# Patient Record
Sex: Male | Born: 1940
Health system: Southern US, Community
[De-identification: ages and names within clinical notes are randomized; demographics above are authoritative.]

## PROBLEM LIST (undated history)

## (undated) DIAGNOSIS — J189 Pneumonia, unspecified organism: Secondary | ICD-10-CM

## (undated) DIAGNOSIS — G4730 Sleep apnea, unspecified: Secondary | ICD-10-CM

## (undated) DIAGNOSIS — N19 Unspecified kidney failure: Secondary | ICD-10-CM

## (undated) DIAGNOSIS — Z5309 Procedure and treatment not carried out because of other contraindication: Secondary | ICD-10-CM

## (undated) DIAGNOSIS — E785 Hyperlipidemia, unspecified: Secondary | ICD-10-CM

## (undated) DIAGNOSIS — E7800 Pure hypercholesterolemia, unspecified: Secondary | ICD-10-CM

## (undated) DIAGNOSIS — N186 End stage renal disease: Secondary | ICD-10-CM

## (undated) DIAGNOSIS — Z862 Personal history of diseases of the blood and blood-forming organs and certain disorders involving the immune mechanism: Secondary | ICD-10-CM

## (undated) DIAGNOSIS — I10 Essential (primary) hypertension: Secondary | ICD-10-CM

## (undated) DIAGNOSIS — R0600 Dyspnea, unspecified: Secondary | ICD-10-CM

## (undated) DIAGNOSIS — K567 Ileus, unspecified: Secondary | ICD-10-CM

## (undated) DIAGNOSIS — I2510 Atherosclerotic heart disease of native coronary artery without angina pectoris: Secondary | ICD-10-CM

## (undated) DIAGNOSIS — J90 Pleural effusion, not elsewhere classified: Secondary | ICD-10-CM

## (undated) DIAGNOSIS — J61 Pneumoconiosis due to asbestos and other mineral fibers: Secondary | ICD-10-CM

## (undated) HISTORY — DX: Ileus, unspecified: K56.7

## (undated) HISTORY — PX: STERNOTOMY: SHX1057

## (undated) HISTORY — PX: PERCUTANEOUS CORONARY STENT INTERVENTION (PCI-S): SHX6016

## (undated) HISTORY — DX: Sleep apnea, unspecified: G47.30

## (undated) HISTORY — PX: CARPAL TUNNEL RELEASE: SHX101

## (undated) HISTORY — DX: Pleural effusion, not elsewhere classified: J90

## (undated) HISTORY — DX: Procedure and treatment not carried out because of other contraindication: Z53.09

## (undated) HISTORY — DX: Unspecified kidney failure: N19

## (undated) HISTORY — DX: Atherosclerotic heart disease of native coronary artery without angina pectoris: I25.10

## (undated) HISTORY — PX: CAROTID ENDARTERECTOMY: SUR193

## (undated) HISTORY — DX: Pneumoconiosis due to asbestos and other mineral fibers: J61

## (undated) HISTORY — DX: Pure hypercholesterolemia, unspecified: E78.00

## (undated) HISTORY — PX: HERNIA REPAIR: SHX51

## (undated) HISTORY — PX: CARDIAC CATHETERIZATION: SHX172

## (undated) HISTORY — PX: BACK SURGERY: SHX140

## (undated) HISTORY — DX: Hyperlipidemia, unspecified: E78.5

## (undated) HISTORY — PX: CORONARY ARTERY BYPASS GRAFT: SHX141

## (undated) HISTORY — DX: Essential (primary) hypertension: I10

---

## 2015-03-16 DIAGNOSIS — I2510 Atherosclerotic heart disease of native coronary artery without angina pectoris: Secondary | ICD-10-CM | POA: Diagnosis not present

## 2015-03-16 DIAGNOSIS — R079 Chest pain, unspecified: Secondary | ICD-10-CM | POA: Diagnosis not present

## 2015-03-16 DIAGNOSIS — I10 Essential (primary) hypertension: Secondary | ICD-10-CM | POA: Diagnosis not present

## 2015-03-16 DIAGNOSIS — J069 Acute upper respiratory infection, unspecified: Secondary | ICD-10-CM | POA: Diagnosis not present

## 2015-03-21 DIAGNOSIS — H2513 Age-related nuclear cataract, bilateral: Secondary | ICD-10-CM | POA: Diagnosis not present

## 2015-03-26 DIAGNOSIS — E039 Hypothyroidism, unspecified: Secondary | ICD-10-CM | POA: Diagnosis not present

## 2015-03-26 DIAGNOSIS — D649 Anemia, unspecified: Secondary | ICD-10-CM | POA: Diagnosis not present

## 2015-03-26 DIAGNOSIS — I10 Essential (primary) hypertension: Secondary | ICD-10-CM | POA: Diagnosis not present

## 2015-03-26 DIAGNOSIS — E785 Hyperlipidemia, unspecified: Secondary | ICD-10-CM | POA: Diagnosis not present

## 2015-03-27 DIAGNOSIS — N183 Chronic kidney disease, stage 3 (moderate): Secondary | ICD-10-CM | POA: Diagnosis not present

## 2015-03-27 DIAGNOSIS — Z951 Presence of aortocoronary bypass graft: Secondary | ICD-10-CM | POA: Diagnosis not present

## 2015-03-27 DIAGNOSIS — J61 Pneumoconiosis due to asbestos and other mineral fibers: Secondary | ICD-10-CM | POA: Diagnosis not present

## 2015-03-27 DIAGNOSIS — G4733 Obstructive sleep apnea (adult) (pediatric): Secondary | ICD-10-CM | POA: Diagnosis not present

## 2015-04-11 DIAGNOSIS — M4806 Spinal stenosis, lumbar region: Secondary | ICD-10-CM | POA: Diagnosis not present

## 2015-04-11 DIAGNOSIS — S335XXD Sprain of ligaments of lumbar spine, subsequent encounter: Secondary | ICD-10-CM | POA: Diagnosis not present

## 2015-04-11 DIAGNOSIS — M5136 Other intervertebral disc degeneration, lumbar region: Secondary | ICD-10-CM | POA: Diagnosis not present

## 2015-04-16 DIAGNOSIS — R531 Weakness: Secondary | ICD-10-CM | POA: Diagnosis not present

## 2015-04-16 DIAGNOSIS — M545 Low back pain: Secondary | ICD-10-CM | POA: Diagnosis not present

## 2015-04-16 DIAGNOSIS — Z4789 Encounter for other orthopedic aftercare: Secondary | ICD-10-CM | POA: Diagnosis not present

## 2015-04-18 DIAGNOSIS — Z4789 Encounter for other orthopedic aftercare: Secondary | ICD-10-CM | POA: Diagnosis not present

## 2015-04-18 DIAGNOSIS — R531 Weakness: Secondary | ICD-10-CM | POA: Diagnosis not present

## 2015-04-18 DIAGNOSIS — M545 Low back pain: Secondary | ICD-10-CM | POA: Diagnosis not present

## 2015-04-19 DIAGNOSIS — E876 Hypokalemia: Secondary | ICD-10-CM | POA: Diagnosis not present

## 2015-04-19 DIAGNOSIS — I129 Hypertensive chronic kidney disease with stage 1 through stage 4 chronic kidney disease, or unspecified chronic kidney disease: Secondary | ICD-10-CM | POA: Diagnosis not present

## 2015-04-19 DIAGNOSIS — N183 Chronic kidney disease, stage 3 (moderate): Secondary | ICD-10-CM | POA: Diagnosis not present

## 2015-04-19 DIAGNOSIS — R809 Proteinuria, unspecified: Secondary | ICD-10-CM | POA: Diagnosis not present

## 2015-04-20 DIAGNOSIS — M545 Low back pain: Secondary | ICD-10-CM | POA: Diagnosis not present

## 2015-04-20 DIAGNOSIS — Z4789 Encounter for other orthopedic aftercare: Secondary | ICD-10-CM | POA: Diagnosis not present

## 2015-04-20 DIAGNOSIS — R531 Weakness: Secondary | ICD-10-CM | POA: Diagnosis not present

## 2015-04-23 DIAGNOSIS — Z4789 Encounter for other orthopedic aftercare: Secondary | ICD-10-CM | POA: Diagnosis not present

## 2015-04-23 DIAGNOSIS — R531 Weakness: Secondary | ICD-10-CM | POA: Diagnosis not present

## 2015-04-23 DIAGNOSIS — M545 Low back pain: Secondary | ICD-10-CM | POA: Diagnosis not present

## 2015-04-25 DIAGNOSIS — M545 Low back pain: Secondary | ICD-10-CM | POA: Diagnosis not present

## 2015-04-25 DIAGNOSIS — Z4789 Encounter for other orthopedic aftercare: Secondary | ICD-10-CM | POA: Diagnosis not present

## 2015-04-25 DIAGNOSIS — R531 Weakness: Secondary | ICD-10-CM | POA: Diagnosis not present

## 2015-04-27 DIAGNOSIS — M545 Low back pain: Secondary | ICD-10-CM | POA: Diagnosis not present

## 2015-04-27 DIAGNOSIS — R531 Weakness: Secondary | ICD-10-CM | POA: Diagnosis not present

## 2015-04-27 DIAGNOSIS — Z4789 Encounter for other orthopedic aftercare: Secondary | ICD-10-CM | POA: Diagnosis not present

## 2015-04-30 DIAGNOSIS — M545 Low back pain: Secondary | ICD-10-CM | POA: Diagnosis not present

## 2015-04-30 DIAGNOSIS — R531 Weakness: Secondary | ICD-10-CM | POA: Diagnosis not present

## 2015-04-30 DIAGNOSIS — Z4789 Encounter for other orthopedic aftercare: Secondary | ICD-10-CM | POA: Diagnosis not present

## 2015-05-02 DIAGNOSIS — Z4789 Encounter for other orthopedic aftercare: Secondary | ICD-10-CM | POA: Diagnosis not present

## 2015-05-02 DIAGNOSIS — M545 Low back pain: Secondary | ICD-10-CM | POA: Diagnosis not present

## 2015-05-02 DIAGNOSIS — R531 Weakness: Secondary | ICD-10-CM | POA: Diagnosis not present

## 2015-05-04 DIAGNOSIS — M545 Low back pain: Secondary | ICD-10-CM | POA: Diagnosis not present

## 2015-05-04 DIAGNOSIS — Z4789 Encounter for other orthopedic aftercare: Secondary | ICD-10-CM | POA: Diagnosis not present

## 2015-05-04 DIAGNOSIS — R531 Weakness: Secondary | ICD-10-CM | POA: Diagnosis not present

## 2015-05-07 DIAGNOSIS — Z4789 Encounter for other orthopedic aftercare: Secondary | ICD-10-CM | POA: Diagnosis not present

## 2015-05-07 DIAGNOSIS — R531 Weakness: Secondary | ICD-10-CM | POA: Diagnosis not present

## 2015-05-07 DIAGNOSIS — M545 Low back pain: Secondary | ICD-10-CM | POA: Diagnosis not present

## 2015-05-09 DIAGNOSIS — R531 Weakness: Secondary | ICD-10-CM | POA: Diagnosis not present

## 2015-05-09 DIAGNOSIS — Z4789 Encounter for other orthopedic aftercare: Secondary | ICD-10-CM | POA: Diagnosis not present

## 2015-05-09 DIAGNOSIS — M545 Low back pain: Secondary | ICD-10-CM | POA: Diagnosis not present

## 2015-05-11 DIAGNOSIS — M545 Low back pain: Secondary | ICD-10-CM | POA: Diagnosis not present

## 2015-05-11 DIAGNOSIS — R531 Weakness: Secondary | ICD-10-CM | POA: Diagnosis not present

## 2015-05-11 DIAGNOSIS — Z4789 Encounter for other orthopedic aftercare: Secondary | ICD-10-CM | POA: Diagnosis not present

## 2015-05-21 DIAGNOSIS — N183 Chronic kidney disease, stage 3 (moderate): Secondary | ICD-10-CM | POA: Diagnosis not present

## 2015-05-28 DIAGNOSIS — H47233 Glaucomatous optic atrophy, bilateral: Secondary | ICD-10-CM | POA: Diagnosis not present

## 2015-06-20 DIAGNOSIS — M4806 Spinal stenosis, lumbar region: Secondary | ICD-10-CM | POA: Diagnosis not present

## 2015-06-20 DIAGNOSIS — M5136 Other intervertebral disc degeneration, lumbar region: Secondary | ICD-10-CM | POA: Diagnosis not present

## 2015-06-20 DIAGNOSIS — S335XXD Sprain of ligaments of lumbar spine, subsequent encounter: Secondary | ICD-10-CM | POA: Diagnosis not present

## 2015-06-26 DIAGNOSIS — M79642 Pain in left hand: Secondary | ICD-10-CM | POA: Diagnosis not present

## 2015-06-26 DIAGNOSIS — M12542 Traumatic arthropathy, left hand: Secondary | ICD-10-CM | POA: Diagnosis not present

## 2015-06-26 DIAGNOSIS — S63413A Traumatic rupture of collateral ligament of left middle finger at metacarpophalangeal and interphalangeal joint, initial encounter: Secondary | ICD-10-CM | POA: Diagnosis not present

## 2015-07-02 DIAGNOSIS — H2513 Age-related nuclear cataract, bilateral: Secondary | ICD-10-CM | POA: Diagnosis not present

## 2015-07-02 DIAGNOSIS — H401132 Primary open-angle glaucoma, bilateral, moderate stage: Secondary | ICD-10-CM | POA: Diagnosis not present

## 2015-07-05 DIAGNOSIS — M19042 Primary osteoarthritis, left hand: Secondary | ICD-10-CM | POA: Diagnosis not present

## 2015-07-06 DIAGNOSIS — J449 Chronic obstructive pulmonary disease, unspecified: Secondary | ICD-10-CM | POA: Diagnosis not present

## 2015-07-15 DIAGNOSIS — M4806 Spinal stenosis, lumbar region: Secondary | ICD-10-CM | POA: Diagnosis not present

## 2015-07-15 DIAGNOSIS — M79604 Pain in right leg: Secondary | ICD-10-CM | POA: Diagnosis not present

## 2015-07-15 DIAGNOSIS — M5432 Sciatica, left side: Secondary | ICD-10-CM | POA: Diagnosis not present

## 2015-07-15 DIAGNOSIS — Z9889 Other specified postprocedural states: Secondary | ICD-10-CM | POA: Diagnosis not present

## 2015-07-15 DIAGNOSIS — Z720 Tobacco use: Secondary | ICD-10-CM | POA: Diagnosis not present

## 2015-07-15 DIAGNOSIS — E7800 Pure hypercholesterolemia, unspecified: Secondary | ICD-10-CM | POA: Diagnosis not present

## 2015-07-15 DIAGNOSIS — I2510 Atherosclerotic heart disease of native coronary artery without angina pectoris: Secondary | ICD-10-CM | POA: Diagnosis not present

## 2015-07-15 DIAGNOSIS — M79605 Pain in left leg: Secondary | ICD-10-CM | POA: Diagnosis not present

## 2015-07-15 DIAGNOSIS — I10 Essential (primary) hypertension: Secondary | ICD-10-CM | POA: Diagnosis not present

## 2015-07-15 DIAGNOSIS — N400 Enlarged prostate without lower urinary tract symptoms: Secondary | ICD-10-CM | POA: Diagnosis not present

## 2015-07-18 DIAGNOSIS — M545 Low back pain: Secondary | ICD-10-CM | POA: Diagnosis not present

## 2015-07-18 DIAGNOSIS — M5432 Sciatica, left side: Secondary | ICD-10-CM | POA: Diagnosis not present

## 2015-07-18 DIAGNOSIS — S335XXD Sprain of ligaments of lumbar spine, subsequent encounter: Secondary | ICD-10-CM | POA: Diagnosis not present

## 2015-07-18 DIAGNOSIS — M5137 Other intervertebral disc degeneration, lumbosacral region: Secondary | ICD-10-CM | POA: Diagnosis not present

## 2015-07-18 DIAGNOSIS — M5136 Other intervertebral disc degeneration, lumbar region: Secondary | ICD-10-CM | POA: Diagnosis not present

## 2015-07-20 DIAGNOSIS — M79605 Pain in left leg: Secondary | ICD-10-CM | POA: Diagnosis not present

## 2015-07-20 DIAGNOSIS — S335XXD Sprain of ligaments of lumbar spine, subsequent encounter: Secondary | ICD-10-CM | POA: Diagnosis not present

## 2015-07-20 DIAGNOSIS — M545 Low back pain: Secondary | ICD-10-CM | POA: Diagnosis not present

## 2015-07-24 DIAGNOSIS — G4733 Obstructive sleep apnea (adult) (pediatric): Secondary | ICD-10-CM | POA: Diagnosis not present

## 2015-07-24 DIAGNOSIS — R079 Chest pain, unspecified: Secondary | ICD-10-CM | POA: Diagnosis not present

## 2015-07-24 DIAGNOSIS — I10 Essential (primary) hypertension: Secondary | ICD-10-CM | POA: Diagnosis not present

## 2015-07-24 DIAGNOSIS — I2510 Atherosclerotic heart disease of native coronary artery without angina pectoris: Secondary | ICD-10-CM | POA: Diagnosis not present

## 2015-07-25 DIAGNOSIS — M5136 Other intervertebral disc degeneration, lumbar region: Secondary | ICD-10-CM | POA: Diagnosis not present

## 2015-07-25 DIAGNOSIS — M5432 Sciatica, left side: Secondary | ICD-10-CM | POA: Diagnosis not present

## 2015-07-25 DIAGNOSIS — S335XXD Sprain of ligaments of lumbar spine, subsequent encounter: Secondary | ICD-10-CM | POA: Diagnosis not present

## 2015-07-25 DIAGNOSIS — M5137 Other intervertebral disc degeneration, lumbosacral region: Secondary | ICD-10-CM | POA: Diagnosis not present

## 2015-08-08 DIAGNOSIS — M5416 Radiculopathy, lumbar region: Secondary | ICD-10-CM | POA: Diagnosis not present

## 2015-08-24 DIAGNOSIS — M5416 Radiculopathy, lumbar region: Secondary | ICD-10-CM | POA: Diagnosis not present

## 2015-09-10 DIAGNOSIS — M5416 Radiculopathy, lumbar region: Secondary | ICD-10-CM | POA: Diagnosis not present

## 2015-10-05 DIAGNOSIS — H2513 Age-related nuclear cataract, bilateral: Secondary | ICD-10-CM | POA: Diagnosis not present

## 2015-10-08 DIAGNOSIS — I10 Essential (primary) hypertension: Secondary | ICD-10-CM | POA: Diagnosis not present

## 2015-10-08 DIAGNOSIS — M109 Gout, unspecified: Secondary | ICD-10-CM | POA: Diagnosis not present

## 2015-10-08 DIAGNOSIS — I2510 Atherosclerotic heart disease of native coronary artery without angina pectoris: Secondary | ICD-10-CM | POA: Diagnosis not present

## 2015-10-10 DIAGNOSIS — R079 Chest pain, unspecified: Secondary | ICD-10-CM | POA: Diagnosis not present

## 2015-10-26 DIAGNOSIS — I10 Essential (primary) hypertension: Secondary | ICD-10-CM | POA: Diagnosis not present

## 2015-10-26 DIAGNOSIS — Z23 Encounter for immunization: Secondary | ICD-10-CM | POA: Diagnosis not present

## 2015-10-26 DIAGNOSIS — I2510 Atherosclerotic heart disease of native coronary artery without angina pectoris: Secondary | ICD-10-CM | POA: Diagnosis not present

## 2015-11-02 DIAGNOSIS — E875 Hyperkalemia: Secondary | ICD-10-CM | POA: Diagnosis not present

## 2015-11-02 DIAGNOSIS — Z951 Presence of aortocoronary bypass graft: Secondary | ICD-10-CM | POA: Diagnosis not present

## 2015-11-02 DIAGNOSIS — I2510 Atherosclerotic heart disease of native coronary artery without angina pectoris: Secondary | ICD-10-CM | POA: Diagnosis not present

## 2015-11-02 DIAGNOSIS — R079 Chest pain, unspecified: Secondary | ICD-10-CM | POA: Diagnosis not present

## 2015-11-05 DIAGNOSIS — R079 Chest pain, unspecified: Secondary | ICD-10-CM | POA: Diagnosis not present

## 2015-11-07 DIAGNOSIS — R079 Chest pain, unspecified: Secondary | ICD-10-CM | POA: Diagnosis not present

## 2015-11-13 DIAGNOSIS — I2510 Atherosclerotic heart disease of native coronary artery without angina pectoris: Secondary | ICD-10-CM | POA: Diagnosis not present

## 2015-11-13 DIAGNOSIS — E7800 Pure hypercholesterolemia, unspecified: Secondary | ICD-10-CM | POA: Diagnosis not present

## 2015-11-13 DIAGNOSIS — N183 Chronic kidney disease, stage 3 (moderate): Secondary | ICD-10-CM | POA: Diagnosis not present

## 2015-11-13 DIAGNOSIS — I10 Essential (primary) hypertension: Secondary | ICD-10-CM | POA: Diagnosis not present

## 2015-11-13 DIAGNOSIS — Z87891 Personal history of nicotine dependence: Secondary | ICD-10-CM | POA: Diagnosis not present

## 2015-11-13 DIAGNOSIS — E782 Mixed hyperlipidemia: Secondary | ICD-10-CM | POA: Diagnosis not present

## 2015-11-13 DIAGNOSIS — I129 Hypertensive chronic kidney disease with stage 1 through stage 4 chronic kidney disease, or unspecified chronic kidney disease: Secondary | ICD-10-CM | POA: Diagnosis not present

## 2015-11-13 DIAGNOSIS — Z955 Presence of coronary angioplasty implant and graft: Secondary | ICD-10-CM | POA: Diagnosis not present

## 2015-11-14 DIAGNOSIS — I10 Essential (primary) hypertension: Secondary | ICD-10-CM | POA: Diagnosis not present

## 2015-11-14 DIAGNOSIS — E782 Mixed hyperlipidemia: Secondary | ICD-10-CM | POA: Diagnosis not present

## 2015-11-14 DIAGNOSIS — N183 Chronic kidney disease, stage 3 (moderate): Secondary | ICD-10-CM | POA: Diagnosis not present

## 2015-11-14 DIAGNOSIS — I129 Hypertensive chronic kidney disease with stage 1 through stage 4 chronic kidney disease, or unspecified chronic kidney disease: Secondary | ICD-10-CM | POA: Diagnosis not present

## 2015-11-14 DIAGNOSIS — I2510 Atherosclerotic heart disease of native coronary artery without angina pectoris: Secondary | ICD-10-CM | POA: Diagnosis not present

## 2015-11-14 DIAGNOSIS — I25118 Atherosclerotic heart disease of native coronary artery with other forms of angina pectoris: Secondary | ICD-10-CM | POA: Diagnosis not present

## 2015-11-14 DIAGNOSIS — E785 Hyperlipidemia, unspecified: Secondary | ICD-10-CM | POA: Diagnosis not present

## 2015-11-14 DIAGNOSIS — E7800 Pure hypercholesterolemia, unspecified: Secondary | ICD-10-CM | POA: Diagnosis not present

## 2015-11-20 DIAGNOSIS — G4733 Obstructive sleep apnea (adult) (pediatric): Secondary | ICD-10-CM | POA: Diagnosis not present

## 2015-11-20 DIAGNOSIS — I129 Hypertensive chronic kidney disease with stage 1 through stage 4 chronic kidney disease, or unspecified chronic kidney disease: Secondary | ICD-10-CM | POA: Diagnosis not present

## 2015-11-20 DIAGNOSIS — N183 Chronic kidney disease, stage 3 (moderate): Secondary | ICD-10-CM | POA: Diagnosis not present

## 2015-11-20 DIAGNOSIS — E875 Hyperkalemia: Secondary | ICD-10-CM | POA: Diagnosis not present

## 2015-11-20 DIAGNOSIS — I10 Essential (primary) hypertension: Secondary | ICD-10-CM | POA: Diagnosis not present

## 2015-11-20 DIAGNOSIS — Z951 Presence of aortocoronary bypass graft: Secondary | ICD-10-CM | POA: Diagnosis not present

## 2015-11-20 DIAGNOSIS — J61 Pneumoconiosis due to asbestos and other mineral fibers: Secondary | ICD-10-CM | POA: Diagnosis not present

## 2015-11-21 DIAGNOSIS — M5416 Radiculopathy, lumbar region: Secondary | ICD-10-CM | POA: Diagnosis not present

## 2015-12-10 DIAGNOSIS — N183 Chronic kidney disease, stage 3 (moderate): Secondary | ICD-10-CM | POA: Diagnosis not present

## 2016-03-11 DIAGNOSIS — M109 Gout, unspecified: Secondary | ICD-10-CM | POA: Diagnosis not present

## 2016-03-11 DIAGNOSIS — E785 Hyperlipidemia, unspecified: Secondary | ICD-10-CM | POA: Diagnosis not present

## 2016-03-11 DIAGNOSIS — N400 Enlarged prostate without lower urinary tract symptoms: Secondary | ICD-10-CM | POA: Diagnosis not present

## 2016-03-11 DIAGNOSIS — H409 Unspecified glaucoma: Secondary | ICD-10-CM | POA: Diagnosis not present

## 2016-03-11 DIAGNOSIS — J61 Pneumoconiosis due to asbestos and other mineral fibers: Secondary | ICD-10-CM | POA: Diagnosis not present

## 2016-03-11 DIAGNOSIS — N183 Chronic kidney disease, stage 3 (moderate): Secondary | ICD-10-CM | POA: Diagnosis not present

## 2016-03-11 DIAGNOSIS — I2510 Atherosclerotic heart disease of native coronary artery without angina pectoris: Secondary | ICD-10-CM | POA: Diagnosis not present

## 2016-03-16 NOTE — Progress Notes (Addendum)
Cardiology Office Note   Date:  03/17/2016   ID:  Peter Oneill, DOB February 26, 1940, MRN [id number]  PCP:  Peter Kid, MD    Chief Complaint  Patient presents with  . Establish Care     Wt Readings from Last 3 Encounters:  03/17/16 213 lb (96.6 kg)       History of Present Illness: Peter Oneill is a 76 y.o. male  Who has had extensive CAD.  She has had CABG in 2003 after having burning in his chest.  A few years later, he required several stents.  Details are not available at this time.  He required repeat CABG in 12/16, with RIMA to LAD and SVG to diagonal.  Last cath was 10/17 in which a stent was placed.  THat cath was done at [facility].    Additional anatomic information from prior records from 2015 cath: LIMA to LAD is atretic. LAD filled by SVG to diagonal prior to second bypass surgery in 2016. SVG to circumflex is occluded. This information comes from a cardiac cath report from 2015. At that time, the RCA was stented with a 3.5 x 15 Xience drug-eluting stent.  He also has a 3.5 x 22 resolute stent in the circumflex.  He had CEA in 1996 and has not had a carotid Doppler in a few years.     He has not had any angina of late.  THey have moved to [place] from [place] recently.    Exercise is limited by [facility] from asbestosis.    No bleeding problems on the Plavix.   Tries to eat healthy.      Past Medical History:  Diagnosis Date  . Asbestosis ([place])   . Contraindication to percutaneous coronary intervention (PCI)   . Coronary artery disease   . Dyslipidemia   . Hypercholesterolemia   . Hypertension   . Ileus ([place])   . Pleural effusion on left   . Renal failure   . Sleep apnea     Past Surgical History:  Procedure Laterality Date  . BACK SURGERY    . CARDIAC CATHETERIZATION    . CAROTID ENDARTERECTOMY    . CARPAL TUNNEL RELEASE    . CORONARY ARTERY BYPASS GRAFT    . HERNIA REPAIR    . PERCUTANEOUS CORONARY STENT INTERVENTION (PCI-S)     multiple  .  STERNOTOMY       Current Outpatient Prescriptions  Medication Sig Dispense Refill  . ALLOPURINOL PO Take 300 mg by mouth daily.     . ASPIRIN EC PO Take 81 mg by mouth daily.     . brimonidine (ALPHAGAN P) 0.1 % SOLN Place 1 drop into both eyes 2 (two) times daily.     Marland Kitchen CARVEDILOL PO Take 12.5 mg by mouth 2 (two) times daily.     . clopidogrel (PLAVIX) 75 MG tablet Take 75 mg by mouth daily.    . dorzolamide-timolol (COSOPT) 22.3-6.8 MG/ML ophthalmic solution Place 1 drop into both eyes 2 (two) times daily.    Marland Kitchen ezetimibe (ZETIA) 10 MG tablet Take 10 mg by mouth daily.    Marland Kitchen latanoprost (XALATAN) 0.005 % ophthalmic solution Place 1 drop into both eyes at bedtime.    . Multiple Vitamin (MULTIVITAMIN) tablet Take 1 tablet by mouth daily.    . Rosuvastatin Calcium (CRESTOR PO) Take 5 mg by mouth daily.     . Silodosin (RAPAFLO PO) Take 8 mg by mouth daily.      No current  facility-administered medications for this visit.     Allergies:   Patient has no known allergies.    Social History:  The patient  reports that he has quit smoking. He has never used smokeless tobacco. He reports that he drinks alcohol. He reports that he does not use drugs.   Family History:  The patient's family history includes Heart disease in his father.    ROS:  Please see the history of present illness.   Otherwise, review of systems are positive for DOE.   All other systems are reviewed and negative.    PHYSICAL EXAM: VS:  BP 136/80   Pulse (!) 55   Ht 5\' 9"  (1.753 m)   Wt 213 lb (96.6 kg)   SpO2 98%   BMI 31.45 kg/m  , BMI Body mass index is 31.45 kg/m. GEN: Well nourished, well developed, in no acute distress  HEENT: normal  Neck: no JVD, carotid bruits, or masses Cardiac: RRR; no murmurs, rubs, or gallops,no edema  Respiratory:  clear to auscultation bilaterally, normal work of breathing GI: soft, nontender, nondistended, + BS MS: no deformity or atrophy  Skin: warm and dry, no rash Neuro:   Strength and sensation are intact Psych: euthymic mood, full affect   EKG:   The ekg ordered today demonstrates NSR, prior inferior MI, no ST segment changes   Recent Labs: No results found for requested labs within last 8760 hours.   Lipid Panel No results found for: CHOL, TRIG, HDL, CHOLHDL, VLDL, LDLCALC, LDLDIRECT   Other studies Reviewed: Additional studies/ records that were reviewed today with results demonstrating: Hospital records from [place] reviewed.  Cr. 1.5 at [place] recently.  .   ASSESSMENT AND PLAN:  1. CAD: Status post CABG 2.  First CABG in 2003. Second CABG in 2016. Most recent PCI was a stent to the mid circumflex with a 3.0 x 12 resolute in September 2017, with kissing balloon to the obtuse marginal which originates from the stented segment. RCA was patent with multiple stents. RIMA to the LAD was patent. Vein graft to the diagonal was patent. It appears that there are 2 vein grafts to the same diagonal. There is also a LIMA to LAD from the first bypass. The 2 latest bypass grafts are the RIMA to LAD and SVG to diagonal.  Continue clopidogrel.  Prescription for supplemental nitroglycerin given. 2. Carotid disease: Status post carotid endarterectomy. We'll plan for carotid duplex. 3. Hyperlipidemia: Continue rosuvastatin. Will check lipids and liver tests today. 4. Chronic renal insufficiency: Creatinine 1.5.   Additional anatomic information from prior records: LIMA to LAD is atretic. LAD filled by SVG to diagonal prior to second bypass surgery in 2016. SVG to circumflex is occluded. This information comes from a cardiac cath report from 2015. At that time, the RCA was stented with a 3.5 x 15 Xience drug-eluting stent.  He also has a 3.5 x 22 resolute stent in the circumflex.  Current medicines are reviewed at length with the patient today.  The patient concerns regarding his medicines were addressed.  The following changes have been made:  No change  Labs/ tests  ordered today include:   Orders Placed This Encounter  Procedures  . EKG 12-Lead    Recommend 150 minutes/week of aerobic exercise Low fat, low carb, high fiber diet recommended  Disposition:   FU in 6 months   Signed, Larae Grooms, MD  03/17/2016 11:55 AM    [facility]  [address] [place], [place]  [address] Phone: [phone]; Fax: [phone]

## 2016-03-17 ENCOUNTER — Encounter: Payer: Self-pay | Admitting: Interventional Cardiology

## 2016-03-17 ENCOUNTER — Ambulatory Visit (INDEPENDENT_AMBULATORY_CARE_PROVIDER_SITE_OTHER): Payer: Medicare Other | Admitting: Interventional Cardiology

## 2016-03-17 VITALS — BP 136/80 | HR 55 | Ht 69.0 in | Wt 213.0 lb

## 2016-03-17 DIAGNOSIS — N183 Chronic kidney disease, stage 3 unspecified: Secondary | ICD-10-CM

## 2016-03-17 DIAGNOSIS — I2510 Atherosclerotic heart disease of native coronary artery without angina pectoris: Secondary | ICD-10-CM | POA: Diagnosis not present

## 2016-03-17 DIAGNOSIS — Z7689 Persons encountering health services in other specified circumstances: Secondary | ICD-10-CM

## 2016-03-17 DIAGNOSIS — E785 Hyperlipidemia, unspecified: Secondary | ICD-10-CM | POA: Insufficient documentation

## 2016-03-17 DIAGNOSIS — E782 Mixed hyperlipidemia: Secondary | ICD-10-CM | POA: Diagnosis not present

## 2016-03-17 DIAGNOSIS — I779 Disorder of arteries and arterioles, unspecified: Secondary | ICD-10-CM | POA: Diagnosis not present

## 2016-03-17 DIAGNOSIS — I739 Peripheral vascular disease, unspecified: Secondary | ICD-10-CM

## 2016-03-17 MED ORDER — NITROGLYCERIN 0.4 MG SL SUBL: 0.4000 mg | SUBLINGUAL_TABLET | SUBLINGUAL | 3 refills | Status: DC | PRN

## 2016-03-17 NOTE — Patient Instructions (Signed)
Medication Instructions:  Same-no changes. RX for Nitroglycerin has been sent to [facility] to fill.  Labwork: Today-Lipids and LFTS  Testing/Procedures: Your physician has requested that you have a carotid duplex. This test is an ultrasound of the carotid arteries in your neck. It looks at blood flow through these arteries that supply the brain with blood. Allow one hour for this exam. There are no restrictions or special instructions.   Follow-Up: Your physician wants you to follow-up in: 6 months. You will receive a reminder letter in the mail two months in advance. If you don't receive a letter, please call our office to schedule the follow-up appointment.     If you need a refill on your cardiac medications before your next appointment, please call your pharmacy.

## 2016-03-18 LAB — HEPATIC FUNCTION PANEL
ALBUMIN: 4 g/dL (ref 3.5–4.8)
ALT: 16 IU/L (ref 0–44)
AST: 20 IU/L (ref 0–40)
Alkaline Phosphatase: 110 IU/L (ref 39–117)
BILIRUBIN TOTAL: 0.4 mg/dL (ref 0.0–1.2)
Bilirubin, Direct: 0.13 mg/dL (ref 0.00–0.40)
TOTAL PROTEIN: 6.6 g/dL (ref 6.0–8.5)

## 2016-03-18 LAB — LIPID PANEL
Chol/HDL Ratio: 3 ratio units (ref 0.0–5.0)
Cholesterol, Total: 125 mg/dL (ref 100–199)
HDL: 41 mg/dL (ref >39)
LDL CALC: 66 mg/dL (ref 0–99)
Triglycerides: 92 mg/dL (ref 0–149)
VLDL Cholesterol Cal: 18 mg/dL (ref 5–40)

## 2016-03-19 ENCOUNTER — Encounter: Payer: Self-pay | Admitting: Interventional Cardiology

## 2016-03-19 ENCOUNTER — Ambulatory Visit (HOSPITAL_COMMUNITY)
Admission: RE | Admit: 2016-03-19 | Discharge: 2016-03-19 | Disposition: A | Discharge status: Home / Self Care | Payer: Medicare Other | Source: Ambulatory Visit | Attending: Cardiovascular Disease | Admitting: Cardiovascular Disease

## 2016-03-19 DIAGNOSIS — I739 Peripheral vascular disease, unspecified: Secondary | ICD-10-CM

## 2016-03-19 DIAGNOSIS — I6523 Occlusion and stenosis of bilateral carotid arteries: Secondary | ICD-10-CM | POA: Insufficient documentation

## 2016-03-19 DIAGNOSIS — I779 Disorder of arteries and arterioles, unspecified: Secondary | ICD-10-CM | POA: Diagnosis not present

## 2016-03-21 DIAGNOSIS — H401131 Primary open-angle glaucoma, bilateral, mild stage: Secondary | ICD-10-CM | POA: Diagnosis not present

## 2016-06-10 DIAGNOSIS — R600 Localized edema: Secondary | ICD-10-CM | POA: Diagnosis not present

## 2016-06-10 DIAGNOSIS — M109 Gout, unspecified: Secondary | ICD-10-CM | POA: Diagnosis not present

## 2016-06-10 DIAGNOSIS — J61 Pneumoconiosis due to asbestos and other mineral fibers: Secondary | ICD-10-CM | POA: Diagnosis not present

## 2016-06-10 DIAGNOSIS — N400 Enlarged prostate without lower urinary tract symptoms: Secondary | ICD-10-CM | POA: Diagnosis not present

## 2016-06-10 DIAGNOSIS — N183 Chronic kidney disease, stage 3 (moderate): Secondary | ICD-10-CM | POA: Diagnosis not present

## 2016-06-10 DIAGNOSIS — E785 Hyperlipidemia, unspecified: Secondary | ICD-10-CM | POA: Diagnosis not present

## 2016-06-13 DIAGNOSIS — H401131 Primary open-angle glaucoma, bilateral, mild stage: Secondary | ICD-10-CM | POA: Diagnosis not present

## 2016-08-19 DIAGNOSIS — J069 Acute upper respiratory infection, unspecified: Secondary | ICD-10-CM | POA: Diagnosis not present

## 2016-08-19 DIAGNOSIS — J61 Pneumoconiosis due to asbestos and other mineral fibers: Secondary | ICD-10-CM | POA: Diagnosis not present

## 2016-09-17 DIAGNOSIS — H401131 Primary open-angle glaucoma, bilateral, mild stage: Secondary | ICD-10-CM | POA: Diagnosis not present

## 2016-10-07 NOTE — Progress Notes (Signed)
Cardiology Office Note   Date:  10/09/2016   ID:  Peter Oneill, DOB 30-Jul-1940, MRN [id number]  PCP:  Dineen Kid, MD    No chief complaint on file. CAD   Wt Readings from Last 3 Encounters:  10/09/16 225 lb 12.8 oz (102.4 kg)  03/17/16 213 lb (96.6 kg)       History of Present Illness: Peter Oneill is a 76 y.o. male   Who has had extensive CAD.  She has had CABG in 2003 after having burning in his chest.  A few years later, he required several stents.  Details are not available at this time.  He required repeat CABG in 12/16, with RIMA to LAD and SVG to diagonal.  Last cath was 10/17 in which a stent was placed.  THat cath was done at [place], [place].    Additional anatomic information from prior records from 2015 cath: LIMA to LAD is atretic. LAD filled by SVG to diagonal prior to second bypass surgery in 2016. SVG to circumflex is occluded. This information comes from a cardiac cath report from 2015. At that time, the RCA was stented with a 3.5 x 15 Xience drug-eluting stent.  He also has a 3.5 x 22 resolute stent in the circumflex.  He had CEA in 1996 and has not had a carotid Doppler in a few years.   Denies : Chest pain. Dizziness. Leg edema. Nitroglycerin use. Orthopnea. Palpitations. Paroxysmal nocturnal dyspnea. Shortness of breath. Syncope.   Walking is limited by orthopedic issues.    Bruises easily.  No bleeding.    Past Medical History:  Diagnosis Date  . Asbestosis ([place])   . Contraindication to percutaneous coronary intervention (PCI)   . Coronary artery disease   . Dyslipidemia   . Hypercholesterolemia   . Hypertension   . Ileus ([place])   . Pleural effusion on left   . Renal failure   . Sleep apnea     Past Surgical History:  Procedure Laterality Date  . BACK SURGERY    . CARDIAC CATHETERIZATION    . CAROTID ENDARTERECTOMY    . CARPAL TUNNEL RELEASE    . CORONARY ARTERY BYPASS GRAFT    . HERNIA REPAIR    . PERCUTANEOUS CORONARY STENT  INTERVENTION (PCI-S)     multiple  . STERNOTOMY       Current Outpatient Prescriptions  Medication Sig Dispense Refill  . ALLOPURINOL PO Take 300 mg by mouth daily.     . ASPIRIN EC PO Take 81 mg by mouth daily.     Marland Kitchen CARVEDILOL PO Take 12.5 mg by mouth 2 (two) times daily.     . clopidogrel (PLAVIX) 75 MG tablet Take 75 mg by mouth daily.    . dorzolamide-timolol (COSOPT) 22.3-6.8 MG/ML ophthalmic solution Place 1 drop into both eyes 2 (two) times daily.    Marland Kitchen ezetimibe (ZETIA) 10 MG tablet Take 10 mg by mouth daily.    . furosemide (LASIX) 20 MG tablet Take 20 mg by mouth as needed.    . latanoprost (XALATAN) 0.005 % ophthalmic solution Place 1 drop into both eyes at bedtime.    . Multiple Vitamin (MULTIVITAMIN) tablet Take 1 tablet by mouth daily.    . nitroGLYCERIN (NITROSTAT) 0.4 MG SL tablet Place 1 tablet (0.4 mg total) under the tongue every 5 (five) minutes as needed for chest pain. 25 tablet 3  . Rosuvastatin Calcium (CRESTOR PO) Take 5 mg by mouth daily.     Marland Kitchen  Silodosin (RAPAFLO PO) Take 8 mg by mouth daily.      No current facility-administered medications for this visit.     Allergies:   Patient has no known allergies.    Social History:  The patient  reports that he has quit smoking. He has never used smokeless tobacco. He reports that he drinks alcohol. He reports that he does not use drugs.   Family History:  The patient's family history includes Heart disease in his father.    ROS:  Please see the history of present illness.   Otherwise, review of systems are positive for easy bruising.   All other systems are reviewed and negative.    PHYSICAL EXAM: VS:  BP 138/70 (BP Location: Right Arm, Patient Position: Sitting, Cuff Size: Normal)   Pulse 99   Ht 5\' 9"  (1.753 m)   Wt 225 lb 12.8 oz (102.4 kg)   SpO2 (!) 64%   BMI 33.34 kg/m  , BMI Body mass index is 33.34 kg/m. GEN: Well nourished, well developed, in no acute distress  HEENT: normal  Neck: no JVD, ;  right carotid bruit is present, no masses Cardiac: RRR; no murmurs, rubs, or gallops,no edema  Respiratory:  clear to auscultation bilaterally, normal work of breathing GI: soft, nontender, nondistended, + BS MS: no deformity or atrophy  Skin: warm and dry, no rash Neuro:  Strength and sensation are intact Psych: euthymic mood, full affect   EKG:   The ekg ordered today demonstrates - none today   Recent Labs: 03/17/2016: ALT 16   Lipid Panel    Component Value Date/Time   CHOL 125 03/17/2016 1226   TRIG 92 03/17/2016 1226   HDL 41 03/17/2016 1226   CHOLHDL 3.0 03/17/2016 1226   LDLCALC 66 03/17/2016 1226     Other studies Reviewed: Additional studies/ records that were reviewed today with results demonstrating: CT of the chest performed March 2018 in [place] revealed coronary calcifications with a normal thoracic aorta. Mild aortic atherosclerosis in the aortic arch. He has multiple bilaterally partially calcified pleural plaques which are stable and reflect asbestos-related pleural disease. There is a trace right pleural effusion. No lung masses or thoracic lymphadenopathy..   ASSESSMENT AND PLAN:  1. CAD: No angina. Activity is limited by orthopedic issues. Continue aggressive medical therapy. 2. Carotid disease: Status post right carotid endarterectomy.  Mild to moderate bilateral carotid disease by ultrasound in January 2018. Will repeat in early 2019 3. Hyperlipidemia: Lipids from April 2018 reviewed. LDL 60, HDL 40, total cholesterol 118, triglycerides 90. Continue current lipid-lowering therapy. 4. CRI: Baseline 1.5 in the past.  Controlled in April, 4/18: 1.46.   Current medicines are reviewed at length with the patient today.  The patient concerns regarding his medicines were addressed.  The following changes have been made:  No change  Labs/ tests ordered today include:  No orders of the defined types were placed in this encounter.   Recommend 150  minutes/week of aerobic exercise Low fat, low carb, high fiber diet recommended  Disposition:   FU in 6 months   Signed, Larae Grooms, MD  10/09/2016 9:43 AM    [facility] [address], [place], [place]  [address] Phone: [phone]; Fax: [phone]

## 2016-10-09 ENCOUNTER — Telehealth: Payer: Self-pay | Admitting: Interventional Cardiology

## 2016-10-09 ENCOUNTER — Encounter: Payer: Self-pay | Admitting: Interventional Cardiology

## 2016-10-09 ENCOUNTER — Ambulatory Visit (INDEPENDENT_AMBULATORY_CARE_PROVIDER_SITE_OTHER): Payer: Medicare Other | Admitting: Interventional Cardiology

## 2016-10-09 VITALS — BP 138/70 | HR 64 | Ht 69.0 in | Wt 225.8 lb

## 2016-10-09 DIAGNOSIS — I779 Disorder of arteries and arterioles, unspecified: Secondary | ICD-10-CM

## 2016-10-09 DIAGNOSIS — I739 Peripheral vascular disease, unspecified: Secondary | ICD-10-CM

## 2016-10-09 DIAGNOSIS — E782 Mixed hyperlipidemia: Secondary | ICD-10-CM

## 2016-10-09 DIAGNOSIS — I209 Angina pectoris, unspecified: Secondary | ICD-10-CM | POA: Diagnosis not present

## 2016-10-09 DIAGNOSIS — N183 Chronic kidney disease, stage 3 unspecified: Secondary | ICD-10-CM

## 2016-10-09 DIAGNOSIS — I25119 Atherosclerotic heart disease of native coronary artery with unspecified angina pectoris: Secondary | ICD-10-CM | POA: Diagnosis not present

## 2016-10-09 NOTE — Telephone Encounter (Signed)
New message    Pt wife is calling to inform nurse of medication mg. She said it's furosemide is 40 mg.

## 2016-10-09 NOTE — Telephone Encounter (Signed)
Spoke with patient's wife. Patient's wife states that the patient is taking furosemide 40 mg QD prn. Med list updated.

## 2016-10-09 NOTE — Patient Instructions (Signed)

## 2016-12-10 ENCOUNTER — Ambulatory Visit (INDEPENDENT_AMBULATORY_CARE_PROVIDER_SITE_OTHER): Payer: Worker's Compensation | Admitting: Pulmonary Disease

## 2016-12-10 ENCOUNTER — Encounter: Payer: Self-pay | Admitting: Pulmonary Disease

## 2016-12-10 VITALS — BP 116/74 | HR 54 | Ht 70.0 in | Wt 220.0 lb

## 2016-12-10 DIAGNOSIS — J61 Pneumoconiosis due to asbestos and other mineral fibers: Secondary | ICD-10-CM | POA: Insufficient documentation

## 2016-12-10 NOTE — Patient Instructions (Addendum)
Discontinue for a high-resolution CT and pulmonary function tests for baseline evaluation of lung function Follow-up in 6 months.

## 2016-12-10 NOTE — Addendum Note (Signed)
Addended by: Parke Poisson E on: 12/10/2016 05:11 PM   Modules accepted: Orders

## 2016-12-10 NOTE — Progress Notes (Signed)
**Note De-Identified Oneill Obfuscation** Peter Oneill    [id number]    [birth year]  Primary Care Physician:Oneill, Peter Bihari, MD  Referring Physician: Dineen Kid, MD [address] [place], [place] [address]  Chief complaint:  Consult for evaluation of asbestosis  HPI: Peter Oneill is a 76 year old with past medical history of coronary artery disease status post CABG, hypertension, OSA, asbestosis  He was in the [employer] for 5 years. 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 He reports significant exposure to asbestos for about 28 years in his line of work. He had a chest x-ray that showed interstitial opacities and after a workup he was given a diagnosis of asbestosis in early 1990s and is on Workmen's Compensation. History noted for an episode of hemoptysis in 2001 which was evaluated by CT chest which did not show any evidence of malignancy. He has history of CPAP obstructive sleep apnea and is on CPAP.  He moved from [place] to [place], [place] to be with his daughter and is here to establish care. He was previously followed by Dr. Donneta Romberg, Pulmonologist in [place]. Chief complaint today is chronic dyspnea on exertion, associated with cough, white mucus. He denies hemoptysis, wheezing, fevers, chills. He is not on any inhalers. He had been tried on history. Quit 40 years ago.  Outpatient Encounter Prescriptions as of 12/10/2016  Medication Sig  . ALLOPURINOL PO Take 300 mg by mouth daily.   . ASPIRIN EC PO Take 81 mg by mouth daily.   Marland Kitchen CARVEDILOL PO Take 12.5 mg by mouth 2 (two) times daily.   . clopidogrel (PLAVIX) 75 MG tablet Take 75 mg by mouth daily.  . dorzolamide-timolol (COSOPT) 22.3-6.8 MG/ML ophthalmic solution Place 1 drop into both eyes 2 (two) times daily.  Marland Kitchen ezetimibe (ZETIA) 10 MG tablet Take 10 mg by mouth daily.  . furosemide (LASIX) 20 MG tablet Take 40 mg by mouth as needed.   . latanoprost (XALATAN) 0.005 % ophthalmic solution Place 1 drop  into both eyes at bedtime.  . Multiple Vitamin (MULTIVITAMIN) tablet Take 1 tablet by mouth daily.  . nitroGLYCERIN (NITROSTAT) 0.4 MG SL tablet Place 1 tablet (0.4 mg total) under the tongue every 5 (five) minutes as needed for chest pain.  . Rosuvastatin Calcium (CRESTOR PO) Take 5 mg by mouth daily.   . Silodosin (RAPAFLO PO) Take 8 mg by mouth daily.    No facility-administered encounter medications on file as of 12/10/2016.     Allergies as of 12/10/2016  . (No Known Allergies)    Past Medical History:  Diagnosis Date  . Asbestosis ([place])   . Contraindication to percutaneous coronary intervention (PCI)   . Coronary artery disease   . Dyslipidemia   . Hypercholesterolemia   . Hypertension   . Ileus ([place])   . Pleural effusion on left   . Renal failure   . Sleep apnea     Past Surgical History:  Procedure Laterality Date  . BACK SURGERY    . CARDIAC CATHETERIZATION    . CAROTID ENDARTERECTOMY    . CARPAL TUNNEL RELEASE    . CORONARY ARTERY BYPASS GRAFT    . HERNIA REPAIR    . PERCUTANEOUS CORONARY STENT INTERVENTION (PCI-S)     multiple  . STERNOTOMY      Family History  Problem Relation Age of Onset  . Heart disease Father     Social History   Social History  .  Marital status: Married    Spouse name: N/A  . Number of children: N/A  . Years of education: N/A   Occupational History  . Not on file.   Social History Main Topics  . Smoking status: Former Smoker    Packs/day: 0.25    Years: 19.00    Types: Cigarettes    Quit date: 12/10/1976  . Smokeless tobacco: Never Used  . Alcohol use Yes     Comment: occasional  . Drug use: No  . Sexual activity: Yes     Comment: married   Other Topics Concern  . Not on file   Social History Narrative  . No narrative on file    Review of systems: Review of Systems  Constitutional: Negative for fever and chills.  HENT: Negative.   Eyes: Negative for blurred vision.  Respiratory: as per HPI    Cardiovascular: Negative for chest pain and palpitations.  Gastrointestinal: Negative for vomiting, diarrhea, blood per rectum. Genitourinary: Negative for dysuria, urgency, frequency and hematuria.  Musculoskeletal: Negative for myalgias, back pain and joint pain.  Skin: Negative for itching and rash.  Neurological: Negative for dizziness, tremors, focal weakness, seizures and loss of consciousness.  Endo/Heme/Allergies: Negative for environmental allergies.  Psychiatric/Behavioral: Negative for depression, suicidal ideas and hallucinations.  All other systems reviewed and are negative.  Physical Exam: Blood pressure 116/74, pulse (!) 54, height 5\' 10"  (1.778 m), weight 220 lb (99.8 kg), SpO2 99 %. Gen:      No acute distress HEENT:  EOMI, sclera anicteric Neck:     No masses; no thyromegaly Lungs:    Scattered basal crackles; normal respiratory effort CV:         Regular rate and rhythm; no murmurs Abd:      + bowel sounds; soft, non-tender; no palpable masses, no distension Ext:    No edema; adequate peripheral perfusion Skin:      Warm and dry; no rash Neuro: alert and oriented x 3 Psych: normal mood and affect  Data Reviewed: PFTs 07/15/11 FVC 2.5 [62%], FEV1 1.91 (62%], /F 76, TLC 73%, diffusion 80%. Mild restrictive lung disease with reduced total lung capacity. Diffusion is low normal. Compared to 2011 there is minimal change in spirometry and a more significant drop in diffusion  CT chest 03/08/03-extensive bilateral calcified pleural plaques. No evidence of interstitial lung disease CT chest 05/24/96-slight interval progression of patient's known bilateral calcified pleural plaques. There is mild degree of bibasilar interstitial fibrosis. Current have the images to review.  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 His CT scan shows extensive calcified  pleural plaques. He'll need to be monitored annually with lung imaging and pulmonary function tests. His last CT scan does not show any interstitial fibrosis. Will revaluate this with high-resolution CT and pulmonary function test.  Plan/Recommendations: - HRCT, PFTs  Marshell Garfinkel MD [facility] Pager [phone] 12/10/2016, 12:15 PM  CC: Peter Kid, MD

## 2016-12-11 ENCOUNTER — Telehealth: Payer: Self-pay | Admitting: Pulmonary Disease

## 2016-12-11 NOTE — Telephone Encounter (Signed)
I have called and lmom to make Peter Oneill aware that the OV note has been faxed to her but there are no results as of yet.  Nothing further is needed.

## 2016-12-16 DIAGNOSIS — H401131 Primary open-angle glaucoma, bilateral, mild stage: Secondary | ICD-10-CM | POA: Diagnosis not present

## 2016-12-18 DIAGNOSIS — Z0000 Encounter for general adult medical examination without abnormal findings: Secondary | ICD-10-CM | POA: Diagnosis not present

## 2016-12-18 DIAGNOSIS — Z23 Encounter for immunization: Secondary | ICD-10-CM | POA: Diagnosis not present

## 2016-12-18 DIAGNOSIS — M109 Gout, unspecified: Secondary | ICD-10-CM | POA: Diagnosis not present

## 2016-12-18 DIAGNOSIS — E785 Hyperlipidemia, unspecified: Secondary | ICD-10-CM | POA: Diagnosis not present

## 2016-12-18 DIAGNOSIS — J61 Pneumoconiosis due to asbestos and other mineral fibers: Secondary | ICD-10-CM | POA: Diagnosis not present

## 2016-12-18 DIAGNOSIS — I10 Essential (primary) hypertension: Secondary | ICD-10-CM | POA: Diagnosis not present

## 2016-12-18 DIAGNOSIS — H409 Unspecified glaucoma: Secondary | ICD-10-CM | POA: Diagnosis not present

## 2016-12-18 DIAGNOSIS — I2510 Atherosclerotic heart disease of native coronary artery without angina pectoris: Secondary | ICD-10-CM | POA: Diagnosis not present

## 2016-12-18 DIAGNOSIS — R7301 Impaired fasting glucose: Secondary | ICD-10-CM | POA: Diagnosis not present

## 2016-12-18 DIAGNOSIS — N400 Enlarged prostate without lower urinary tract symptoms: Secondary | ICD-10-CM | POA: Diagnosis not present

## 2016-12-18 DIAGNOSIS — N183 Chronic kidney disease, stage 3 (moderate): Secondary | ICD-10-CM | POA: Diagnosis not present

## 2016-12-22 ENCOUNTER — Other Ambulatory Visit: Payer: Self-pay | Admitting: Family Medicine

## 2016-12-22 DIAGNOSIS — Z87891 Personal history of nicotine dependence: Secondary | ICD-10-CM

## 2016-12-29 ENCOUNTER — Ambulatory Visit
Admission: RE | Admit: 2016-12-29 | Discharge: 2016-12-29 | Disposition: A | Discharge status: Home / Self Care | Payer: Medicare Other | Source: Ambulatory Visit | Attending: Family Medicine | Admitting: Family Medicine

## 2016-12-29 DIAGNOSIS — Z87891 Personal history of nicotine dependence: Secondary | ICD-10-CM

## 2016-12-29 DIAGNOSIS — Z136 Encounter for screening for cardiovascular disorders: Secondary | ICD-10-CM | POA: Diagnosis not present

## 2017-01-20 DIAGNOSIS — L570 Actinic keratosis: Secondary | ICD-10-CM | POA: Diagnosis not present

## 2017-01-20 DIAGNOSIS — L820 Inflamed seborrheic keratosis: Secondary | ICD-10-CM | POA: Diagnosis not present

## 2017-01-20 DIAGNOSIS — Z23 Encounter for immunization: Secondary | ICD-10-CM | POA: Diagnosis not present

## 2017-01-24 DIAGNOSIS — H8309 Labyrinthitis, unspecified ear: Secondary | ICD-10-CM | POA: Diagnosis not present

## 2017-01-29 DIAGNOSIS — H8112 Benign paroxysmal vertigo, left ear: Secondary | ICD-10-CM | POA: Diagnosis not present

## 2017-03-09 DIAGNOSIS — H401131 Primary open-angle glaucoma, bilateral, mild stage: Secondary | ICD-10-CM | POA: Diagnosis not present

## 2017-03-19 DIAGNOSIS — K644 Residual hemorrhoidal skin tags: Secondary | ICD-10-CM | POA: Diagnosis not present

## 2017-03-19 DIAGNOSIS — K625 Hemorrhage of anus and rectum: Secondary | ICD-10-CM | POA: Diagnosis not present

## 2017-03-19 DIAGNOSIS — K648 Other hemorrhoids: Secondary | ICD-10-CM | POA: Diagnosis not present

## 2017-03-19 DIAGNOSIS — K5900 Constipation, unspecified: Secondary | ICD-10-CM | POA: Diagnosis not present

## 2017-04-01 ENCOUNTER — Encounter: Payer: Self-pay | Admitting: Interventional Cardiology

## 2017-04-08 ENCOUNTER — Ambulatory Visit: Payer: Medicare HMO | Admitting: Interventional Cardiology

## 2017-04-08 ENCOUNTER — Encounter: Payer: Self-pay | Admitting: Interventional Cardiology

## 2017-04-08 VITALS — BP 134/86 | HR 75 | Ht 70.0 in | Wt 230.8 lb

## 2017-04-08 DIAGNOSIS — I25119 Atherosclerotic heart disease of native coronary artery with unspecified angina pectoris: Secondary | ICD-10-CM | POA: Diagnosis not present

## 2017-04-08 DIAGNOSIS — N183 Chronic kidney disease, stage 3 unspecified: Secondary | ICD-10-CM

## 2017-04-08 DIAGNOSIS — E782 Mixed hyperlipidemia: Secondary | ICD-10-CM | POA: Diagnosis not present

## 2017-04-08 DIAGNOSIS — I6523 Occlusion and stenosis of bilateral carotid arteries: Secondary | ICD-10-CM

## 2017-04-08 NOTE — Patient Instructions (Signed)
Medication Instructions:  Your physician recommends that you continue on your current medications as directed. Please refer to the Current Medication list given to you today.   Labwork: None ordered  Testing/Procedures: Your physician has requested that you have a carotid duplex. This test is an ultrasound of the carotid arteries in your neck. It looks at blood flow through these arteries that supply the brain with blood. Allow one hour for this exam. There are no restrictions or special instructions.  Follow-Up: Your physician wants you to follow-up in: October with Dr. Irish Lack. You will receive a reminder letter in the mail two months in advance. If you don't receive a letter, please call our office to schedule the follow-up appointment.   Any Other Special Instructions Will Be Listed Below (If Applicable).     If you need a refill on your cardiac medications before your next appointment, please call your pharmacy.

## 2017-04-08 NOTE — Progress Notes (Signed)
Cardiology Office Note   Date:  04/08/2017   ID:  Peter Oneill, DOB 06-Nov-1940, MRN [id number]  PCP:  Dineen Kid, MD    No chief complaint on file.  CAD  Wt Readings from Last 3 Encounters:  04/08/17 230 lb 12.8 oz (104.7 kg)  12/10/16 220 lb (99.8 kg)  10/09/16 225 lb 12.8 oz (102.4 kg)       History of Present Illness: Peter Oneill is a 77 y.o. male  Who has had extensive CAD. 3.5 x 13 Cypher, and 3.5 x 18 Cypher in 2003.  He then had CABG in 2003 after having burning in his chest. A few years later, he required several stents, (2004; 3.5 x 18 Cypher). Details are not available at this time. He required repeat CABG in 12/16, with RIMA to LAD and SVG to diagonal. Last cath was 10/17 in which a stent was placed. THat cath was done at [place], [place].   Additional anatomic information from prior records from 2015 cath: LIMA to LAD is atretic. LAD filled by SVG to diagonal prior to second bypass surgery in 2016. SVG to circumflex is occluded. This information comes from a cardiac cath report from 2015. At that time in 2015, the RCA was stented with a 3.5 x 12, and 3.0 x 8 Xience drug-eluting stent.  2015 , he had 3.5 x 15 Xience to the SVG to diagonal.  He also has a 3.5 x 22 resolute stent in the circumflex.  3.0 x 12 Onyx in the Circ, 2.5 x 12 in the OM (2017)  He had CEA in 1996 and has not had a carotid Doppler in a few years.    Denies : Chest pain. Dizziness. Leg edema. Nitroglycerin use. Orthopnea. Palpitations. Paroxysmal nocturnal dyspnea. Shortness of breath. Syncope.   He is wondering if he needs a cnother carotid Doppler.    WHen he walks, he feel swell.  He does not get 150 minutes/week.      Past Medical History:  Diagnosis Date  . Asbestosis ([place])   . Contraindication to percutaneous coronary intervention (PCI)   . Coronary artery disease   . Dyslipidemia   . Hypercholesterolemia   . Hypertension   . Ileus ([place])   . Pleural effusion on  left   . Renal failure   . Sleep apnea     Past Surgical History:  Procedure Laterality Date  . BACK SURGERY    . CARDIAC CATHETERIZATION    . CAROTID ENDARTERECTOMY    . CARPAL TUNNEL RELEASE    . CORONARY ARTERY BYPASS GRAFT    . HERNIA REPAIR    . PERCUTANEOUS CORONARY STENT INTERVENTION (PCI-S)     multiple  . STERNOTOMY       Current Outpatient Medications  Medication Sig Dispense Refill  . ALLOPURINOL PO Take 300 mg by mouth daily.     . ASPIRIN EC PO Take 81 mg by mouth daily.     Marland Kitchen CARVEDILOL PO Take 12.5 mg by mouth 2 (two) times daily.     . dorzolamide-timolol (COSOPT) 22.3-6.8 MG/ML ophthalmic solution Place 1 drop into both eyes 2 (two) times daily.    Marland Kitchen ezetimibe (ZETIA) 10 MG tablet Take 10 mg by mouth daily.    . furosemide (LASIX) 20 MG tablet Take 40 mg by mouth as needed.     . latanoprost (XALATAN) 0.005 % ophthalmic solution Place 1 drop into both eyes at bedtime.    . Multiple Vitamin (MULTIVITAMIN) tablet Take  1 tablet by mouth daily.    . nitroGLYCERIN (NITROSTAT) 0.4 MG SL tablet Place 1 tablet (0.4 mg total) under the tongue every 5 (five) minutes as needed for chest pain. 25 tablet 3  . Rosuvastatin Calcium (CRESTOR PO) Take 5 mg by mouth daily.     . Silodosin (RAPAFLO PO) Take 8 mg by mouth daily.      No current facility-administered medications for this visit.     Allergies:   Patient has no known allergies.    Social History:  The patient  reports that he quit smoking about 40 years ago. His smoking use included cigarettes. He has a 4.75 pack-year smoking history. he has never used smokeless tobacco. He reports that he drinks alcohol. He reports that he does not use drugs.   Family History:  The patient's family history includes Heart disease in his father.    ROS:  Please see the history of present illness.   Otherwise, review of systems are positive for hemorrhoids intermittently.   All other systems are reviewed and negative.     PHYSICAL EXAM: VS:  BP 134/86   Pulse 75   Ht 5\' 10"  (1.778 m)   Wt 230 lb 12.8 oz (104.7 kg)   SpO2 97%   BMI 33.12 kg/m  , BMI Body mass index is 33.12 kg/m. GEN: Well nourished, well developed, in no acute distress  HEENT: normal  Neck: no JVD, carotid bruits, or masses Cardiac: RRR; no murmurs, rubs, or gallops,no edema  Respiratory:  clear to auscultation bilaterally, normal work of breathing GI: soft, nontender, nondistended, + BS MS: no deformity or atrophy  Skin: warm and dry, no rash Neuro:  Strength and sensation are intact Psych: euthymic mood, full affect   EKG:   The ekg ordered today demonstrates NSR, anterior T wave inversions, more pronounced than prior ECG   Recent Labs: No results found for requested labs within last 8760 hours.   Lipid Panel    Component Value Date/Time   CHOL 125 03/17/2016 1226   TRIG 92 03/17/2016 1226   HDL 41 03/17/2016 1226   CHOLHDL 3.0 03/17/2016 1226   LDLCALC 66 03/17/2016 1226     Other studies Reviewed: Additional studies/ records that were reviewed today with results demonstrating: .   ASSESSMENT AND PLAN:  1. CAD: Activity limited by ortho issues.  COntinue DAPT given how many stents he has, all documented in the HPI based on his stent cards. 2. Carotid artery disease: Plan for repeat Doppler.  3. Hyperlipidemia: LDL60 in 4/18. Continue lipid lowering therapy.  4. CRI: Cr 1.5 in 10/18   Current medicines are reviewed at length with the patient today.  The patient concerns regarding his medicines were addressed.  The following changes have been made:  No change  Labs/ tests ordered today include:  No orders of the defined types were placed in this encounter.   Recommend 150 minutes/week of aerobic exercise Low fat, low carb, high fiber diet recommended  Disposition:   FU in 8 months   Signed, Larae Grooms, MD  04/08/2017 4:10 PM    [facility] [address],  [place], [place]  [address] Phone: [phone]; Fax: [phone]

## 2017-04-14 ENCOUNTER — Ambulatory Visit (HOSPITAL_COMMUNITY)
Admission: RE | Admit: 2017-04-14 | Discharge: 2017-04-14 | Disposition: A | Discharge status: Home / Self Care | Payer: Medicare HMO | Source: Ambulatory Visit | Attending: Cardiology | Admitting: Cardiology

## 2017-04-14 DIAGNOSIS — I6523 Occlusion and stenosis of bilateral carotid arteries: Secondary | ICD-10-CM

## 2017-04-15 ENCOUNTER — Other Ambulatory Visit: Payer: Self-pay

## 2017-04-15 DIAGNOSIS — I6523 Occlusion and stenosis of bilateral carotid arteries: Secondary | ICD-10-CM

## 2017-05-01 ENCOUNTER — Telehealth: Payer: Self-pay | Admitting: Pulmonary Disease

## 2017-05-01 NOTE — Telephone Encounter (Signed)
Spoke with pt's wife [place] (dpr on file), states that per pt's Mychart pt has been scheduled for a CT chest on 4/18, but nobody has contacted them.  Also, pt is a worker's comp case and they want to ensure that this has been pre-certed appropriately.    PCCs please advise- per chart it looks like Stacey scheduled this.  Thanks!

## 2017-05-01 NOTE — Telephone Encounter (Signed)
I scheduled CT this morning and just hadn't had a chance to call the pt with appt info.  I called & spoke to wife & gave her info - 4/18 at 10:00.  I told her we would verify no issues with Worker's Comp.  Nothing further needed.

## 2017-06-10 DIAGNOSIS — H401131 Primary open-angle glaucoma, bilateral, mild stage: Secondary | ICD-10-CM | POA: Diagnosis not present

## 2017-06-11 ENCOUNTER — Ambulatory Visit (INDEPENDENT_AMBULATORY_CARE_PROVIDER_SITE_OTHER)
Admission: RE | Admit: 2017-06-11 | Discharge: 2017-06-11 | Disposition: A | Discharge status: Home / Self Care | Payer: Worker's Compensation | Source: Ambulatory Visit | Attending: Pulmonary Disease | Admitting: Pulmonary Disease

## 2017-06-11 DIAGNOSIS — J61 Pneumoconiosis due to asbestos and other mineral fibers: Secondary | ICD-10-CM

## 2017-06-23 ENCOUNTER — Encounter: Payer: Self-pay | Admitting: Pulmonary Disease

## 2017-06-23 ENCOUNTER — Ambulatory Visit (INDEPENDENT_AMBULATORY_CARE_PROVIDER_SITE_OTHER): Payer: Worker's Compensation | Admitting: Pulmonary Disease

## 2017-06-23 VITALS — BP 126/82 | HR 60 | Ht 68.0 in | Wt 226.0 lb

## 2017-06-23 DIAGNOSIS — J61 Pneumoconiosis due to asbestos and other mineral fibers: Secondary | ICD-10-CM

## 2017-06-23 LAB — PULMONARY FUNCTION TEST
DL/VA % PRED: 94 %
DL/VA: 4.23 ml/min/mmHg/L
DLCO UNC: 18.07 ml/min/mmHg
DLCO unc % pred: 60 %
FEF 25-75 POST: 1.23 L/s
FEF 25-75 Pre: 0.85 L/sec
FEF2575-%Change-Post: 44 %
FEF2575-%Pred-Post: 61 %
FEF2575-%Pred-Pre: 42 %
FEV1-%CHANGE-POST: 10 %
FEV1-%PRED-POST: 64 %
FEV1-%PRED-PRE: 57 %
FEV1-POST: 1.78 L
FEV1-PRE: 1.61 L
FEV1FVC-%Change-Post: 8 %
FEV1FVC-%PRED-PRE: 92 %
FEV6-%Change-Post: 2 %
FEV6-%PRED-POST: 68 %
FEV6-%PRED-PRE: 66 %
FEV6-POST: 2.47 L
FEV6-Pre: 2.41 L
FEV6FVC-%CHANGE-POST: 0 %
FEV6FVC-%PRED-POST: 107 %
FEV6FVC-%Pred-Pre: 106 %
FVC-%Change-Post: 1 %
FVC-%Pred-Post: 63 %
FVC-%Pred-Pre: 62 %
FVC-POST: 2.47 L
FVC-Pre: 2.43 L
POST FEV6/FVC RATIO: 100 %
PRE FEV1/FVC RATIO: 66 %
PRE FEV6/FVC RATIO: 99 %
Post FEV1/FVC ratio: 72 %
RV % pred: 92 %
RV: 2.28 L
TLC % PRED: 68 %
TLC: 4.56 L

## 2017-06-23 MED ORDER — UMECLIDINIUM-VILANTEROL 62.5-25 MCG/INH IN AEPB: 1.0000 | INHALATION_SPRAY | Freq: Every day | RESPIRATORY_TRACT | 0 refills | Status: AC

## 2017-06-23 NOTE — Progress Notes (Signed)
Peter Oneill    [id number]    1940/06/03  Primary Care Physician:Via, Lennette Bihari, MD  Referring Physician: Dineen Kid, MD [address] [place], [place] [address]  Chief complaint: Follow-up for asbestosis, COPD, emphysema  HPI: Peter Oneill is a 77 year old with past medical history of coronary artery disease status post CABG, hypertension, OSA, asbestosis  He was in the [employer] for 5 years. 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 He reports significant exposure to asbestos for about 28 years in his line of work. He had a chest x-ray that showed interstitial opacities and after a workup he was given a diagnosis of asbestosis in early 1990s and is on Workmen's Compensation. History noted for an episode of hemoptysis in 2001 which was evaluated by CT chest which did not show any evidence of malignancy. He has history of CPAP obstructive sleep apnea and is on CPAP.  He moved from [place] to [place], [place] to be with his daughter and is here to establish care. He was previously followed by Dr. Donneta Romberg, Pulmonologist in [place]. Chief complaint today is chronic dyspnea on exertion, associated with cough, white mucus. He denies hemoptysis, wheezing, fevers, chills. He is not on any inhalers. He had been tried on history. Quit 40 years ago.  Interim history: He is stable with respect to his breathing.  Has chronic dyspnea on exertion, cough with congestion.  Outpatient Encounter Medications as of 06/23/2017  Medication Sig  . ALLOPURINOL PO Take 300 mg by mouth daily.   . ASPIRIN EC PO Take 81 mg by mouth daily.   Marland Kitchen CARVEDILOL PO Take 12.5 mg by mouth 2 (two) times daily.   Marland Kitchen ezetimibe (ZETIA) 10 MG tablet Take 10 mg by mouth daily.  . furosemide (LASIX) 20 MG tablet Take 40 mg by mouth as needed.   . latanoprost (XALATAN) 0.005 % ophthalmic solution Place 1 drop into both eyes at bedtime.  . Multiple Vitamin (MULTIVITAMIN)  tablet Take 1 tablet by mouth daily.  . nitroGLYCERIN (NITROSTAT) 0.4 MG SL tablet Place 1 tablet (0.4 mg total) under the tongue every 5 (five) minutes as needed for chest pain.  . Rosuvastatin Calcium (CRESTOR PO) Take 5 mg by mouth daily.   . Silodosin (RAPAFLO PO) Take 8 mg by mouth daily.   . [DISCONTINUED] dorzolamide-timolol (COSOPT) 22.3-6.8 MG/ML ophthalmic solution Place 1 drop into both eyes 2 (two) times daily.   No facility-administered encounter medications on file as of 06/23/2017.     Allergies as of 06/23/2017  . (No Known Allergies)    Past Medical History:  Diagnosis Date  . Asbestosis ([place])   . Contraindication to percutaneous coronary intervention (PCI)   . Coronary artery disease   . Dyslipidemia   . Hypercholesterolemia   . Hypertension   . Ileus ([place])   . Pleural effusion on left   . Renal failure   . Sleep apnea     Past Surgical History:  Procedure Laterality Date  . BACK SURGERY    . CARDIAC CATHETERIZATION    . CAROTID ENDARTERECTOMY    . CARPAL TUNNEL RELEASE    . CORONARY ARTERY BYPASS GRAFT    . HERNIA REPAIR    . PERCUTANEOUS CORONARY STENT INTERVENTION (PCI-S)     multiple  . STERNOTOMY      Family History  Problem Relation Age of Onset  . Heart disease Father  Social History   Socioeconomic History  . Marital status: Married    Spouse name: Not on file  . Number of children: Not on file  . Years of education: Not on file  . Highest education level: Not on file  Occupational History  . Not on file  Social Needs  . Financial resource strain: Not on file  . Food insecurity:    Worry: Not on file    Inability: Not on file  . Transportation needs:    Medical: Not on file    Non-medical: Not on file  Tobacco Use  . Smoking status: Former Smoker    Packs/day: 0.25    Years: 19.00    Pack years: 4.75    Types: Cigarettes    Last attempt to quit: 12/10/1976    Years since quitting: 40.5  . Smokeless tobacco: Never Used    Substance and Sexual Activity  . Alcohol use: Yes    Comment: occasional  . Drug use: No  . Sexual activity: Yes    Comment: married  Lifestyle  . Physical activity:    Days per week: Not on file    Minutes per session: Not on file  . Stress: Not on file  Relationships  . Social connections:    Talks on phone: Not on file    Gets together: Not on file    Attends religious service: Not on file    Active member of club or organization: Not on file    Attends meetings of clubs or organizations: Not on file    Relationship status: Not on file  . Intimate partner violence:    Fear of current or ex partner: Not on file    Emotionally abused: Not on file    Physically abused: Not on file    Forced sexual activity: Not on file  Other Topics Concern  . Not on file  Social History Narrative  . Not on file    Review of systems: Review of Systems  Constitutional: Negative for fever and chills.  HENT: Negative.   Eyes: Negative for blurred vision.  Respiratory: as per HPI  Cardiovascular: Negative for chest pain and palpitations.  Gastrointestinal: Negative for vomiting, diarrhea, blood per rectum. Genitourinary: Negative for dysuria, urgency, frequency and hematuria.  Musculoskeletal: Negative for myalgias, back pain and joint pain.  Skin: Negative for itching and rash.  Neurological: Negative for dizziness, tremors, focal weakness, seizures and loss of consciousness.  Endo/Heme/Allergies: Negative for environmental allergies.  Psychiatric/Behavioral: Negative for depression, suicidal ideas and hallucinations.  All other systems reviewed and are negative.  Physical Exam: Blood pressure 126/82, pulse 60, height 5\' 8"  (1.727 m), weight 226 lb (102.5 kg), SpO2 97 %. Gen:      No acute distress HEENT:  EOMI, sclera anicteric Neck:     No masses; no thyromegaly Lungs:    Clear to auscultation bilaterally; normal respiratory effort CV:         Regular rate and rhythm; no  murmurs Abd:      + bowel sounds; soft, non-tender; no palpable masses, no distension Ext:    No edema; adequate peripheral perfusion Skin:      Warm and dry; no rash Neuro: alert and oriented x 3 Psych: normal mood and affect  Data Reviewed: Data from [facility] PFTs 07/15/11 FVC 2.5 [62%], FEV1 1.91 (62%], FF 76, TLC 73%, diffusion 80%. Mild restrictive lung disease with reduced total lung capacity. Diffusion is low normal. Compared to 2011 there is  minimal change in spirometry and a more significant drop in diffusion  CT chest 03/08/03-extensive bilateral calcified pleural plaques. No evidence of interstitial lung disease CT chest 05/24/16-slight interval progression of patient's known bilateral calcified pleural plaques. There is mild degree of bibasilar interstitial fibrosis. I do not have the images to review.  Date from [facility] PFTs 06/23/2017 FVC 2.47 (3%], FEV1 1.78 [64%], F/F 72, TLC 68%, RV/TLC 131%, DLCO 60%, DLCO/VA 94% Moderate obstruction, severe restriction with air trapping.  Moderate diffusion impairment  High-resolution CT chest 06/11/2017- extensive calcified pleural plaques, subpleural reticulation, mild emphysema.  Aortic atherosclerosis I reviewed the images personally  Assessment:  Restrictive lung disease secondary to asbestos exposure Asbestosis Pleural plaques Mr. Vetrano has asbestosis from significant asbestos exposure. His CT scan shows extensive calcified pleural plaques. He'll need to be monitored annually with lung imaging and pulmonary function tests.   COPD, emphysema PFTs and CT scan reviewed with moderate obstruction, air trapping and emphysematous changes We will give him a sample of Stiolto.  If there is improvement in symptoms then we can call in prescription.  Plan/Recommendations: - Sample of Stiolto - Continue annual monitoring.  Marshell Garfinkel MD [facility] Pager [phone] 06/23/2017, 12:07 PM  CC: Dineen Kid, MD

## 2017-06-23 NOTE — Patient Instructions (Signed)
We will give you a sample of Anoro inhaler.  Please let us know if this improves your breathing and will call in a prescription Your CT scan and PFTs confirm asbestos lung disease There is also evidence of moderate emphysema and COPD. We will follow back with you in 1 year's time.  Please call us sooner if there is any change in your symptoms.

## 2017-06-23 NOTE — Progress Notes (Signed)
PFT done today. 

## 2017-06-25 ENCOUNTER — Telehealth: Payer: Self-pay | Admitting: Pulmonary Disease

## 2017-06-25 DIAGNOSIS — N400 Enlarged prostate without lower urinary tract symptoms: Secondary | ICD-10-CM | POA: Diagnosis not present

## 2017-06-25 DIAGNOSIS — E785 Hyperlipidemia, unspecified: Secondary | ICD-10-CM | POA: Diagnosis not present

## 2017-06-25 DIAGNOSIS — I2510 Atherosclerotic heart disease of native coronary artery without angina pectoris: Secondary | ICD-10-CM | POA: Diagnosis not present

## 2017-06-25 DIAGNOSIS — H409 Unspecified glaucoma: Secondary | ICD-10-CM | POA: Diagnosis not present

## 2017-06-25 DIAGNOSIS — N183 Chronic kidney disease, stage 3 (moderate): Secondary | ICD-10-CM | POA: Diagnosis not present

## 2017-06-25 DIAGNOSIS — R7303 Prediabetes: Secondary | ICD-10-CM | POA: Diagnosis not present

## 2017-06-25 DIAGNOSIS — I10 Essential (primary) hypertension: Secondary | ICD-10-CM | POA: Diagnosis not present

## 2017-06-25 DIAGNOSIS — M109 Gout, unspecified: Secondary | ICD-10-CM | POA: Diagnosis not present

## 2017-06-25 NOTE — Telephone Encounter (Signed)
Faxed OV note to below fax #. [phone] for Patty to make aware.

## 2017-06-26 NOTE — Telephone Encounter (Signed)
Called Peter Oneill but there was no answer, LM for Peter Oneill to return call.

## 2017-06-29 NOTE — Telephone Encounter (Signed)
Called Patty, unable to reach left message to give [place] a call back. Per triage protocol will close encounter.

## 2017-06-30 ENCOUNTER — Telehealth: Payer: Self-pay | Admitting: Pulmonary Disease

## 2017-06-30 NOTE — Telephone Encounter (Signed)
Called and spoke with Peter Oneill, she asked that we re fax the office notes. Her fax machine was not working and she never got them. Notes were faxed. Nothing further needed.

## 2017-07-02 ENCOUNTER — Telehealth: Payer: Self-pay | Admitting: Pulmonary Disease

## 2017-07-02 MED ORDER — UMECLIDINIUM-VILANTEROL 62.5-25 MCG/INH IN AEPB: 1.0000 | INHALATION_SPRAY | Freq: Every day | RESPIRATORY_TRACT | 5 refills | Status: DC

## 2017-07-02 NOTE — Telephone Encounter (Signed)
Spoke with pt. He is needing a prescription for Anoro sent to his pharmacy. Rx has been sent in. Nothing further was needed.

## 2017-07-08 ENCOUNTER — Telehealth: Payer: Self-pay | Admitting: Pulmonary Disease

## 2017-07-08 NOTE — Telephone Encounter (Signed)
Received a request from [facility] in [place] for a PA on patient's Anoro. Request was started via [email or web address]. Key is [id number].   Per [email or web address], determination could take up to 72 hours. Will route to [facility] for follow up.

## 2017-07-13 NOTE — Telephone Encounter (Signed)
PA still in process per [email or web address]

## 2017-07-15 NOTE — Telephone Encounter (Signed)
Received denial letter from [facility] for [employer].  Dr. Vaughan Browner please advise if you would like to do an appeal?

## 2017-07-17 NOTE — Telephone Encounter (Signed)
Can we find out what is covered by his insurance?

## 2017-07-17 NOTE — Telephone Encounter (Signed)
Spoke with the pt's spouse  She states that she was able to get the Anoro approved through his worker's comp ins  He was able to afford med and nothing further needed

## 2017-10-21 DIAGNOSIS — H401131 Primary open-angle glaucoma, bilateral, mild stage: Secondary | ICD-10-CM | POA: Diagnosis not present

## 2017-11-23 DIAGNOSIS — Z23 Encounter for immunization: Secondary | ICD-10-CM | POA: Diagnosis not present

## 2017-12-30 ENCOUNTER — Other Ambulatory Visit: Payer: Self-pay | Admitting: Pulmonary Disease

## 2018-01-13 DIAGNOSIS — R7303 Prediabetes: Secondary | ICD-10-CM | POA: Diagnosis not present

## 2018-01-13 DIAGNOSIS — N183 Chronic kidney disease, stage 3 (moderate): Secondary | ICD-10-CM | POA: Diagnosis not present

## 2018-01-18 DIAGNOSIS — Z0000 Encounter for general adult medical examination without abnormal findings: Secondary | ICD-10-CM | POA: Diagnosis not present

## 2018-01-18 DIAGNOSIS — J61 Pneumoconiosis due to asbestos and other mineral fibers: Secondary | ICD-10-CM | POA: Diagnosis not present

## 2018-01-18 DIAGNOSIS — H409 Unspecified glaucoma: Secondary | ICD-10-CM | POA: Diagnosis not present

## 2018-01-18 DIAGNOSIS — N183 Chronic kidney disease, stage 3 (moderate): Secondary | ICD-10-CM | POA: Diagnosis not present

## 2018-01-18 DIAGNOSIS — Z1389 Encounter for screening for other disorder: Secondary | ICD-10-CM | POA: Diagnosis not present

## 2018-01-18 DIAGNOSIS — Z6835 Body mass index (BMI) 35.0-35.9, adult: Secondary | ICD-10-CM | POA: Diagnosis not present

## 2018-01-18 DIAGNOSIS — I10 Essential (primary) hypertension: Secondary | ICD-10-CM | POA: Diagnosis not present

## 2018-01-18 DIAGNOSIS — M109 Gout, unspecified: Secondary | ICD-10-CM | POA: Diagnosis not present

## 2018-01-18 DIAGNOSIS — E785 Hyperlipidemia, unspecified: Secondary | ICD-10-CM | POA: Diagnosis not present

## 2018-01-18 DIAGNOSIS — N400 Enlarged prostate without lower urinary tract symptoms: Secondary | ICD-10-CM | POA: Diagnosis not present

## 2018-01-18 DIAGNOSIS — R7303 Prediabetes: Secondary | ICD-10-CM | POA: Diagnosis not present

## 2018-01-26 NOTE — Progress Notes (Signed)
Cardiology Office Note   Date:  01/27/2018   ID:  Peter Oneill, DOB October 18, 1940, MRN [id number]  PCP:  Peter Kid, MD    No chief complaint on file.  CAD  Wt Readings from Last 3 Encounters:  01/27/18 227 lb 12.8 oz (103.3 kg)  06/23/17 226 lb (102.5 kg)  04/08/17 230 lb 12.8 oz (104.7 kg)       History of Present Illness: Peter Oneill is a 77 y.o. male   Who has had extensive CAD. 3.5 x 13 Cypher, and 3.5 x 18 Cypher in 2003.  He then had CABG in 2003 after having burning in his chest. A few years later, he required several stents, (2004; 3.5 x 18 Cypher). Details are not available at this time. He required repeat CABG in 12/16, with RIMA to LAD and SVG to diagonal. Last cath was 10/17 in which a stent was placed. THat cath was done at [place], [place].   Additional anatomic information from prior records from 2015 cath: LIMA to LAD is atretic. LAD filled by SVG to diagonal prior to second bypass surgery in 2016. SVG to circumflex is occluded. This information comes from a cardiac cath report from 2015. At that time in 2015, the RCA was stented with a 3.5 x 12, and 3.0 x 8 Xience drug-eluting stent.  2015 , he had 3.5 x 15 Xience to the SVG to diagonal.  He also has a 3.5 x 22 resolute stent in the circumflex.  3.0 x 12 Onyx in the Circ, 2.5 x 12 in the OM (2017)  He had CEA in 1996   Since the last visit, he has ben doing well.    Denies : Chest pain. Dizziness. Leg edema. Nitroglycerin use. Orthopnea. Palpitations. Paroxysmal nocturnal dyspnea. Shortness of breath. Syncope.   He stays active.  He is not doing regular exercise.   No bleeding problems.     Past Medical History:  Diagnosis Date  . Asbestosis ([place])   . Contraindication to percutaneous coronary intervention (PCI)   . Coronary artery disease   . Dyslipidemia   . Hypercholesterolemia   . Hypertension   . Ileus ([place])   . Pleural effusion on left   . Renal failure   . Sleep apnea      Past Surgical History:  Procedure Laterality Date  . BACK SURGERY    . CARDIAC CATHETERIZATION    . CAROTID ENDARTERECTOMY    . CARPAL TUNNEL RELEASE    . CORONARY ARTERY BYPASS GRAFT    . HERNIA REPAIR    . PERCUTANEOUS CORONARY STENT INTERVENTION (PCI-S)     multiple  . STERNOTOMY       Current Outpatient Medications  Medication Sig Dispense Refill  . allopurinol (ZYLOPRIM) 300 MG tablet Take 300 mg by mouth daily.    Peter Oneill 62.5-25 MCG/INH AEPB INHALE 1 PUFF INTO THE LUNGS DAILY 60 each 0  . aspirin EC 81 MG tablet Take 81 mg by mouth daily.    . carvedilol (COREG) 12.5 MG tablet Take 12.5 mg by mouth 2 (two) times daily with a meal.    . clopidogrel (PLAVIX) 75 MG tablet Take 75 mg by mouth daily.    Marland Kitchen ezetimibe (ZETIA) 10 MG tablet Take 10 mg by mouth daily.    . furosemide (LASIX) 20 MG tablet Take 40 mg by mouth as needed.     . latanoprost (XALATAN) 0.005 % ophthalmic solution Place 1 drop into both eyes at bedtime.    Marland Kitchen  Multiple Vitamin (MULTIVITAMIN) tablet Take 1 tablet by mouth daily.    . nitroGLYCERIN (NITROSTAT) 0.4 MG SL tablet Place 1 tablet (0.4 mg total) under the tongue every 5 (five) minutes as needed for chest pain. 25 tablet 3  . rosuvastatin (CRESTOR) 5 MG tablet Take 5 mg by mouth daily.    . Silodosin (RAPAFLO PO) Take 8 mg by mouth daily.      No current facility-administered medications for this visit.     Allergies:   Patient has no known allergies.    Social History:  The patient  reports that he quit smoking about 41 years ago. His smoking use included cigarettes. He has a 4.75 pack-year smoking history. He has never used smokeless tobacco. He reports that he drinks alcohol. He reports that he does not use drugs.   Family History:  The patient's family history includes Heart disease in his father.    ROS:  Please see the history of present illness.   Otherwise, review of systems are positive for chronic SHOB due to lung disease.    All other systems are reviewed and negative.    PHYSICAL EXAM: VS:  BP 134/74   Pulse (!) 59   Ht 5\' 8"  (1.727 m)   Wt 227 lb 12.8 oz (103.3 kg)   SpO2 96%   BMI 34.64 kg/m  , BMI Body mass index is 34.64 kg/m. GEN: Well nourished, well developed, in no acute distress  HEENT: normal  Neck: no JVD, carotid bruits, or masses Cardiac: RRR; no murmurs, rubs, or gallops,no edema  Respiratory:  clear to auscultation bilaterally, normal work of breathing GI: soft, nontender, nondistended, + BS, obese MS: no deformity or atrophy  Skin: warm and dry, no rash Neuro:  Strength and sensation are intact Psych: euthymic mood, full affect   EKG:   The ekg ordered today demonstrates normal sinus rhythm, nonspecific ST segment changes   Recent Labs: No results found for requested labs within last 8760 hours.   Lipid Panel    Component Value Date/Time   CHOL 125 03/17/2016 1226   TRIG 92 03/17/2016 1226   HDL 41 03/17/2016 1226   CHOLHDL 3.0 03/17/2016 1226   LDLCALC 66 03/17/2016 1226     Other studies Reviewed: Additional studies/ records that were reviewed today with results demonstrating: Labs reviewed.   ASSESSMENT AND PLAN:  1. CAD: DAPT longterm given number of stents he has had.  No angina.   I encouraged more exercise.  A recumbent bike would be a reasonable choice.  We also spoke about minimizing sugar in the diet to help his overall heart health. 2. Carotid artery disease: s/p CEA. 3. Hyperlipidemia: LDL 56 in 5/19.   4. CRI: Cr 1.68 in 11/19.     Current medicines are reviewed at length with the patient today.  The patient concerns regarding his medicines were addressed.  The following changes have been made:  No change  Labs/ tests ordered today include:  No orders of the defined types were placed in this encounter.   Recommend 150 minutes/week of aerobic exercise Low fat, low carb, high fiber diet recommended  Disposition:   FU in 1  year   Signed, Larae Grooms, MD  01/27/2018 10:42 AM    [facility] [place], [place], [place]  [address] Phone: [phone]; Fax: [phone]

## 2018-01-27 ENCOUNTER — Encounter: Payer: Self-pay | Admitting: Interventional Cardiology

## 2018-01-27 ENCOUNTER — Ambulatory Visit: Payer: Medicare HMO | Admitting: Interventional Cardiology

## 2018-01-27 VITALS — BP 134/74 | HR 59 | Ht 68.0 in | Wt 227.8 lb

## 2018-01-27 DIAGNOSIS — E782 Mixed hyperlipidemia: Secondary | ICD-10-CM

## 2018-01-27 DIAGNOSIS — I25119 Atherosclerotic heart disease of native coronary artery with unspecified angina pectoris: Secondary | ICD-10-CM

## 2018-01-27 DIAGNOSIS — N183 Chronic kidney disease, stage 3 unspecified: Secondary | ICD-10-CM

## 2018-01-27 DIAGNOSIS — I6523 Occlusion and stenosis of bilateral carotid arteries: Secondary | ICD-10-CM

## 2018-01-27 NOTE — Patient Instructions (Signed)

## 2018-02-02 ENCOUNTER — Other Ambulatory Visit: Payer: Self-pay | Admitting: Pulmonary Disease

## 2018-03-08 ENCOUNTER — Telehealth: Payer: Self-pay | Admitting: Pulmonary Disease

## 2018-03-08 MED ORDER — UMECLIDINIUM-VILANTEROL 62.5-25 MCG/INH IN AEPB: INHALATION_SPRAY | RESPIRATORY_TRACT | 3 refills | Status: DC

## 2018-03-08 NOTE — Telephone Encounter (Signed)
Spoke with [place]. She stated that the patient needs a refill on Anoro. RX will need to be sent to [facility] in [place]. Advised her that I would send in enough RXs until his next appt in April. She verbalized understanding.

## 2018-03-10 DIAGNOSIS — H43812 Vitreous degeneration, left eye: Secondary | ICD-10-CM | POA: Diagnosis not present

## 2018-03-10 DIAGNOSIS — H401131 Primary open-angle glaucoma, bilateral, mild stage: Secondary | ICD-10-CM | POA: Diagnosis not present

## 2018-03-11 ENCOUNTER — Telehealth: Payer: Self-pay | Admitting: Pulmonary Disease

## 2018-03-11 NOTE — Telephone Encounter (Signed)
Spoke with Peter Oneill at [employer] needs PA  They tried running it through [employer] as usual and it is showing PA in progress  Called the number she provided for [employer] ins Pt ID- [id number] She states PA is still in progress and will be for the next 24-48 hours  Will call back to check on this

## 2018-03-12 NOTE — Telephone Encounter (Signed)
Called and spoke with Randall Hiss, Software engineer at [employer].  He stated that the Anoro went through.  Left detailed message on home phone (on  DPR) to let him know Anoro went through. Nothing further at this time.

## 2018-03-17 DIAGNOSIS — M25512 Pain in left shoulder: Secondary | ICD-10-CM | POA: Diagnosis not present

## 2018-03-30 ENCOUNTER — Ambulatory Visit (HOSPITAL_COMMUNITY)
Admission: RE | Admit: 2018-03-30 | Discharge: 2018-03-30 | Disposition: A | Discharge status: Home / Self Care | Payer: Medicare HMO | Source: Ambulatory Visit | Attending: Cardiology | Admitting: Cardiology

## 2018-03-30 DIAGNOSIS — I6523 Occlusion and stenosis of bilateral carotid arteries: Secondary | ICD-10-CM | POA: Diagnosis not present

## 2018-04-01 ENCOUNTER — Telehealth: Payer: Self-pay

## 2018-04-01 DIAGNOSIS — I6523 Occlusion and stenosis of bilateral carotid arteries: Secondary | ICD-10-CM

## 2018-04-01 NOTE — Telephone Encounter (Signed)
-----   Message from [name], MD sent at 04/01/2018 11:04 AM EST ----- Moderate carotid disease.  F/u in 1 year.

## 2018-04-01 NOTE — Telephone Encounter (Signed)
The patient has been notified of the result and verbalized understanding.  All questions (if any) were answered. Repeat study ordered for 1 year.  Cleon Gustin, RN 04/01/2018 1:56 PM

## 2018-04-20 DIAGNOSIS — M545 Low back pain: Secondary | ICD-10-CM | POA: Diagnosis not present

## 2018-04-26 DIAGNOSIS — J61 Pneumoconiosis due to asbestos and other mineral fibers: Secondary | ICD-10-CM | POA: Diagnosis not present

## 2018-04-26 DIAGNOSIS — E6609 Other obesity due to excess calories: Secondary | ICD-10-CM | POA: Diagnosis not present

## 2018-04-26 DIAGNOSIS — I2510 Atherosclerotic heart disease of native coronary artery without angina pectoris: Secondary | ICD-10-CM | POA: Diagnosis not present

## 2018-04-26 DIAGNOSIS — M109 Gout, unspecified: Secondary | ICD-10-CM | POA: Diagnosis not present

## 2018-04-26 DIAGNOSIS — I252 Old myocardial infarction: Secondary | ICD-10-CM | POA: Diagnosis not present

## 2018-04-26 DIAGNOSIS — I509 Heart failure, unspecified: Secondary | ICD-10-CM | POA: Diagnosis not present

## 2018-04-26 DIAGNOSIS — Z136 Encounter for screening for cardiovascular disorders: Secondary | ICD-10-CM | POA: Diagnosis not present

## 2018-04-26 DIAGNOSIS — I110 Hypertensive heart disease with heart failure: Secondary | ICD-10-CM | POA: Diagnosis not present

## 2018-04-26 DIAGNOSIS — E785 Hyperlipidemia, unspecified: Secondary | ICD-10-CM | POA: Diagnosis not present

## 2018-04-26 DIAGNOSIS — H409 Unspecified glaucoma: Secondary | ICD-10-CM | POA: Diagnosis not present

## 2018-05-28 ENCOUNTER — Other Ambulatory Visit: Payer: Self-pay

## 2018-05-28 MED ORDER — UMECLIDINIUM-VILANTEROL 62.5-25 MCG/INH IN AEPB: INHALATION_SPRAY | RESPIRATORY_TRACT | 3 refills | Status: DC

## 2018-06-24 ENCOUNTER — Ambulatory Visit: Payer: Self-pay | Admitting: Pulmonary Disease

## 2018-07-01 DIAGNOSIS — H43812 Vitreous degeneration, left eye: Secondary | ICD-10-CM | POA: Diagnosis not present

## 2018-07-01 DIAGNOSIS — H401131 Primary open-angle glaucoma, bilateral, mild stage: Secondary | ICD-10-CM | POA: Diagnosis not present

## 2018-07-15 DIAGNOSIS — I2510 Atherosclerotic heart disease of native coronary artery without angina pectoris: Secondary | ICD-10-CM | POA: Diagnosis not present

## 2018-07-15 DIAGNOSIS — E785 Hyperlipidemia, unspecified: Secondary | ICD-10-CM | POA: Diagnosis not present

## 2018-07-15 DIAGNOSIS — I10 Essential (primary) hypertension: Secondary | ICD-10-CM | POA: Diagnosis not present

## 2018-07-15 DIAGNOSIS — H409 Unspecified glaucoma: Secondary | ICD-10-CM | POA: Diagnosis not present

## 2018-07-15 DIAGNOSIS — N183 Chronic kidney disease, stage 3 (moderate): Secondary | ICD-10-CM | POA: Diagnosis not present

## 2018-07-15 DIAGNOSIS — N400 Enlarged prostate without lower urinary tract symptoms: Secondary | ICD-10-CM | POA: Diagnosis not present

## 2018-07-20 ENCOUNTER — Telehealth: Payer: Self-pay | Admitting: Pulmonary Disease

## 2018-07-20 NOTE — Telephone Encounter (Signed)
Medication name and strength: Anoro Ellipta 62.5 Inhaler Provider: PM Pharmacy: [facility] Patient insurance ID: [facility] Phone: [phone]  Was the PA started on CMM?  Online today 07/20/18 If yes, please enter the Key: Key: [facility] - Rx #: [id number]  Timeframe for approval/denial: 4-6 days for determination

## 2018-07-28 NOTE — Telephone Encounter (Signed)
Received fax from [place] stating that the Anoro did not need a PA and is on the formulary.   Pharmacy is aware. Nothing further needed at time of call.

## 2018-07-28 NOTE — Telephone Encounter (Signed)
Reviewed PA information online. PA was never sent to plan due to the insurance not having a matching patient in their records. After reviewing, it appears [facility] information was used on the [employer] form.   New Key on [email or web address] is [id number]. Will receive determination in 24-72 hours.

## 2018-08-05 DIAGNOSIS — N183 Chronic kidney disease, stage 3 (moderate): Secondary | ICD-10-CM | POA: Diagnosis not present

## 2018-08-05 DIAGNOSIS — E785 Hyperlipidemia, unspecified: Secondary | ICD-10-CM | POA: Diagnosis not present

## 2018-08-05 DIAGNOSIS — J61 Pneumoconiosis due to asbestos and other mineral fibers: Secondary | ICD-10-CM | POA: Diagnosis not present

## 2018-08-05 DIAGNOSIS — I10 Essential (primary) hypertension: Secondary | ICD-10-CM | POA: Diagnosis not present

## 2018-08-05 DIAGNOSIS — J439 Emphysema, unspecified: Secondary | ICD-10-CM | POA: Diagnosis not present

## 2018-08-05 DIAGNOSIS — N400 Enlarged prostate without lower urinary tract symptoms: Secondary | ICD-10-CM | POA: Diagnosis not present

## 2018-08-05 DIAGNOSIS — I2510 Atherosclerotic heart disease of native coronary artery without angina pectoris: Secondary | ICD-10-CM | POA: Diagnosis not present

## 2018-08-05 DIAGNOSIS — R7303 Prediabetes: Secondary | ICD-10-CM | POA: Diagnosis not present

## 2018-08-05 DIAGNOSIS — Z77090 Contact with and (suspected) exposure to asbestos: Secondary | ICD-10-CM | POA: Diagnosis not present

## 2018-08-05 DIAGNOSIS — R600 Localized edema: Secondary | ICD-10-CM | POA: Diagnosis not present

## 2018-08-23 ENCOUNTER — Ambulatory Visit (INDEPENDENT_AMBULATORY_CARE_PROVIDER_SITE_OTHER): Payer: Worker's Compensation | Admitting: Nurse Practitioner

## 2018-08-23 ENCOUNTER — Encounter: Payer: Self-pay | Admitting: Nurse Practitioner

## 2018-08-23 ENCOUNTER — Other Ambulatory Visit: Payer: Self-pay

## 2018-08-23 DIAGNOSIS — J61 Pneumoconiosis due to asbestos and other mineral fibers: Secondary | ICD-10-CM

## 2018-08-23 MED ORDER — ANORO ELLIPTA 62.5-25 MCG/INH IN AEPB: INHALATION_SPRAY | RESPIRATORY_TRACT | 3 refills | Status: DC

## 2018-08-23 NOTE — Patient Instructions (Signed)
Continue Anoro Stay active Keep follow up as scheduled with cardiology  Follow up: Follow up with Dr. Vaughan Browner in 1 year or sooner if needed

## 2018-08-23 NOTE — Assessment & Plan Note (Signed)
Patient states that this is been a stable interval for him.  He states that Anoro has improved his cough and shortness of breath.  He does still have occasional cough in the mornings before using his inhaler.  He is reluctant to try any reflux medications at this time.  Patient Instructions  Continue Anoro Stay active Keep follow up as scheduled with cardiology  Follow up: Follow up with Dr. Vaughan Browner in 1 year or sooner if needed

## 2018-08-23 NOTE — Progress Notes (Signed)
@Patient  ID: Peter Oneill, male    DOB: 1941/01/09, 78 y.o.   MRN: [id number]  No chief complaint on file.   Referring provider: ViaLennette Bihari, MD  HPI  78 year old male with asbestosis, COPD, emphysema and OSA who is followed by Dr. Vaughan Browner. PMH: CAD, status post CABG, hypertension  Maintenance: Anoro  Tests:  Data from [place] PFTs 07/15/11 FVC 2.5 [62%], FEV1 1.91 (62%], FF 76, TLC 73%, diffusion 80%. Mild restrictive lung disease with reduced total lung capacity. Diffusion is low normal. Compared to 2011 there is minimal change in spirometry and a more significant drop in diffusion  CT chest 03/08/03-extensive bilateral calcified pleural plaques. No evidence of interstitial lung disease CT chest 05/24/16-slight interval progression of patient's known bilateral calcified pleural plaques. There is mild degree of bibasilar interstitial fibrosis. I do not have the images to review.  Data from [place] PFTs 06/23/2017 FVC 2.47 (3%], FEV1 1.78 [64%], F/F 72, TLC 68%, RV/TLC 131%, DLCO 60%, DLCO/VA 94% Moderate obstruction, severe restriction with air trapping.  Moderate diffusion impairment  High-resolution CT chest 06/11/2017- extensive calcified pleural plaques, subpleural reticulation, mild emphysema.  Aortic atherosclerosis   OV 08/23/18 - Follow up: Patient presents today for follow-up visit.  He was last seen by Dr. Vaughan Browner on 06/23/2017.  He was started on Anoro Ellipta at that visit.  States that this has helped with his breathing and cough.  States that this is been a stable interval for him.  He does still complain of ongoing cough in the mornings before he uses his inhaler.  He states that his cough is productive of white sputum at times.  He has not had any significant exacerbations since last visit.  He does follow with cardiology and is on Lasix.  He denies any recent lower extremity edema.  Denies f/c/s, n/v/d, hemoptysis, PND, leg swelling.    No Known Allergies   There is no  immunization history on file for this patient.  Past Medical History:  Diagnosis Date  . Asbestosis ([place])   . Contraindication to percutaneous coronary intervention (PCI)   . Coronary artery disease   . Dyslipidemia   . Hypercholesterolemia   . Hypertension   . Ileus ([place])   . Pleural effusion on left   . Renal failure   . Sleep apnea     Tobacco History: Social History   Tobacco Use  Smoking Status Former Smoker  . Packs/day: 0.25  . Years: 19.00  . Pack years: 4.75  . Types: Cigarettes  . Quit date: 12/10/1976  . Years since quitting: 41.7  Smokeless Tobacco Never Used   Counseling given: Not Answered   Outpatient Encounter Medications as of 08/23/2018  Medication Sig  . allopurinol (ZYLOPRIM) 300 MG tablet Take 300 mg by mouth daily.  Marland Kitchen aspirin EC 81 MG tablet Take 81 mg by mouth daily.  . carvedilol (COREG) 12.5 MG tablet Take 12.5 mg by mouth 2 (two) times daily with a meal.  . clopidogrel (PLAVIX) 75 MG tablet Take 75 mg by mouth daily.  Marland Kitchen ezetimibe (ZETIA) 10 MG tablet Take 10 mg by mouth daily.  . furosemide (LASIX) 20 MG tablet Take 40 mg by mouth as needed.   . latanoprost (XALATAN) 0.005 % ophthalmic solution Place 1 drop into both eyes at bedtime.  . Multiple Vitamin (MULTIVITAMIN) tablet Take 1 tablet by mouth daily.  . nitroGLYCERIN (NITROSTAT) 0.4 MG SL tablet Place 1 tablet (0.4 mg total) under the tongue every 5 (five) minutes as needed  for chest pain.  . rosuvastatin (CRESTOR) 5 MG tablet Take 5 mg by mouth daily.  . Silodosin (RAPAFLO PO) Take 8 mg by mouth daily.   Marland Kitchen umeclidinium-vilanterol (ANORO ELLIPTA) 62.5-25 MCG/INH AEPB INHALE 1 PUFF INTO THE LUNGS DAILY  . [DISCONTINUED] umeclidinium-vilanterol (ANORO ELLIPTA) 62.5-25 MCG/INH AEPB INHALE 1 PUFF INTO THE LUNGS DAILY   No facility-administered encounter medications on file as of 08/23/2018.      Review of Systems  Review of Systems  Constitutional: Negative.  Negative for chills and  fever.  HENT: Negative.   Respiratory: Positive for cough. Negative for shortness of breath and wheezing.   Cardiovascular: Negative.  Negative for chest pain, palpitations and leg swelling.  Gastrointestinal: Negative.   Allergic/Immunologic: Negative.   Neurological: Negative.   Psychiatric/Behavioral: Negative.        Physical Exam  BP (!) 150/70 (BP Location: Left Arm, Patient Position: Sitting, Cuff Size: Normal)   Pulse 66   Temp 97.6 F (36.4 C)   Ht 5\' 9"  (1.753 m)   Wt 228 lb 3.2 oz (103.5 kg)   SpO2 100%   BMI 33.70 kg/m   Wt Readings from Last 5 Encounters:  08/23/18 228 lb 3.2 oz (103.5 kg)  01/27/18 227 lb 12.8 oz (103.3 kg)  06/23/17 226 lb (102.5 kg)  04/08/17 230 lb 12.8 oz (104.7 kg)  12/10/16 220 lb (99.8 kg)     Physical Exam Vitals signs and nursing note reviewed.  Constitutional:      General: He is not in acute distress.    Appearance: He is well-developed.  Cardiovascular:     Rate and Rhythm: Normal rate and regular rhythm.  Pulmonary:     Effort: Pulmonary effort is normal. No respiratory distress.     Breath sounds: Normal breath sounds. No wheezing or rhonchi.  Musculoskeletal:        General: No swelling.  Skin:    General: Skin is warm and dry.  Neurological:     Mental Status: He is alert and oriented to person, place, and time.       Assessment & Plan:   Asbestosis [facility]) Patient states that this is been a stable interval for him.  He states that Anoro has improved his cough and shortness of breath.  He does still have occasional cough in the mornings before using his inhaler.  He is reluctant to try any reflux medications at this time.  Patient Instructions  Continue Anoro Stay active Keep follow up as scheduled with cardiology  Follow up: Follow up with Dr. Vaughan Browner in 1 year or sooner if needed       Fenton Foy, NP 08/23/2018

## 2018-09-02 DIAGNOSIS — R600 Localized edema: Secondary | ICD-10-CM | POA: Diagnosis not present

## 2018-09-02 DIAGNOSIS — R7303 Prediabetes: Secondary | ICD-10-CM | POA: Diagnosis not present

## 2018-09-02 DIAGNOSIS — I10 Essential (primary) hypertension: Secondary | ICD-10-CM | POA: Diagnosis not present

## 2018-09-02 DIAGNOSIS — J61 Pneumoconiosis due to asbestos and other mineral fibers: Secondary | ICD-10-CM | POA: Diagnosis not present

## 2018-09-02 DIAGNOSIS — E785 Hyperlipidemia, unspecified: Secondary | ICD-10-CM | POA: Diagnosis not present

## 2018-09-02 DIAGNOSIS — J439 Emphysema, unspecified: Secondary | ICD-10-CM | POA: Diagnosis not present

## 2018-09-02 DIAGNOSIS — N400 Enlarged prostate without lower urinary tract symptoms: Secondary | ICD-10-CM | POA: Diagnosis not present

## 2018-09-02 DIAGNOSIS — N183 Chronic kidney disease, stage 3 (moderate): Secondary | ICD-10-CM | POA: Diagnosis not present

## 2018-09-02 DIAGNOSIS — Z77090 Contact with and (suspected) exposure to asbestos: Secondary | ICD-10-CM | POA: Diagnosis not present

## 2018-09-02 DIAGNOSIS — I2510 Atherosclerotic heart disease of native coronary artery without angina pectoris: Secondary | ICD-10-CM | POA: Diagnosis not present

## 2018-09-13 ENCOUNTER — Telehealth: Payer: Self-pay | Admitting: Nurse Practitioner

## 2018-09-13 NOTE — Telephone Encounter (Signed)
Returned call to [facility] spoke with Legrand Como. He looked under patient account and there was not enough detail to see what was needed. He will sent Seth Bake an email and have her reach back out to to the office.

## 2018-09-14 NOTE — Telephone Encounter (Signed)
Attempted to call [facility] to speak with Seth Bake but unable to reach and unable to leave a VM. Will try to call back later.

## 2018-09-15 ENCOUNTER — Telehealth: Payer: Self-pay | Admitting: Pulmonary Disease

## 2018-09-15 DIAGNOSIS — J61 Pneumoconiosis due to asbestos and other mineral fibers: Secondary | ICD-10-CM

## 2018-09-15 DIAGNOSIS — R0602 Shortness of breath: Secondary | ICD-10-CM

## 2018-09-15 NOTE — Telephone Encounter (Signed)
Looking at pt's last OV with Dr. Vaughan Browner and also the last OV pt had with TN, I do not see where another CT was needed at this time.  Called [facility] and spoke with [facility]. Angela Nevin stated they received info from our office 7/1 in regards to a CT. I stated to Angela Nevin that pt was at the office 6/29 for a visit and nothing was stated in there that pt was needing a CT at this time. Stated to her that pt is to follow up with [place] in 1 year. Angela Nevin verbalized understanding and stated she would cancel that info they received. Nothing further needed.

## 2018-09-15 NOTE — Telephone Encounter (Signed)
Pt spouse is calling about getting yearly CT scan:  She says per Dr. Vaughan Browner he is supposed to get routine scans. Last ov with TN on 08/23/18:   Assessment & Plan:   Asbestosis [facility]) Patient states that this is been a stable interval for him.  He states that Anoro has improved his cough and shortness of breath.  He does still have occasional cough in the mornings before using his inhaler.  He is reluctant to try any reflux medications at this time.  Patient Instructions  Continue Anoro Stay active Keep follow up as scheduled with cardiology  Follow up: Follow up with Dr. Vaughan Browner in 1 year or sooner if needed

## 2018-09-15 NOTE — Telephone Encounter (Signed)
Seth Bake called back. Would like for someone to call and let her know if [place] wanted a new CT if so they need the order faxed to [phone].     Call Seth Bake at [phone]

## 2018-09-16 NOTE — Telephone Encounter (Signed)
CT order has been placed. Nothing further needed.

## 2018-09-16 NOTE — Telephone Encounter (Signed)
Yes. Please order HRCT. Thanks.

## 2018-09-20 ENCOUNTER — Other Ambulatory Visit: Payer: Self-pay | Admitting: General Surgery

## 2018-09-20 DIAGNOSIS — J61 Pneumoconiosis due to asbestos and other mineral fibers: Secondary | ICD-10-CM

## 2018-09-20 IMAGING — US US AORTA SCREENING (MEDICARE)
1 series · 7 of 7 positions shown · non-contrast
Comparison: None.

CLINICAL DATA: Male between 65-75 years of age with a smoking
history.

EXAM:
US ABDOMINAL AORTA MEDICARE SCREENING
TECHNIQUE: Ultrasound examination of the abdominal aorta was performed as a
screening evaluation for abdominal aortic aneurysm.

[Series 1: us aorta screening (medicare) · 0.34mm/px · 7 of 7 slices shown]
[im 1/7]
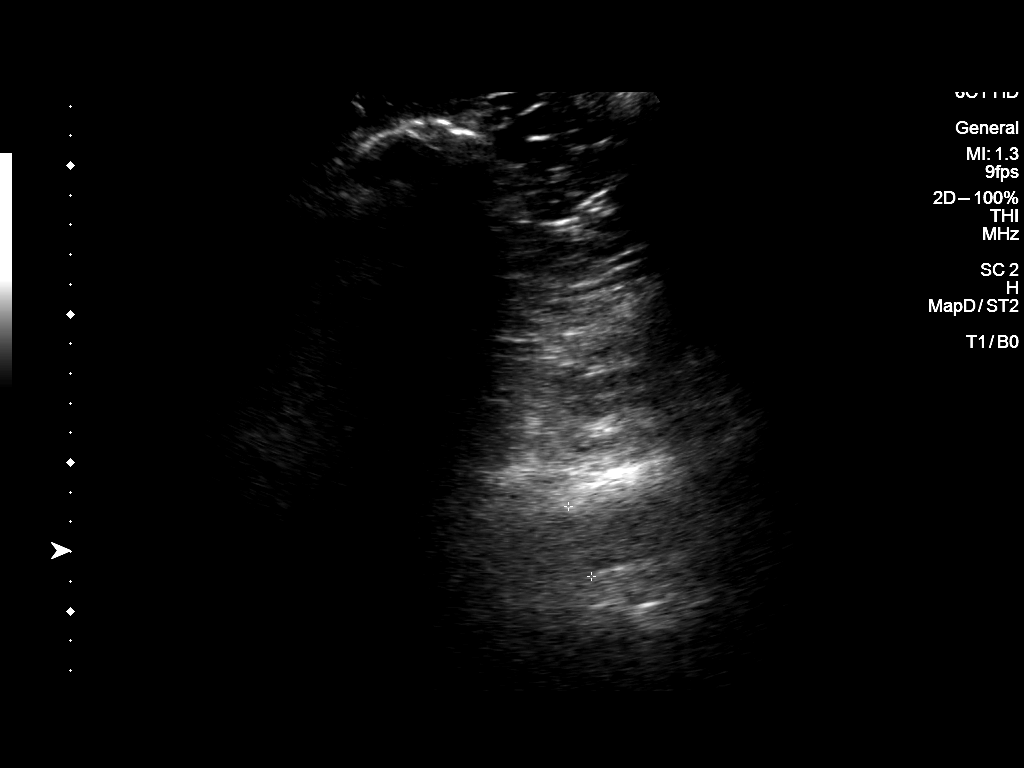
[im 2/7]
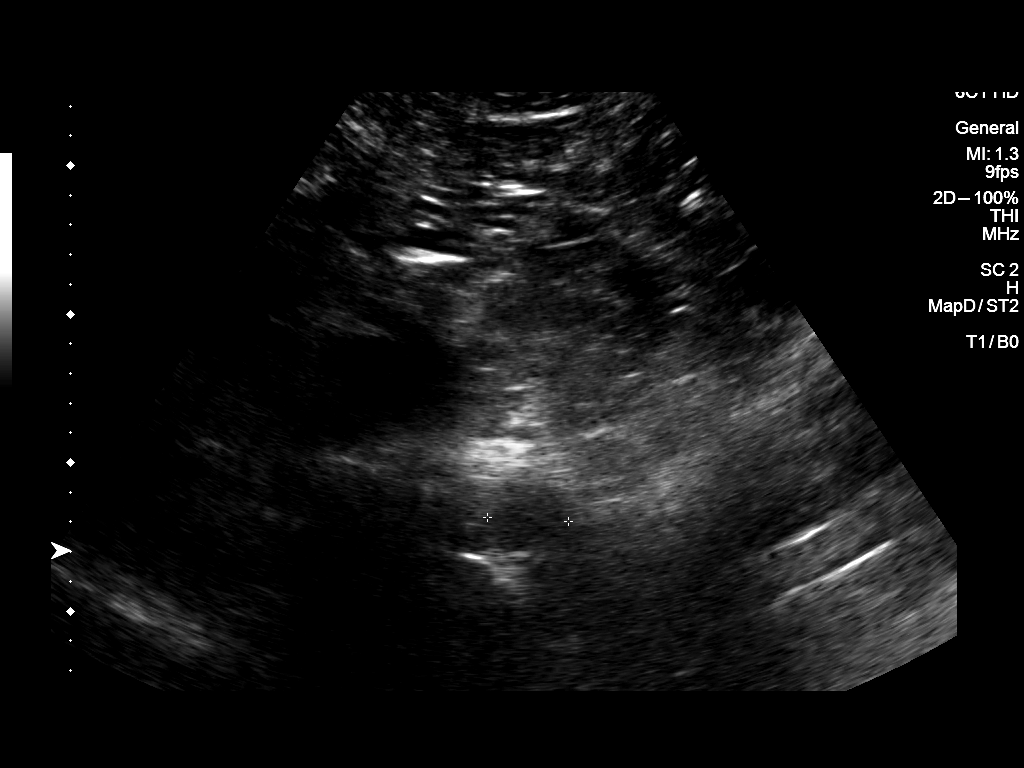
[im 3/7]
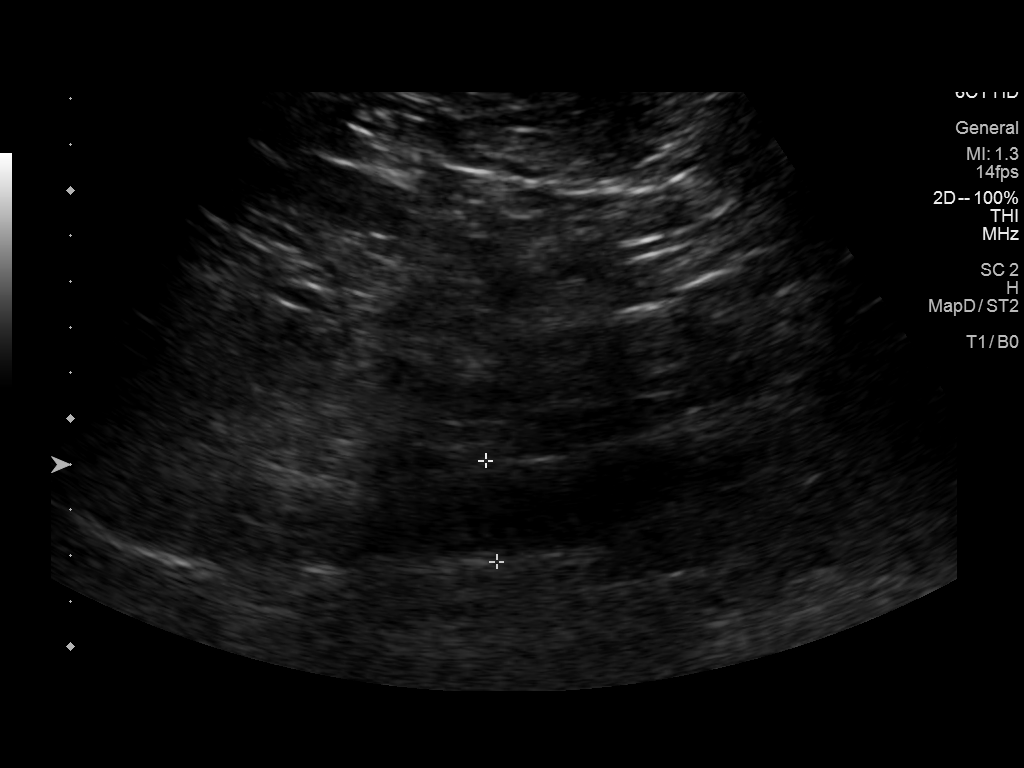
[im 4/7]
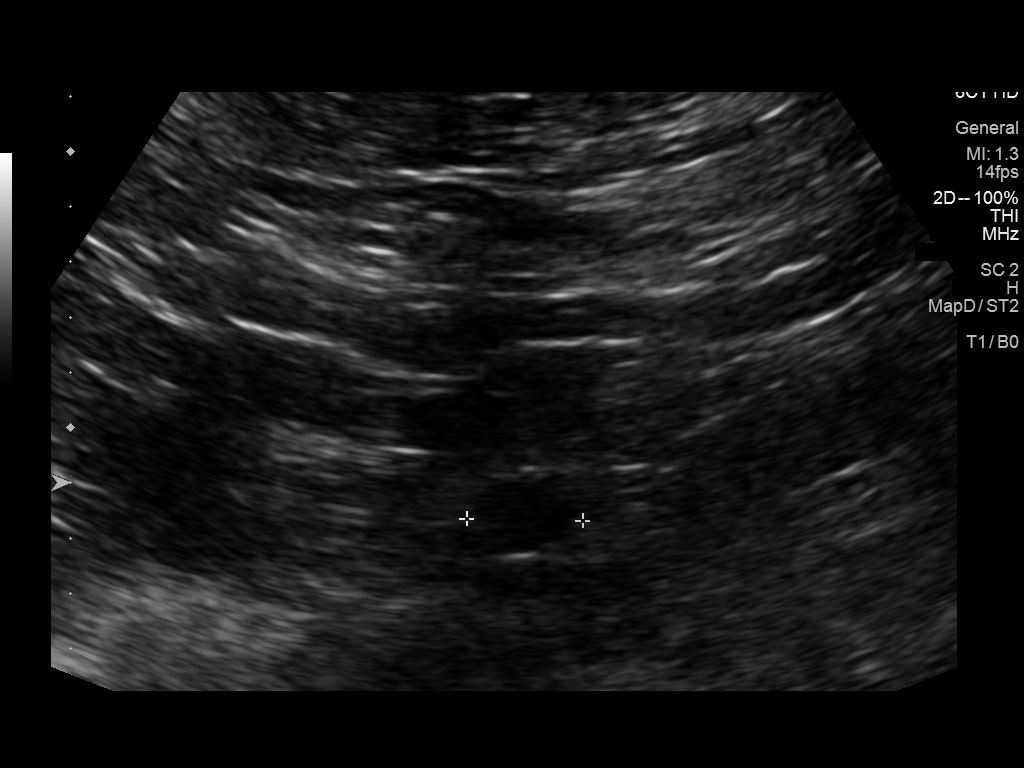
[im 5/7]
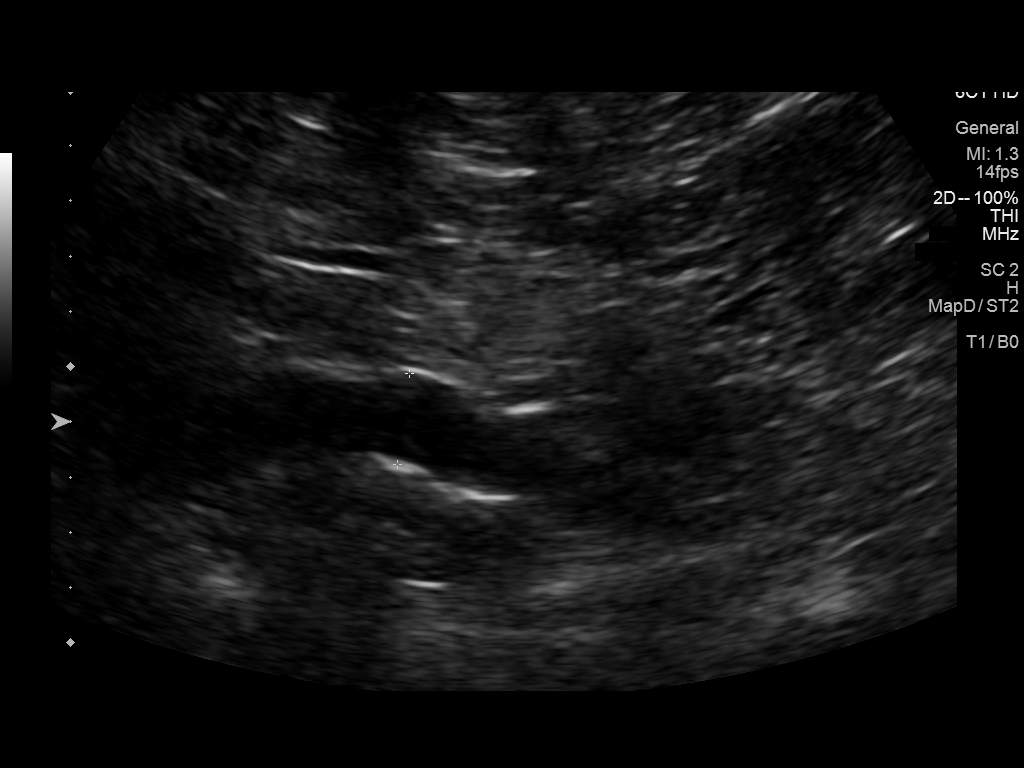
[im 6/7]
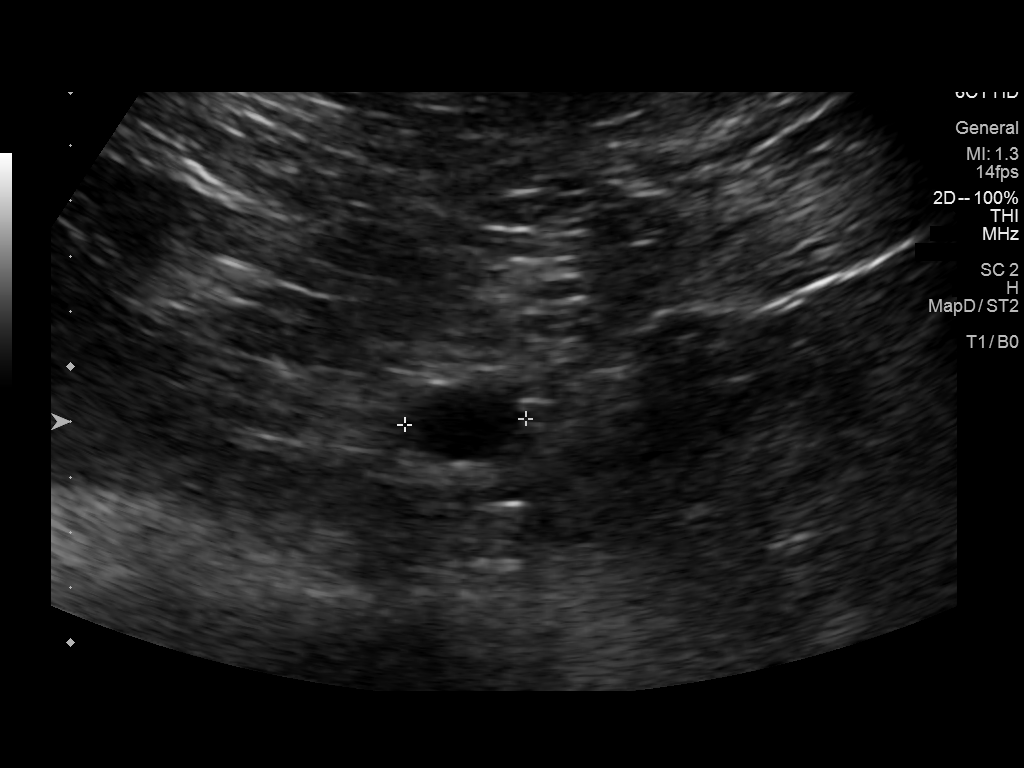
[im 7/7]
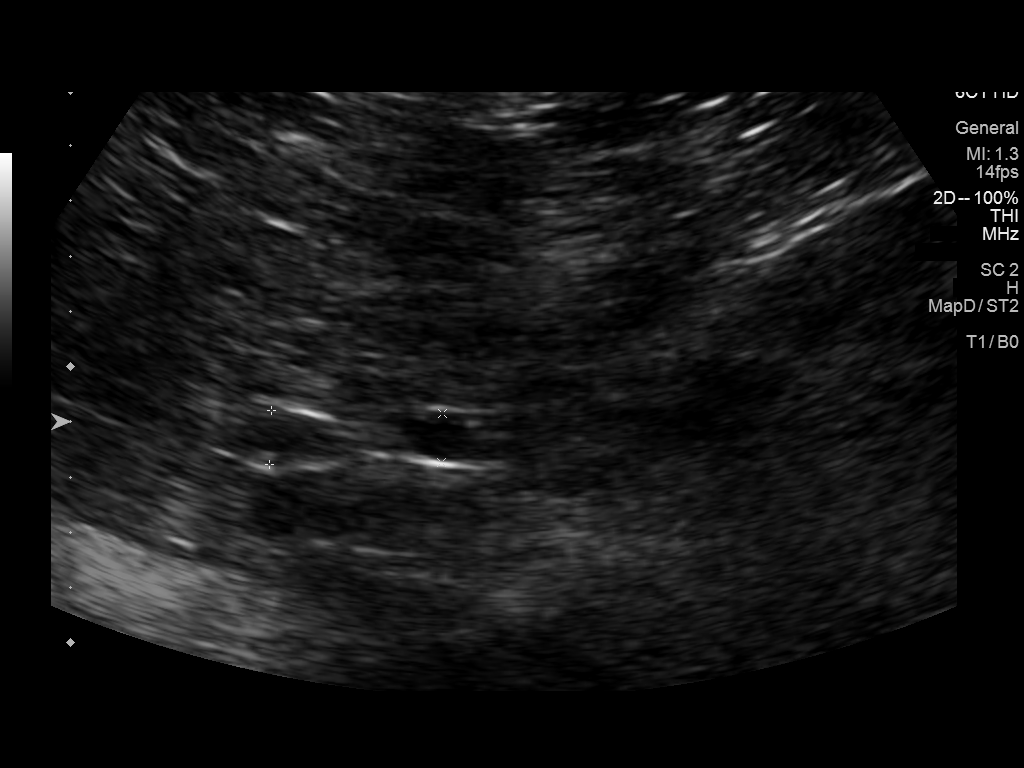

[7 of 7 positions shown; findings below may reference images not displayed]

FINDINGS: Abdominal aortic measurements as follows:

Proximal:  2.7 x 2.5 cm

Mid:  2.2 x 2.1 cm

Distal:  2.1 x 1.7 cm
IMPRESSION: No evidence of abdominal aortic aneurysm.

## 2018-09-27 DIAGNOSIS — J449 Chronic obstructive pulmonary disease, unspecified: Secondary | ICD-10-CM | POA: Diagnosis not present

## 2018-09-27 DIAGNOSIS — Z951 Presence of aortocoronary bypass graft: Secondary | ICD-10-CM | POA: Diagnosis not present

## 2018-09-27 DIAGNOSIS — N179 Acute kidney failure, unspecified: Secondary | ICD-10-CM | POA: Diagnosis not present

## 2018-09-27 DIAGNOSIS — I2510 Atherosclerotic heart disease of native coronary artery without angina pectoris: Secondary | ICD-10-CM | POA: Diagnosis not present

## 2018-09-27 DIAGNOSIS — D631 Anemia in chronic kidney disease: Secondary | ICD-10-CM | POA: Diagnosis not present

## 2018-09-27 DIAGNOSIS — N189 Chronic kidney disease, unspecified: Secondary | ICD-10-CM | POA: Diagnosis not present

## 2018-09-27 DIAGNOSIS — R7303 Prediabetes: Secondary | ICD-10-CM | POA: Diagnosis not present

## 2018-09-27 DIAGNOSIS — E875 Hyperkalemia: Secondary | ICD-10-CM | POA: Diagnosis not present

## 2018-09-27 DIAGNOSIS — N183 Chronic kidney disease, stage 3 (moderate): Secondary | ICD-10-CM | POA: Diagnosis not present

## 2018-09-27 DIAGNOSIS — N2581 Secondary hyperparathyroidism of renal origin: Secondary | ICD-10-CM | POA: Diagnosis not present

## 2018-09-27 DIAGNOSIS — Z9889 Other specified postprocedural states: Secondary | ICD-10-CM | POA: Diagnosis not present

## 2018-10-07 ENCOUNTER — Telehealth: Payer: Self-pay | Admitting: Nurse Practitioner

## 2018-10-07 NOTE — Telephone Encounter (Signed)
Call made to [facility]. Verified patient info, confirmed fax number. Made aware CT order has been faxed. Voiced understanding. Nothing further is needed at this time.

## 2018-10-08 ENCOUNTER — Other Ambulatory Visit: Payer: Self-pay | Admitting: Nephrology

## 2018-10-08 ENCOUNTER — Other Ambulatory Visit: Payer: Medicare HMO

## 2018-10-08 DIAGNOSIS — N183 Chronic kidney disease, stage 3 unspecified: Secondary | ICD-10-CM

## 2018-10-14 ENCOUNTER — Ambulatory Visit
Admission: RE | Admit: 2018-10-14 | Discharge: 2018-10-14 | Disposition: A | Discharge status: Home / Self Care | Payer: Medicare HMO | Source: Ambulatory Visit | Attending: Nephrology | Admitting: Nephrology

## 2018-10-14 DIAGNOSIS — N183 Chronic kidney disease, stage 3 unspecified: Secondary | ICD-10-CM

## 2018-10-26 ENCOUNTER — Telehealth: Payer: Self-pay | Admitting: Pulmonary Disease

## 2018-10-26 NOTE — Telephone Encounter (Signed)
Spoke with Peter Oneill and she is requesting CT results from [facility]. I asked her if they sent over the report and she stated they were supposed to fax it over. Dr. Vaughan Browner have you received this report? Please advise.

## 2018-10-27 ENCOUNTER — Other Ambulatory Visit: Payer: Self-pay | Admitting: *Deleted

## 2018-10-27 DIAGNOSIS — I2510 Atherosclerotic heart disease of native coronary artery without angina pectoris: Secondary | ICD-10-CM | POA: Diagnosis not present

## 2018-10-27 DIAGNOSIS — N183 Chronic kidney disease, stage 3 (moderate): Secondary | ICD-10-CM | POA: Diagnosis not present

## 2018-10-27 DIAGNOSIS — N2581 Secondary hyperparathyroidism of renal origin: Secondary | ICD-10-CM | POA: Diagnosis not present

## 2018-10-27 DIAGNOSIS — Z951 Presence of aortocoronary bypass graft: Secondary | ICD-10-CM | POA: Diagnosis not present

## 2018-10-27 DIAGNOSIS — D631 Anemia in chronic kidney disease: Secondary | ICD-10-CM | POA: Diagnosis not present

## 2018-10-27 DIAGNOSIS — Z9889 Other specified postprocedural states: Secondary | ICD-10-CM | POA: Diagnosis not present

## 2018-10-27 DIAGNOSIS — E875 Hyperkalemia: Secondary | ICD-10-CM | POA: Diagnosis not present

## 2018-10-27 DIAGNOSIS — N179 Acute kidney failure, unspecified: Secondary | ICD-10-CM | POA: Diagnosis not present

## 2018-10-27 DIAGNOSIS — R7303 Prediabetes: Secondary | ICD-10-CM | POA: Diagnosis not present

## 2018-10-27 DIAGNOSIS — J61 Pneumoconiosis due to asbestos and other mineral fibers: Secondary | ICD-10-CM | POA: Diagnosis not present

## 2018-10-27 MED ORDER — ANORO ELLIPTA 62.5-25 MCG/INH IN AEPB: INHALATION_SPRAY | RESPIRATORY_TRACT | 5 refills | Status: DC

## 2018-10-27 NOTE — Telephone Encounter (Signed)
Pt wife returning call and can be reached @ [phone].Hillery Hunter

## 2018-10-27 NOTE — Telephone Encounter (Signed)
Attempted to call patient, no answer, left message to call back.  

## 2018-10-27 NOTE — Telephone Encounter (Signed)
I am able to see the report on care everywhere.  As per report there is reconfirmation of asbestos related lung disease  I am not sure why it was done a [facility] as I cannot view the CT images. Can he get the images on a disc to [place] for direct comparison to previous CT?  Marshell Garfinkel MD [facility] 10/27/2018, 9:31 AM

## 2018-10-27 NOTE — Telephone Encounter (Signed)
LMTCB x1 for pt's wife.  

## 2018-10-28 NOTE — Telephone Encounter (Signed)
Contacted patient's wife, Dara Lords.  She has the disc from [facility] and will bring it by our office tomorrow 10/29/18.  She states the image was done at [facility] due to this involves worker's comp and they would not schedule the imaging at [facility], as the patient requested.  Will close this encounter.

## 2018-10-29 NOTE — Telephone Encounter (Signed)
High resolution chest CT CD received this morning for Dr Vaughan Browner. Dr Vaughan Browner will return the office 11/02/18.  Will give CD to him at that time.  Nothing further at this time.

## 2018-11-05 ENCOUNTER — Telehealth: Payer: Self-pay | Admitting: Pulmonary Disease

## 2018-11-05 DIAGNOSIS — J61 Pneumoconiosis due to asbestos and other mineral fibers: Secondary | ICD-10-CM

## 2018-11-05 DIAGNOSIS — R0602 Shortness of breath: Secondary | ICD-10-CM

## 2018-11-05 NOTE — Telephone Encounter (Signed)
CT from [place] shows similar findings of asbestos exposure and mild scarring. There does not appear to be any change from last year.  Please order spirometry, diffusion capacity and follow up visit as he will need annual monitoring.

## 2018-11-05 NOTE — Telephone Encounter (Signed)
Dr. Vaughan Browner did you receive a disc with pt's CT scan on it? Peter Oneill states she dropped it off and she would like the results. Please advise.

## 2018-11-05 NOTE — Telephone Encounter (Signed)
Called pt;s wife and advised message from the provider. She understood and verbalized understanding. Nothing further is needed.   Order placed.

## 2018-11-09 DIAGNOSIS — H2513 Age-related nuclear cataract, bilateral: Secondary | ICD-10-CM | POA: Diagnosis not present

## 2018-11-09 DIAGNOSIS — H401131 Primary open-angle glaucoma, bilateral, mild stage: Secondary | ICD-10-CM | POA: Diagnosis not present

## 2018-11-11 DIAGNOSIS — Z23 Encounter for immunization: Secondary | ICD-10-CM | POA: Diagnosis not present

## 2018-11-24 DIAGNOSIS — J439 Emphysema, unspecified: Secondary | ICD-10-CM | POA: Diagnosis not present

## 2018-11-24 DIAGNOSIS — I10 Essential (primary) hypertension: Secondary | ICD-10-CM | POA: Diagnosis not present

## 2018-11-24 DIAGNOSIS — H409 Unspecified glaucoma: Secondary | ICD-10-CM | POA: Diagnosis not present

## 2018-11-24 DIAGNOSIS — I2510 Atherosclerotic heart disease of native coronary artery without angina pectoris: Secondary | ICD-10-CM | POA: Diagnosis not present

## 2018-11-24 DIAGNOSIS — N400 Enlarged prostate without lower urinary tract symptoms: Secondary | ICD-10-CM | POA: Diagnosis not present

## 2018-11-24 DIAGNOSIS — E785 Hyperlipidemia, unspecified: Secondary | ICD-10-CM | POA: Diagnosis not present

## 2018-11-24 DIAGNOSIS — N183 Chronic kidney disease, stage 3 (moderate): Secondary | ICD-10-CM | POA: Diagnosis not present

## 2018-12-01 DIAGNOSIS — I10 Essential (primary) hypertension: Secondary | ICD-10-CM | POA: Diagnosis not present

## 2018-12-01 DIAGNOSIS — N183 Chronic kidney disease, stage 3 unspecified: Secondary | ICD-10-CM | POA: Diagnosis not present

## 2018-12-01 DIAGNOSIS — E785 Hyperlipidemia, unspecified: Secondary | ICD-10-CM | POA: Diagnosis not present

## 2018-12-01 DIAGNOSIS — H409 Unspecified glaucoma: Secondary | ICD-10-CM | POA: Diagnosis not present

## 2018-12-01 DIAGNOSIS — J439 Emphysema, unspecified: Secondary | ICD-10-CM | POA: Diagnosis not present

## 2018-12-01 DIAGNOSIS — N400 Enlarged prostate without lower urinary tract symptoms: Secondary | ICD-10-CM | POA: Diagnosis not present

## 2018-12-01 DIAGNOSIS — I2510 Atherosclerotic heart disease of native coronary artery without angina pectoris: Secondary | ICD-10-CM | POA: Diagnosis not present

## 2018-12-17 DIAGNOSIS — N2581 Secondary hyperparathyroidism of renal origin: Secondary | ICD-10-CM | POA: Diagnosis not present

## 2018-12-17 DIAGNOSIS — J61 Pneumoconiosis due to asbestos and other mineral fibers: Secondary | ICD-10-CM | POA: Diagnosis not present

## 2018-12-17 DIAGNOSIS — D631 Anemia in chronic kidney disease: Secondary | ICD-10-CM | POA: Diagnosis not present

## 2018-12-17 DIAGNOSIS — J449 Chronic obstructive pulmonary disease, unspecified: Secondary | ICD-10-CM | POA: Diagnosis not present

## 2018-12-17 DIAGNOSIS — I2510 Atherosclerotic heart disease of native coronary artery without angina pectoris: Secondary | ICD-10-CM | POA: Diagnosis not present

## 2018-12-17 DIAGNOSIS — N179 Acute kidney failure, unspecified: Secondary | ICD-10-CM | POA: Diagnosis not present

## 2018-12-17 DIAGNOSIS — R7303 Prediabetes: Secondary | ICD-10-CM | POA: Diagnosis not present

## 2018-12-17 DIAGNOSIS — N183 Chronic kidney disease, stage 3 unspecified: Secondary | ICD-10-CM | POA: Diagnosis not present

## 2018-12-17 DIAGNOSIS — Z9889 Other specified postprocedural states: Secondary | ICD-10-CM | POA: Diagnosis not present

## 2018-12-17 DIAGNOSIS — E875 Hyperkalemia: Secondary | ICD-10-CM | POA: Diagnosis not present

## 2018-12-27 ENCOUNTER — Telehealth: Payer: Self-pay | Admitting: Interventional Cardiology

## 2018-12-27 ENCOUNTER — Encounter: Payer: Self-pay | Admitting: Interventional Cardiology

## 2018-12-27 ENCOUNTER — Other Ambulatory Visit: Payer: Self-pay

## 2018-12-27 ENCOUNTER — Ambulatory Visit: Payer: Medicare HMO | Admitting: Interventional Cardiology

## 2018-12-27 VITALS — BP 138/70 | HR 73 | Ht 69.0 in | Wt 222.0 lb

## 2018-12-27 DIAGNOSIS — N183 Chronic kidney disease, stage 3 unspecified: Secondary | ICD-10-CM

## 2018-12-27 DIAGNOSIS — E782 Mixed hyperlipidemia: Secondary | ICD-10-CM

## 2018-12-27 DIAGNOSIS — I25119 Atherosclerotic heart disease of native coronary artery with unspecified angina pectoris: Secondary | ICD-10-CM | POA: Diagnosis not present

## 2018-12-27 DIAGNOSIS — I6523 Occlusion and stenosis of bilateral carotid arteries: Secondary | ICD-10-CM | POA: Diagnosis not present

## 2018-12-27 MED ORDER — PANTOPRAZOLE SODIUM 40 MG PO TBEC: 40.0000 mg | DELAYED_RELEASE_TABLET | Freq: Every day | ORAL | 0 refills | Status: DC

## 2018-12-27 NOTE — Telephone Encounter (Signed)
    COVID-19 Pre-Screening Questions:  . In the past 7 to 10 days have you had a cough,  shortness of breath, headache, congestion, fever (100 or greater) body aches, chills, sore throat, or sudden loss of taste or sense of smell? -has chronic cough and shortness of breath.  This is not new . Have you been around anyone with known Covid 19. no . Have you been around anyone who is awaiting Covid 19 test results in the past 7 to 10 days? no . Have you been around anyone who has been exposed to Covid 19, or has mentioned symptoms of Covid 19 within the past 7 to 10 days? no  If you have any concerns/questions about symptoms patients report during screening (either on the phone or at threshold). Contact the provider seeing the patient or DOD for further guidance.  If neither are available contact a member of the leadership team.   I spoke with pt's wife and scheduled pt to see Dr Irish Lack at 3:20 today.  Wife reports pt has mask. I told her pt needs to wear this the entire time he is the building.  She is aware he should arrive 15 minutes prior to appointment and that no one may accompany him to appointment.  She may want to be on speaker phone during the appointment to answer questions as needed.

## 2018-12-27 NOTE — Telephone Encounter (Signed)
I spoke with pt's wife. She reports pt had burning sensation in chest Saturday and Sunday night. Lasted most of the night. Eventually went away. Did not use NTG.  Today he is feeling fine.  No burning in chest or other complaints. Wife reports BP 163/76.  She reports burning in chest is what pt has when having heart issues. Will review with Dr Irish Lack.

## 2018-12-27 NOTE — Telephone Encounter (Signed)
° ° °  Spouse calling to report patient has "burning sensation"in his chest. States it is not pain Requesting EKG No other symptoms

## 2018-12-27 NOTE — Progress Notes (Signed)
Cardiology Office Note   Date:  12/27/2018   ID:  Peter Oneill, DOB 05-23-1940, MRN [id number]  PCP:  Kristen Loader, FNP    No chief complaint on file.  CAD  Wt Readings from Last 3 Encounters:  12/27/18 222 lb (100.7 kg)  08/23/18 228 lb 3.2 oz (103.5 kg)  01/27/18 227 lb 12.8 oz (103.3 kg)       History of Present Illness: Peter Oneill is a 78 y.o. male  Who has had extensive CAD.3.5 x 13 Cypher, and 3.5 x 18 Cypher in 2003. He thenhad CABG in 2003 after having burning in his chest, which occurred with walking/hunting. A few years later, he required several stents, (2004; 3.5 x 18 Cypher). Details are not available at this time. He required repeat CABG in 12/16, with RIMA to LAD and SVG to diagonal. Last cath was 10/17 in which a stent was placed. THat cath was done at [place], [place].   Additional anatomic information from prior records from 2015 cath: LIMA to LAD is atretic. LAD filled by SVG to diagonal prior to second bypass surgery in 2016. SVG to circumflex is occluded. This information comes from a cardiac cath report from 2015. At that timein 2015, the RCA was stented with a 3.5 x 12, and 3.0 x 8Xience drug-eluting stent. 2015 , he had 3.5 x 15 Xience to the SVG to diagonal.  He also has a 3.5 x 22 resolute stent in the circumflex.3.0 x 12 Onyx in the Circ, 2.5 x 12 in the OM (2017)  He had CEA in 1996   On 12/25/2018, he had persistent chest burning that lasted a few minutes.  He did not use NTG or seek attention in the ER.  He has not had issues with walking or mowing the lawn.  It occurred while he would lie on his right side, and resolved when he changed positions.  Denies : Exertional Chest pain. Dizziness. Leg edema. Nitroglycerin use. Orthopnea. Palpitations. Paroxysmal nocturnal dyspnea. Shortness of breath. Syncope.   He works in the yard.     Past Medical History:  Diagnosis Date  . Asbestosis ([place])   . Contraindication to  percutaneous coronary intervention (PCI)   . Coronary artery disease   . Dyslipidemia   . Hypercholesterolemia   . Hypertension   . Ileus ([place])   . Pleural effusion on left   . Renal failure   . Sleep apnea     Past Surgical History:  Procedure Laterality Date  . BACK SURGERY    . CARDIAC CATHETERIZATION    . CAROTID ENDARTERECTOMY    . CARPAL TUNNEL RELEASE    . CORONARY ARTERY BYPASS GRAFT    . HERNIA REPAIR    . PERCUTANEOUS CORONARY STENT INTERVENTION (PCI-S)     multiple  . STERNOTOMY       Current Outpatient Medications  Medication Sig Dispense Refill  . allopurinol (ZYLOPRIM) 300 MG tablet Take 300 mg by mouth daily.    Marland Kitchen aspirin EC 81 MG tablet Take 81 mg by mouth daily.    . carvedilol (COREG) 12.5 MG tablet Take 12.5 mg by mouth 2 (two) times daily with a meal.    . clopidogrel (PLAVIX) 75 MG tablet Take 75 mg by mouth daily.    Marland Kitchen ezetimibe (ZETIA) 10 MG tablet Take 10 mg by mouth daily.    . furosemide (LASIX) 20 MG tablet Take 40 mg by mouth as needed.     . latanoprost (XALATAN)  0.005 % ophthalmic solution Place 1 drop into both eyes at bedtime.    . Multiple Vitamin (MULTIVITAMIN) tablet Take 1 tablet by mouth daily.    . nitroGLYCERIN (NITROSTAT) 0.4 MG SL tablet Place 1 tablet (0.4 mg total) under the tongue every 5 (five) minutes as needed for chest pain. 25 tablet 3  . Omega-3 Fatty Acids (FISH OIL PO) Take by mouth.    . rosuvastatin (CRESTOR) 5 MG tablet Take 5 mg by mouth daily.    . Silodosin (RAPAFLO PO) Take 8 mg by mouth daily.     Marland Kitchen umeclidinium-vilanterol (ANORO ELLIPTA) 62.5-25 MCG/INH AEPB INHALE 1 PUFF INTO THE LUNGS DAILY 60 each 5   No current facility-administered medications for this visit.     Allergies:   Patient has no known allergies.    Social History:  The patient  reports that he quit smoking about 42 years ago. His smoking use included cigarettes. He has a 4.75 pack-year smoking history. He has never used smokeless tobacco. He  reports current alcohol use. He reports that he does not use drugs.   Family History:  The patient's family history includes Heart disease in his father.    ROS:  Please see the history of present illness.   Otherwise, review of systems are positive for intentional weight loss.   All other systems are reviewed and negative.    PHYSICAL EXAM: VS:  BP 138/70   Pulse 73   Ht 5\' 9"  (1.753 m)   Wt 222 lb (100.7 kg)   SpO2 99%   BMI 32.78 kg/m  , BMI Body mass index is 32.78 kg/m. GEN: Well nourished, well developed, in no acute distress  HEENT: normal  Neck: no JVD, carotid bruits, or masses Cardiac: RRR; no murmurs, rubs, or gallops,no edema  Respiratory:  clear to auscultation bilaterally, normal work of breathing GI: soft, nontender, nondistended, + BS, obese MS: no deformity or atrophy  Skin: warm and dry, no rash Neuro:  Strength and sensation are intact Psych: euthymic mood, full affect   EKG:   The ekg ordered today demonstrates NSR, prolonged PR interval; Anterolateral ST depression- unchanged from 2019 ECG   Recent Labs: No results found for requested labs within last 8760 hours.   Lipid Panel    Component Value Date/Time   CHOL 125 03/17/2016 1226   TRIG 92 03/17/2016 1226   HDL 41 03/17/2016 1226   CHOLHDL 3.0 03/17/2016 1226   LDLCALC 66 03/17/2016 1226     Other studies Reviewed: Additional studies/ records that were reviewed today with results demonstrating: 09/2018: Chest CT: Bilateral bulky, partially calcified pleural plaques are consistent with the history of prior asbestos exposure. There is mild bibasilar pleural parenchymal banding, though no honeycombing or reticulation to suggest interstitial lung disease. A small  loculated right pleural effusion is present. .   ASSESSMENT AND PLAN:  1. CAD: Episode of chest burning a few days ago- lasting just a minute until he changed positions.  Now resolved.  Explained ER precautions and NTG use again.  Not  related to exertion. Try protonix 40 mg daily for a few days to see if he gets relief.  If sx get worse, he will let us know.  ECG was unchanged- reassuring. 2. Carotid artery disease: Moderate carotid disease in 03/2018.  Plan for Doppler in 2021.  3. Hyperlipidemia: Due for a recheck.  COntinue statin.   4. CRI: Followed by Dr. Justin Mend.  WIll check what labs he had done.  Current medicines are reviewed at length with the patient today.  The patient concerns regarding his medicines were addressed.  The following changes have been made:  No change  Labs/ tests ordered today include:  No orders of the defined types were placed in this encounter.   Recommend 150 minutes/week of aerobic exercise Low fat, low carb, high fiber diet recommended  Disposition:   FU in 1 year   Signed, Larae Grooms, MD  12/27/2018 3:52 PM    [facility] [address] [place], [place]  [address] Phone: [phone]; Fax: [phone]

## 2018-12-27 NOTE — Patient Instructions (Signed)
Medication Instructions:  Your physician has recommended you make the following change in your medication:   START: pantoprazole (protonix) 40 mg tablet: Take 1 tablet by mouth once a day  *If you need a refill on your cardiac medications before your next appointment, please call your pharmacy*  Lab Work: None ordered  If you have labs (blood work) drawn today and your tests are completely normal, you will receive your results only by: Marland Kitchen MyChart Message (if you have MyChart) OR . A paper copy in the mail If you have any lab test that is abnormal or we need to change your treatment, we will call you to review the results.  Testing/Procedures: None ordered  Follow-Up: At [facility], you and your health needs are our priority.  As part of our continuing mission to provide you with exceptional heart care, we have created designated Provider Care Teams.  These Care Teams include your primary Cardiologist (physician) and Advanced Practice Providers (APPs -  Physician Assistants and Nurse Practitioners) who all work together to provide you with the care you need, when you need it.  Your next appointment:   6 months  The format for your next appointment:   Either In Person or Virtual  Provider:   You may see Larae Grooms, MD or one of the following Advanced Practice Providers on your designated Care Team:    Melina Copa, PA-C  Ermalinda Barrios, PA-C   Other Instructions

## 2019-01-14 DIAGNOSIS — N1832 Chronic kidney disease, stage 3b: Secondary | ICD-10-CM | POA: Diagnosis not present

## 2019-01-18 DIAGNOSIS — R0781 Pleurodynia: Secondary | ICD-10-CM | POA: Diagnosis not present

## 2019-01-18 DIAGNOSIS — W19XXXA Unspecified fall, initial encounter: Secondary | ICD-10-CM | POA: Diagnosis not present

## 2019-01-25 DIAGNOSIS — E875 Hyperkalemia: Secondary | ICD-10-CM | POA: Diagnosis not present

## 2019-01-25 DIAGNOSIS — N184 Chronic kidney disease, stage 4 (severe): Secondary | ICD-10-CM | POA: Diagnosis not present

## 2019-01-25 DIAGNOSIS — N2581 Secondary hyperparathyroidism of renal origin: Secondary | ICD-10-CM | POA: Diagnosis not present

## 2019-01-25 DIAGNOSIS — R7303 Prediabetes: Secondary | ICD-10-CM | POA: Diagnosis not present

## 2019-01-25 DIAGNOSIS — Z9889 Other specified postprocedural states: Secondary | ICD-10-CM | POA: Diagnosis not present

## 2019-01-25 DIAGNOSIS — I2510 Atherosclerotic heart disease of native coronary artery without angina pectoris: Secondary | ICD-10-CM | POA: Diagnosis not present

## 2019-01-25 DIAGNOSIS — J449 Chronic obstructive pulmonary disease, unspecified: Secondary | ICD-10-CM | POA: Diagnosis not present

## 2019-01-25 DIAGNOSIS — Z951 Presence of aortocoronary bypass graft: Secondary | ICD-10-CM | POA: Diagnosis not present

## 2019-01-25 DIAGNOSIS — J61 Pneumoconiosis due to asbestos and other mineral fibers: Secondary | ICD-10-CM | POA: Diagnosis not present

## 2019-01-25 DIAGNOSIS — N179 Acute kidney failure, unspecified: Secondary | ICD-10-CM | POA: Diagnosis not present

## 2019-01-28 ENCOUNTER — Ambulatory Visit: Payer: Medicare HMO | Admitting: Interventional Cardiology

## 2019-02-09 ENCOUNTER — Ambulatory Visit: Payer: Medicare HMO | Admitting: Interventional Cardiology

## 2019-02-09 ENCOUNTER — Encounter: Payer: Self-pay | Admitting: Interventional Cardiology

## 2019-02-09 ENCOUNTER — Other Ambulatory Visit: Payer: Self-pay

## 2019-02-09 VITALS — BP 170/100 | HR 102 | Ht 69.0 in | Wt 224.2 lb

## 2019-02-09 DIAGNOSIS — I6523 Occlusion and stenosis of bilateral carotid arteries: Secondary | ICD-10-CM | POA: Diagnosis not present

## 2019-02-09 DIAGNOSIS — N183 Chronic kidney disease, stage 3 unspecified: Secondary | ICD-10-CM

## 2019-02-09 DIAGNOSIS — E782 Mixed hyperlipidemia: Secondary | ICD-10-CM | POA: Diagnosis not present

## 2019-02-09 DIAGNOSIS — I25119 Atherosclerotic heart disease of native coronary artery with unspecified angina pectoris: Secondary | ICD-10-CM | POA: Diagnosis not present

## 2019-02-09 MED ORDER — PANTOPRAZOLE SODIUM 40 MG PO TBEC: 40.0000 mg | DELAYED_RELEASE_TABLET | Freq: Every day | ORAL | 3 refills | Status: DC | PRN

## 2019-02-09 NOTE — Patient Instructions (Signed)

## 2019-02-09 NOTE — Progress Notes (Signed)
Cardiology Office Note   Date:  02/09/2019   ID:  Peter Oneill, DOB 1940-06-09, MRN [id number]  PCP:  Kristen Loader, FNP    No chief complaint on file.  CAD  Wt Readings from Last 3 Encounters:  02/09/19 224 lb 3.2 oz (101.7 kg)  12/27/18 222 lb (100.7 kg)  08/23/18 228 lb 3.2 oz (103.5 kg)       History of Present Illness: Peter Oneill is a 78 y.o. male  Who has had extensive CAD.3.5 x 13 Cypher, and 3.5 x 18 Cypher in 2003. He thenhad CABG in 2003 after having burning in his chest, which occurred with walking/hunting. A few years later, he required several stents, (2004; 3.5 x 18 Cypher). Details are not available at this time. He required repeat CABG in 12/16, with RIMA to LAD and SVG to diagonal. Last cath was 10/17 in which a stent was placed. THat cath was done at [place], [place].   Additional anatomic information from prior records from 2015 cath: LIMA to LAD is atretic. LAD filled by SVG to diagonal prior to second bypass surgery in 2016. SVG to circumflex is occluded. This information comes from a cardiac cath report from 2015. At that timein 2015, the RCA was stented with a 3.5 x 12, and 3.0 x 8Xience drug-eluting stent. 2015 , he had 3.5 x 15 Xience to the SVG to diagonal.  He also has a 3.5 x 22 resolute stent in the circumflex.3.0 x 12 Onyx in the Circ, 2.5 x 12 in the OM (2017)  He had CEA in 1996  On 12/25/2018, he had persistent chest burning that lasted a few minutes.  He did not use NTG or seek attention in the ER.  He has not had issues with walking or mowing the lawn.  It occurred while he would lie on his right side, and resolved when he changed positions.  Since the last visit, he fell in the shower around Thanksgiving.  He had a big bruise.   We had tried Protonix for the chest burning that he had on the right side.  It worked well and there was no residual burning.  He still has some residual back pain from the fall, but it  improved.      Past Medical History:  Diagnosis Date  . Asbestosis ([place])   . Contraindication to percutaneous coronary intervention (PCI)   . Coronary artery disease   . Dyslipidemia   . Hypercholesterolemia   . Hypertension   . Ileus ([place])   . Pleural effusion on left   . Renal failure   . Sleep apnea     Past Surgical History:  Procedure Laterality Date  . BACK SURGERY    . CARDIAC CATHETERIZATION    . CAROTID ENDARTERECTOMY    . CARPAL TUNNEL RELEASE    . CORONARY ARTERY BYPASS GRAFT    . HERNIA REPAIR    . PERCUTANEOUS CORONARY STENT INTERVENTION (PCI-S)     multiple  . STERNOTOMY       Current Outpatient Medications  Medication Sig Dispense Refill  . allopurinol (ZYLOPRIM) 300 MG tablet Take 300 mg by mouth daily.    Marland Kitchen aspirin EC 81 MG tablet Take 81 mg by mouth daily.    . carvedilol (COREG) 12.5 MG tablet Take 12.5 mg by mouth 2 (two) times daily with a meal.    . clopidogrel (PLAVIX) 75 MG tablet Take 75 mg by mouth daily.    Marland Kitchen ezetimibe (ZETIA)  10 MG tablet Take 10 mg by mouth daily.    . furosemide (LASIX) 20 MG tablet Take 40 mg by mouth as needed.     . latanoprost (XALATAN) 0.005 % ophthalmic solution Place 1 drop into both eyes at bedtime.    . Multiple Vitamin (MULTIVITAMIN) tablet Take 1 tablet by mouth daily.    . nitroGLYCERIN (NITROSTAT) 0.4 MG SL tablet Place 1 tablet (0.4 mg total) under the tongue every 5 (five) minutes as needed for chest pain. 25 tablet 3  . Omega-3 Fatty Acids (FISH OIL PO) Take by mouth.    . pantoprazole (PROTONIX) 40 MG tablet Take 1 tablet (40 mg total) by mouth daily. 30 tablet 0  . rosuvastatin (CRESTOR) 5 MG tablet Take 5 mg by mouth daily.    . Silodosin (RAPAFLO PO) Take 8 mg by mouth daily.     Marland Kitchen umeclidinium-vilanterol (ANORO ELLIPTA) 62.5-25 MCG/INH AEPB INHALE 1 PUFF INTO THE LUNGS DAILY 60 each 5   No current facility-administered medications for this visit.    Allergies:   Patient has no known allergies.     Social History:  The patient  reports that he quit smoking about 42 years ago. His smoking use included cigarettes. He has a 4.75 pack-year smoking history. He has never used smokeless tobacco. He reports current alcohol use. He reports that he does not use drugs.   Family History:  The patient's family history includes Heart disease in his father.    ROS:  Please see the history of present illness.   Otherwise, review of systems are positive for residual back pain.   All other systems are reviewed and negative.    PHYSICAL EXAM: VS:  BP (!) 170/100   Pulse (!) 102   Ht 5\' 9"  (1.753 m)   Wt 224 lb 3.2 oz (101.7 kg)   SpO2 97%   BMI 33.11 kg/m  , BMI Body mass index is 33.11 kg/m. GEN: Well nourished, well developed, in no acute distress  HEENT: normal  Neck: no JVD, carotid bruits, or masses Cardiac: RRR; no murmurs, rubs, or gallops,no edema  Respiratory:  clear to auscultation bilaterally, normal work of breathing GI: soft, nontender, nondistended, + BS MS: no deformity or atrophy  Skin: warm and dry, no rash Neuro:  Strength and sensation are intact; slow gait with cane Psych: euthymic mood, full affect   EKG:   The ekg ordered 12/2018 demonstrates NSR, anterior T wave inversion   Recent Labs: No results found for requested labs within last 8760 hours.   Lipid Panel    Component Value Date/Time   CHOL 125 03/17/2016 1226   TRIG 92 03/17/2016 1226   HDL 41 03/17/2016 1226   CHOLHDL 3.0 03/17/2016 1226   LDLCALC 66 03/17/2016 1226     Other studies Reviewed: Additional studies/ records that were reviewed today with results demonstrating: labs reviewed .   ASSESSMENT AND PLAN:  1. CAD: No angina on medical therapy.  Continue aggressive secondary prevention.  2. Carotid disease: Moderate disease.  Yearly f/u.  3. Hyperlipidemia: LDL 56. Continue statin.  4. CRI: 2.2-2.7 range.  Avoid nephrotoxins.  5. GERD: Add protonix.  He will use as needed.  Given the  CRI that he has, will try to minimize use.    Current medicines are reviewed at length with the patient today.  The patient concerns regarding his medicines were addressed.  The following changes have been made:  Refill protonix Labs/ tests ordered today include:  No orders of the defined types were placed in this encounter.   Recommend 150 minutes/week of aerobic exercise Low fat, low carb, high fiber diet recommended  Disposition:   FU in 1 year   Signed, Larae Grooms, MD  02/09/2019 3:47 PM    [facility] [place], [place], [place]  [address] Phone: [phone]; Fax: [phone]

## 2019-02-10 DIAGNOSIS — I10 Essential (primary) hypertension: Secondary | ICD-10-CM | POA: Diagnosis not present

## 2019-02-10 DIAGNOSIS — N183 Chronic kidney disease, stage 3 unspecified: Secondary | ICD-10-CM | POA: Diagnosis not present

## 2019-02-10 DIAGNOSIS — H409 Unspecified glaucoma: Secondary | ICD-10-CM | POA: Diagnosis not present

## 2019-02-10 DIAGNOSIS — I2510 Atherosclerotic heart disease of native coronary artery without angina pectoris: Secondary | ICD-10-CM | POA: Diagnosis not present

## 2019-02-10 DIAGNOSIS — E785 Hyperlipidemia, unspecified: Secondary | ICD-10-CM | POA: Diagnosis not present

## 2019-02-10 DIAGNOSIS — J439 Emphysema, unspecified: Secondary | ICD-10-CM | POA: Diagnosis not present

## 2019-02-10 DIAGNOSIS — N400 Enlarged prostate without lower urinary tract symptoms: Secondary | ICD-10-CM | POA: Diagnosis not present

## 2019-03-01 DIAGNOSIS — H401131 Primary open-angle glaucoma, bilateral, mild stage: Secondary | ICD-10-CM | POA: Diagnosis not present

## 2019-03-01 DIAGNOSIS — H2513 Age-related nuclear cataract, bilateral: Secondary | ICD-10-CM | POA: Diagnosis not present

## 2019-03-03 IMAGING — CT CT CHEST HIGH RESOLUTION W/O CM
2 of 6 series · 15 of 36 positions shown, 18 images · non-contrast
Comparison: None.

CLINICAL DATA: Asbestos exposure. Increase shortness of breath with
exertion.

EXAM:
CT CHEST WITHOUT CONTRAST
TECHNIQUE: Multidetector CT imaging of the chest was performed following the
standard protocol without intravenous contrast. High resolution
imaging of the lungs, as well as inspiratory and expiratory imaging,
was performed.

[Series 2: high resolution · axial · 0.74mm/px · z∈[-351,-51]mm · 12 of 168 slices shown, 15 images]
[im 9/168  mediastinal]
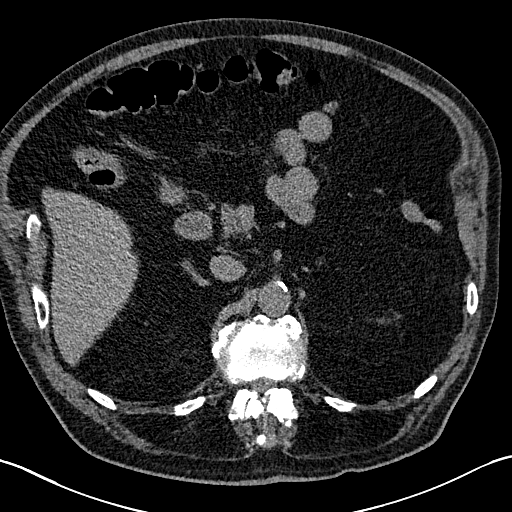
[im 9/168  lung]
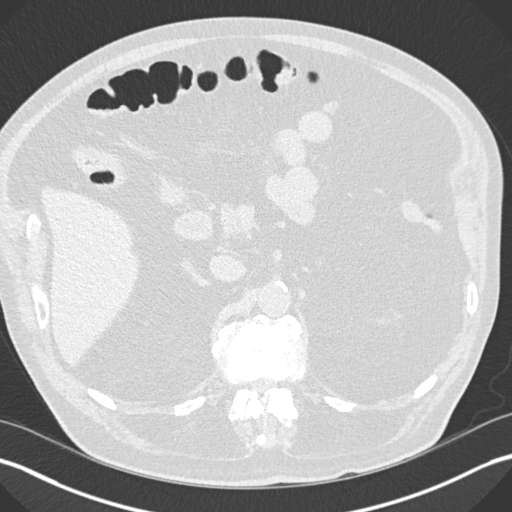
[im 27/168  lung]
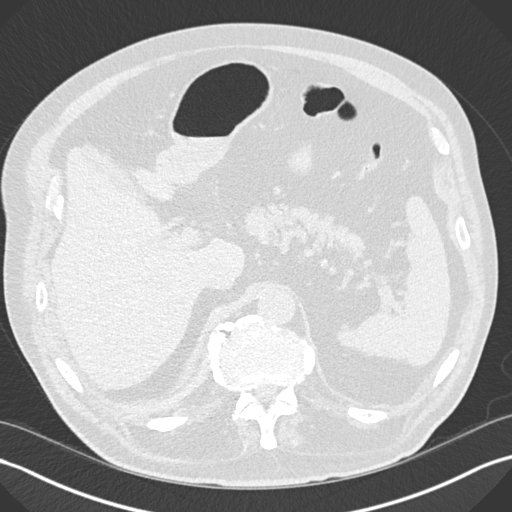
[im 36/168  lung]
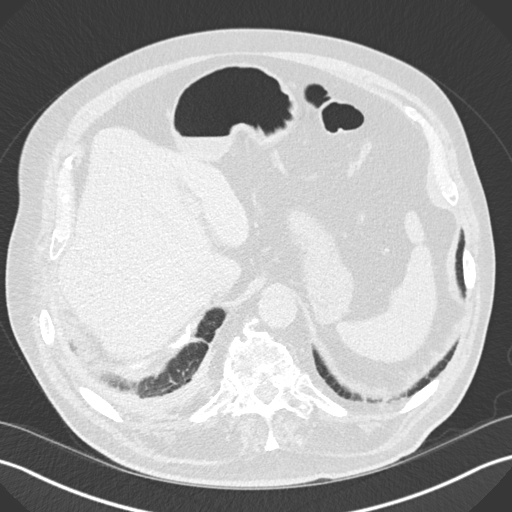
[im 53/168  lung]
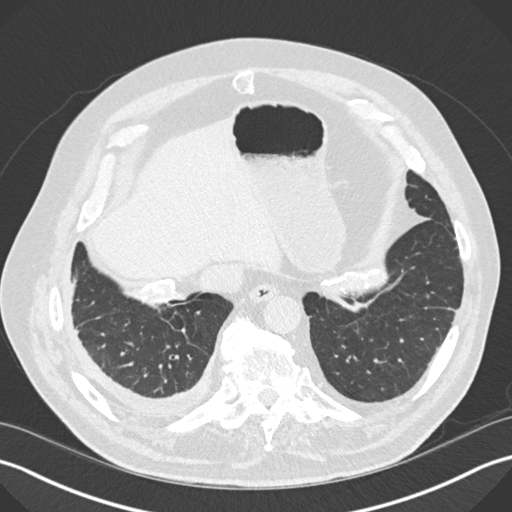
[im 62/168  mediastinal]
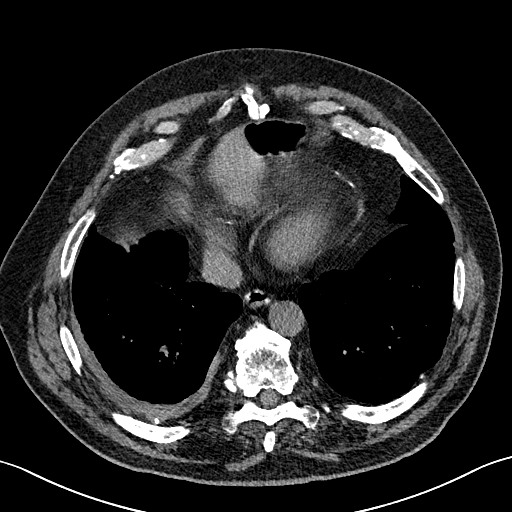
[im 62/168  lung]
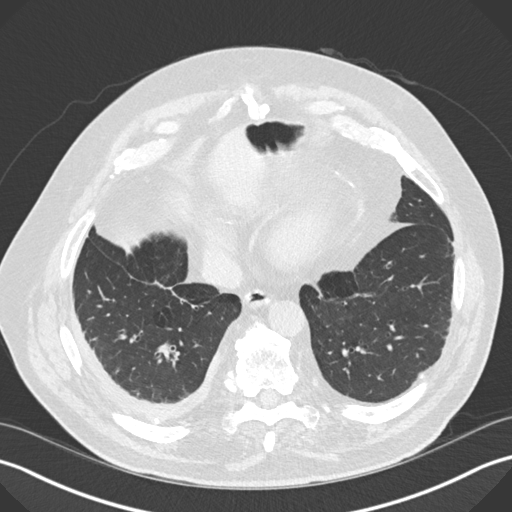
[im 80/168  lung]
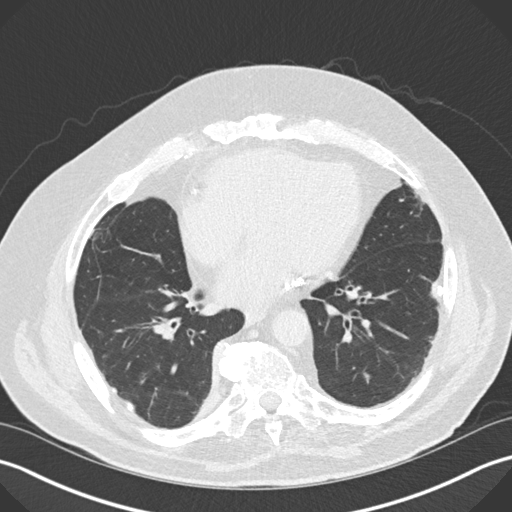
[im 88/168  lung]
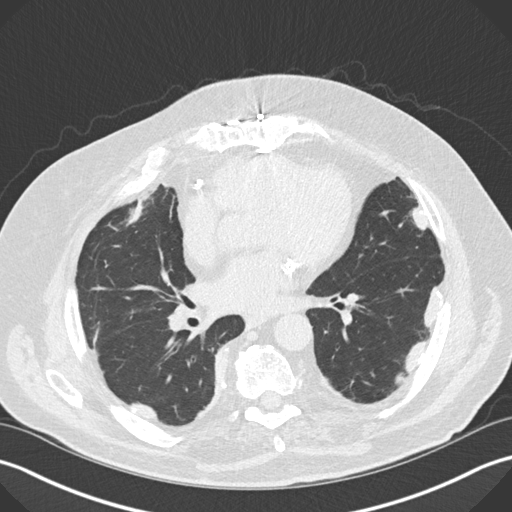
[im 106/168  lung]
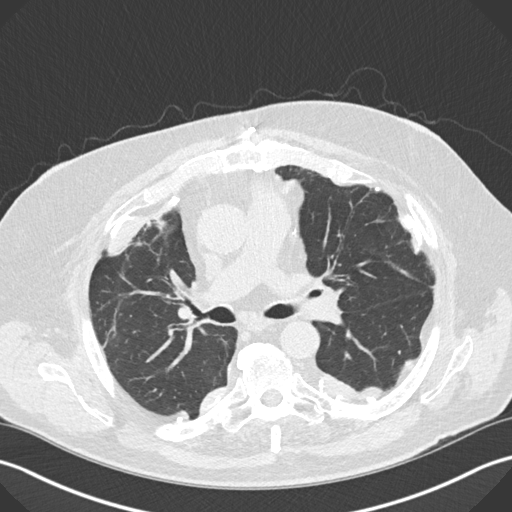
[im 115/168  mediastinal]
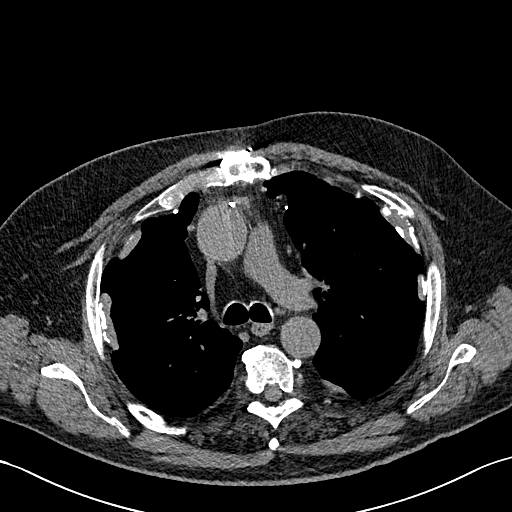
[im 115/168  lung]
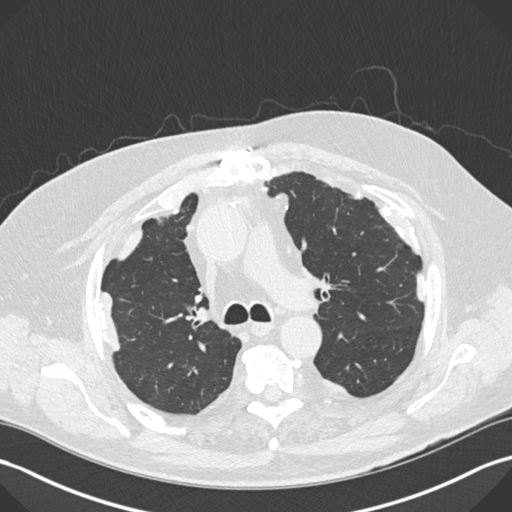
[im 132/168  lung]
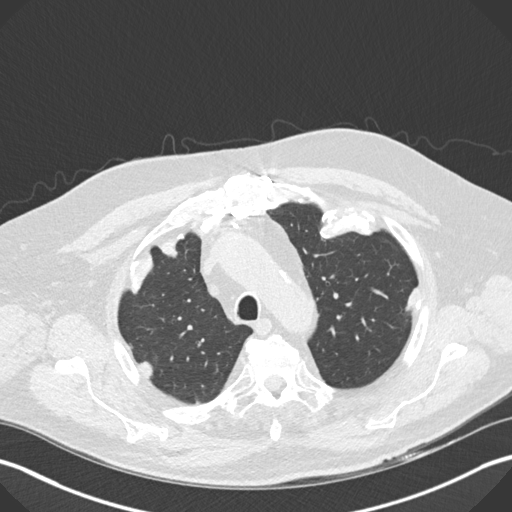
[im 141/168  lung]
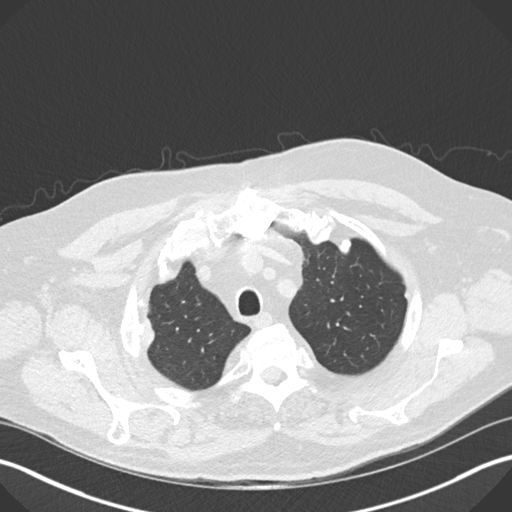
[im 159/168  lung]
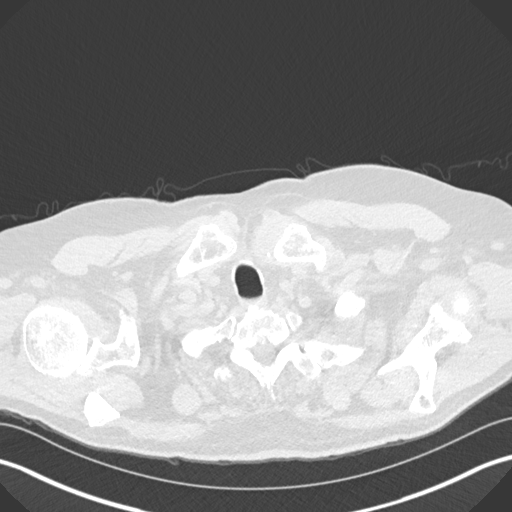

[Series 8: coronal · coronal · 0.66mm/px · 3 of 161 slices shown]
[im 33/161  lung]
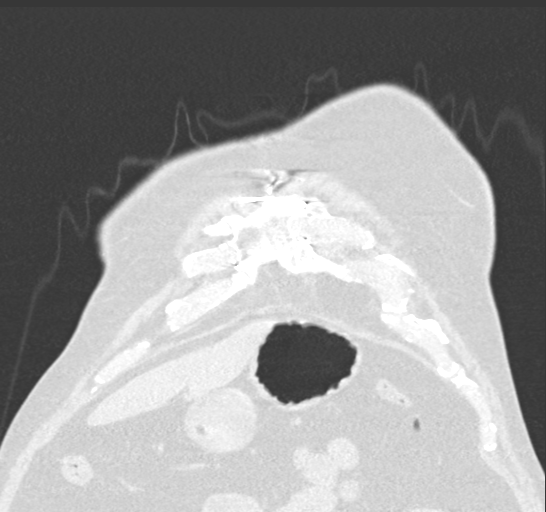
[im 65/161  lung]
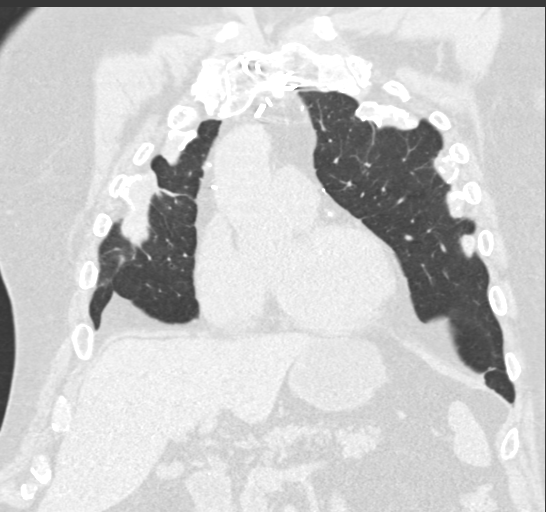
[im 97/161  lung]
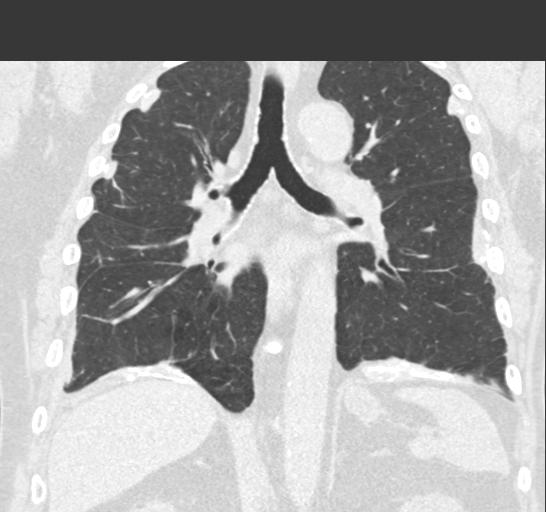

[15 of 36 positions shown; findings below may reference images not displayed]

FINDINGS: Cardiovascular: Atherosclerotic calcification of the arterial
vasculature. Heart is at the upper limits of normal in size to
mildly enlarged. No pericardial effusion.

Mediastinum/Nodes: Mediastinal lymph nodes are not enlarged by CT
size criteria. Hilar regions are difficult to definitively evaluate
without IV contrast. No axillary adenopathy. Esophagus is grossly
unremarkable.

Lungs/Pleura: Extensive calcified pleural plaque formation
bilaterally. Suspect mild basilar predominant subpleural
reticulation with scattered parenchymal bands. Tiny right
fibrothorax. Mild centrilobular emphysema. No additional pleural
fluid. Airway is unremarkable. No air trapping.

Upper Abdomen: Visualized portions of the liver, gallbladder,
adrenal glands, kidneys, spleen, pancreas, stomach and bowel are
grossly unremarkable. No upper abdominal adenopathy.

Musculoskeletal: Degenerative changes in the spine. No worrisome
lytic or sclerotic lesions.
IMPRESSION: 1. Asbestos related pleural disease with pulmonary parenchymal
findings indicative of asbestosis.
2. Tiny right fibrothorax.
3.  Aortic atherosclerosis (E5GHU-170.0).
4.  Emphysema (E5GHU-3PC.B).

## 2019-03-18 ENCOUNTER — Ambulatory Visit: Payer: Medicare HMO | Attending: Internal Medicine

## 2019-03-18 DIAGNOSIS — Z23 Encounter for immunization: Secondary | ICD-10-CM | POA: Insufficient documentation

## 2019-03-18 NOTE — Progress Notes (Signed)
   Covid-19 Vaccination Clinic  Name:  Peter Oneill    MRN: [id number] DOB: 1940/12/18  03/18/2019  [name] was observed post Covid-19 immunization for 15 minutes without incidence. He was provided with Vaccine Information Sheet and instruction to access the V-Safe system.   Mr. Wax was instructed to call 911 with any severe reactions post vaccine: Marland Kitchen Difficulty breathing  . Swelling of your face and throat  . A fast heartbeat  . A bad rash all over your body  . Dizziness and weakness    Immunizations Administered    Name Date Dose VIS Date Route   Pfizer COVID-19 Vaccine 03/18/2019 10:18 AM 0.3 mL 02/04/2019 Intramuscular   Manufacturer: [place]   Lot: [id number]   [place]: [id number]

## 2019-03-31 ENCOUNTER — Other Ambulatory Visit: Payer: Self-pay

## 2019-03-31 ENCOUNTER — Ambulatory Visit (HOSPITAL_COMMUNITY)
Admission: RE | Admit: 2019-03-31 | Discharge: 2019-03-31 | Disposition: A | Discharge status: Home / Self Care | Payer: Medicare HMO | Source: Ambulatory Visit | Attending: Cardiovascular Disease | Admitting: Cardiovascular Disease

## 2019-03-31 ENCOUNTER — Other Ambulatory Visit (HOSPITAL_COMMUNITY): Payer: Self-pay | Admitting: Interventional Cardiology

## 2019-03-31 DIAGNOSIS — I6523 Occlusion and stenosis of bilateral carotid arteries: Secondary | ICD-10-CM | POA: Insufficient documentation

## 2019-03-31 DIAGNOSIS — Z9889 Other specified postprocedural states: Secondary | ICD-10-CM

## 2019-04-08 ENCOUNTER — Ambulatory Visit: Payer: Medicare HMO | Attending: Internal Medicine

## 2019-04-08 DIAGNOSIS — Z23 Encounter for immunization: Secondary | ICD-10-CM | POA: Insufficient documentation

## 2019-04-08 NOTE — Progress Notes (Signed)
   Covid-19 Vaccination Clinic  Name:  Peter Oneill    MRN: [id number] DOB: [birth year]  04/08/2019  Peter Oneill was observed post Covid-19 immunization for 15 minutes without incidence. He was provided with Vaccine Information Sheet and instruction to access the V-Safe system.   Peter Oneill was instructed to call 911 with any severe reactions post vaccine: Marland Kitchen Difficulty breathing  . Swelling of your face and throat  . A fast heartbeat  . A bad rash all over your body  . Dizziness and weakness    Immunizations Administered    Name Date Dose VIS Date Route   Pfizer COVID-19 Vaccine 04/08/2019 12:24 PM 0.3 mL 02/04/2019 Intramuscular   Manufacturer: [place]   Lot: [id number]   [place]: [id number]

## 2019-06-22 ENCOUNTER — Telehealth: Payer: Self-pay | Admitting: Interventional Cardiology

## 2019-06-22 NOTE — Telephone Encounter (Signed)
Ok to hold Plavix 5 day pre procedure.  JV

## 2019-06-22 NOTE — Telephone Encounter (Signed)
   [facility] Pre-operative Risk Assessment    Request for surgical clearance:  1. What type of surgery is being performed? Colonoscopy   2. When is this surgery scheduled? 08/08/19   3. What type of clearance is required (medical clearance vs. Pharmacy clearance to hold med vs. Both)?  Both  4. Are there any medications that need to be held prior to surgery and how long? Plavix   5. Practice name and name of physician performing surgery?  [facility]- Dr. Michail Sermon   6. What is your office phone number? [phone]    7.   What is your office fax number? [phone]  8.   Anesthesia type (None, local, MAC, general) ? Not listed  _________________________________________________________________   (provider comments below)

## 2019-06-22 NOTE — Telephone Encounter (Signed)
   Primary Cardiologist: Larae Grooms, MD  Chart reviewed as part of pre-operative protocol coverage. Patient was contacted 06/22/2019 in reference to pre-operative risk assessment for pending surgery as outlined below.  Hx of extensive CAD with CABG in 2003, cath in 2015 with LIMA-LAD atretic, LAD filled by SVG to diagonal prior to second bypass surgery in 2016. SVG to circumflex is occluded. RCA was stented with a 3.5 x 12, and 3.0 x 8Xience drug-eluting stent. 2015 , he had 3.5 x 15 Xience to the SVG to diagonal. He also has a 3.5 x 22 resolute stent in the circumflex.3.0 x 12 Onyx in the Circ, 2.5 x 12 in the OM in 2017.     Kase Shughart was last seen on 02/09/19 by Dr. Irish Lack.  Spoke briefly with DPR (Wife Georgette) as patient was not available at that the time, who reports since that day, Lamont Tant has done fairly well from a cardiac stand point. No anginal symptoms. Does have shortness of breath at times but this is his baseline. Was seen in the GI office today and BP was 150/70.   Dr. Irish Lack please give recommendations regarding holding plavix. - Please route response to P CV DIV PREOP (the pre-op pool). Thank you.  Reino Bellis, NP 06/22/2019, 3:31 PM

## 2019-06-23 NOTE — Telephone Encounter (Signed)
   Primary Cardiologist: Larae Grooms, MD  Chart reviewed as part of pre-operative protocol coverage. Given past medical history and time since last visit, based on ACC/AHA guidelines, Peter Oneill would be at acceptable risk for the planned procedure without further cardiovascular testing.   Records have been reviewed, and recommendations from Dr. Irish Lack are included below. He will hold Plavix for 5 days prior to the procedure.   I will route this recommendation to the requesting party via Epic fax function and remove from pre-op pool.  Please call with questions.  Phill Myron. Khylah Kendra DNP, ANP, AACC  06/23/2019, 8:22 AM

## 2019-08-24 ENCOUNTER — Other Ambulatory Visit: Payer: Self-pay

## 2019-08-24 ENCOUNTER — Encounter: Payer: Self-pay | Admitting: Pulmonary Disease

## 2019-08-24 ENCOUNTER — Ambulatory Visit (INDEPENDENT_AMBULATORY_CARE_PROVIDER_SITE_OTHER): Payer: Worker's Compensation | Admitting: Pulmonary Disease

## 2019-08-24 VITALS — BP 148/82 | HR 59 | Temp 97.8°F | Ht 69.0 in | Wt 218.4 lb

## 2019-08-24 DIAGNOSIS — J61 Pneumoconiosis due to asbestos and other mineral fibers: Secondary | ICD-10-CM | POA: Diagnosis not present

## 2019-08-24 NOTE — Progress Notes (Signed)
Peter Oneill    [id number]    October 25, 1940  Primary Care Physician:Miller, Lattie Haw, MD  Referring Physician: Kristen Loader, [place],   [id number]  Chief complaint: Follow-up for asbestosis, COPD, emphysema  HPI: Peter Oneill is a 79 year old with past medical history of coronary artery disease status post CABG, hypertension, OSA, asbestosis  He was in the [employer] for 5 years. 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 He reports significant exposure to asbestos for about 28 years in his line of work. He had a chest x-ray that showed interstitial opacities and after a workup he was given a diagnosis of asbestosis in early 1990s and is on Workmen's Compensation. History noted for an episode of hemoptysis in 2001 which was evaluated by CT chest which did not show any evidence of malignancy. He has history of CPAP obstructive sleep apnea and is on CPAP.  He moved from [place] to [place], [place] to be with his daughter and is here to establish care. He was previously followed by Dr. Donneta Romberg, Pulmonologist in [place]. Chief complaint today is chronic dyspnea on exertion, associated with cough, white mucus. He denies hemoptysis, wheezing, fevers, chills. He is not on any inhalers. He had been tried on history. Quit 40 years ago.  Interim history: No new issues.  Continues to have dyspnea on exertion, chronic cough which is nonproductive.  Outpatient Encounter Medications as of 08/24/2019  Medication Sig  . allopurinol (ZYLOPRIM) 300 MG tablet Take 300 mg by mouth daily.  Marland Kitchen aspirin EC 81 MG tablet Take 81 mg by mouth daily.  . carvedilol (COREG) 12.5 MG tablet Take 12.5 mg by mouth 2 (two) times daily with a meal.  . clopidogrel (PLAVIX) 75 MG tablet Take 75 mg by mouth daily.  Marland Kitchen ezetimibe (ZETIA) 10 MG tablet Take 10 mg by mouth daily.  . furosemide (LASIX) 20 MG tablet Take 40 mg by mouth as needed.     . latanoprost (XALATAN) 0.005 % ophthalmic solution Place 1 drop into both eyes at bedtime.  . Multiple Vitamin (MULTIVITAMIN) tablet Take 1 tablet by mouth daily.  . nitroGLYCERIN (NITROSTAT) 0.4 MG SL tablet Place 1 tablet (0.4 mg total) under the tongue every 5 (five) minutes as needed for chest pain.  . Omega-3 Fatty Acids (FISH OIL PO) Take by mouth.  . pantoprazole (PROTONIX) 40 MG tablet Take 1 tablet (40 mg total) by mouth daily as needed.  . rosuvastatin (CRESTOR) 5 MG tablet Take 5 mg by mouth daily.  . Silodosin (RAPAFLO PO) Take 8 mg by mouth daily.   Marland Kitchen umeclidinium-vilanterol (ANORO ELLIPTA) 62.5-25 MCG/INH AEPB INHALE 1 PUFF INTO THE LUNGS DAILY  . [DISCONTINUED] pantoprazole (PROTONIX) 40 MG tablet Take 1 tablet (40 mg total) by mouth daily.   No facility-administered encounter medications on file as of 08/24/2019.   Physical Exam: Blood pressure (!) 148/82, pulse (!) 59, temperature 97.8 F (36.6 C), temperature source Oral, height 5\' 9"  (1.753 m), weight 218 lb 6.4 oz (99.1 kg), SpO2 99 %. Gen:      No acute distress HEENT:  EOMI, sclera anicteric Neck:     No masses; no thyromegaly Lungs:    Clear to auscultation bilaterally; normal respiratory effort CV:         Regular rate and rhythm; no murmurs Abd:      + bowel sounds; soft, non-tender; no palpable masses, no  distension Ext:    No edema; adequate peripheral perfusion Skin:      Warm and dry; no rash Neuro: alert and oriented x 3 Psych: normal mood and affect  Data Reviewed: Data from [facility] PFTs 07/15/11 FVC 2.5 [62%], FEV1 1.91 (62%], FF 76, TLC 73%, diffusion 80%. Mild restrictive lung disease with reduced total lung capacity. Diffusion is low normal. Compared to 2011 there is minimal change in spirometry and a more significant drop in diffusion  CT chest 03/08/03-extensive bilateral calcified pleural plaques. No evidence of interstitial lung disease CT chest 05/24/16-slight interval progression of patient's known  bilateral calcified pleural plaques. There is mild degree of bibasilar interstitial fibrosis. I do not have the images to review.   Date from [facility] PFTs 06/23/2017 FVC 2.47 (3%], FEV1 1.78 [64%], F/F 72, TLC 68%, RV/TLC 131%, DLCO 60%, DLCO/VA 94% Moderate obstruction, severe restriction with air trapping.  Moderate diffusion impairment  High-resolution CT chest 06/11/2017- extensive calcified pleural plaques, subpleural reticulation, mild emphysema.  Aortic atherosclerosis I reviewed the images personally  High-resolution CT [facility] 10/19/2018- Bilateral bulky, partially calcified pleural plaques are consistent with the history of prior asbestos exposure. There is mild bibasilar pleural parenchymal banding, though no honeycombing or reticulation to suggest interstitial lung disease. A small  loculated right pleural effusion is present.   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 His CT scan shows extensive calcified pleural plaques. He'll need to be monitored annually with lung imaging and pulmonary function tests.   COPD, emphysema PFTs and CT scan reviewed with moderate obstruction, air trapping and emphysematous changes Continue Anoro  OSA Not using CPAP.  Readdress at return visit.  Plan/Recommendations: - Continue Anoro - Continue annual monitoring.  Schedule high-res CT and PFTs  Marshell Garfinkel MD [facility] 08/24/2019, 10:35 AM  CC: Kristen Loader, FNP

## 2019-08-24 NOTE — Patient Instructions (Signed)
We will schedule you for high-resolution CT and pulmonary function test in the next 2 to 3 months Continue Anoro inhaler Follow-up in clinic in 3 months after this test.

## 2019-09-06 ENCOUNTER — Telehealth: Payer: Self-pay | Admitting: Pulmonary Disease

## 2019-09-06 NOTE — Telephone Encounter (Signed)
Wife aware that order was faxed

## 2019-09-22 ENCOUNTER — Telehealth: Payer: Self-pay | Admitting: Pulmonary Disease

## 2019-09-22 NOTE — Telephone Encounter (Signed)
Pt called requesting to know the results of his recent CT. Scan was done at [facility] and is able to be seen under the care everywhere tab of pt's chart. This was performed on 09/12/19. Dr. Vaughan Browner, please advise on the results.

## 2019-09-23 NOTE — Telephone Encounter (Signed)
Patient contacted with results of recent CT scan, he verbalized understanding. Patient has follow up appointment at the end of August.

## 2019-09-23 NOTE — Telephone Encounter (Signed)
I do not have the images to review By report the CT looks stable showing changes of asbestos exposure.  No change compared to last year

## 2019-09-27 ENCOUNTER — Telehealth: Payer: Self-pay | Admitting: Interventional Cardiology

## 2019-09-27 NOTE — Telephone Encounter (Signed)
Spoke with Manuela Schwartz with [facility]/ Dr. Sabra Heck and advised her that I will send her message to Dr. Irish Lack for review but he is out of the office this week and it may be until late next week before he has a chance to review the message re: how long the pt will need to stay on the Plavix.   She denies the pt is having any problems but just asked during his visit with them.   I advised her that he has CAD and Carotid Disease so he may be on it chronically but will need to hear back form Dr. Irish Lack and will call and let her know. She was appreciative for the call back and reports it is not a rush and can wait.

## 2019-09-27 NOTE — Telephone Encounter (Signed)
Pt c/o medication issue:  1. Name of Medication: clopidogrel (PLAVIX) 75 MG tablet  2. How are you currently taking this medication (dosage and times per day)? 1 tablet daily  3. Are you having a reaction (difficulty breathing--STAT)? no  4. What is your medication issue? Manuela Schwartz from [facility] calling stating the patient's PCP Dr. Sabra Heck wants to see if the patient needs to be on plavix for life or if he can stop.

## 2019-10-01 NOTE — Telephone Encounter (Signed)
Given his longstanding CAD with CABG and multiple stents over the years, would continue clopidogrel indefinitely as long as he was not having bleeding issues.  If he is having bleeding issues, could stop aspirin and continue clopidogrel since clopidogrel gives better protection for his heart than aspirin.   JV

## 2019-10-03 NOTE — Telephone Encounter (Signed)
Called and made Peter Oneill at Dr. Ammie Ferrier office aware that per Dr. Irish Lack, "Given his longstanding CAD with CABG and multiple stents over the years, would continue clopidogrel indefinitely as long as he was not having bleeding issues.  If he is having bleeding issues, could stop aspirin and continue clopidogrel since clopidogrel gives better protection for his heart than aspirin.". She verbalized understanding and thanked me for the call.

## 2019-10-03 NOTE — Telephone Encounter (Signed)
Left message for Manuela Schwartz at Dr. Ammie Ferrier office to call back.

## 2019-10-03 NOTE — Telephone Encounter (Signed)
Manuela Schwartz from Dr. Ammie Ferrier office returned your call.

## 2019-10-18 ENCOUNTER — Other Ambulatory Visit: Payer: Medicare HMO

## 2019-10-25 ENCOUNTER — Other Ambulatory Visit: Payer: Self-pay

## 2019-10-25 ENCOUNTER — Encounter: Payer: Self-pay | Admitting: Pulmonary Disease

## 2019-10-25 ENCOUNTER — Ambulatory Visit (INDEPENDENT_AMBULATORY_CARE_PROVIDER_SITE_OTHER): Payer: Medicare HMO | Admitting: Pulmonary Disease

## 2019-10-25 VITALS — BP 150/80 | HR 83 | Temp 96.5°F | Ht 68.0 in | Wt 217.0 lb

## 2019-10-25 DIAGNOSIS — J920 Pleural plaque with presence of asbestos: Secondary | ICD-10-CM

## 2019-10-25 DIAGNOSIS — R0602 Shortness of breath: Secondary | ICD-10-CM

## 2019-10-25 DIAGNOSIS — J61 Pneumoconiosis due to asbestos and other mineral fibers: Secondary | ICD-10-CM

## 2019-10-25 LAB — PULMONARY FUNCTION TEST
DL/VA % pred: 118 %
DL/VA: 4.7 ml/min/mmHg/L
DLCO cor % pred: 88 %
DLCO cor: 20.52 ml/min/mmHg
DLCO unc % pred: 82 %
DLCO unc: 19.19 ml/min/mmHg
FEF 25-75 Post: 1.68 L/sec
FEF 25-75 Pre: 1.11 L/sec
FEF2575-%Change-Post: 51 %
FEF2575-%Pred-Post: 88 %
FEF2575-%Pred-Pre: 58 %
FEV1-%Change-Post: 9 %
FEV1-%Pred-Post: 71 %
FEV1-%Pred-Pre: 64 %
FEV1-Post: 1.92 L
FEV1-Pre: 1.75 L
FEV1FVC-%Change-Post: 7 %
FEV1FVC-%Pred-Pre: 99 %
FEV6-%Change-Post: 1 %
FEV6-%Pred-Post: 70 %
FEV6-%Pred-Pre: 69 %
FEV6-Post: 2.49 L
FEV6-Pre: 2.44 L
FEV6FVC-%Pred-Post: 107 %
FEV6FVC-%Pred-Pre: 107 %
FVC-%Change-Post: 1 %
FVC-%Pred-Post: 65 %
FVC-%Pred-Pre: 64 %
FVC-Post: 2.49 L
FVC-Pre: 2.45 L
Post FEV1/FVC ratio: 77 %
Post FEV6/FVC ratio: 100 %
Pre FEV1/FVC ratio: 71 %
Pre FEV6/FVC Ratio: 100 %
RV % pred: 89 %
RV: 2.24 L
TLC % pred: 71 %
TLC: 4.73 L

## 2019-10-25 NOTE — Progress Notes (Signed)
Peter Oneill    [id number]    February 13, 1941  Primary Care Physician:Miller, Lattie Haw, MD  Referring Physician: [name], MD [place],  [place] [address]  Chief complaint: Follow-up for asbestosis, COPD, emphysema  HPI: Peter Oneill is a 79 year old with past medical history of coronary artery disease status post CABG, hypertension, OSA, asbestosis  He was in the [employer] for 5 years. 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 He reports significant exposure to asbestos for about 28 years in his line of work. He had a chest x-ray that showed interstitial opacities and after a workup he was given a diagnosis of asbestosis in early 1990s and is on Workmen's Compensation. History noted for an episode of hemoptysis in 2001 which was evaluated by CT chest which did not show any evidence of malignancy. He has history of CPAP obstructive sleep apnea and is on CPAP.  He moved from [place] to [place], [place] to be with his daughter and is here to establish care. He was previously followed by Dr. Donneta Romberg, Pulmonologist in [place]. Chief complaint today is chronic dyspnea on exertion, associated with cough, white mucus. He denies hemoptysis, wheezing, fevers, chills. He is not on any inhalers. He had been tried on history. Quit 40 years ago.  Interim history: Stable with regard to breathing.  Has chronic dyspnea on exertion Has started an exercise regimen with stationary bike.  No new complaints today.  Outpatient Encounter Medications as of 10/25/2019  Medication Sig  . allopurinol (ZYLOPRIM) 300 MG tablet Take 300 mg by mouth daily.  Marland Kitchen aspirin EC 81 MG tablet Take 81 mg by mouth daily.  . carvedilol (COREG) 12.5 MG tablet Take 12.5 mg by mouth 2 (two) times daily with a meal.  . clopidogrel (PLAVIX) 75 MG tablet Take 75 mg by mouth daily.  Marland Kitchen ezetimibe (ZETIA) 10 MG tablet Take 10 mg by mouth daily.  . furosemide  (LASIX) 20 MG tablet Take 40 mg by mouth as needed.   . latanoprost (XALATAN) 0.005 % ophthalmic solution Place 1 drop into both eyes at bedtime.  . Multiple Vitamin (MULTIVITAMIN) tablet Take 1 tablet by mouth daily.  . nitroGLYCERIN (NITROSTAT) 0.4 MG SL tablet Place 1 tablet (0.4 mg total) under the tongue every 5 (five) minutes as needed for chest pain.  . Omega-3 Fatty Acids (FISH OIL PO) Take by mouth.  . pantoprazole (PROTONIX) 40 MG tablet Take 1 tablet (40 mg total) by mouth daily as needed.  . rosuvastatin (CRESTOR) 5 MG tablet Take 5 mg by mouth daily.  . Silodosin (RAPAFLO PO) Take 8 mg by mouth daily.   Marland Kitchen umeclidinium-vilanterol (ANORO ELLIPTA) 62.5-25 MCG/INH AEPB INHALE 1 PUFF INTO THE LUNGS DAILY   No facility-administered encounter medications on file as of 10/25/2019.   Physical Exam: Blood pressure (!) 150/80, pulse 83, temperature (!) 96.5 F (35.8 C), temperature source Temporal, height 5\' 8"  (1.727 m), weight 217 lb (98.4 kg), SpO2 98 %. Gen:      No acute distress HEENT:  EOMI, sclera anicteric Neck:     No masses; no thyromegaly Lungs:    Clear to auscultation bilaterally; normal respiratory effort CV:         Regular rate and rhythm; no murmurs Abd:      + bowel sounds; soft, non-tender; no palpable masses, no distension Ext:    No edema; adequate peripheral perfusion Skin:  Warm and dry; no rash Neuro: alert and oriented x 3 Psych: normal mood and affect  Data Reviewed: Data from [facility] PFTs 07/15/11 FVC 2.5 [62%], FEV1 1.91 (62%], FF 76, TLC 73%, diffusion 80%. Mild restrictive lung disease with reduced total lung capacity. Diffusion is low normal. Compared to 2011 there is minimal change in spirometry and a more significant drop in diffusion  CT chest 03/08/03-extensive bilateral calcified pleural plaques. No evidence of interstitial lung disease CT chest 05/24/16-slight interval progression of patient's known bilateral calcified pleural plaques. There is  mild degree of bibasilar interstitial fibrosis. I do not have the images to review.   Date from [facility] PFTs 06/23/2017 FVC 2.47 (3%], FEV1 1.78 [64%], F/F 72, TLC 68%, RV/TLC 131%, DLCO 60%, DLCO/VA 94% Moderate obstruction, severe restriction with air trapping.  Moderate diffusion impairment  High-resolution CT chest 06/11/2017- extensive calcified pleural plaques, subpleural reticulation, mild emphysema.  Aortic atherosclerosis I reviewed the images personally  High-resolution CT [facility] 10/19/2018- Bilateral bulky, partially calcified pleural plaques are consistent with the history of prior asbestos exposure. There is mild bibasilar pleural parenchymal banding, though no honeycombing or reticulation to suggest interstitial lung disease. A small loculated right pleural effusion is present.   High-resolution CT [facility] 09/12/2019-no interstitial lung disease, stable stable bulky bilateral partially calcified pleural plaques and a small chronic right pleural effusion.    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 His CT scan shows extensive calcified pleural plaques. He'll need to be monitored annually with lung imaging and pulmonary function tests.  Unfortunately he gets his imaging at [facility] as this is where his workman comp sends him.  We are unable to review images but can see the report  COPD, emphysema Continue Anoro PFTs reviewed with improvement in obstruction and restriction or diffusion impairment This is likely due to his recent exercise regimen.  Congratulated him and encouraged him to continue  OSA Intolerant of CPAP.  Does not want to retry  Plan/Recommendations: - Continue Anoro - Continue annual monitoring.    Marshell Garfinkel MD [facility] 10/25/2019, 11:48 AM  CC: [name], MD

## 2019-10-25 NOTE — Patient Instructions (Signed)
His CT chest looks okay PFTs show improvement in lung function which is good news Continue the exercise program Follow-up in 1 year.

## 2019-10-25 NOTE — Progress Notes (Signed)
Full PFT performed today. °

## 2019-11-14 ENCOUNTER — Other Ambulatory Visit: Payer: Self-pay | Admitting: Pulmonary Disease

## 2020-02-07 NOTE — Progress Notes (Signed)
Cardiology Office Note   Date:  02/09/2020   ID:  Peter Oneill, DOB 12/18/1940, MRN [id number]  PCP:  [name], MD    No chief complaint on file.  CAD  Wt Readings from Last 3 Encounters:  02/09/20 218 lb 9.6 oz (99.2 kg)  10/25/19 217 lb (98.4 kg)  08/24/19 218 lb 6.4 oz (99.1 kg)       History of Present Illness: Peter Oneill is a 79 y.o. male  Who has had extensive CAD.3.5 x 13 Cypher, and 3.5 x 18 Cypher in 2003. He thenhad CABG in 2003 after having burning in his chest, which occurred with walking/hunting. A few years later, he required several stents, (2004; 3.5 x 18 Cypher). Details are not available at this time. He required repeat CABG in 12/16, with RIMA to LAD and SVG to diagonal. Last cath was 10/17 in which a stent was placed. THat cath was done at [place], [place].   Additional anatomic information from prior records from 2015 cath: LIMA to LAD is atretic. LAD filled by SVG to diagonal prior to second bypass surgery in 2016. SVG to circumflex is occluded. This information comes from a cardiac cath report from 2015. At that timein 2015, the RCA was stented with a 3.5 x 12, and 3.0 x 8Xience drug-eluting stent. 2015 , he had 3.5 x 15 Xience to the SVG to diagonal.  He also has a 3.5 x 22 resolute stent in the circumflex.3.0 x 12 Onyx in the Circ, 2.5 x 12 in the OM (2017)  He had CEA in 1996  On 12/25/2018, he had persistent chest burning that lasted a fewminutes. He did not use NTG or seek attention in the ER. He has not had issues with walking or mowing the lawn. It occurred while he would lie on his right side, and resolved when he changed positions.  He fell in the shower around Thanksgiving 2020.  He had a big bruise.   We had tried Protonix for the chest burning that he had on the right side.  It worked well and there was no residual burning.  He still has some residual back pain from the fall, but it improved.   Since the  last visit, no further chest pain.   Denies : Chest pain. Dizziness. Leg edema. Nitroglycerin use. Orthopnea. Palpitations. Paroxysmal nocturnal dyspnea. Shortness of breath. Syncope.   Walks daily outside and around the house.  Yard work as well.  Walks dogs.  No falls.   Wife checks BP at home.  Readings fluctuate at home.  [id number] systolic.        Past Medical History:  Diagnosis Date  . Asbestosis ([place])   . Contraindication to percutaneous coronary intervention (PCI)   . Coronary artery disease   . Dyslipidemia   . Hypercholesterolemia   . Hypertension   . Ileus ([place])   . Pleural effusion on left   . Renal failure   . Sleep apnea     Past Surgical History:  Procedure Laterality Date  . BACK SURGERY    . CARDIAC CATHETERIZATION    . CAROTID ENDARTERECTOMY    . CARPAL TUNNEL RELEASE    . CORONARY ARTERY BYPASS GRAFT    . HERNIA REPAIR    . PERCUTANEOUS CORONARY STENT INTERVENTION (PCI-S)     multiple  . STERNOTOMY       Current Outpatient Medications  Medication Sig Dispense Refill  . allopurinol (ZYLOPRIM) 300 MG tablet Take 300 mg by  mouth daily.    Jearl Klinefelter ELLIPTA 62.5-25 MCG/INH AEPB USE 1 INHALATION ONCE DAILY 180 each 3  . aspirin EC 81 MG tablet Take 81 mg by mouth daily.    . carvedilol (COREG) 12.5 MG tablet Take 12.5 mg by mouth 2 (two) times daily with a meal.    . clopidogrel (PLAVIX) 75 MG tablet Take 75 mg by mouth daily.    Marland Kitchen ezetimibe (ZETIA) 10 MG tablet Take 10 mg by mouth daily.    . furosemide (LASIX) 20 MG tablet Take 40 mg by mouth as needed.     . latanoprost (XALATAN) 0.005 % ophthalmic solution Place 1 drop into both eyes at bedtime.    . Multiple Vitamin (MULTIVITAMIN) tablet Take 1 tablet by mouth daily.    . nitroGLYCERIN (NITROSTAT) 0.4 MG SL tablet Place 1 tablet (0.4 mg total) under the tongue every 5 (five) minutes as needed for chest pain. 25 tablet 3  . Omega-3 Fatty Acids (FISH OIL PO) Take by mouth.    . pantoprazole  (PROTONIX) 40 MG tablet Take 1 tablet (40 mg total) by mouth daily as needed. 90 tablet 3  . rosuvastatin (CRESTOR) 5 MG tablet Take 5 mg by mouth daily.    . Silodosin (RAPAFLO PO) Take 8 mg by mouth daily.      No current facility-administered medications for this visit.    Allergies:   Atorvastatin    Social History:  The patient  reports that he quit smoking about 43 years ago. His smoking use included cigarettes. He has a 4.75 pack-year smoking history. He has never used smokeless tobacco. He reports current alcohol use. He reports that he does not use drugs.   Family History:  The patient's family history includes Heart disease in his father.    ROS:  Please see the history of present illness.   Otherwise, review of systems are positive for using a cane for balance.   All other systems are reviewed and negative.    PHYSICAL EXAM: VS:  BP (!) 160/78   Pulse (!) 54   Ht 5\' 8"  (1.727 m)   Wt 218 lb 9.6 oz (99.2 kg)   SpO2 93%   BMI 33.24 kg/m  , BMI Body mass index is 33.24 kg/m. GEN: Well nourished, well developed, in no acute distress  HEENT: normal  Neck: no JVD, carotid bruits, or masses Cardiac: RRR- short pauses; no murmurs, rubs, or gallops,no edema  Respiratory:  clear to auscultation bilaterally, normal work of breathing GI: soft, nontender, nondistended, + BS MS: no deformity or atrophy  Skin: warm and dry, no rash Neuro:  Strength and sensation are intact Psych: euthymic mood, full affect   EKG:   The ekg ordered today demonstrates NSR with Mobitz type I, nearly 2 second pause   Recent Labs: No results found for requested labs within last 8760 hours.   Lipid Panel    Component Value Date/Time   CHOL 125 03/17/2016 1226   TRIG 92 03/17/2016 1226   HDL 41 03/17/2016 1226   CHOLHDL 3.0 03/17/2016 1226   LDLCALC 66 03/17/2016 1226     Other studies Reviewed: Additional studies/ records that were reviewed today with results demonstrating: Prior ECG  reviewed which did not show any pauses, heart rate 73.   ASSESSMENT AND PLAN:  1. CAD: No angina. Continue aggressive secondary prevention.  Some bradycardia with pauses around 2 seconds. For now, decrease carvedilol to 6.25 mg twice daily.  After a week,  will decrease further to 3.125 mg twice daily.  It seems likely his blood pressure may increase with this.  Will add amlodipine 5 mg daily.  If heart rate is still slow, will have to stop carvedilol.  Given his history of heart disease, would like to be able to continue some carvedilol. 2. Carotid artery disease: moderate carotid disease.  Repeat in 2/22. 3. Hyperlipidemia: The current medical regimen is effective;  continue present plan and medications. 4. CRI: Cr 2.5.  Limited use of ACE-I. 5. GERD: Improved with protonix.    Current medicines are reviewed at length with the patient today.  The patient concerns regarding his medicines were addressed.  The following changes have been made:  No change  Labs/ tests ordered today include:  No orders of the defined types were placed in this encounter.   Recommend 150 minutes/week of aerobic exercise Low fat, low carb, high fiber diet recommended  Disposition:   FU in 2-3 weeks w/ pharmD HTN clinic.  I spoke to his wife by phone to explain the plan.  He is also bring his blood pressure cuff when he comes for this visit.   Signed, Larae Grooms, MD  02/09/2020 10:53 AM    [address], [place], [place]  [address] Phone: [phone]; Fax: [phone]

## 2020-02-09 ENCOUNTER — Other Ambulatory Visit: Payer: Self-pay

## 2020-02-09 ENCOUNTER — Encounter: Payer: Self-pay | Admitting: Interventional Cardiology

## 2020-02-09 ENCOUNTER — Ambulatory Visit: Payer: Medicare HMO | Admitting: Interventional Cardiology

## 2020-02-09 VITALS — BP 160/78 | HR 54 | Ht 68.0 in | Wt 218.6 lb

## 2020-02-09 DIAGNOSIS — N183 Chronic kidney disease, stage 3 unspecified: Secondary | ICD-10-CM | POA: Diagnosis not present

## 2020-02-09 DIAGNOSIS — I25119 Atherosclerotic heart disease of native coronary artery with unspecified angina pectoris: Secondary | ICD-10-CM

## 2020-02-09 DIAGNOSIS — I6523 Occlusion and stenosis of bilateral carotid arteries: Secondary | ICD-10-CM

## 2020-02-09 DIAGNOSIS — E782 Mixed hyperlipidemia: Secondary | ICD-10-CM | POA: Diagnosis not present

## 2020-02-09 DIAGNOSIS — I10 Essential (primary) hypertension: Secondary | ICD-10-CM

## 2020-02-09 MED ORDER — CARVEDILOL 3.125 MG PO TABS: 3.1250 mg | ORAL_TABLET | Freq: Two times a day (BID) | ORAL | 3 refills | Status: DC

## 2020-02-09 MED ORDER — AMLODIPINE BESYLATE 5 MG PO TABS: 5.0000 mg | ORAL_TABLET | Freq: Every day | ORAL | 3 refills | Status: DC

## 2020-02-09 NOTE — Patient Instructions (Addendum)
Medication Instructions:  Your physician has recommended you make the following change in your medication: Decrease Carvedilol to 6.25 mg twice daily for one week and then decrease to 3.125 mg by mouth twice daily Start Amlodipine 5 mg by mouth daily  *If you need a refill on your cardiac medications before your next appointment, please call your pharmacy*   Lab Work: none If you have labs (blood work) drawn today and your tests are completely normal, you will receive your results only by: Marland Kitchen MyChart Message (if you have MyChart) OR . A paper copy in the mail If you have any lab test that is abnormal or we need to change your treatment, we will call you to review the results.   Testing/Procedures: none   Follow-Up: At [facility], you and your health needs are our priority.  As part of our continuing mission to provide you with exceptional heart care, we have created designated Provider Care Teams.  These Care Teams include your primary Cardiologist (physician) and Advanced Practice Providers (APPs -  Physician Assistants and Nurse Practitioners) who all work together to provide you with the care you need, when you need it.  We recommend signing up for the patient portal called "MyChart".  Sign up information is provided on this After Visit Summary.  MyChart is used to connect with patients for Virtual Visits (Telemedicine).  Patients are able to view lab/test results, encounter notes, upcoming appointments, etc.  Non-urgent messages can be sent to your provider as well.   To learn more about what you can do with MyChart, go to [email or web address].    Your next appointment:   June 17,2022 at 9:00  The format for your next appointment:   In Person  Provider:   Casandra Doffing, MD   Other Instructions  You have been referred to see the pharmacist in our office for blood pressure follow up.  Appointment is January 4,2022 at 10:30.  Bring your blood pressure cuff and any home blood  pressure readings to this appointment

## 2020-02-27 NOTE — Progress Notes (Signed)
Patient ID: Peter Oneill                 DOB: Apr 30, 1940                      MRN: [id number]     HPI: Peter Oneill is a 80 y.o. male referred by Dr. Irish Lack to HTN clinic. PMH is significant for extensive CAD.3.5 x 13 Cypher, and 3.5 x 18 Cypher in 2003, 2004, and 2017, CABG in 2003 and 2021, CEA in 1996, chronic renal insufficiency stage 3, HLD. Last seen by Dr. Irish Lack on 02/09/20 during which time BP was elevated at 160/78 and HR 54. Carvedilol was decreased from 6.25 mg to 3.125 mg BID due to bradycardia and amlodipine 5 mg daily was added.  Pt presents today in good spirits with his wife. He reports tolerating his medication changes well. Denies dizziness, blurred vision, headache, and swelling. His wife is a retired Therapist, sports and checks his BP at home using an automatic bicep cuff that they've had for 5-6 years. BP has ranged 131/67 - 178/77, HR 50-83, all checked during various times of the day. Home cuff is measuring BP accurately - home cuff reading 150/82, clinic reading 154/80. Denies NSAID use, uses Tylenol prn. Does add some salt to his food, activity is limited by breathing issues.  Pt sees his PCP on 1/25 for annual follow up and states they will draw lab work. LDL was 100 last January. Thinks his rosuvastatin dose was increased from 5 to 10mg  some time last year. Reports tolerating rosuvastatin and ezetimibe well. Previously experienced muscle pain on atorvastatin. Has not tried higher doses of rosuvastatin.   Current HTN meds: amlodipine 5 mg daily (8am), carvedilol 3.125 mg BID (8-9am and 9pm) Previously tried: none BP goal: <130/80 mmHg  Family History: Heart disease in his father.  Social History: Former Smoker (Quit: 12/10/1976); reports current alcohol use. He reports that he does not use drugs.   Diet: 1 cup of coffee in the AM. Drinks diet soda or iced team occasionally. Likes juice and milk. Does add some salt to his food.  Exercise: Minimal due to breathing problems,  ambulates with a cane.  Home BP readings:  152/78, 131/67, 146/74, 134/62, 142/46, 178/77 yesterday HR 50, 78, 66, 83, 65  Wt Readings from Last 3 Encounters:  02/09/20 218 lb 9.6 oz (99.2 kg)  10/25/19 217 lb (98.4 kg)  08/24/19 218 lb 6.4 oz (99.1 kg)   BP Readings from Last 3 Encounters:  02/09/20 (!) 160/78  10/25/19 (!) 150/80  08/24/19 (!) 148/82   Pulse Readings from Last 3 Encounters:  02/09/20 (!) 54  10/25/19 83  08/24/19 (!) 59    Renal function: CrCl cannot be calculated (No successful lab value found.).  Past Medical History:  Diagnosis Date  . Asbestosis ([place])   . Contraindication to percutaneous coronary intervention (PCI)   . Coronary artery disease   . Dyslipidemia   . Hypercholesterolemia   . Hypertension   . Ileus ([place])   . Pleural effusion on left   . Renal failure   . Sleep apnea     Current Outpatient Medications on File Prior to Visit  Medication Sig Dispense Refill  . allopurinol (ZYLOPRIM) 300 MG tablet Take 300 mg by mouth daily.    Marland Kitchen amLODipine (NORVASC) 5 MG tablet Take 1 tablet (5 mg total) by mouth daily. 90 tablet 3  . ANORO ELLIPTA 62.5-25 MCG/INH AEPB USE 1 INHALATION ONCE  DAILY 180 each 3  . aspirin EC 81 MG tablet Take 81 mg by mouth daily.    . carvedilol (COREG) 3.125 MG tablet Take 1 tablet (3.125 mg total) by mouth 2 (two) times daily. 180 tablet 3  . clopidogrel (PLAVIX) 75 MG tablet Take 75 mg by mouth daily.    Marland Kitchen ezetimibe (ZETIA) 10 MG tablet Take 10 mg by mouth daily.    . furosemide (LASIX) 20 MG tablet Take 40 mg by mouth as needed.     . latanoprost (XALATAN) 0.005 % ophthalmic solution Place 1 drop into both eyes at bedtime.    . Multiple Vitamin (MULTIVITAMIN) tablet Take 1 tablet by mouth daily.    . nitroGLYCERIN (NITROSTAT) 0.4 MG SL tablet Place 1 tablet (0.4 mg total) under the tongue every 5 (five) minutes as needed for chest pain. 25 tablet 3  . Omega-3 Fatty Acids (FISH OIL PO) Take by mouth.    .  pantoprazole (PROTONIX) 40 MG tablet Take 1 tablet (40 mg total) by mouth daily as needed. 90 tablet 3  . rosuvastatin (CRESTOR) 5 MG tablet Take 5 mg by mouth daily.    . Silodosin (RAPAFLO PO) Take 8 mg by mouth daily.      No current facility-administered medications on file prior to visit.    Allergies  Allergen Reactions  . Atorvastatin Other (See Comments)     Assessment/Plan:  1. Hypertension - BP has improved from last visit, but remains elevated above goal < 130/27mmHg. Will increase amlodipine from 5mg  to 10mg  and continue lower dose of carvedilol 3.125mg  BID (HR has improved). Pt encouraged to limit daily sodium intake to < 2,000mg . Home BP cuff has been measuring accurately. Encouraged pt to continue monitoring and recording BP readings. Will call pt in 3 weeks to follow up with BP readings.  2. Hyperlipidemia - LDL 100mg /dL in January 2020 at PCP, above goal 70mg /dL given ASCVD, could also target more aggressive goal 55mg /dL per AACE and ESC guidelines. Rosuvastatin dose was increased to 10mg  daily and pt continues on ezetimibe 10mg  daily. PCP will recheck labs in a few weeks and will plan to titrate rosuvastatin dose if needed.  Madilynne Mullan E. Emileigh Kellett, PharmD, BCACP, [place] [address] [place], [place] [address] Phone: [phone]; Fax: [phone] 02/28/2020 11:30 AM

## 2020-02-28 ENCOUNTER — Ambulatory Visit (INDEPENDENT_AMBULATORY_CARE_PROVIDER_SITE_OTHER): Payer: Medicare HMO | Admitting: Pharmacist

## 2020-02-28 ENCOUNTER — Other Ambulatory Visit: Payer: Self-pay

## 2020-02-28 DIAGNOSIS — I10 Essential (primary) hypertension: Secondary | ICD-10-CM

## 2020-02-28 MED ORDER — AMLODIPINE BESYLATE 10 MG PO TABS: 10.0000 mg | ORAL_TABLET | Freq: Every day | ORAL | 3 refills | Status: DC

## 2020-02-28 NOTE — Patient Instructions (Addendum)
It was nice to meet you today  Your blood pressure goal is < 130/38mmHg  INCREASE your amlodipine from 5mg  to 10mg  once daily. You can double up and take 2 of the 5mg  tablets each day until you run out. Your next refill should be for there higher 10mg  dose to take 1 tablet daily  Continue taking your other medications  Have your primary care doctor check your cholesterol when you see her later this month. Your LDL goal is < 70 and your rosuvastatin dose may need to be increased  I'll give you a call on 1/27 at 2pm to follow up with your blood pressure readings

## 2020-03-22 ENCOUNTER — Other Ambulatory Visit: Payer: Self-pay

## 2020-03-22 ENCOUNTER — Telehealth (INDEPENDENT_AMBULATORY_CARE_PROVIDER_SITE_OTHER): Payer: Medicare HMO | Admitting: Pharmacist

## 2020-03-22 VITALS — BP 150/70 | HR 83

## 2020-03-22 DIAGNOSIS — I10 Essential (primary) hypertension: Secondary | ICD-10-CM

## 2020-03-22 DIAGNOSIS — I25119 Atherosclerotic heart disease of native coronary artery with unspecified angina pectoris: Secondary | ICD-10-CM

## 2020-03-22 MED ORDER — HYDRALAZINE HCL 25 MG PO TABS: 25.0000 mg | ORAL_TABLET | Freq: Three times a day (TID) | ORAL | 11 refills | Status: DC

## 2020-03-22 NOTE — Progress Notes (Signed)
Patient ID: Peter Oneill                 DOB: March 29, 1940                      MRN: [id number]     HPI: Jayel Fladung is a 80 y.o. male referred by Dr. Irish Lack to HTN clinic. PMH is significant for extensive CAD.3.5 x 13 Cypher, and 3.5 x 18 Cypher in 2003, 2004, and 2017, CABG in 2003 and 2021, CEA in 1996, chronic renal insufficiency stage 3, HLD. Last seen by Dr. Irish Lack on 02/09/20 during which time BP was elevated at 160/78 and HR 54. Carvedilol was decreased from 6.25 mg to 3.125 mg BID due to bradycardia and amlodipine 5 mg daily was added. At last visit with me 3 weeks ago, BP remained elevated in clinic 154/80 and at home. His amlodipine was increased to '10mg'$  daily.  Follow up conducted today via telephone. Pt reports tolerating higher dose of amlodipine well. Denies dizziness or balance problems. Notes mild LEE, has been taking 1 furosemide '20mg'$  tablet daily. BP has been checked by his wife 5x since last visit (she is a retired Therapist, sports): 142/66, 146/60, 168/70, 150/70, 169/84, HR have been 70, 64, 47, 83, 74. Uses bicep cuff he's had for 5-6 years, cuff measures accurately (checked at last visit). Limiting salt in his diet. Activity limited by breathing issues.Does have back pain, uses Tylenol and Voltaren gel, still has some pain. Knows to avoid NSAIDs.  Follows with Dr Edrick Oh at [facility], last visit was in Dec. K was 5.5 last July, improved to 5.1 at PCP office 2 days ago. LDL also excellent at 42, pt tolerating rosuvastatin '10mg'$  daily and ezetimibe '10mg'$  daily. Prior myalgias on atorvastatin. Never tried higher doses of rosuvastatin.  Current HTN meds: amlodipine '10mg'$  daily (8am), carvedilol 3.125 mg BID (8-9am and 9pm) Previously tried: none BP goal: <130/80 mmHg  Family History: Heart disease in his father.  Social History: Former Smoker (Quit: 12/10/1976); reports current alcohol use. He reports that he does not use drugs.   Diet: 1 cup of coffee in the AM.  Drinks diet soda or iced team occasionally. Likes juice and milk. Does add some salt to his food.  Exercise: Minimal due to breathing problems, ambulates with a cane.  Home BP readings:  152/78, 131/67, 146/74, 134/62, 142/46, 178/77 yesterday HR 50, 78, 66, 83, 65  Labs: 03/20/20 [facility]: TC 108, HDL 48, LDL 42, nonHDL 60, TG 92                          SCr 2.77, Na 144, K 5.1, BUN 46  Wt Readings from Last 3 Encounters:  02/09/20 218 lb 9.6 oz (99.2 kg)  10/25/19 217 lb (98.4 kg)  08/24/19 218 lb 6.4 oz (99.1 kg)   BP Readings from Last 3 Encounters:  02/28/20 (!) 154/80  02/09/20 (!) 160/78  10/25/19 (!) 150/80   Pulse Readings from Last 3 Encounters:  02/28/20 81  02/09/20 (!) 54  10/25/19 83    Renal function: CrCl cannot be calculated (No successful lab value found.).  Past Medical History:  Diagnosis Date  . Asbestosis ([place])   . Contraindication to percutaneous coronary intervention (PCI)   . Coronary artery disease   . Dyslipidemia   . Hypercholesterolemia   . Hypertension   . Ileus ([place])   . Pleural effusion on left   .  Renal failure   . Sleep apnea     Current Outpatient Medications on File Prior to Visit  Medication Sig Dispense Refill  . allopurinol (ZYLOPRIM) 300 MG tablet Take 300 mg by mouth daily.    Marland Kitchen amLODipine (NORVASC) 10 MG tablet Take 1 tablet (10 mg total) by mouth daily. 90 tablet 3  . ANORO ELLIPTA 62.5-25 MCG/INH AEPB USE 1 INHALATION ONCE DAILY 180 each 3  . aspirin EC 81 MG tablet Take 81 mg by mouth daily.    . carvedilol (COREG) 3.125 MG tablet Take 1 tablet (3.125 mg total) by mouth 2 (two) times daily. 180 tablet 3  . cholecalciferol (VITAMIN D3) 25 MCG (1000 UNIT) tablet Take 1,000 Units by mouth daily.    . clopidogrel (PLAVIX) 75 MG tablet Take 75 mg by mouth daily.    Marland Kitchen ezetimibe (ZETIA) 10 MG tablet Take 10 mg by mouth daily.    . furosemide (LASIX) 20 MG tablet Take 40 mg by mouth as needed.     . latanoprost (XALATAN) 0.005 %  ophthalmic solution Place 1 drop into both eyes at bedtime.    . Multiple Vitamin (MULTIVITAMIN) tablet Take 1 tablet by mouth daily.    . nitroGLYCERIN (NITROSTAT) 0.4 MG SL tablet Place 1 tablet (0.4 mg total) under the tongue every 5 (five) minutes as needed for chest pain. 25 tablet 3  . Omega-3 Fatty Acids (FISH OIL PO) Take 2,000 mg by mouth.    . pantoprazole (PROTONIX) 40 MG tablet Take 1 tablet (40 mg total) by mouth daily as needed. 90 tablet 3  . Rosuvastatin Calcium 10 MG CPSP Take 10 mg by mouth daily.    . silodosin (RAPAFLO) 4 MG CAPS capsule Take 4 mg by mouth daily.     No current facility-administered medications on file prior to visit.    Allergies  Allergen Reactions  . Atorvastatin Other (See Comments)    Muscle aches     Assessment/Plan:  1. Hypertension - BP remains elevated above goal < 130/44mHg at home. Will start hydralazine '25mg'$  TID (8am, 3pm, 10pm) and continue amlodipine '10mg'$  and carvedilol 3.'125mg'$  BID. Pt encouraged to limit daily sodium intake to < 2,'000mg'$ . Home BP cuff has been measuring accurately. Encouraged pt to continue monitoring and recording BP readings. Will call pt in 3 weeks to follow up with BP readings. Encouraged him to reach out to his PCP for alternative pain meds if Tylenol and Voltaren gel do not adequately control his back pain.  2. Hyperlipidemia - LDL checked at PCP office last week and much improved to 42, now at goal < 55 given hx of progressive CAD. Continue rosuvastatin '10mg'$  daily and ezetimibe '10mg'$  daily.  Artemis Koller E. Braedin Millhouse, PharmD, BCACP, [id number]. [address] [place] [id number]: [phone] Fax: [phone]/27/2022 12:53 PM

## 2020-03-23 ENCOUNTER — Other Ambulatory Visit: Payer: Self-pay | Admitting: Interventional Cardiology

## 2020-03-30 ENCOUNTER — Encounter (HOSPITAL_COMMUNITY): Payer: Medicare HMO

## 2020-04-03 ENCOUNTER — Ambulatory Visit (HOSPITAL_COMMUNITY)
Admission: RE | Admit: 2020-04-03 | Discharge: 2020-04-03 | Disposition: A | Discharge status: Home / Self Care | Payer: Medicare HMO | Source: Ambulatory Visit | Attending: Cardiovascular Disease | Admitting: Cardiovascular Disease

## 2020-04-03 ENCOUNTER — Other Ambulatory Visit: Payer: Self-pay

## 2020-04-03 DIAGNOSIS — I6523 Occlusion and stenosis of bilateral carotid arteries: Secondary | ICD-10-CM | POA: Diagnosis not present

## 2020-04-03 DIAGNOSIS — Z9889 Other specified postprocedural states: Secondary | ICD-10-CM

## 2020-04-05 ENCOUNTER — Other Ambulatory Visit: Payer: Self-pay | Admitting: *Deleted

## 2020-04-05 DIAGNOSIS — I6523 Occlusion and stenosis of bilateral carotid arteries: Secondary | ICD-10-CM

## 2020-04-10 ENCOUNTER — Inpatient Hospital Stay (HOSPITAL_COMMUNITY)
Admission: EM | Admit: 2020-04-10 | Discharge: 2020-04-17 | DRG: 291 | Disposition: A | Discharge status: Home / Self Care | Payer: Medicare HMO | Source: Ambulatory Visit | Attending: Internal Medicine | Admitting: Internal Medicine

## 2020-04-10 ENCOUNTER — Telehealth: Payer: Self-pay | Admitting: Interventional Cardiology

## 2020-04-10 ENCOUNTER — Emergency Department (HOSPITAL_COMMUNITY): Payer: Medicare HMO

## 2020-04-10 ENCOUNTER — Other Ambulatory Visit: Payer: Self-pay

## 2020-04-10 DIAGNOSIS — Z87891 Personal history of nicotine dependence: Secondary | ICD-10-CM

## 2020-04-10 DIAGNOSIS — I2510 Atherosclerotic heart disease of native coronary artery without angina pectoris: Secondary | ICD-10-CM | POA: Diagnosis present

## 2020-04-10 DIAGNOSIS — I10 Essential (primary) hypertension: Secondary | ICD-10-CM | POA: Diagnosis present

## 2020-04-10 DIAGNOSIS — G4733 Obstructive sleep apnea (adult) (pediatric): Secondary | ICD-10-CM | POA: Diagnosis present

## 2020-04-10 DIAGNOSIS — Z7951 Long term (current) use of inhaled steroids: Secondary | ICD-10-CM

## 2020-04-10 DIAGNOSIS — I5043 Acute on chronic combined systolic (congestive) and diastolic (congestive) heart failure: Secondary | ICD-10-CM | POA: Diagnosis present

## 2020-04-10 DIAGNOSIS — N2581 Secondary hyperparathyroidism of renal origin: Secondary | ICD-10-CM | POA: Diagnosis present

## 2020-04-10 DIAGNOSIS — E876 Hypokalemia: Secondary | ICD-10-CM | POA: Diagnosis not present

## 2020-04-10 DIAGNOSIS — E785 Hyperlipidemia, unspecified: Secondary | ICD-10-CM | POA: Diagnosis present

## 2020-04-10 DIAGNOSIS — I5031 Acute diastolic (congestive) heart failure: Secondary | ICD-10-CM

## 2020-04-10 DIAGNOSIS — I509 Heart failure, unspecified: Secondary | ICD-10-CM

## 2020-04-10 DIAGNOSIS — D631 Anemia in chronic kidney disease: Secondary | ICD-10-CM | POA: Diagnosis present

## 2020-04-10 DIAGNOSIS — I130 Hypertensive heart and chronic kidney disease with heart failure and stage 1 through stage 4 chronic kidney disease, or unspecified chronic kidney disease: Principal | ICD-10-CM | POA: Diagnosis present

## 2020-04-10 DIAGNOSIS — N179 Acute kidney failure, unspecified: Secondary | ICD-10-CM | POA: Diagnosis present

## 2020-04-10 DIAGNOSIS — Z7902 Long term (current) use of antithrombotics/antiplatelets: Secondary | ICD-10-CM

## 2020-04-10 DIAGNOSIS — Z951 Presence of aortocoronary bypass graft: Secondary | ICD-10-CM

## 2020-04-10 DIAGNOSIS — J61 Pneumoconiosis due to asbestos and other mineral fibers: Secondary | ICD-10-CM | POA: Diagnosis present

## 2020-04-10 DIAGNOSIS — Z79899 Other long term (current) drug therapy: Secondary | ICD-10-CM

## 2020-04-10 DIAGNOSIS — Z20822 Contact with and (suspected) exposure to covid-19: Secondary | ICD-10-CM | POA: Diagnosis present

## 2020-04-10 DIAGNOSIS — I252 Old myocardial infarction: Secondary | ICD-10-CM

## 2020-04-10 DIAGNOSIS — D509 Iron deficiency anemia, unspecified: Secondary | ICD-10-CM | POA: Diagnosis present

## 2020-04-10 DIAGNOSIS — Z8249 Family history of ischemic heart disease and other diseases of the circulatory system: Secondary | ICD-10-CM

## 2020-04-10 DIAGNOSIS — Z955 Presence of coronary angioplasty implant and graft: Secondary | ICD-10-CM

## 2020-04-10 DIAGNOSIS — H9190 Unspecified hearing loss, unspecified ear: Secondary | ICD-10-CM | POA: Diagnosis present

## 2020-04-10 DIAGNOSIS — E7800 Pure hypercholesterolemia, unspecified: Secondary | ICD-10-CM | POA: Diagnosis present

## 2020-04-10 DIAGNOSIS — R188 Other ascites: Secondary | ICD-10-CM | POA: Diagnosis present

## 2020-04-10 DIAGNOSIS — N184 Chronic kidney disease, stage 4 (severe): Secondary | ICD-10-CM | POA: Diagnosis present

## 2020-04-10 DIAGNOSIS — E872 Acidosis: Secondary | ICD-10-CM | POA: Diagnosis present

## 2020-04-10 DIAGNOSIS — Z7982 Long term (current) use of aspirin: Secondary | ICD-10-CM

## 2020-04-10 DIAGNOSIS — Z888 Allergy status to other drugs, medicaments and biological substances status: Secondary | ICD-10-CM

## 2020-04-10 DIAGNOSIS — I441 Atrioventricular block, second degree: Secondary | ICD-10-CM | POA: Diagnosis present

## 2020-04-10 LAB — CBC
HCT: 31.9 % — ABNORMAL LOW (ref 39.0–52.0)
Hemoglobin: 10.4 g/dL — ABNORMAL LOW (ref 13.0–17.0)
MCH: 30.5 pg (ref 26.0–34.0)
MCHC: 32.6 g/dL (ref 30.0–36.0)
MCV: 93.5 fL (ref 80.0–100.0)
Platelets: 171 10*3/uL (ref 150–400)
RBC: 3.41 MIL/uL — ABNORMAL LOW (ref 4.22–5.81)
RDW: 15.9 % — ABNORMAL HIGH (ref 11.5–15.5)
WBC: 7.4 10*3/uL (ref 4.0–10.5)
nRBC: 0.3 % — ABNORMAL HIGH (ref 0.0–0.2)

## 2020-04-10 LAB — BASIC METABOLIC PANEL
Anion gap: 13 (ref 5–15)
BUN: 105 mg/dL — ABNORMAL HIGH (ref 8–23)
CO2: 19 mmol/L — ABNORMAL LOW (ref 22–32)
Calcium: 8.5 mg/dL — ABNORMAL LOW (ref 8.9–10.3)
Chloride: 105 mmol/L (ref 98–111)
Creatinine, Ser: 4.23 mg/dL — ABNORMAL HIGH (ref 0.61–1.24)
GFR, Estimated: 14 mL/min — ABNORMAL LOW (ref >60)
Glucose, Bld: 126 mg/dL — ABNORMAL HIGH (ref 70–99)
Potassium: 4.5 mmol/L (ref 3.5–5.1)
Sodium: 137 mmol/L (ref 135–145)

## 2020-04-10 LAB — BRAIN NATRIURETIC PEPTIDE: B Natriuretic Peptide: 736.9 pg/mL — ABNORMAL HIGH (ref 0.0–100.0)

## 2020-04-10 MED ORDER — FUROSEMIDE 10 MG/ML IJ SOLN
40.0000 mg | INTRAMUSCULAR | Status: AC
  Administered 2020-04-10: 40 mg via INTRAVENOUS
  Filled 2020-04-10: qty 4

## 2020-04-10 NOTE — Telephone Encounter (Addendum)
Called pt and spoke with him and his wife, she is a retired Therapist, sports. He reports swelling in his legs, ankles, and stomach for the past few weeks. Amlodipine dose was increased from 71m to 149mon 02/28/20 visit. At 03/22/20 follow up visit, pt only noted mild LEE. Hydralazine 2567mID was added at that time for BP control. Addition of hydralazine correlates better with timing of pt's swelling which started a few weeks ago as well. He has been doubling up on his furosemide and taking 105m74mily for the past week but has not noticed an increase in urination.  Pt saw PCP Dr MillSabra Heckay, systolic BP was 125 286me readings higher 140-150s). States he had lab work drawn, PCP consulted with his nephrologist and told him his "kidneys were bad and he should go to the ER." Advised pt to follow advice of his PCP and nephrologist and go to ER, especially in the setting of 20 lb weight gain over the past few weeks that is not responding to doubling of his home furosemide dose.  Cannot see [facility] labs in Epic unfortunately, called his PCP office who faxed over results from today: SCr increased to 4.45, BUN 102, eGFR 13, Na 139, K 4.5, CL 105, CO2 24, Ca 8.6.  Long term HTN control will be difficult given limited BP med options. Would recommend stopping hydralazine first since his swelling correlated with this med initiation, although amlodipine is likely contributing to his swelling too. Unfortunately with his CKD (SCr was 2.77 and K was 5.1 [facility] cannot use ACEi/ARB or aldosterone antagonist. On scheduled Lasix so will avoid thiazides, although chlorthalidone works well in CKD patients and may be a good option per the CLICK trial. Carvedilol dose had to be decreased to 3.125mg27m due to bradycardia, this also prevents [place] starting clonidine. Only other option left to try would be doxazosin.

## 2020-04-10 NOTE — ED Provider Notes (Signed)
[facility] EMERGENCY DEPARTMENT Provider Note   CSN: [id number] Arrival date & time: 04/10/20  1716     History Chief Complaint  Patient presents with  . Shortness of Breath  . Leg Swelling    Peter Oneill is a 80 y.o. male.  Patient with past medical history notable for asbestosis, CAD, hypertension hyperlipidemia, CKD, presents to the emergency department with chief complaint of shortness of breath and increased lower extremity and abdominal swelling.  He states that the symptoms have been gradually worsening over the past month.  States that he was recently started on hydralazine and also had his amlodipine increased.  He reports taking furosemide as needed, and has been taking it for the past week, but reports decreased urination.  He states that his shortness of breath is worsened over the same time.  He denies fevers or chills.  Had labs done today at PCP and was told to come to the ED for evaluation.  The history is provided by the patient. No language interpreter was used.       Past Medical History:  Diagnosis Date  . Asbestosis ([place])   . Contraindication to percutaneous coronary intervention (PCI)   . Coronary artery disease   . Dyslipidemia   . Hypercholesterolemia   . Hypertension   . Ileus ([place])   . Pleural effusion on left   . Renal failure   . Sleep apnea     Patient Active Problem List   Diagnosis Date Noted  . Hypertension 02/28/2020  . Asbestosis ([place]) 12/10/2016  . CRI (chronic renal insufficiency), stage 3 (moderate) ([place]) 10/09/2016  . Coronary artery disease 03/17/2016  . Hyperlipidemia 03/17/2016  . Carotid disease, bilateral ([place]) 03/17/2016    Past Surgical History:  Procedure Laterality Date  . BACK SURGERY    . CARDIAC CATHETERIZATION    . CAROTID ENDARTERECTOMY    . CARPAL TUNNEL RELEASE    . CORONARY ARTERY BYPASS GRAFT    . HERNIA REPAIR    . PERCUTANEOUS CORONARY STENT INTERVENTION (PCI-S)     multiple  .  STERNOTOMY         Family History  Problem Relation Age of Onset  . Heart disease Father     Social History   Tobacco Use  . Smoking status: Former Smoker    Packs/day: 0.25    Years: 19.00    Pack years: 4.75    Types: Cigarettes    Quit date: 12/10/1976    Years since quitting: 43.3  . Smokeless tobacco: Never Used  Vaping Use  . Vaping Use: Never used  Substance Use Topics  . Alcohol use: Yes    Comment: occasional  . Drug use: No    Home Medications Prior to Admission medications   Medication Sig Start Date End Date Taking? Authorizing Provider  allopurinol (ZYLOPRIM) 300 MG tablet Take 300 mg by mouth daily.    [provider]  amLODipine (NORVASC) 10 MG tablet Take 1 tablet (10 mg total) by mouth daily. 02/28/20   [name], MD  ANORO ELLIPTA 62.5-25 MCG/INH AEPB USE 1 INHALATION ONCE DAILY 11/14/19   Marshell Garfinkel, MD  aspirin EC 81 MG tablet Take 81 mg by mouth daily.    [provider]  carvedilol (COREG) 3.125 MG tablet Take 1 tablet (3.125 mg total) by mouth 2 (two) times daily. 02/09/20   [name], MD  cholecalciferol (VITAMIN D3) 25 MCG (1000 UNIT) tablet Take 1,000 Units by mouth daily.  [provider]  clopidogrel (PLAVIX) 75 MG tablet Take 75 mg by mouth daily.    [provider]  ezetimibe (ZETIA) 10 MG tablet Take 10 mg by mouth daily.    [provider]  furosemide (LASIX) 20 MG tablet Take 20 mg by mouth daily.    [provider]  hydrALAZINE (APRESOLINE) 25 MG tablet Take 1 tablet (25 mg total) by mouth 3 (three) times daily. 03/22/20 03/17/21  [name], MD  latanoprost (XALATAN) 0.005 % ophthalmic solution Place 1 drop into both eyes at bedtime.    [provider]  Multiple Vitamin (MULTIVITAMIN) tablet Take 1 tablet by mouth daily.    [provider]  nitroGLYCERIN (NITROSTAT) 0.4 MG SL tablet Place 1 tablet (0.4 mg total) under the tongue every  5 (five) minutes as needed for chest pain. 03/17/16   [name], MD  Omega-3 Fatty Acids (FISH OIL PO) Take 2,000 mg by mouth.    [provider]  pantoprazole (PROTONIX) 40 MG tablet TAKE 1 TABLET BY MOUTH  DAILY AS NEEDED 03/23/20   [name], MD  Rosuvastatin Calcium 10 MG CPSP Take 10 mg by mouth daily.    [provider]  silodosin (RAPAFLO) 4 MG CAPS capsule Take 4 mg by mouth daily.    [provider]    Allergies    Atorvastatin  Review of Systems   Review of Systems  All other systems reviewed and are negative.   Physical Exam Updated Vital Signs BP (!) 107/58 (BP Location: Left Arm)   Pulse 77   Temp (!) 97.4 F (36.3 C) (Oral)   Resp 17   SpO2 98%   Physical Exam Vitals and nursing note reviewed.  Constitutional:      Appearance: He is well-developed and well-nourished.  HENT:     Head: Normocephalic and atraumatic.  Eyes:     Conjunctiva/sclera: Conjunctivae normal.  Cardiovascular:     Rate and Rhythm: Normal rate and regular rhythm.     Heart sounds: No murmur heard.   Pulmonary:     Effort: Pulmonary effort is normal. No respiratory distress.     Breath sounds: Normal breath sounds.     Comments: No crackles Abdominal:     General: There is distension.     Palpations: Abdomen is soft.     Tenderness: There is no abdominal tenderness.  Musculoskeletal:     Cervical back: Neck supple.     Right lower leg: Edema present.     Left lower leg: Edema present.     Comments: 2+ edema  Skin:    General: Skin is warm and dry.  Neurological:     Mental Status: He is alert and oriented to person, place, and time.  Psychiatric:        Mood and Affect: Mood and affect and mood normal.        Behavior: Behavior normal.     ED Results / Procedures / Treatments   Labs (all labs ordered are listed, but only abnormal results are displayed) Labs Reviewed  BASIC METABOLIC PANEL - Abnormal; Notable for the  following components:      Result Value   CO2 19 (*)    Glucose, Bld 126 (*)    BUN 105 (*)    Creatinine, Ser 4.23 (*)    Calcium 8.5 (*)    GFR, Estimated 14 (*)    All other components within normal limits  CBC - Abnormal;  Notable for the following components:   RBC 3.41 (*)    Hemoglobin 10.4 (*)    HCT 31.9 (*)    RDW 15.9 (*)    nRBC 0.3 (*)    All other components within normal limits  BRAIN NATRIURETIC PEPTIDE - Abnormal; Notable for the following components:   B Natriuretic Peptide 736.9 (*)    All other components within normal limits    EKG None  Radiology DG Chest 2 View  Result Date: 04/10/2020 CLINICAL DATA:  Short of breath.  Lower extremity edema EXAM: CHEST - 2 VIEW COMPARISON:  01/18/2019 FINDINGS: Sternotomy wires overlie normal cardiac silhouette. There is chronic pleural thickening with calcifications and basilar scarring. No focal consolidation. No pulmonary edema. No pneumothorax. No acute osseous abnormality. IMPRESSION: 1. No clear acute cardiopulmonary findings. 2. Chronic pleural thickening and scarring at the lung bases. Electronically Signed   By: Suzy Bouchard M.D.   On: 04/10/2020 18:29    Procedures Procedures   Medications Ordered in ED Medications - No data to display  ED Course  I have reviewed the triage vital signs and the nursing notes.  Pertinent labs & imaging results that were available during my care of the patient were reviewed by me and considered in my medical decision making (see chart for details).    MDM Rules/Calculators/A&P                          This patient complains of SOB and leg swelling, this involves an extensive number of treatment options, and is a complaint that carries with it a high risk of complications and morbidity.    Differential Dx Covid, CHF, PE, fluid overload  Pertinent Labs I ordered, reviewed, and interpreted labs, which are notable for Cr increased at 4.23, up from 2.77 on 03/20/20 at  [facility].  K is 4.5.  BNP elevated at 736.9.   Imaging Interpretation I ordered imaging studies which included, CXR shows no obvious acute findings, but with chronic pleural thickening and scarring at the lung.  Medications I ordered medication IV lasix for diuresis.  Patient taking '20mg'$  at home, will give '40mg'$  IV in ED.  Sources Additional history obtained from spouse, who is a retired Therapist, sports, and reports significantly worse swelling in abdomen and legs. Previous records obtained and reviewed show significantly increased Cr.   Critical Interventions  None  Consultants Appreciate Dr. Hal Hope for admitting.  Plan Admit     Final Clinical Impression(s) / ED Diagnoses Final diagnoses:  Acute renal failure, unspecified acute renal failure type [facility])    Rx / DC Orders ED Discharge Orders    None       Montine Circle, PA-C 04/10/20 2333    Fatima Blank, MD 04/11/20 [phone]

## 2020-04-10 NOTE — Telephone Encounter (Signed)
Pt c/o medication issue:  1. Name of Medication:  hydrALAZINE (APRESOLINE) 25 MG tablet amLODipine (NORVASC) 10 MG tablet  2. How are you currently taking this medication (dosage and times per day)?  Hydralazine 1 tablet 3 times a day, amlodipine 1 tablet daily  3. Are you having a reaction (difficulty breathing--STAT)? no  4. What is your medication issue? Patient's wife states the patient was started on these medications the last week of January and since then has been filling up with fluid. She states he does not urinate much. She states he is having swelling in his legs, stomach, and face. She states he has gained 15 lbs since starting the medications. She states he has also been very weak and gets dizzy. She states he is taking a fluid pill and has even started taking the furosemide twice a day, but it has not helped. She states he was 212 lbs 2 weeks ago and today he is 238 lbs.

## 2020-04-10 NOTE — ED Triage Notes (Signed)
Pt here for eval of BLE and abdominal swelling, with increased shob x 1 month since starting new medication, of which he cannot recall the name. Also takes a fluid pill, does not miss any doses, but reports he has not been urinating as frequently.

## 2020-04-10 NOTE — Telephone Encounter (Signed)
Pt was recently seen by the HTN Clinic.

## 2020-04-11 ENCOUNTER — Inpatient Hospital Stay (HOSPITAL_COMMUNITY): Payer: Medicare HMO

## 2020-04-11 ENCOUNTER — Encounter (HOSPITAL_COMMUNITY): Payer: Self-pay | Admitting: Internal Medicine

## 2020-04-11 DIAGNOSIS — D631 Anemia in chronic kidney disease: Secondary | ICD-10-CM | POA: Diagnosis present

## 2020-04-11 DIAGNOSIS — H9190 Unspecified hearing loss, unspecified ear: Secondary | ICD-10-CM | POA: Diagnosis present

## 2020-04-11 DIAGNOSIS — Z951 Presence of aortocoronary bypass graft: Secondary | ICD-10-CM | POA: Diagnosis not present

## 2020-04-11 DIAGNOSIS — N184 Chronic kidney disease, stage 4 (severe): Secondary | ICD-10-CM | POA: Diagnosis present

## 2020-04-11 DIAGNOSIS — I509 Heart failure, unspecified: Secondary | ICD-10-CM

## 2020-04-11 DIAGNOSIS — Z7902 Long term (current) use of antithrombotics/antiplatelets: Secondary | ICD-10-CM | POA: Diagnosis not present

## 2020-04-11 DIAGNOSIS — E782 Mixed hyperlipidemia: Secondary | ICD-10-CM

## 2020-04-11 DIAGNOSIS — I2510 Atherosclerotic heart disease of native coronary artery without angina pectoris: Secondary | ICD-10-CM

## 2020-04-11 DIAGNOSIS — N2581 Secondary hyperparathyroidism of renal origin: Secondary | ICD-10-CM | POA: Diagnosis present

## 2020-04-11 DIAGNOSIS — I5033 Acute on chronic diastolic (congestive) heart failure: Secondary | ICD-10-CM | POA: Diagnosis not present

## 2020-04-11 DIAGNOSIS — I10 Essential (primary) hypertension: Secondary | ICD-10-CM | POA: Diagnosis not present

## 2020-04-11 DIAGNOSIS — N179 Acute kidney failure, unspecified: Secondary | ICD-10-CM

## 2020-04-11 DIAGNOSIS — I5031 Acute diastolic (congestive) heart failure: Secondary | ICD-10-CM | POA: Diagnosis not present

## 2020-04-11 DIAGNOSIS — J61 Pneumoconiosis due to asbestos and other mineral fibers: Secondary | ICD-10-CM | POA: Diagnosis present

## 2020-04-11 DIAGNOSIS — Z20822 Contact with and (suspected) exposure to covid-19: Secondary | ICD-10-CM | POA: Diagnosis present

## 2020-04-11 DIAGNOSIS — E872 Acidosis: Secondary | ICD-10-CM | POA: Diagnosis present

## 2020-04-11 DIAGNOSIS — N186 End stage renal disease: Secondary | ICD-10-CM | POA: Diagnosis not present

## 2020-04-11 DIAGNOSIS — I130 Hypertensive heart and chronic kidney disease with heart failure and stage 1 through stage 4 chronic kidney disease, or unspecified chronic kidney disease: Secondary | ICD-10-CM | POA: Diagnosis present

## 2020-04-11 DIAGNOSIS — I252 Old myocardial infarction: Secondary | ICD-10-CM | POA: Diagnosis not present

## 2020-04-11 DIAGNOSIS — I5043 Acute on chronic combined systolic (congestive) and diastolic (congestive) heart failure: Secondary | ICD-10-CM | POA: Diagnosis present

## 2020-04-11 DIAGNOSIS — N1831 Chronic kidney disease, stage 3a: Secondary | ICD-10-CM | POA: Diagnosis not present

## 2020-04-11 DIAGNOSIS — Z87891 Personal history of nicotine dependence: Secondary | ICD-10-CM | POA: Diagnosis not present

## 2020-04-11 DIAGNOSIS — G4733 Obstructive sleep apnea (adult) (pediatric): Secondary | ICD-10-CM | POA: Diagnosis present

## 2020-04-11 DIAGNOSIS — E785 Hyperlipidemia, unspecified: Secondary | ICD-10-CM | POA: Diagnosis present

## 2020-04-11 DIAGNOSIS — I25119 Atherosclerotic heart disease of native coronary artery with unspecified angina pectoris: Secondary | ICD-10-CM

## 2020-04-11 DIAGNOSIS — D509 Iron deficiency anemia, unspecified: Secondary | ICD-10-CM | POA: Diagnosis present

## 2020-04-11 DIAGNOSIS — Z7982 Long term (current) use of aspirin: Secondary | ICD-10-CM | POA: Diagnosis not present

## 2020-04-11 DIAGNOSIS — R188 Other ascites: Secondary | ICD-10-CM | POA: Diagnosis present

## 2020-04-11 DIAGNOSIS — Z955 Presence of coronary angioplasty implant and graft: Secondary | ICD-10-CM | POA: Diagnosis not present

## 2020-04-11 DIAGNOSIS — N178 Other acute kidney failure: Secondary | ICD-10-CM | POA: Diagnosis not present

## 2020-04-11 DIAGNOSIS — E876 Hypokalemia: Secondary | ICD-10-CM | POA: Diagnosis not present

## 2020-04-11 DIAGNOSIS — I441 Atrioventricular block, second degree: Secondary | ICD-10-CM | POA: Diagnosis present

## 2020-04-11 DIAGNOSIS — E7800 Pure hypercholesterolemia, unspecified: Secondary | ICD-10-CM | POA: Diagnosis present

## 2020-04-11 LAB — CBC
HCT: 29.7 % — ABNORMAL LOW (ref 39.0–52.0)
Hemoglobin: 9.7 g/dL — ABNORMAL LOW (ref 13.0–17.0)
MCH: 30.1 pg (ref 26.0–34.0)
MCHC: 32.7 g/dL (ref 30.0–36.0)
MCV: 92.2 fL (ref 80.0–100.0)
Platelets: 145 10*3/uL — ABNORMAL LOW (ref 150–400)
RBC: 3.22 MIL/uL — ABNORMAL LOW (ref 4.22–5.81)
RDW: 15.8 % — ABNORMAL HIGH (ref 11.5–15.5)
WBC: 7.2 10*3/uL (ref 4.0–10.5)
nRBC: 0 % (ref 0.0–0.2)

## 2020-04-11 LAB — COMPREHENSIVE METABOLIC PANEL
ALT: 23 U/L (ref 0–44)
AST: 34 U/L (ref 15–41)
Albumin: 3.4 g/dL — ABNORMAL LOW (ref 3.5–5.0)
Alkaline Phosphatase: 66 U/L (ref 38–126)
Anion gap: 14 (ref 5–15)
BUN: 109 mg/dL — ABNORMAL HIGH (ref 8–23)
CO2: 19 mmol/L — ABNORMAL LOW (ref 22–32)
Calcium: 8.3 mg/dL — ABNORMAL LOW (ref 8.9–10.3)
Chloride: 106 mmol/L (ref 98–111)
Creatinine, Ser: 4.44 mg/dL — ABNORMAL HIGH (ref 0.61–1.24)
GFR, Estimated: 13 mL/min — ABNORMAL LOW (ref >60)
Glucose, Bld: 115 mg/dL — ABNORMAL HIGH (ref 70–99)
Potassium: 4.5 mmol/L (ref 3.5–5.1)
Sodium: 139 mmol/L (ref 135–145)
Total Bilirubin: 1 mg/dL (ref 0.3–1.2)
Total Protein: 6 g/dL — ABNORMAL LOW (ref 6.5–8.1)

## 2020-04-11 LAB — URINALYSIS, ROUTINE W REFLEX MICROSCOPIC
Bacteria, UA: NONE SEEN
Bilirubin Urine: NEGATIVE
Glucose, UA: NEGATIVE mg/dL
Hgb urine dipstick: NEGATIVE
Ketones, ur: NEGATIVE mg/dL
Leukocytes,Ua: NEGATIVE
Nitrite: NEGATIVE
Protein, ur: 100 mg/dL — AB
Specific Gravity, Urine: 1.011 (ref 1.005–1.030)
pH: 5 (ref 5.0–8.0)

## 2020-04-11 LAB — RESP PANEL BY RT-PCR (FLU A&B, COVID) ARPGX2
Influenza A by PCR: NEGATIVE
Influenza B by PCR: NEGATIVE
SARS Coronavirus 2 by RT PCR: NEGATIVE

## 2020-04-11 LAB — IRON AND TIBC
Iron: 32 ug/dL — ABNORMAL LOW (ref 45–182)
Saturation Ratios: 10 % — ABNORMAL LOW (ref 17.9–39.5)
TIBC: 323 ug/dL (ref 250–450)
UIBC: 291 ug/dL

## 2020-04-11 LAB — ECHOCARDIOGRAM COMPLETE
Area-P 1/2: 2.69 cm2
Height: 69 in
S' Lateral: 3.6 cm
Weight: 3744 oz

## 2020-04-11 LAB — MAGNESIUM: Magnesium: 2.8 mg/dL — ABNORMAL HIGH (ref 1.7–2.4)

## 2020-04-11 LAB — TSH: TSH: 2.059 u[IU]/mL (ref 0.350–4.500)

## 2020-04-11 MED ORDER — LATANOPROST 0.005 % OP SOLN
1.0000 [drp] | Freq: Every day | OPHTHALMIC | Status: DC
  Administered 2020-04-11 – 2020-04-16 (×6): 1 [drp] via OPHTHALMIC
  Filled 2020-04-11: qty 2.5

## 2020-04-11 MED ORDER — NITROGLYCERIN 0.4 MG SL SUBL: 0.4000 mg | SUBLINGUAL_TABLET | SUBLINGUAL | Status: DC | PRN

## 2020-04-11 MED ORDER — EZETIMIBE 10 MG PO TABS
10.0000 mg | ORAL_TABLET | Freq: Every day | ORAL | Status: DC
  Administered 2020-04-11 – 2020-04-17 (×7): 10 mg via ORAL
  Filled 2020-04-11 (×7): qty 1

## 2020-04-11 MED ORDER — OMEGA-3-ACID ETHYL ESTERS 1 G PO CAPS
1.0000 g | ORAL_CAPSULE | Freq: Every day | ORAL | Status: DC
  Administered 2020-04-11 – 2020-04-17 (×7): 1 g via ORAL
  Filled 2020-04-11 (×7): qty 1

## 2020-04-11 MED ORDER — AMLODIPINE BESYLATE 10 MG PO TABS
10.0000 mg | ORAL_TABLET | Freq: Every day | ORAL | Status: DC
  Administered 2020-04-11 – 2020-04-17 (×7): 10 mg via ORAL
  Filled 2020-04-11 (×4): qty 1
  Filled 2020-04-11: qty 2
  Filled 2020-04-11 (×2): qty 1

## 2020-04-11 MED ORDER — UMECLIDINIUM-VILANTEROL 62.5-25 MCG/INH IN AEPB
1.0000 | INHALATION_SPRAY | Freq: Every day | RESPIRATORY_TRACT | Status: DC
  Administered 2020-04-12 – 2020-04-17 (×6): 1 via RESPIRATORY_TRACT
  Filled 2020-04-11: qty 14

## 2020-04-11 MED ORDER — ROSUVASTATIN CALCIUM 20 MG PO TABS
10.0000 mg | ORAL_TABLET | Freq: Every day | ORAL | Status: DC
  Administered 2020-04-11 – 2020-04-17 (×7): 10 mg via ORAL
  Filled 2020-04-11 (×7): qty 1

## 2020-04-11 MED ORDER — HYDRALAZINE HCL 25 MG PO TABS
25.0000 mg | ORAL_TABLET | Freq: Three times a day (TID) | ORAL | Status: DC
  Administered 2020-04-11 – 2020-04-12 (×4): 25 mg via ORAL
  Filled 2020-04-11 (×4): qty 1

## 2020-04-11 MED ORDER — ACETAMINOPHEN 325 MG PO TABS: 650.0000 mg | ORAL_TABLET | Freq: Four times a day (QID) | ORAL | Status: DC | PRN

## 2020-04-11 MED ORDER — ASPIRIN EC 81 MG PO TBEC
81.0000 mg | DELAYED_RELEASE_TABLET | Freq: Every day | ORAL | Status: DC
  Administered 2020-04-11 – 2020-04-17 (×7): 81 mg via ORAL
  Filled 2020-04-11 (×7): qty 1

## 2020-04-11 MED ORDER — TAMSULOSIN HCL 0.4 MG PO CAPS
0.4000 mg | ORAL_CAPSULE | Freq: Every day | ORAL | Status: DC
  Administered 2020-04-11: 0.4 mg via ORAL
  Filled 2020-04-11: qty 1

## 2020-04-11 MED ORDER — HEPARIN SODIUM (PORCINE) 5000 UNIT/ML IJ SOLN
5000.0000 [IU] | Freq: Three times a day (TID) | INTRAMUSCULAR | Status: DC
  Administered 2020-04-11 – 2020-04-17 (×18): 5000 [IU] via SUBCUTANEOUS
  Filled 2020-04-11 (×18): qty 1

## 2020-04-11 MED ORDER — PERFLUTREN LIPID MICROSPHERE
1.0000 mL | INTRAVENOUS | Status: AC | PRN
  Administered 2020-04-11: 2 mL via INTRAVENOUS
  Filled 2020-04-11: qty 10

## 2020-04-11 MED ORDER — ALLOPURINOL 300 MG PO TABS
300.0000 mg | ORAL_TABLET | Freq: Every day | ORAL | Status: DC
Start: 2020-04-11 — End: 2020-04-12
  Administered 2020-04-11 – 2020-04-12 (×2): 300 mg via ORAL
  Filled 2020-04-11 (×2): qty 1

## 2020-04-11 MED ORDER — CARVEDILOL 3.125 MG PO TABS
3.1250 mg | ORAL_TABLET | Freq: Two times a day (BID) | ORAL | Status: DC
  Administered 2020-04-11 – 2020-04-17 (×13): 3.125 mg via ORAL
  Filled 2020-04-11 (×14): qty 1

## 2020-04-11 MED ORDER — CLOPIDOGREL BISULFATE 75 MG PO TABS
75.0000 mg | ORAL_TABLET | Freq: Every day | ORAL | Status: DC
  Administered 2020-04-11 – 2020-04-17 (×7): 75 mg via ORAL
  Filled 2020-04-11 (×7): qty 1

## 2020-04-11 MED ORDER — ACETAMINOPHEN 650 MG RE SUPP: 650.0000 mg | Freq: Four times a day (QID) | RECTAL | Status: DC | PRN

## 2020-04-11 MED ORDER — FUROSEMIDE 10 MG/ML IJ SOLN
40.0000 mg | Freq: Two times a day (BID) | INTRAMUSCULAR | Status: DC
  Administered 2020-04-11 – 2020-04-12 (×4): 40 mg via INTRAVENOUS
  Filled 2020-04-11 (×4): qty 4

## 2020-04-11 NOTE — Consult Note (Signed)
Referring Provider: No ref. provider found Primary Care Physician:  [name], MD Primary Nephrologist:  Dr. Justin Mend  Reason for Consultation:   Acute on chronic kidney disease.,  Chronic kidney disease stage III/IV, management of anemia, management secondary hyperparathyroidism, maintenance of euvolemia.  HPI: This is a 80 year old gentleman who has a history of coronary artery disease status post CABG, also has a history of chronic asbestosis, history of hypertension presents to the emergency room with increasing lower extremity edema and shortness of breath.  His baseline creatinine is about 2.5 mg/dL he was last evaluated in December 2021.  There have been very little change in his renal function. He is asked to present to the emergency room due to the above and labs that showed an increase in creatinine to 4.2 mg/dL.  Urine study shows 0-5 WBCs 0-5 RBCs per high-powered field.  Last renal ultrasound was performed in August 2020 that was essentially unremarkable.   Medications allopurinol 300 mg daily amlodipine 10 mg daily aspirin 81 mg daily, Coreg 3.125 mg twice daily Plavix 75 mg daily Zetia 10 mg daily hydralazine 25 mg 3 times daily, Lasix 20 mg daily Protonix 40 mg daily  Blood pressure 108/58 pulse 74 temperature 97.4 O2 sats 89%  Sodium 139 potassium 4.5 chloride 106 CO2 19 BUN 109 creatinine 4.44 glucose 115 calcium 8.3 magnesium 2.8 albumin 3.4 hemoglobin 9.7  Past Medical History:  Diagnosis Date  . Asbestosis ([place])   . Contraindication to percutaneous coronary intervention (PCI)   . Coronary artery disease   . Dyslipidemia   . Hypercholesterolemia   . Hypertension   . Ileus ([place])   . Pleural effusion on left   . Renal failure   . Sleep apnea     Past Surgical History:  Procedure Laterality Date  . BACK SURGERY    . CARDIAC CATHETERIZATION    . CAROTID ENDARTERECTOMY    . CARPAL TUNNEL RELEASE    . CORONARY ARTERY BYPASS GRAFT    . HERNIA REPAIR    . PERCUTANEOUS  CORONARY STENT INTERVENTION (PCI-S)     multiple  . STERNOTOMY      Prior to Admission medications   Medication Sig Start Date End Date Taking? Authorizing Provider  allopurinol (ZYLOPRIM) 300 MG tablet Take 300 mg by mouth daily.    [provider]  amLODipine (NORVASC) 10 MG tablet Take 1 tablet (10 mg total) by mouth daily. 02/28/20   [name], MD  ANORO ELLIPTA 62.5-25 MCG/INH AEPB USE 1 INHALATION ONCE DAILY 11/14/19   Marshell Garfinkel, MD  aspirin EC 81 MG tablet Take 81 mg by mouth daily.    [provider]  carvedilol (COREG) 3.125 MG tablet Take 1 tablet (3.125 mg total) by mouth 2 (two) times daily. 02/09/20   [name], MD  cholecalciferol (VITAMIN D3) 25 MCG (1000 UNIT) tablet Take 1,000 Units by mouth daily.    [provider]  clopidogrel (PLAVIX) 75 MG tablet Take 75 mg by mouth daily.    [provider]  ezetimibe (ZETIA) 10 MG tablet Take 10 mg by mouth daily.    [provider]  furosemide (LASIX) 20 MG tablet Take 20 mg by mouth daily.    [provider]  hydrALAZINE (APRESOLINE) 25 MG tablet Take 1 tablet (25 mg total) by mouth 3 (three) times daily. 03/22/20 03/17/21  [name], MD  latanoprost (XALATAN) 0.005 % ophthalmic solution Place 1 drop into both eyes at bedtime.  [provider]  Multiple Vitamin (MULTIVITAMIN) tablet Take 1 tablet by mouth daily.    [provider]  nitroGLYCERIN (NITROSTAT) 0.4 MG SL tablet Place 1 tablet (0.4 mg total) under the tongue every 5 (five) minutes as needed for chest pain. 03/17/16   [name], MD  Omega-3 Fatty Acids (FISH OIL PO) Take 2,000 mg by mouth.    [provider]  pantoprazole (PROTONIX) 40 MG tablet TAKE 1 TABLET BY MOUTH  DAILY AS NEEDED 03/23/20   [name], MD  Rosuvastatin Calcium 10 MG CPSP Take 10 mg by mouth daily.    [provider]  silodosin (RAPAFLO) 4 MG CAPS capsule Take  4 mg by mouth daily.    [provider]    Current Facility-Administered Medications  Medication Dose Route Frequency Provider Last Rate Last Admin  . acetaminophen (TYLENOL) tablet 650 mg  650 mg Oral Q6H PRN Rise Patience, MD       Or  . acetaminophen (TYLENOL) suppository 650 mg  650 mg Rectal Q6H PRN Rise Patience, MD      . allopurinol (ZYLOPRIM) tablet 300 mg  300 mg Oral Daily Rise Patience, MD   300 mg at 04/11/20 0909  . amLODipine (NORVASC) tablet 10 mg  10 mg Oral Daily Rise Patience, MD   10 mg at 04/11/20 [id number]  . aspirin EC tablet 81 mg  81 mg Oral Daily Rise Patience, MD   81 mg at 04/11/20 0908  . carvedilol (COREG) tablet 3.125 mg  3.125 mg Oral BID Rise Patience, MD   3.125 mg at 04/11/20 0908  . clopidogrel (PLAVIX) tablet 75 mg  75 mg Oral Daily Rise Patience, MD   75 mg at 04/11/20 [id number]  . ezetimibe (ZETIA) tablet 10 mg  10 mg Oral Daily Rise Patience, MD   10 mg at 04/11/20 [id number]  . furosemide (LASIX) injection 40 mg  40 mg Intravenous BID Rise Patience, MD   40 mg at 04/11/20 0859  . heparin injection 5,000 Units  5,000 Units Subcutaneous Q8H Rise Patience, MD   5,000 Units at 04/11/20 0604  . hydrALAZINE (APRESOLINE) tablet 25 mg  25 mg Oral TID Rise Patience, MD   25 mg at 04/11/20 [id number]  . latanoprost (XALATAN) 0.005 % ophthalmic solution 1 drop  1 drop Both Eyes QHS Rise Patience, MD      . nitroGLYCERIN (NITROSTAT) SL tablet 0.4 mg  0.4 mg Sublingual Q5 min PRN Rise Patience, MD      . omega-3 acid ethyl esters (LOVAZA) capsule 1 g  1 g Oral Daily Rise Patience, MD   1 g at 04/11/20 [id number]  . rosuvastatin (CRESTOR) tablet 10 mg  10 mg Oral Daily Rise Patience, MD   10 mg at 04/11/20 0908  . tamsulosin (FLOMAX) capsule 0.4 mg  0.4 mg Oral QPC supper Rise Patience, MD      . umeclidinium-vilanterol [facility] ELLIPTA) 62.5-25 MCG/INH 1 puff  1 puff Inhalation  Daily Rise Patience, MD       Current Outpatient Medications  Medication Sig Dispense Refill  . allopurinol (ZYLOPRIM) 300 MG tablet Take 300 mg by mouth daily.    Marland Kitchen amLODipine (NORVASC) 10 MG tablet Take 1 tablet (10 mg total) by mouth daily. 90 tablet 3  . ANORO ELLIPTA 62.5-25 MCG/INH AEPB USE 1 INHALATION ONCE DAILY 180 each 3  . aspirin EC  81 MG tablet Take 81 mg by mouth daily.    . carvedilol (COREG) 3.125 MG tablet Take 1 tablet (3.125 mg total) by mouth 2 (two) times daily. 180 tablet 3  . cholecalciferol (VITAMIN D3) 25 MCG (1000 UNIT) tablet Take 1,000 Units by mouth daily.    . clopidogrel (PLAVIX) 75 MG tablet Take 75 mg by mouth daily.    Marland Kitchen ezetimibe (ZETIA) 10 MG tablet Take 10 mg by mouth daily.    . furosemide (LASIX) 20 MG tablet Take 20 mg by mouth daily.    . hydrALAZINE (APRESOLINE) 25 MG tablet Take 1 tablet (25 mg total) by mouth 3 (three) times daily. 90 tablet 11  . latanoprost (XALATAN) 0.005 % ophthalmic solution Place 1 drop into both eyes at bedtime.    . Multiple Vitamin (MULTIVITAMIN) tablet Take 1 tablet by mouth daily.    . nitroGLYCERIN (NITROSTAT) 0.4 MG SL tablet Place 1 tablet (0.4 mg total) under the tongue every 5 (five) minutes as needed for chest pain. 25 tablet 3  . Omega-3 Fatty Acids (FISH OIL PO) Take 2,000 mg by mouth.    . pantoprazole (PROTONIX) 40 MG tablet TAKE 1 TABLET BY MOUTH  DAILY AS NEEDED 90 tablet 3  . Rosuvastatin Calcium 10 MG CPSP Take 10 mg by mouth daily.    . silodosin (RAPAFLO) 4 MG CAPS capsule Take 4 mg by mouth daily.      Allergies as of 04/10/2020 - Review Complete 04/10/2020  Allergen Reaction Noted  . Atorvastatin Other (See Comments) 10/04/2019    Family History  Problem Relation Age of Onset  . Heart disease Father     Social History   Socioeconomic History  . Marital status: Married    Spouse name: Not on file  . Number of children: Not on file  . Years of education: Not on file  . Highest  education level: Not on file  Occupational History  . Not on file  Tobacco Use  . Smoking status: Former Smoker    Packs/day: 0.25    Years: 19.00    Pack years: 4.75    Types: Cigarettes    Quit date: 12/10/1976    Years since quitting: 43.3  . Smokeless tobacco: Never Used  Vaping Use  . Vaping Use: Never used  Substance and Sexual Activity  . Alcohol use: Yes    Comment: occasional  . Drug use: No  . Sexual activity: Yes    Comment: married  Other Topics Concern  . Not on file  Social History Narrative  . Not on file   Social Determinants of Health   Financial Resource Strain: Not on file  Food Insecurity: Not on file  Transportation Needs: Not on file  Physical Activity: Not on file  Stress: Not on file  Social Connections: Not on file  Intimate Partner Violence: Not on file    Review of Systems: Gen: Denies any fever, chills, sweats, anorexia, fatigue, weakness, malaise, weight loss, and sleep disorder HEENT: No visual complaints, No history of Retinopathy. Normal external appearance No Epistaxis or Sore throat. No sinusitis.   CV: Denies chest pain increasing shortness of breath and increasing peripheral edema Resp: Denies cough, sputum, wheezing, coughing up blood, and pleurisy. GI: Denies vomiting blood, jaundice, and fecal incontinence.   Denies dysphagia or odynophagia. GU : Denies urinary burning, blood in urine, urinary frequency, urinary hesitancy, nocturnal urination, and urinary incontinence.  No renal calculi. MS: Denies joint pain, limitation of movement, and  swelling, stiffness, low back pain, extremity pain. Denies muscle weakness, cramps, atrophy.  No use of non steroidal antiinflammatory drugs. Derm: Denies rash, itching, dry skin, hives, moles, warts, or unhealing ulcers.  Psych: Denies depression, anxiety, memory loss, suicidal ideation, hallucinations, paranoia, and confusion. Heme: Denies bruising, bleeding, and enlarged lymph nodes. Neuro: No  headache.  No diplopia. No dysarthria.  No dysphasia.  No history of CVA.  No Seizures. No paresthesias.  No weakness. Endocrine No DM.  No Thyroid disease.  No Adrenal disease.  Physical Exam: Vital signs in last 24 hours: Temp:  [97.2 F (36.2 C)-97.4 F (36.3 C)] 97.4 F (36.3 C) (02/15 2203) Pulse Rate:  [74-81] 74 (02/16 1015) Resp:  [12-28] 18 (02/16 1015) BP: (107-169)/(51-143) 108/58 (02/16 1015) SpO2:  [89 %-99 %] 89 % (02/16 1015) Weight:  [106.1 kg] 106.1 kg (02/15 2350)   General:   Alert,  Well-developed, well-nourished, pleasant and cooperative in NAD Head:  Normocephalic and atraumatic. Eyes:  Sclera clear, no icterus.   Conjunctiva pink. Ears:  Normal auditory acuity. Nose:  No deformity, discharge,  or lesions. Mouth:  No deformity or lesions, dentition normal. Neck:  Supple; no masses or thyromegaly. JVP not elevated Lungs:  Clear throughout to auscultation.   No wheezes, crackles, or rhonchi. No acute distress. Heart:  Regular rate and rhythm; no murmurs, clicks, rubs,  or gallops. Abdomen:  Soft, nontender and nondistended. No masses, hepatosplenomegaly or hernias noted. Normal bowel sounds, without guarding, and without rebound.   Msk:  Symmetrical without gross deformities. Normal posture. Pulses:  No carotid, renal, femoral bruits. DP and PT symmetrical and equal Extremities: Increased lower extremity edema Neurologic:  Alert and  oriented x4;  grossly normal neurologically. Skin:  Intact without significant lesions or rashes. Cervical Nodes:  No significant cervical adenopathy. Psych:  Alert and cooperative. Normal mood and affect.  Intake/Output from previous day: 02/15 0701 - 02/16 0700 In: -  Out: 500 [Urine:500] Intake/Output this shift: No intake/output data recorded.  Lab Results: Recent Labs    04/10/20 1735 04/11/20 0330  WBC 7.4 7.2  HGB 10.4* 9.7*  HCT 31.9* 29.7*  PLT 171 145*   BMET Recent Labs    04/10/20 1735 04/11/20 0330   NA 137 139  K 4.5 4.5  CL 105 106  CO2 19* 19*  GLUCOSE 126* 115*  BUN 105* 109*  CREATININE 4.23* 4.44*  CALCIUM 8.5* 8.3*   LFT Recent Labs    04/11/20 0330  PROT 6.0*  ALBUMIN 3.4*  AST 34  ALT 23  ALKPHOS 66  BILITOT 1.0   PT/INR No results for input(s): LABPROT, INR in the last 72 hours. Hepatitis Panel No results for input(s): HEPBSAG, HCVAB, HEPAIGM, HEPBIGM in the last 72 hours.  Studies/Results: DG Chest 2 View  Result Date: 04/10/2020 CLINICAL DATA:  Short of breath.  Lower extremity edema EXAM: CHEST - 2 VIEW COMPARISON:  01/18/2019 FINDINGS: Sternotomy wires overlie normal cardiac silhouette. There is chronic pleural thickening with calcifications and basilar scarring. No focal consolidation. No pulmonary edema. No pneumothorax. No acute osseous abnormality. IMPRESSION: 1. No clear acute cardiopulmonary findings. 2. Chronic pleural thickening and scarring at the lung bases. Electronically Signed   By: Suzy Bouchard M.D.   On: 04/10/2020 18:29    Assessment/Plan:  Acute on chronic kidney disease baseline serum creatinine by 2.5 stage IV chronic kidney disease.  Creatinine appears to have increased with concurrent signs of congestive heart failure.  2D echo has been ordered.  Will order renal ultrasound.  Urinalysis does not show any evidence of activity.  There appears to be no administration of any nephrotoxins IV contrast or use of ACE inhibitors or angiotensin receptor blockers.  This may be cardiorenal syndrome.  It may also be consistent with significant right-sided heart failure.  Daily renal function.  Anemia we will check iron studies.  Volume/hypertension patient is being placed on IV Lasix 40 mg every 12 hours we will see how he diuresis.  The dose may need to be increased.  Closely follow I's and O's.  Asbestosis has seen pulmonology in the past.  Has got significant obstructive and restrictive lung disease.  Congestive heart failure we will  continue diuretics at this point.  2D echo pending   LOS: 0 Sherril Croon '@TODAY''@10'$ :18 AM

## 2020-04-11 NOTE — H&P (Signed)
History and Physical    Peter Oneill [id number] DOB: Feb 13, 1941 DOA: 04/10/2020  PCP: [name], MD  Patient coming from: Home.  Chief Complaint: Increasing peripheral edema.  HPI: Peter Oneill is a 80 y.o. male with history of CAD status post stenting and CABG, asbestosis, hypertension, chronic kidney disease baseline creatinine around 2.5 per cardiology notes in December 2021 presents to the ER because of worsening lower extremity edema abdominal girth increase and shortness of breath. Patient states he was advised to increase his Lasix intake by double over the last 1 week despite which patient's shortness of breath and peripheral edema has worsened. Denies chest pain productive cough or fever chills. Patient states his shortness of breath has worsened from baseline and he is usually short of breath from asbestosis.  ED Course: In the ER chest x-ray shows nothing acute. EKG shows abnormal rhythm not sure if it is A. fib. Will consult cardiology. BNP was around 736 and creatinine is 4.2 we do not have old 1 to compare renal labs but per cardiology notes his baseline is around 2.5. Hemoglobin 10.4. Patient was admitted for acute CHF exacerbation with worsening renal function. Covid test is negative UA is unremarkable.  Review of Systems: As per HPI, rest all negative.   Past Medical History:  Diagnosis Date  . Asbestosis ([place])   . Contraindication to percutaneous coronary intervention (PCI)   . Coronary artery disease   . Dyslipidemia   . Hypercholesterolemia   . Hypertension   . Ileus ([place])   . Pleural effusion on left   . Renal failure   . Sleep apnea     Past Surgical History:  Procedure Laterality Date  . BACK SURGERY    . CARDIAC CATHETERIZATION    . CAROTID ENDARTERECTOMY    . CARPAL TUNNEL RELEASE    . CORONARY ARTERY BYPASS GRAFT    . HERNIA REPAIR    . PERCUTANEOUS CORONARY STENT INTERVENTION (PCI-S)     multiple  . STERNOTOMY       reports that he  quit smoking about 43 years ago. His smoking use included cigarettes. He has a 4.75 pack-year smoking history. He has never used smokeless tobacco. He reports current alcohol use. He reports that he does not use drugs.  Allergies  Allergen Reactions  . Atorvastatin Other (See Comments)    Muscle aches    Family History  Problem Relation Age of Onset  . Heart disease Father     Prior to Admission medications   Medication Sig Start Date End Date Taking? Authorizing Provider  allopurinol (ZYLOPRIM) 300 MG tablet Take 300 mg by mouth daily.    [provider]  amLODipine (NORVASC) 10 MG tablet Take 1 tablet (10 mg total) by mouth daily. 02/28/20   [name], MD  ANORO ELLIPTA 62.5-25 MCG/INH AEPB USE 1 INHALATION ONCE DAILY 11/14/19   Marshell Garfinkel, MD  aspirin EC 81 MG tablet Take 81 mg by mouth daily.    [provider]  carvedilol (COREG) 3.125 MG tablet Take 1 tablet (3.125 mg total) by mouth 2 (two) times daily. 02/09/20   [name], MD  cholecalciferol (VITAMIN D3) 25 MCG (1000 UNIT) tablet Take 1,000 Units by mouth daily.    [provider]  clopidogrel (PLAVIX) 75 MG tablet Take 75 mg by mouth daily.    [provider]  ezetimibe (ZETIA) 10 MG tablet Take 10 mg by mouth daily.    [provider]  furosemide (  LASIX) 20 MG tablet Take 20 mg by mouth daily.    [provider]  hydrALAZINE (APRESOLINE) 25 MG tablet Take 1 tablet (25 mg total) by mouth 3 (three) times daily. 03/22/20 03/17/21  [name], MD  latanoprost (XALATAN) 0.005 % ophthalmic solution Place 1 drop into both eyes at bedtime.    [provider]  Multiple Vitamin (MULTIVITAMIN) tablet Take 1 tablet by mouth daily.    [provider]  nitroGLYCERIN (NITROSTAT) 0.4 MG SL tablet Place 1 tablet (0.4 mg total) under the tongue every 5 (five) minutes as needed for chest pain. 03/17/16   [name], MD  Omega-3 Fatty  Acids (FISH OIL PO) Take 2,000 mg by mouth.    [provider]  pantoprazole (PROTONIX) 40 MG tablet TAKE 1 TABLET BY MOUTH  DAILY AS NEEDED 03/23/20   [name], MD  Rosuvastatin Calcium 10 MG CPSP Take 10 mg by mouth daily.    [provider]  silodosin (RAPAFLO) 4 MG CAPS capsule Take 4 mg by mouth daily.    [provider]    Physical Exam: Constitutional: Moderately built and nourished. Vitals:   04/11/20 0000 04/11/20 0030 04/11/20 0100 04/11/20 0130  BP: (!) 126/53 (!) 122/51 (!) 127/54 135/68  Pulse: 80 76 75 80  Resp: '15 15 12 '$ (!) 23  Temp:      TempSrc:      SpO2: 97% 95% 96% 98%  Weight:      Height:       Eyes: Anicteric no pallor. ENMT: No discharge from the ears eyes nose or mouth. Neck: No mass felt. JVD elevated. Respiratory: No rhonchi or crepitations. Cardiovascular: S1-S2 heard. Abdomen: Soft mildly distended nontender bowel sounds present. Musculoskeletal: Bilateral lower extremity edema present. Skin: No rash. Neurologic: Alert awake oriented to time place and person. Moves all extremities. Psychiatric: Appears normal. Normal affect.   Labs on Admission: I have personally reviewed following labs and imaging studies  CBC: Recent Labs  Lab 04/10/20 1735  WBC 7.4  HGB 10.4*  HCT 31.9*  MCV 93.5  PLT [id number]   Basic Metabolic Panel: Recent Labs  Lab 04/10/20 1735  NA 137  K 4.5  CL 105  CO2 19*  GLUCOSE 126*  BUN 105*  CREATININE 4.23*  CALCIUM 8.5*   GFR: Estimated Creatinine Clearance: 17.3 mL/min (A) (by C-G formula based on SCr of 4.23 mg/dL (H)). Liver Function Tests: No results for input(s): AST, ALT, ALKPHOS, BILITOT, PROT, ALBUMIN in the last 168 hours. No results for input(s): LIPASE, AMYLASE in the last 168 hours. No results for input(s): AMMONIA in the last 168 hours. Coagulation Profile: No results for input(s): INR, PROTIME in the last 168 hours. Cardiac Enzymes: No results for input(s):  CKTOTAL, CKMB, CKMBINDEX, TROPONINI in the last 168 hours. BNP (last 3 results) No results for input(s): PROBNP in the last 8760 hours. HbA1C: No results for input(s): HGBA1C in the last 72 hours. CBG: No results for input(s): GLUCAP in the last 168 hours. Lipid Profile: No results for input(s): CHOL, HDL, LDLCALC, TRIG, CHOLHDL, LDLDIRECT in the last 72 hours. Thyroid Function Tests: No results for input(s): TSH, T4TOTAL, FREET4, T3FREE, THYROIDAB in the last 72 hours. Anemia Panel: No results for input(s): VITAMINB12, FOLATE, FERRITIN, TIBC, IRON, RETICCTPCT in the last 72 hours. Urine analysis:    Component Value Date/Time   COLORURINE YELLOW 04/11/2020 [place] 04/11/2020 0152   LABSPEC 1.011 04/11/2020 0152  PHURINE 5.0 04/11/2020 0152   GLUCOSEU NEGATIVE 04/11/2020 0152   HGBUR NEGATIVE 04/11/2020 0152   [place] 04/11/2020 [place] 04/11/2020 0152   PROTEINUR 100 (A) 04/11/2020 0152   NITRITE NEGATIVE 04/11/2020 0152   LEUKOCYTESUR NEGATIVE 04/11/2020 0152   Sepsis Labs: '@LABRCNTIP'$ (procalcitonin:4,lacticidven:4) ) Recent Results (from the past 240 hour(s))  Resp Panel by RT-PCR (Flu A&B, Covid) Nasopharyngeal Swab     Status: None   Collection Time: 04/10/20 11:38 PM   Specimen: Nasopharyngeal Swab; Nasopharyngeal(NP) swabs in vial transport medium  Result Value Ref Range Status   SARS Coronavirus 2 by RT PCR NEGATIVE NEGATIVE Final    Comment: (NOTE) SARS-CoV-2 target nucleic acids are NOT DETECTED.  The SARS-CoV-2 RNA is generally detectable in upper respiratory specimens during the acute phase of infection. The lowest concentration of SARS-CoV-2 viral copies this assay can detect is 138 copies/mL. A negative result does not preclude SARS-Cov-2 infection and should not be used as the sole basis for treatment or other patient management decisions. A negative result may occur with  improper specimen  collection/handling, submission of specimen other than nasopharyngeal swab, presence of viral mutation(s) within the areas targeted by this assay, and inadequate number of viral copies(<138 copies/mL). A negative result must be combined with clinical observations, patient history, and epidemiological information. The expected result is Negative.  Fact Sheet for Patients:  EntrepreneurPulse.com.au  Fact Sheet for Healthcare Providers:  IncredibleEmployment.be  This test is no t yet approved or cleared by the [place] FDA and  has been authorized for detection and/or diagnosis of SARS-CoV-2 by FDA under an Emergency Use Authorization (EUA). This EUA will remain  in effect (meaning this test can be used) for the duration of the COVID-19 declaration under Section 564(b)(1) of the Act, 21 U.S.C.section 360bbb-3(b)(1), unless the authorization is terminated  or revoked sooner.       Influenza A by PCR NEGATIVE NEGATIVE Final   Influenza B by PCR NEGATIVE NEGATIVE Final    Comment: (NOTE) The Xpert Xpress SARS-CoV-2/FLU/RSV plus assay is intended as an aid in the diagnosis of influenza from Nasopharyngeal swab specimens and should not be used as a sole basis for treatment. Nasal washings and aspirates are unacceptable for Xpert Xpress SARS-CoV-2/FLU/RSV testing.  Fact Sheet for Patients: EntrepreneurPulse.com.au  Fact Sheet for Healthcare Providers: IncredibleEmployment.be  This test is not yet approved or cleared by the [place] FDA and has been authorized for detection and/or diagnosis of SARS-CoV-2 by FDA under an Emergency Use Authorization (EUA). This EUA will remain in effect (meaning this test can be used) for the duration of the COVID-19 declaration under Section 564(b)(1) of the Act, 21 U.S.C. section 360bbb-3(b)(1), unless the authorization is terminated or revoked.  Performed at [facility], [address], [place], [place] [address]      Radiological Exams on Admission: DG Chest 2 View  Result Date: 04/10/2020 CLINICAL DATA:  Short of breath.  Lower extremity edema EXAM: CHEST - 2 VIEW COMPARISON:  01/18/2019 FINDINGS: Sternotomy wires overlie normal cardiac silhouette. There is chronic pleural thickening with calcifications and basilar scarring. No focal consolidation. No pulmonary edema. No pneumothorax. No acute osseous abnormality. IMPRESSION: 1. No clear acute cardiopulmonary findings. 2. Chronic pleural thickening and scarring at the lung bases. Electronically Signed   By: Suzy Bouchard M.D.   On: 04/10/2020 18:29    EKG: Independently reviewed. Abnormal rhythm unclear if is afebrile discussed with cardiology.  Assessment/Plan Principal Problem:  Acute CHF (congestive heart failure) (HCC) Active Problems:   Coronary artery disease   Hyperlipidemia   Asbestosis ([place])   Hypertension   ARF (acute renal failure) ([place])    1. Acute CHF unknown EF will check 2D echo. Presently has received 40 mg IV Lasix will continue 40 IV every 12 follow intake output metabolic panel daily weights. Will consult cardiology. 2. Acute on chronic kidney disease stage III -we do not have old labs to compare but per cardiology notes patient's creatinine is at around baseline 2.5. Markedly increased at this time. Will follow intake output and metabolic panel. May need nephrology input if there is any further worsening. UA shows 100 proteins. 3. Abnormal rhythm seen in the EKG will discuss with cardiology. Not sure if it is A. Fib. 4. CAD status post CABG and stenting denies any chest pain. On aspirin statins antiplatelet agents and beta-blockers. 5. Hypertension on amlodipine and beta-blockers. 6. History of asbestosis being followed by pulmonologist.  Since patient has worsening renal function with fluid overload will need close monitoring for any further worsening in  inpatient status.   DVT prophylaxis: Heparin. Code Status: Full code. Family Communication: Discussed with patient. Disposition Plan: Home. Consults called: We will consult cardiology. Admission status: Inpatient.   Rise Patience MD [facility] Pager [phone].  If 7PM-7AM, please contact night-coverage [email or web address] Password [facility]  04/11/2020, 2:43 AM

## 2020-04-11 NOTE — Progress Notes (Signed)
  Echocardiogram 2D Echocardiogram has been performed.  Randa Lynn Venda Dice 04/11/2020, 2:36 PM

## 2020-04-11 NOTE — ED Notes (Signed)
Doctor is still talking with pt and family im about to see if we can go ahead and move pt

## 2020-04-11 NOTE — Progress Notes (Signed)
PROGRESS NOTE  Peter Oneill [id number] DOB: [birth year] DOA: 04/10/2020 PCP: [name], MD  Brief History   The patient is a 80 yr old man who presented to [facility] with complaints of increasing lower extremity edema and shortness of breath. The patient was asked to come to the ED after a visit to the hypertension clinic. The patient had had a change in medications at the end of January. In the last week he had doubled the dose of lasix he was taken. He has gained about 25 lbs since January.   He carries a past medical history significant for CAD s/p stenting for CABG, asbestosis, hypertension, CKD IIIa, hypertension, and OSA.  In the ED he was found to have a creatinine of 4.23 with a GFR of 14. The patient's baseline creatinine is 2.6. He had a metabolic acidosis with a bicarbonate of 19.  BNP is elevated at 736.9. CXR demonstrated no acute cardiopulmonary findings.  [facility] were consulted to admit the patient for further evaluation and treatment. The patient was admitted to a telemetry bed by my colleague, Dr. Hal Hope early this morning. Cardiology and nephrology have been consulted. He is receiving IV lasix. His I's and O's are being carefully monitored.  Consultants  . Nephrology . Cardiology  Procedures  . None  Antibiotics   Anti-infectives (From admission, onward)   None    .  Subjective  The patient is resting comfortably. No new complaints.  Objective   Vitals:  Vitals:   04/11/20 1015 04/11/20 1100  BP: (!) 108/58   Pulse: 74 75  Resp: 18 16  Temp:    SpO2: (!) 89% 98%    Exam:  Constitutional:  . Appears calm and comfortable Respiratory:  . CTA bilaterally, no w/r/r.  . Respiratory effort normal. No retractions or accessory muscle use Cardiovascular:  . RRR, no m/r/g . No LE extremity edema   . Normal pedal pulses Abdomen:  . Abdomen appears normal; no tenderness or masses . No hernias . No HSM Musculoskeletal:  . No cyanosis or  clubbing . Positive for 3-4 + pitting edema Skin:  . No rashes, lesions, ulcers . palpation of skin: no induration or nodules Neurologic:  . CN 2-12 intact . Sensation all 4 extremities intact Psychiatric:  . Mental status o Mood, affect appropriate o Orientation to person, place, time  . judgment and insight appear intact    I have personally reviewed the following:   Today's Data  . CBC, CMP  Imaging  . CXR  Cardiology Data  . Echocardiogram is pending.  Scheduled Meds: . allopurinol  300 mg Oral Daily  . amLODipine  10 mg Oral Daily  . aspirin EC  81 mg Oral Daily  . carvedilol  3.125 mg Oral BID  . clopidogrel  75 mg Oral Daily  . ezetimibe  10 mg Oral Daily  . furosemide  40 mg Intravenous BID  . heparin  5,000 Units Subcutaneous Q8H  . hydrALAZINE  25 mg Oral TID  . latanoprost  1 drop Both Eyes QHS  . omega-3 acid ethyl esters  1 g Oral Daily  . rosuvastatin  10 mg Oral Daily  . tamsulosin  0.4 mg Oral QPC supper  . umeclidinium-vilanterol  1 puff Inhalation Daily   Principal Problem:   Acute CHF (congestive heart failure) (HCC) Active Problems:   Coronary artery disease   Hyperlipidemia   Asbestosis ([place])   Hypertension   ARF (acute renal failure) ([place])   LOS: 0  days   A & P   Acute Systolic CHF:  Echo performed prior to CABG in 2016 demonstrated an EF of 60%. 2D echo is p ending. Patient is receiving 40 IV every 12 hours. Will monitor volume status, renal status and electrolytes. Cardiology consulted. Must monitor closely for worsening.   Acute on chronic kidney disease stage III. It seems that the patient's baseline creatinine is 2.61 and corresponds to a CKD IIIa. Creatinine this am is 4.44. Nephrology has been consulted. Will follow creatinine, electrolytes, and volume status. Avoid nephrotoxic substances and hypotension. Must monitor closely for worsening.  Proteinuria: Noted. Nephrology consulted.   Second degree AV block: Unable to determine  if this is [place] 1 or 2 as the p wave is difficult to see as it is frequntly buried in ST segment. Cardiology consulted. Continue to monitor on telemetry.   CAD status post CABG and stenting denies any chest pain. On aspirin statins antiplatelet agents and beta-blockers. Cardiology consulted.  Essential Hypertension: Pt is on amlodipine 10 mg daily, and carvidilol 6.25 at home. He has been continued on amlodipine 10 mg daily and carvedilol 3.125 mg bid. Monitor.  History of asbestosis being followed by pulmonologist. Pt is not on O2 at home.  I have seen and examined this patient myself. I have spent  37 minutes in he evaluation and care.  DVT prophylaxis: Heparin. Code Status: Full code. Family Communication: Discussed with patient. Disposition Plan: Home.  Amita Atayde, DO [facility] Direct contact: see [email or web address]  7PM-7AM contact night coverage as above 04/11/2020, 1:34 PM  LOS: 0 days

## 2020-04-11 NOTE — Consult Note (Signed)
Cardiology Consultation:   Patient ID: Peter Oneill MRN: [id number]; DOB: Aug 21, 1940  Admit date: 04/10/2020 Date of Consult: 04/11/2020  PCP:  [name], MD   [place]  Cardiologist:  Larae Grooms, MD  Advanced Practice Provider:  No care team member to display Electrophysiologist:  None [phone]    Patient Profile:   Peter Oneill is a 80 y.o. male with a PMH of CAD s/p CABG in 2003 with repeat CABG in 2016 and subsequent stenting to LCx in 2017, carotid artery disease s/p CEA in 1996, HTN, HLD, CKD stage IV, and OSA, who is being seen today for the evaluation of CHF at the request of Dr. Benny Lennert.  History of Present Illness:   Peter Oneill wife contacted our office 04/10/20 to report swelling in the patients legs, stomach, and face over the last 2 weeks and decrease in urine output. She reported weight gain of 15lbs despite doubling his lasix dose to '40mg'$  daily. He was seen by his PCP 04/10/20 and recommended to present to the ER after BMET results that day showed Cr 4.45, up from baseline 2.77.   He was last evaluated by cardiology at an outpatient visit with Dr. Irish Lack 02/09/20, at which time he was without chest pain. His carvedilol was de-escalated given bradycardia with associated 2 second pauses and he was started on amlodipine '5mg'$  daily. He was recommended for repeat carotid dopplers 03/2020 for monitoring of his carotid artery disease which showed stable moderate disease - planning to repeat 03/2021. He followed up in the HTN clinic, at which time his carvedilol was reduced to 3.'125mg'$  BID, amlodipine increased to '10mg'$  daily, and more recently was started on hydralazine '25mg'$  TID.   On arrival to the ED patient was hypertensive, otherwise VSS. Labs notable for electrolytes wnl, Cr 4.24>4.44 (baseline 2.77), Hgb 10.4> 9.7 (12.5 08/2019), PLT 145, BNP 736, TSH wnl, influenza/COVID-19 negative. CXR showed no acute findings. Renal US suggestive of  medical renal disease without hydronephrosis or renal obstruction with mild ascites. He was admitted to medicine and started on IV lasix '40mg'$  BID with UOP -500cc. Nephrology consulted given AoCKD. Cardiology asked to evaluate for CHF.   At the time of this evaluation he notes some mild improvement in his leg and facial swelling. He reports increased DOE over the past several weeks. He and his wife attribute the change in his swelling to recent increase in amlodipine and starting hydralazine. He denies orthopnea or PND. He reported an episode c/w vertigo a few weeks ago but otherwise has had no lightheadedness or syncope. He denies any trouble with chest pain recently.     Past Medical History:  Diagnosis Date  . Asbestosis ([place])   . Contraindication to percutaneous coronary intervention (PCI)   . Coronary artery disease   . Dyslipidemia   . Hypercholesterolemia   . Hypertension   . Ileus ([place])   . Pleural effusion on left   . Renal failure   . Sleep apnea     Past Surgical History:  Procedure Laterality Date  . BACK SURGERY    . CARDIAC CATHETERIZATION    . CAROTID ENDARTERECTOMY    . CARPAL TUNNEL RELEASE    . CORONARY ARTERY BYPASS GRAFT    . HERNIA REPAIR    . PERCUTANEOUS CORONARY STENT INTERVENTION (PCI-S)     multiple  . STERNOTOMY       Home Medications:  Prior to Admission medications   Medication Sig Start Date End Date Taking? Authorizing  Provider  allopurinol (ZYLOPRIM) 300 MG tablet Take 300 mg by mouth daily.    [provider]  amLODipine (NORVASC) 10 MG tablet Take 1 tablet (10 mg total) by mouth daily. 02/28/20   [name], MD  ANORO ELLIPTA 62.5-25 MCG/INH AEPB USE 1 INHALATION ONCE DAILY 11/14/19   Marshell Garfinkel, MD  aspirin EC 81 MG tablet Take 81 mg by mouth daily.    [provider]  carvedilol (COREG) 3.125 MG tablet Take 1 tablet (3.125 mg total) by mouth 2 (two) times daily. 02/09/20   [name], MD   cholecalciferol (VITAMIN D3) 25 MCG (1000 UNIT) tablet Take 1,000 Units by mouth daily.    [provider]  clopidogrel (PLAVIX) 75 MG tablet Take 75 mg by mouth daily.    [provider]  ezetimibe (ZETIA) 10 MG tablet Take 10 mg by mouth daily.    [provider]  furosemide (LASIX) 20 MG tablet Take 20 mg by mouth daily.    [provider]  hydrALAZINE (APRESOLINE) 25 MG tablet Take 1 tablet (25 mg total) by mouth 3 (three) times daily. 03/22/20 03/17/21  [name], MD  latanoprost (XALATAN) 0.005 % ophthalmic solution Place 1 drop into both eyes at bedtime.    [provider]  Multiple Vitamin (MULTIVITAMIN) tablet Take 1 tablet by mouth daily.    [provider]  nitroGLYCERIN (NITROSTAT) 0.4 MG SL tablet Place 1 tablet (0.4 mg total) under the tongue every 5 (five) minutes as needed for chest pain. 03/17/16   [name], MD  Omega-3 Fatty Acids (FISH OIL PO) Take 2,000 mg by mouth.    [provider]  pantoprazole (PROTONIX) 40 MG tablet TAKE 1 TABLET BY MOUTH  DAILY AS NEEDED 03/23/20   [name], MD  Rosuvastatin Calcium 10 MG CPSP Take 10 mg by mouth daily.    [provider]  silodosin (RAPAFLO) 4 MG CAPS capsule Take 4 mg by mouth daily.    [provider]    Inpatient Medications: Scheduled Meds: . allopurinol  300 mg Oral Daily  . amLODipine  10 mg Oral Daily  . aspirin EC  81 mg Oral Daily  . carvedilol  3.125 mg Oral BID  . clopidogrel  75 mg Oral Daily  . ezetimibe  10 mg Oral Daily  . furosemide  40 mg Intravenous BID  . heparin  5,000 Units Subcutaneous Q8H  . hydrALAZINE  25 mg Oral TID  . latanoprost  1 drop Both Eyes QHS  . omega-3 acid ethyl esters  1 g Oral Daily  . rosuvastatin  10 mg Oral Daily  . tamsulosin  0.4 mg Oral QPC supper  . umeclidinium-vilanterol  1 puff Inhalation Daily   Continuous Infusions:  PRN Meds: acetaminophen **OR**  acetaminophen, nitroGLYCERIN  Allergies:    Allergies  Allergen Reactions  . Atorvastatin Other (See Comments)    Muscle aches    Social History:   Social History   Socioeconomic History  . Marital status: Married    Spouse name: Not on file  . Number of children: Not on file  . Years of education: Not on file  . Highest education level: Not on file  Occupational History  . Not on file  Tobacco Use  . Smoking status: Former Smoker    Packs/day: 0.25    Years: 19.00    Pack years: 4.75    Types: Cigarettes    Quit date: 12/10/1976  Years since quitting: 43.3  . Smokeless tobacco: Never Used  Vaping Use  . Vaping Use: Never used  Substance and Sexual Activity  . Alcohol use: Yes    Comment: occasional  . Drug use: No  . Sexual activity: Yes    Comment: married  Other Topics Concern  . Not on file  Social History Narrative  . Not on file   Social Determinants of Health   Financial Resource Strain: Not on file  Food Insecurity: Not on file  Transportation Needs: Not on file  Physical Activity: Not on file  Stress: Not on file  Social Connections: Not on file  Intimate Partner Violence: Not on file    Family History:    Family History  Problem Relation Age of Onset  . Heart disease Father      ROS:  Please see the history of present illness.   All other ROS reviewed and negative.     Physical Exam/Data:   Vitals:   04/11/20 0651 04/11/20 0907 04/11/20 1015 04/11/20 1100  BP: (!) 134/56 (!) 141/70 (!) 108/58   Pulse: 80 75 74 75  Resp: '13  18 16  '$ Temp:      TempSrc:      SpO2: 91%  (!) 89% 98%  Weight:      Height:        Intake/Output Summary (Last 24 hours) at 04/11/2020 1222 Last data filed at 04/11/2020 0606 Gross per 24 hour  Intake -  Output 500 ml  Net -500 ml   Last 3 Weights 04/10/2020 02/09/2020 10/25/2019  Weight (lbs) 234 lb 218 lb 9.6 oz 217 lb  Weight (kg) 106.142 kg 99.156 kg 98.431 kg     Body mass index is 34.56 kg/m.   General:  Well nourished, well developed, in no acute distress HEENT: sclera anicteric Neck: no JVD Vascular: No carotid bruits; distal pulses 2+ bilaterally Cardiac:  normal S1, S2; RRR; no murmurs, rubs, or gallops Lungs:  clear to auscultation bilaterally, no wheezing, rhonchi or rales  Abd: soft, distended, non-tender, NABS. Ext: 2-3+ b/l LE edema, mild pitting in thighs  Musculoskeletal:  No deformities, BUE and BLE strength normal and equal Skin: warm and dry  Neuro:  CNs 2-12 intact, no focal abnormalities noted Psych:  Normal affect   EKG:  The EKG was personally reviewed and demonstrates:  Sinus rhythm with rate 80bpm, 1st degree AV block and mobitz 1, no STE/D Telemetry:  Telemetry was personally reviewed and demonstrates:  Sinus rhythm with 1st degree AV block and occasional Mobitz 2  Relevant CV Studies: Echo from 2016 with EF 60% - reviewed in scanned documents.   Laboratory Data:  High Sensitivity Troponin:  No results for input(s): TROPONINIHS in the last 720 hours.   Chemistry Recent Labs  Lab 04/10/20 1735 04/11/20 0330  NA 137 139  K 4.5 4.5  CL 105 106  CO2 19* 19*  GLUCOSE 126* 115*  BUN 105* 109*  CREATININE 4.23* 4.44*  CALCIUM 8.5* 8.3*  GFRNONAA 14* 13*  ANIONGAP 13 14    Recent Labs  Lab 04/11/20 0330  PROT 6.0*  ALBUMIN 3.4*  AST 34  ALT 23  ALKPHOS 66  BILITOT 1.0   Hematology Recent Labs  Lab 04/10/20 1735 04/11/20 0330  WBC 7.4 7.2  RBC 3.41* 3.22*  HGB 10.4* 9.7*  HCT 31.9* 29.7*  MCV 93.5 92.2  MCH 30.5 30.1  MCHC 32.6 32.7  RDW 15.9* 15.8*  PLT 171 145*  BNP Recent Labs  Lab 04/10/20 1735  BNP 736.9*    DDimer No results for input(s): DDIMER in the last 168 hours.   Radiology/Studies:  DG Chest 2 View  Result Date: 04/10/2020 CLINICAL DATA:  Short of breath.  Lower extremity edema EXAM: CHEST - 2 VIEW COMPARISON:  01/18/2019 FINDINGS: Sternotomy wires overlie normal cardiac silhouette. There is chronic  pleural thickening with calcifications and basilar scarring. No focal consolidation. No pulmonary edema. No pneumothorax. No acute osseous abnormality. IMPRESSION: 1. No clear acute cardiopulmonary findings. 2. Chronic pleural thickening and scarring at the lung bases. Electronically Signed   By: Suzy Bouchard M.D.   On: 04/10/2020 18:29   US RENAL  Result Date: 04/11/2020 CLINICAL DATA:  Acute kidney failure. EXAM: RENAL / URINARY TRACT ULTRASOUND COMPLETE COMPARISON:  October 14, 2018. FINDINGS: Right Kidney: Renal measurements: 10.4 x 4.5 x 4.4 cm = volume: 108 mL. Increased echogenicity of renal parenchyma is noted suggesting medical renal disease. Cortical thinning is noted. No mass or hydronephrosis visualized. Left Kidney: Renal measurements: 10.8 x 4.4 x 4.8 cm = volume: 146 mL. Increased echogenicity of renal parenchyma is noted. Cortical thinning is noted. No mass or hydronephrosis visualized. Bladder: Not well visualized as patient recently voided. Other: Mild ascites is noted. IMPRESSION: Increased echogenicity of renal parenchyma is noted suggesting medical renal disease. No hydronephrosis or renal obstruction is noted. Bilateral renal cortical thinning is noted as well. Mild ascites is noted Electronically Signed   By: Marijo Conception M.D.   On: 04/11/2020 11:42     Assessment and Plan:   1. Acute CHF: patient presented with LE edema, abdominal swelling, and weight gain of 15lbs over the past 2 weeks despite increasing home po lasix from '20mg'$  daily to '40mg'$  daily. CXR showed no acute findings. BNP up to 700s. Echo is pending. Nephrology following given AoCKD stage 4. He was started on IV lasix '40mg'$  BID with UOP documented -500cc. He continues to have significant LE edema and abdominal distention.  - Agree with IV lasix - low threshold to uptitrate given degree of CKD and LE edema - Will follow-up echo results to evaluate LV function, wall motion, and valvular function - Continue to  monitor strict I&Os and daily weights - Continue to monitor electrolytes closely and replete as needed to maintain K>4, Mg >2 - Continue carvedilol - Could consider de-escalating amlodipine to '5mg'$  daily to minimize LE edema.   2. HTN: BP generally elevated above goal. Management complicated by baseline bradycardia and 1st/2nd degree AV block and CKD stage IV. He has been following with our HTN clinic and recently started on hydralazine and increased dose of amlodipine '10mg'$  daily. Now with significant LE edema - Continue carvedilol and hydralazine - Could consider reducing amlodipine to '5mg'$  daily to minimize LE edema - Could consider addition of doxazosin per pharmacy recommendations for improved BP control  3. CAD s/p CABG x2: patient underwent CABG in 2003 with repeat in 2016 and subsequent PCI to LCx in 2017. No complaints of chest pain, though he does note worsening DOE over the past few weeks. Echo is pending - Await echo results to evaluate LV function - Continue aspirin and plavix - Continue statin, omega 3, and zetia - Continue carvedilol  4. HLD: LDL 100 02/2019; goal <100; intolerant to higher dose statins - Continue rosuvastatin, omega 3, and zetia - Consider referral to lipid clinic at discharge for PSK9-inhibitor consideration.   5. AoCKD stage IV: Cr up to 4.44  today, from baseline 2.7. Nephrology following.  - Continue close monitoring with diuresis as above  6. Carotid artery disease: s/p remote CEA with stable moderate disease on carotid dopplers 04/03/20.  - Continue aspirin and statin - Continue routine outpatient monitoring    Risk Assessment/Risk Scores:        New York Heart Association (NYHA) Functional Class NYHA Class III        For questions or updates, please contact [facility] Please consult [email or web address] for contact info under    Signed, Abigail Butts, PA-C  04/11/2020 12:22 PM

## 2020-04-11 NOTE — ED Notes (Signed)
Tele Breakfast order placed 

## 2020-04-11 NOTE — Plan of Care (Signed)
  Problem: Activity: Goal: Risk for activity intolerance will decrease Outcome: Progressing   Problem: Nutrition: Goal: Adequate nutrition will be maintained Outcome: Progressing   

## 2020-04-12 ENCOUNTER — Inpatient Hospital Stay (HOSPITAL_COMMUNITY): Payer: Medicare HMO

## 2020-04-12 DIAGNOSIS — I10 Essential (primary) hypertension: Secondary | ICD-10-CM

## 2020-04-12 DIAGNOSIS — N186 End stage renal disease: Secondary | ICD-10-CM

## 2020-04-12 DIAGNOSIS — I2510 Atherosclerotic heart disease of native coronary artery without angina pectoris: Secondary | ICD-10-CM | POA: Diagnosis not present

## 2020-04-12 DIAGNOSIS — N179 Acute kidney failure, unspecified: Secondary | ICD-10-CM | POA: Diagnosis not present

## 2020-04-12 LAB — SODIUM, URINE, RANDOM: Sodium, Ur: 27 mmol/L

## 2020-04-12 LAB — RENAL FUNCTION PANEL
Albumin: 2.9 g/dL — ABNORMAL LOW (ref 3.5–5.0)
Anion gap: 12 (ref 5–15)
BUN: 110 mg/dL — ABNORMAL HIGH (ref 8–23)
CO2: 20 mmol/L — ABNORMAL LOW (ref 22–32)
Calcium: 8 mg/dL — ABNORMAL LOW (ref 8.9–10.3)
Chloride: 106 mmol/L (ref 98–111)
Creatinine, Ser: 4.38 mg/dL — ABNORMAL HIGH (ref 0.61–1.24)
GFR, Estimated: 13 mL/min — ABNORMAL LOW (ref >60)
Glucose, Bld: 115 mg/dL — ABNORMAL HIGH (ref 70–99)
Phosphorus: 6.1 mg/dL — ABNORMAL HIGH (ref 2.5–4.6)
Potassium: 4.1 mmol/L (ref 3.5–5.1)
Sodium: 138 mmol/L (ref 135–145)

## 2020-04-12 LAB — FERRITIN: Ferritin: 82 ng/mL (ref 24–336)

## 2020-04-12 MED ORDER — SODIUM CHLORIDE 0.9 % IV SOLN
510.0000 mg | Freq: Once | INTRAVENOUS | Status: DC
  Administered 2020-04-12: 510 mg via INTRAVENOUS
  Filled 2020-04-12: qty 17

## 2020-04-12 MED ORDER — SODIUM CHLORIDE 0.9 % IV SOLN
510.0000 mg | INTRAVENOUS | Status: DC
  Filled 2020-04-12: qty 17

## 2020-04-12 NOTE — Plan of Care (Signed)
  Problem: Nutrition: Goal: Adequate nutrition will be maintained Outcome: Progressing   Problem: Elimination: Goal: Will not experience complications related to bowel motility Outcome: Progressing Goal: Will not experience complications related to urinary retention Outcome: Progressing   

## 2020-04-12 NOTE — Progress Notes (Signed)
[facility] ROUNDING NOTE   Subjective:   Interval History: 80 year old gentleman who has a history of coronary artery disease status post CABG, also has a history of chronic asbestosis, history of hypertension presents to the emergency room with increasing lower extremity edema and shortness of breath.  His baseline creatinine is about 2.5 mg/dL he was last evaluated in December 2021.  There have been very little change in his renal function. He is asked to present to the emergency room due to the above and labs that showed an increase in creatinine to 4.2 mg/dL.  Urine study shows 0-5 WBCs 0-5 RBCs per high-powered field.  Last renal ultrasound was performed in August 2020 that was essentially unremarkable.   Blood pressure 136/66 pulse 65 temperature 98 O2 sats 95% room air Urine output 1000 cc 04/11/2020  Sodium 138 potassium 4.1 chloride 106 CO2 20 BUN 110 creatinine 3.38 glucose 115 calcium 8 phosphorus 6.1 albumin 2.9 iron saturation is 10% hemoglobin 9.7   Objective:  Vital signs in last 24 hours:  Temp:  [97.5 F (36.4 C)-98.1 F (36.7 C)] 98 F (36.7 C) (02/17 0955) Pulse Rate:  [75-94] 88 (02/17 0800) Resp:  [14-20] 18 (02/17 0800) BP: (132-158)/(65-74) 156/66 (02/17 0800) SpO2:  [95 %-98 %] 95 % (02/17 0459) Weight:  [104.6 kg-105.1 kg] 104.6 kg (02/17 0505)  Weight change: -1.043 kg Filed Weights   04/10/20 2350 04/11/20 1751 04/12/20 0505  Weight: 106.1 kg 105.1 kg 104.6 kg    Intake/Output: I/O last 3 completed shifts: In: 320 [P.O.:320] Out: 1325 [Urine:1325]   Intake/Output this shift:  Total I/O In: 390 [P.O.:390] Out: 175 [Urine:175]  CVS-regular rate and rhythm RS-clear to auscultation ABD- BS present soft non-distended EXT-lower extremity edema   Basic Metabolic Panel: Recent Labs  Lab 04/10/20 1735 04/11/20 0330 04/12/20 0230  NA 137 139 138  K 4.5 4.5 4.1  CL 105 106 106  CO2 19* 19* 20*  GLUCOSE 126* 115* 115*  BUN 105*  109* 110*  CREATININE 4.23* 4.44* 4.38*  CALCIUM 8.5* 8.3* 8.0*  MG  --  2.8*  --   PHOS  --   --  6.1*    Liver Function Tests: Recent Labs  Lab 04/11/20 0330 04/12/20 0230  AST 34  --   ALT 23  --   ALKPHOS 66  --   BILITOT 1.0  --   PROT 6.0*  --   ALBUMIN 3.4* 2.9*   No results for input(s): LIPASE, AMYLASE in the last 168 hours. No results for input(s): AMMONIA in the last 168 hours.  CBC: Recent Labs  Lab 04/10/20 1735 04/11/20 0330  WBC 7.4 7.2  HGB 10.4* 9.7*  HCT 31.9* 29.7*  MCV 93.5 92.2  PLT 171 145*    Cardiac Enzymes: No results for input(s): CKTOTAL, CKMB, CKMBINDEX, TROPONINI in the last 168 hours.  BNP: Invalid input(s): POCBNP  CBG: No results for input(s): GLUCAP in the last 168 hours.  Microbiology: Results for orders placed or performed during the hospital encounter of 04/10/20  Resp Panel by RT-PCR (Flu A&B, Covid) Nasopharyngeal Swab     Status: None   Collection Time: 04/10/20 11:38 PM   Specimen: Nasopharyngeal Swab; Nasopharyngeal(NP) swabs in vial transport medium  Result Value Ref Range Status   SARS Coronavirus 2 by RT PCR NEGATIVE NEGATIVE Final    Comment: (NOTE) SARS-CoV-2 target nucleic acids are NOT DETECTED.  The SARS-CoV-2 RNA is generally detectable in upper respiratory specimens during the acute  phase of infection. The lowest concentration of SARS-CoV-2 viral copies this assay can detect is 138 copies/mL. A negative result does not preclude SARS-Cov-2 infection and should not be used as the sole basis for treatment or other patient management decisions. A negative result may occur with  improper specimen collection/handling, submission of specimen other than nasopharyngeal swab, presence of viral mutation(s) within the areas targeted by this assay, and inadequate number of viral copies(<138 copies/mL). A negative result must be combined with clinical observations, patient history, and epidemiological information.  The expected result is Negative.  Fact Sheet for Patients:  EntrepreneurPulse.com.au  Fact Sheet for Healthcare Providers:  IncredibleEmployment.be  This test is no t yet approved or cleared by the [place] FDA and  has been authorized for detection and/or diagnosis of SARS-CoV-2 by FDA under an Emergency Use Authorization (EUA). This EUA will remain  in effect (meaning this test can be used) for the duration of the COVID-19 declaration under Section 564(b)(1) of the Act, 21 U.S.C.section 360bbb-3(b)(1), unless the authorization is terminated  or revoked sooner.       Influenza A by PCR NEGATIVE NEGATIVE Final   Influenza B by PCR NEGATIVE NEGATIVE Final    Comment: (NOTE) The Xpert Xpress SARS-CoV-2/FLU/RSV plus assay is intended as an aid in the diagnosis of influenza from Nasopharyngeal swab specimens and should not be used as a sole basis for treatment. Nasal washings and aspirates are unacceptable for Xpert Xpress SARS-CoV-2/FLU/RSV testing.  Fact Sheet for Patients: EntrepreneurPulse.com.au  Fact Sheet for Healthcare Providers: IncredibleEmployment.be  This test is not yet approved or cleared by the [place] FDA and has been authorized for detection and/or diagnosis of SARS-CoV-2 by FDA under an Emergency Use Authorization (EUA). This EUA will remain in effect (meaning this test can be used) for the duration of the COVID-19 declaration under Section 564(b)(1) of the Act, 21 U.S.C. section 360bbb-3(b)(1), unless the authorization is terminated or revoked.  Performed at [facility], [place] [address] [place], [place] [address]     Coagulation Studies: No results for input(s): LABPROT, INR in the last 72 hours.  Urinalysis: Recent Labs    04/11/20 0152  COLORURINE YELLOW  LABSPEC 1.011  PHURINE 5.0  GLUCOSEU NEGATIVE  HGBUR NEGATIVE  BILIRUBINUR NEGATIVE  KETONESUR  NEGATIVE  PROTEINUR 100*  NITRITE NEGATIVE  LEUKOCYTESUR NEGATIVE      Imaging: DG Chest 2 View  Result Date: 04/10/2020 CLINICAL DATA:  Short of breath.  Lower extremity edema EXAM: CHEST - 2 VIEW COMPARISON:  01/18/2019 FINDINGS: Sternotomy wires overlie normal cardiac silhouette. There is chronic pleural thickening with calcifications and basilar scarring. No focal consolidation. No pulmonary edema. No pneumothorax. No acute osseous abnormality. IMPRESSION: 1. No clear acute cardiopulmonary findings. 2. Chronic pleural thickening and scarring at the lung bases. Electronically Signed   By: Suzy Bouchard M.D.   On: 04/10/2020 18:29   US RENAL  Result Date: 04/11/2020 CLINICAL DATA:  Acute kidney failure. EXAM: RENAL / URINARY TRACT ULTRASOUND COMPLETE COMPARISON:  October 14, 2018. FINDINGS: Right Kidney: Renal measurements: 10.4 x 4.5 x 4.4 cm = volume: 108 mL. Increased echogenicity of renal parenchyma is noted suggesting medical renal disease. Cortical thinning is noted. No mass or hydronephrosis visualized. Left Kidney: Renal measurements: 10.8 x 4.4 x 4.8 cm = volume: 146 mL. Increased echogenicity of renal parenchyma is noted. Cortical thinning is noted. No mass or hydronephrosis visualized. Bladder: Not well visualized as patient recently voided. Other: Mild ascites is noted.  IMPRESSION: Increased echogenicity of renal parenchyma is noted suggesting medical renal disease. No hydronephrosis or renal obstruction is noted. Bilateral renal cortical thinning is noted as well. Mild ascites is noted Electronically Signed   By: Marijo Conception M.D.   On: 04/11/2020 11:42   ECHOCARDIOGRAM COMPLETE  Result Date: 04/11/2020    ECHOCARDIOGRAM REPORT   Patient Name:   Peter Oneill Date of Exam: 04/11/2020 Medical Rec #:  [id number]        Height:       69.0 in Accession #:    [id number]       Weight:       234.0 lb Date of Birth:  07-29-1940         BSA:          2.209 m Patient Age:    9 years          BP:           139/81 mmHg Patient Gender: M                HR:           77 bpm. Exam Location:  Inpatient Procedure: 2D Echo, Cardiac Doppler, Color Doppler and Intracardiac            Opacification Agent Indications:    I50.33 Acute on chronic diastolic (congestive) heart failure  History:        Patient has prior history of Echocardiogram examinations, most                 recent 01/29/2015. CAD; Risk Factors:Sleep Apnea, Dyslipidemia                 and Hypertension.  Sonographer:    Tiffany Dance Referring Phys: 63 Rise Patience  Sonographer Comments: Technically difficult study due to poor echo windows, no subcostal window and suboptimal parasternal window. IMPRESSIONS  1. Left ventricular ejection fraction, by estimation, is 50 to 55%. The left ventricle has low normal function. The left ventricle demonstrates regional wall motion abnormalities. Anteroseptal hypokinesis. There is mild left ventricular hypertrophy. Left ventricular diastolic parameters are indeterminate.  2. Right ventricular systolic function is mildly reduced. The right ventricular size is mildly enlarged. Tricuspid regurgitation signal is inadequate for assessing PA pressure.  3. The mitral valve is grossly normal. No evidence of mitral valve regurgitation.  4. The aortic valve was not well visualized. Aortic valve regurgitation is not visualized. No aortic stenosis is present. FINDINGS  Left Ventricle: Left ventricular ejection fraction, by estimation, is 50 to 55%. The left ventricle has low normal function. The left ventricle demonstrates regional wall motion abnormalities. Definity contrast agent was given IV to delineate the left ventricular endocardial borders. The left ventricular internal cavity size was normal in size. There is mild left ventricular hypertrophy. Left ventricular diastolic parameters are indeterminate. Right Ventricle: The right ventricular size is mildly enlarged. Right vetricular wall thickness was not  well visualized. Right ventricular systolic function is mildly reduced. Tricuspid regurgitation signal is inadequate for assessing PA pressure. Left Atrium: Left atrial size was normal in size. Right Atrium: Right atrial size was normal in size. Pericardium: There is no evidence of pericardial effusion. Mitral Valve: The mitral valve is grossly normal. No evidence of mitral valve regurgitation. Tricuspid Valve: The tricuspid valve is grossly normal. Tricuspid valve regurgitation is not demonstrated. Aortic Valve: The aortic valve was not well visualized. Aortic valve regurgitation is not visualized. No aortic stenosis is present. Pulmonic Valve:  The pulmonic valve was not well visualized. Pulmonic valve regurgitation is not visualized. Aorta: The aortic root and ascending aorta are structurally normal, with no evidence of dilitation. IAS/Shunts: The interatrial septum was not well visualized.  LEFT VENTRICLE PLAX 2D LVIDd:         4.40 cm Diastology LVIDs:         3.60 cm LV e' lateral:   12.10 cm/s LV PW:         1.30 cm LV E/e' lateral: 9.8 LV IVS:        1.00 cm  RIGHT VENTRICLE RV Basal diam:  3.60 cm RV Mid diam:    2.50 cm RV S prime:     6.64 cm/s TAPSE (M-mode): 1.5 cm LEFT ATRIUM             Index       RIGHT ATRIUM           Index LA diam:        5.60 cm 2.54 cm/m  RA Area:     21.20 cm LA Vol (A2C):   70.4 ml 31.88 ml/m RA Volume:   64.30 ml  29.11 ml/m LA Vol (A4C):   45.4 ml 20.56 ml/m LA Biplane Vol: 56.5 ml 25.58 ml/m  AORTIC VALVE LVOT Vmax:   106.00 cm/s LVOT Vmean:  75.300 cm/s LVOT VTI:    0.242 m  AORTA Ao Asc diam: 3.50 cm MITRAL VALVE MV Area (PHT): 2.69 cm     SHUNTS MV Decel Time: 282 msec     Systemic VTI: 0.24 m MV E velocity: 118.00 cm/s MV A velocity: 140.00 cm/s MV E/A ratio:  0.84 Oswaldo Milian MD Electronically signed by Oswaldo Milian MD Signature Date/Time: 04/11/2020/4:35:19 PM    Final      Medications:   . ferumoxytol     . allopurinol  300 mg Oral Daily   . amLODipine  10 mg Oral Daily  . aspirin EC  81 mg Oral Daily  . carvedilol  3.125 mg Oral BID  . clopidogrel  75 mg Oral Daily  . ezetimibe  10 mg Oral Daily  . furosemide  40 mg Intravenous BID  . heparin  5,000 Units Subcutaneous Q8H  . hydrALAZINE  25 mg Oral TID  . latanoprost  1 drop Both Eyes QHS  . omega-3 acid ethyl esters  1 g Oral Daily  . rosuvastatin  10 mg Oral Daily  . tamsulosin  0.4 mg Oral QPC supper  . umeclidinium-vilanterol  1 puff Inhalation Daily   acetaminophen **OR** acetaminophen, nitroGLYCERIN  Assessment/ Plan:   Acute on chronic kidney disease baseline serum creatinine by 2.5 stage IV chronic kidney disease.  Creatinine appears to have increased with concurrent signs of congestive heart failure.  2D echo has been ordered.    Renal ultrasound no hydronephrosis.  Urinalysis does not show any evidence of activity.  There appears to be no administration of any nephrotoxins IV contrast or use of ACE inhibitors or angiotensin receptor blockers.  This may be cardiorenal syndrome.  It may also be consistent with significant right-sided heart failure.    This may be progression of his renal disease.  We will continue to follow.  We will check some vein mapping.  We will hold off on vascular surgery consultation until we have a better handle on her renal function.  As it does appear to be acute on chronic changes  Anemia from iron deficiency will give IV iron  Volume/hypertension patient  is being placed on IV Lasix 40 mg every 12 hours we will see how he diuresis.  The dose may need to be increased.  Closely follow I's and O's.  Asbestosis has seen pulmonology in the past.  Has got significant obstructive and restrictive lung disease.  Congestive heart failure we will continue diuretics at this point.  EF 50-55% percent with RV dysfunction    LOS: 1 Sherril Croon '@TODAY''@10'$ :43 AM

## 2020-04-12 NOTE — Progress Notes (Signed)
PROGRESS NOTE  Peter Oneill [id number] DOB: [birth year] DOA: 04/10/2020 PCP: [name], MD  Brief History   The patient is a 80 yr old man who presented to [facility] with complaints of increasing lower extremity edema and shortness of breath. The patient was asked to come to the ED after a visit to the hypertension clinic. The patient had had a change in medications at the end of January. In the last week he had doubled the dose of lasix he was taken. He has gained about 25 lbs since January.   He carries a past medical history significant for CAD s/p stenting for CABG, asbestosis, hypertension, CKD IIIa, hypertension, and OSA.  In the ED he was found to have a creatinine of 4.23 with a GFR of 14. The patient's baseline creatinine is 2.6. He had a metabolic acidosis with a bicarbonate of 19.  BNP is elevated at 736.9. CXR demonstrated no acute cardiopulmonary findings.  [facility] were consulted to admit the patient for further evaluation and treatment. The patient was admitted to a telemetry bed by my colleague, Dr. Hal Hope early this morning. Cardiology and nephrology have been consulted. He is receiving IV lasix. His I's and O's are being carefully monitored.  Cardiology feels that the patient's volume oveload is due to to renal insufficiency. Pt had mobitz II, type 1 AVB overnight. Transient. The patient is asymptomatic.   Nephrology and cardiology feel that the patient's elevated creatinine is due to a progression of intrinsic renal disease. Anemia due to iron deficiency. Feraheme given.  Vein mapping today. Consultants  . Nephrology . Cardiology  Procedures  . None  Antibiotics   Anti-infectives (From admission, onward)   None     Subjective  The patient is resting comfortably. No new complaints.  Objective   Vitals:  Vitals:   04/12/20 0800 04/12/20 0955  BP: (!) 156/66 (!) 154/68  Pulse: 88 90  Resp: 18 18  Temp: 98.1 F (36.7 C) 98 F (36.7 C)   SpO2:      Exam:  Constitutional:  . The patient is awake, alert, and oriented x 3. No acute distress. Respiratory:  . No increased work of breathing. . No wheezes, rales, or rhonchi . No tactile fremitus Cardiovascular:  . Regular rate and rhythm . No murmurs, ectopy, or gallups. . No lateral PMI. No thrills. Abdomen:  . Abdomen is soft, non-tender, non-distended . No hernias, masses, or organomegaly . Normoactive bowel sounds.  Musculoskeletal:  . No cyanosis or clubbing . Bilateral lower extremity edema Skin:  . No rashes, lesions, ulcers . palpation of skin: no induration or nodules Neurologic:  . CN 2-12 intact . Sensation all 4 extremities intact Psychiatric:  . Mental status o Mood, affect appropriate o Orientation to person, place, time  . judgment and insight appear intact  I have personally reviewed the following:   Today's Data  . Vitals, BMP, Iron studies  Imaging  . CXR  Cardiology Data  . Echocardiogram is pending.  Scheduled Meds: . amLODipine  10 mg Oral Daily  . aspirin EC  81 mg Oral Daily  . carvedilol  3.125 mg Oral BID  . clopidogrel  75 mg Oral Daily  . ezetimibe  10 mg Oral Daily  . furosemide  40 mg Intravenous BID  . heparin  5,000 Units Subcutaneous Q8H  . latanoprost  1 drop Both Eyes QHS  . omega-3 acid ethyl esters  1 g Oral Daily  . rosuvastatin  10 mg Oral Daily  .  umeclidinium-vilanterol  1 puff Inhalation Daily   Principal Problem:   Acute CHF (congestive heart failure) (HCC) Active Problems:   Coronary artery disease   Hyperlipidemia   Asbestosis ([place])   Hypertension   ARF (acute renal failure) ([place])   LOS: 1 day   A & P   Acute Systolic CHF:  Echo performed prior to CABG in 2016 demonstrated an EF of 60%. 2D echo is p ending. Patient is receiving 40 IV every 12 hours. Will monitor volume status, renal status and electrolytes. Cardiology consulted. Must monitor closely for worsening.   Acute on chronic  kidney disease stage III. It seems that the patient's baseline creatinine is 2.61 and corresponds to a CKD IIIa. Creatinine this am is 4.44. Nephrology has been consulted. Will follow creatinine, electrolytes, and volume status. Avoid nephrotoxic substances and hypotension. Must monitor closely for worsening.  Proteinuria: Noted. Nephrology consulted.   Second degree AV block: Unable to determine if this is Mobitz 1 or 2 as the p wave is difficult to see as it is frequntly buried in ST segment. Cardiology consulted. Continue to monitor on telemetry.   CAD status post CABG and stenting denies any chest pain. On aspirin statins antiplatelet agents and beta-blockers. Cardiology consulted.  Essential Hypertension: Pt is on amlodipine 10 mg daily, and carvidilol 6.25 at home. He has been continued on amlodipine 10 mg daily and carvedilol 3.125 mg bid. Monitor.  History of asbestosis being followed by pulmonologist. Pt is not on O2 at home.  I have seen and examined this patient myself. I have spent  37 minutes in he evaluation and care.  DVT prophylaxis: Heparin. Code Status: Full code. Family Communication: Discussed with patient. Disposition Plan: Home.  Marquan Vokes, DO [facility] Direct contact: see [email or web address]  7PM-7AM contact night coverage as above 04/12/2020, 3:08 PM  LOS: 0 days

## 2020-04-12 NOTE — Progress Notes (Signed)
  Mobility Specialist Criteria Algorithm Info.   Mobility Team: HOB elevated: Activity: Ambulated in hall; Dangled on edge of bed Range of motion: Active; All extremities Level of assistance: Contact guard assist, steadying assist Assistive device: Front wheel walker Minutes sitting in chair:  Minutes stood: 5 minutes Minutes ambulated: 5 minutes Distance ambulated (ft): 100 ft Mobility response: Tolerated well Bed Position: Semi-fowlers  Patient agreed to participate in mobility this morning. Explained that he's independent and uses cane at home but feel a walker would be a better option. He got to the EOB independently and donned socks and shoes with very little assistance. Stood and ambulated in hallway 100 feet at min guard with RW. Patient ambulates with flexed trunk posture due to pre existing back problems per pt. Required standing rest break x1, also needed cues to correct posture as he fatigues with exertion. Tolerated ambulation well without incident and is now dangling EOB with all needs met.   04/12/2020 11:45 AM

## 2020-04-12 NOTE — Progress Notes (Signed)
DAILY PROGRESS NOTE   Patient Name: Peter Oneill Date of Encounter: 04/12/2020 Cardiologist: Larae Grooms, MD  Chief Complaint   Feels the same, no chest pain   Patient Profile   Peter Oneill is a 80 y.o. male with a PMH of CAD s/p CABG in 2003 with repeat CABG in 2016 and subsequent stenting to LCx in 2017, carotid artery disease s/p CEA in 1996, HTN, HLD, CKD stage IV, and OSA, who is being seen today for the evaluation of CHF at the request of Dr. Benny Lennert.  Subjective   Only about 500 cc recorded negative yesterday - Creatinine has stabilized around 4.4 - diuresis per nephrology. Echo yesterday showed LVEF 50-55% with anteroseptal hypokinesis to akinesis.  Short period of Mobitz II, type 1 AVB overnight - asymptomatic - this has bee noted before.  Objective   Vitals:   04/12/20 0459 04/12/20 0505 04/12/20 0800 04/12/20 0955  BP: (!) 154/72  (!) 156/66   Pulse: 94  88   Resp: 19  18   Temp: 97.9 F (36.6 C)  98.1 F (36.7 C) 98 F (36.7 C)  TempSrc: Oral  Oral   SpO2: 95%     Weight:  104.6 kg    Height:        Intake/Output Summary (Last 24 hours) at 04/12/2020 1016 Last data filed at 04/12/2020 [id number] Gross per 24 hour  Intake 710 ml  Output 1000 ml  Net -290 ml   Filed Weights   04/10/20 2350 04/11/20 1751 04/12/20 0505  Weight: 106.1 kg 105.1 kg 104.6 kg    Physical Exam   General appearance: alert and no distress Lungs: clear to auscultation bilaterally Heart: regular rate and rhythm Extremities: edema 2+ bilateral LE edema Neurologic: Grossly normal  Inpatient Medications    Scheduled Meds: . allopurinol  300 mg Oral Daily  . amLODipine  10 mg Oral Daily  . aspirin EC  81 mg Oral Daily  . carvedilol  3.125 mg Oral BID  . clopidogrel  75 mg Oral Daily  . ezetimibe  10 mg Oral Daily  . furosemide  40 mg Intravenous BID  . heparin  5,000 Units Subcutaneous Q8H  . hydrALAZINE  25 mg Oral TID  . latanoprost  1 drop Both Eyes QHS  .  omega-3 acid ethyl esters  1 g Oral Daily  . rosuvastatin  10 mg Oral Daily  . tamsulosin  0.4 mg Oral QPC supper  . umeclidinium-vilanterol  1 puff Inhalation Daily    Continuous Infusions: . ferumoxytol      PRN Meds: acetaminophen **OR** acetaminophen, nitroGLYCERIN   Labs   Results for orders placed or performed during the hospital encounter of 04/10/20 (from the past 48 hour(s))  Basic metabolic panel     Status: Abnormal   Collection Time: 04/10/20  5:35 PM  Result Value Ref Range   Sodium 137 135 - 145 mmol/L   Potassium 4.5 3.5 - 5.1 mmol/L   Chloride 105 98 - 111 mmol/L   CO2 19 (L) 22 - 32 mmol/L   Glucose, Bld 126 (H) 70 - 99 mg/dL    Comment: Glucose reference range applies only to samples taken after fasting for at least 8 hours.   BUN 105 (H) 8 - 23 mg/dL   Creatinine, Ser 4.23 (H) 0.61 - 1.24 mg/dL   Calcium 8.5 (L) 8.9 - 10.3 mg/dL   GFR, Estimated 14 (L) >60 mL/min    Comment: (NOTE) Calculated using the CKD-EPI Creatinine Equation (  2021)    Anion gap 13 5 - 15    Comment: Performed at [facility], [place] [address], [place], [place] [address]  CBC     Status: Abnormal   Collection Time: 04/10/20  5:35 PM  Result Value Ref Range   WBC 7.4 4.0 - 10.5 K/uL   RBC 3.41 (L) 4.22 - 5.81 MIL/uL   Hemoglobin 10.4 (L) 13.0 - 17.0 g/dL   HCT 31.9 (L) 39.0 - 52.0 %   MCV 93.5 80.0 - 100.0 fL   MCH 30.5 26.0 - 34.0 pg   MCHC 32.6 30.0 - 36.0 g/dL   RDW 15.9 (H) 11.5 - 15.5 %   Platelets 171 150 - 400 K/uL   nRBC 0.3 (H) 0.0 - 0.2 %    Comment: Performed at [facility] [address], [place], [place] [address]  Brain natriuretic peptide     Status: Abnormal   Collection Time: 04/10/20  5:35 PM  Result Value Ref Range   B Natriuretic Peptide 736.9 (H) 0.0 - 100.0 pg/mL    Comment: Performed at [facility] [address], [place],  [address]  Resp Panel by RT-PCR (Flu A&B, Covid) Nasopharyngeal Swab     Status: None   Collection  Time: 04/10/20 11:38 PM   Specimen: Nasopharyngeal Swab; Nasopharyngeal(NP) swabs in vial transport medium  Result Value Ref Range   SARS Coronavirus 2 by RT PCR NEGATIVE NEGATIVE    Comment: (NOTE) SARS-CoV-2 target nucleic acids are NOT DETECTED.  The SARS-CoV-2 RNA is generally detectable in upper respiratory specimens during the acute phase of infection. The lowest concentration of SARS-CoV-2 viral copies this assay can detect is 138 copies/mL. A negative result does not preclude SARS-Cov-2 infection and should not be used as the sole basis for treatment or other patient management decisions. A negative result may occur with  improper specimen collection/handling, submission of specimen other than nasopharyngeal swab, presence of viral mutation(s) within the areas targeted by this assay, and inadequate number of viral copies(<138 copies/mL). A negative result must be combined with clinical observations, patient history, and epidemiological information. The expected result is Negative.  Fact Sheet for Patients:  EntrepreneurPulse.com.au  Fact Sheet for Healthcare Providers:  IncredibleEmployment.be  This test is no t yet approved or cleared by the [place] FDA and  has been authorized for detection and/or diagnosis of SARS-CoV-2 by FDA under an Emergency Use Authorization (EUA). This EUA will remain  in effect (meaning this test can be used) for the duration of the COVID-19 declaration under Section 564(b)(1) of the Act, 21 U.S.C.section 360bbb-3(b)(1), unless the authorization is terminated  or revoked sooner.       Influenza A by PCR NEGATIVE NEGATIVE   Influenza B by PCR NEGATIVE NEGATIVE    Comment: (NOTE) The Xpert Xpress SARS-CoV-2/FLU/RSV plus assay is intended as an aid in the diagnosis of influenza from Nasopharyngeal swab specimens and should not be used as a sole basis for treatment. Nasal washings and aspirates are  unacceptable for Xpert Xpress SARS-CoV-2/FLU/RSV testing.  Fact Sheet for Patients: EntrepreneurPulse.com.au  Fact Sheet for Healthcare Providers: IncredibleEmployment.be  This test is not yet approved or cleared by the [place] FDA and has been authorized for detection and/or diagnosis of SARS-CoV-2 by FDA under an Emergency Use Authorization (EUA). This EUA will remain in effect (meaning this test can be used) for the duration of the COVID-19 declaration under Section 564(b)(1) of the Act, 21 U.S.C. section  360bbb-3(b)(1), unless the authorization is terminated or revoked.  Performed at [facility], [place] [address], [place], [place] [address]   Urinalysis, Routine w reflex microscopic     Status: Abnormal   Collection Time: 04/11/20  1:52 AM  Result Value Ref Range   Color, Urine YELLOW YELLOW   APPearance CLEAR CLEAR   Specific Gravity, Urine 1.011 1.005 - 1.030   pH 5.0 5.0 - 8.0   Glucose, UA NEGATIVE NEGATIVE mg/dL   Hgb urine dipstick NEGATIVE NEGATIVE   Bilirubin Urine NEGATIVE NEGATIVE   Ketones, ur NEGATIVE NEGATIVE mg/dL   Protein, ur 100 (A) NEGATIVE mg/dL   Nitrite NEGATIVE NEGATIVE   Leukocytes,Ua NEGATIVE NEGATIVE   RBC / HPF 0-5 0 - 5 RBC/hpf   WBC, UA 0-5 0 - 5 WBC/hpf   Bacteria, UA NONE SEEN NONE SEEN   Squamous Epithelial / LPF 0-5 0 - 5    Comment: Performed at [facility], [place] [address] [place], [place] [address]  TSH     Status: None   Collection Time: 04/11/20  3:30 AM  Result Value Ref Range   TSH 2.059 0.350 - 4.500 uIU/mL    Comment: Performed by a 3rd Generation assay with a functional sensitivity of <=0.01 uIU/mL. Performed at [facility], [place] [address], [place], [place] [address]   Magnesium     Status: Abnormal   Collection Time: 04/11/20  3:30 AM  Result Value Ref Range   Magnesium 2.8 (H) 1.7 - 2.4 mg/dL    Comment: Performed at [facility] [address],  [place], [place] [address]  Comprehensive metabolic panel     Status: Abnormal   Collection Time: 04/11/20  3:30 AM  Result Value Ref Range   Sodium 139 135 - 145 mmol/L   Potassium 4.5 3.5 - 5.1 mmol/L   Chloride 106 98 - 111 mmol/L   CO2 19 (L) 22 - 32 mmol/L   Glucose, Bld 115 (H) 70 - 99 mg/dL    Comment: Glucose reference range applies only to samples taken after fasting for at least 8 hours.   BUN 109 (H) 8 - 23 mg/dL   Creatinine, Ser 4.44 (H) 0.61 - 1.24 mg/dL   Calcium 8.3 (L) 8.9 - 10.3 mg/dL   Total Protein 6.0 (L) 6.5 - 8.1 g/dL   Albumin 3.4 (L) 3.5 - 5.0 g/dL   AST 34 15 - 41 U/L   ALT 23 0 - 44 U/L   Alkaline Phosphatase 66 38 - 126 U/L   Total Bilirubin 1.0 0.3 - 1.2 mg/dL   GFR, Estimated 13 (L) >60 mL/min    Comment: (NOTE) Calculated using the CKD-EPI Creatinine Equation (2021)    Anion gap 14 5 - 15    Comment: Performed at [facility], [place] [address], [place], [place] [address]  CBC     Status: Abnormal   Collection Time: 04/11/20  3:30 AM  Result Value Ref Range   WBC 7.2 4.0 - 10.5 K/uL   RBC 3.22 (L) 4.22 - 5.81 MIL/uL   Hemoglobin 9.7 (L) 13.0 - 17.0 g/dL   HCT 29.7 (L) 39.0 - 52.0 %   MCV 92.2 80.0 - 100.0 fL   MCH 30.1 26.0 - 34.0 pg   MCHC 32.7 30.0 - 36.0 g/dL   RDW 15.8 (H) 11.5 - 15.5 %   Platelets 145 (L) 150 - 400 K/uL   nRBC 0.0 0.0 - 0.2 %  Comment: Performed at [facility], [place] [address], [place], [place] [address]  Iron and TIBC     Status: Abnormal   Collection Time: 04/11/20  5:02 PM  Result Value Ref Range   Iron 32 (L) 45 - 182 ug/dL   TIBC 323 250 - 450 ug/dL   Saturation Ratios 10 (L) 17.9 - 39.5 %   UIBC 291 ug/dL    Comment: Performed at [facility], [place] [address] [place], [place] [address]  Renal function panel     Status: Abnormal   Collection Time: 04/12/20  2:30 AM  Result Value Ref Range   Sodium 138 135 - 145 mmol/L   Potassium 4.1 3.5 - 5.1 mmol/L   Chloride 106 98 - 111 mmol/L   CO2 20 (L) 22  - 32 mmol/L   Glucose, Bld 115 (H) 70 - 99 mg/dL    Comment: Glucose reference range applies only to samples taken after fasting for at least 8 hours.   BUN 110 (H) 8 - 23 mg/dL   Creatinine, Ser 4.38 (H) 0.61 - 1.24 mg/dL   Calcium 8.0 (L) 8.9 - 10.3 mg/dL   Phosphorus 6.1 (H) 2.5 - 4.6 mg/dL   Albumin 2.9 (L) 3.5 - 5.0 g/dL   GFR, Estimated 13 (L) >60 mL/min    Comment: (NOTE) Calculated using the CKD-EPI Creatinine Equation (2021)    Anion gap 12 5 - 15    Comment: Performed at [facility] [address] [place], [place] [address]    ECG   N/A  Telemetry   Sinus rhythm with brief episode of Mobitz II, type 1 AVB - Personally Reviewed  Radiology    DG Chest 2 View  Result Date: 04/10/2020 CLINICAL DATA:  Short of breath.  Lower extremity edema EXAM: CHEST - 2 VIEW COMPARISON:  01/18/2019 FINDINGS: Sternotomy wires overlie normal cardiac silhouette. There is chronic pleural thickening with calcifications and basilar scarring. No focal consolidation. No pulmonary edema. No pneumothorax. No acute osseous abnormality. IMPRESSION: 1. No clear acute cardiopulmonary findings. 2. Chronic pleural thickening and scarring at the lung bases. Electronically Signed   By: Suzy Bouchard M.D.   On: 04/10/2020 18:29   US RENAL  Result Date: 04/11/2020 CLINICAL DATA:  Acute kidney failure. EXAM: RENAL / URINARY TRACT ULTRASOUND COMPLETE COMPARISON:  October 14, 2018. FINDINGS: Right Kidney: Renal measurements: 10.4 x 4.5 x 4.4 cm = volume: 108 mL. Increased echogenicity of renal parenchyma is noted suggesting medical renal disease. Cortical thinning is noted. No mass or hydronephrosis visualized. Left Kidney: Renal measurements: 10.8 x 4.4 x 4.8 cm = volume: 146 mL. Increased echogenicity of renal parenchyma is noted. Cortical thinning is noted. No mass or hydronephrosis visualized. Bladder: Not well visualized as patient recently voided. Other: Mild ascites is noted. IMPRESSION: Increased  echogenicity of renal parenchyma is noted suggesting medical renal disease. No hydronephrosis or renal obstruction is noted. Bilateral renal cortical thinning is noted as well. Mild ascites is noted Electronically Signed   By: Marijo Conception M.D.   On: 04/11/2020 11:42   ECHOCARDIOGRAM COMPLETE  Result Date: 04/11/2020    ECHOCARDIOGRAM REPORT   Patient Name:   Peter Oneill Date of Exam: 04/11/2020 Medical Rec #:  [id number]        Height:       69.0 in Accession #:    [id number]       Weight:       234.0 lb Date of  Birth:  1941-01-03         BSA:          2.209 m Patient Age:    66 years         BP:           139/81 mmHg Patient Gender: M                HR:           77 bpm. Exam Location:  Inpatient Procedure: 2D Echo, Cardiac Doppler, Color Doppler and Intracardiac            Opacification Agent Indications:    I50.33 Acute on chronic diastolic (congestive) heart failure  History:        Patient has prior history of Echocardiogram examinations, most                 recent 01/29/2015. CAD; Risk Factors:Sleep Apnea, Dyslipidemia                 and Hypertension.  Sonographer:    Tiffany Dance Referring Phys: 39 Rise Patience  Sonographer Comments: Technically difficult study due to poor echo windows, no subcostal window and suboptimal parasternal window. IMPRESSIONS  1. Left ventricular ejection fraction, by estimation, is 50 to 55%. The left ventricle has low normal function. The left ventricle demonstrates regional wall motion abnormalities. Anteroseptal hypokinesis. There is mild left ventricular hypertrophy. Left ventricular diastolic parameters are indeterminate.  2. Right ventricular systolic function is mildly reduced. The right ventricular size is mildly enlarged. Tricuspid regurgitation signal is inadequate for assessing PA pressure.  3. The mitral valve is grossly normal. No evidence of mitral valve regurgitation.  4. The aortic valve was not well visualized. Aortic valve regurgitation is not  visualized. No aortic stenosis is present. FINDINGS  Left Ventricle: Left ventricular ejection fraction, by estimation, is 50 to 55%. The left ventricle has low normal function. The left ventricle demonstrates regional wall motion abnormalities. Definity contrast agent was given IV to delineate the left ventricular endocardial borders. The left ventricular internal cavity size was normal in size. There is mild left ventricular hypertrophy. Left ventricular diastolic parameters are indeterminate. Right Ventricle: The right ventricular size is mildly enlarged. Right vetricular wall thickness was not well visualized. Right ventricular systolic function is mildly reduced. Tricuspid regurgitation signal is inadequate for assessing PA pressure. Left Atrium: Left atrial size was normal in size. Right Atrium: Right atrial size was normal in size. Pericardium: There is no evidence of pericardial effusion. Mitral Valve: The mitral valve is grossly normal. No evidence of mitral valve regurgitation. Tricuspid Valve: The tricuspid valve is grossly normal. Tricuspid valve regurgitation is not demonstrated. Aortic Valve: The aortic valve was not well visualized. Aortic valve regurgitation is not visualized. No aortic stenosis is present. Pulmonic Valve: The pulmonic valve was not well visualized. Pulmonic valve regurgitation is not visualized. Aorta: The aortic root and ascending aorta are structurally normal, with no evidence of dilitation. IAS/Shunts: The interatrial septum was not well visualized.  LEFT VENTRICLE PLAX 2D LVIDd:         4.40 cm Diastology LVIDs:         3.60 cm LV e' lateral:   12.10 cm/s LV PW:         1.30 cm LV E/e' lateral: 9.8 LV IVS:        1.00 cm  RIGHT VENTRICLE RV Basal diam:  3.60 cm RV Mid diam:    2.50 cm RV  S prime:     6.64 cm/s TAPSE (M-mode): 1.5 cm LEFT ATRIUM             Index       RIGHT ATRIUM           Index LA diam:        5.60 cm 2.54 cm/m  RA Area:     21.20 cm LA Vol (A2C):   70.4 ml  31.88 ml/m RA Volume:   64.30 ml  29.11 ml/m LA Vol (A4C):   45.4 ml 20.56 ml/m LA Biplane Vol: 56.5 ml 25.58 ml/m  AORTIC VALVE LVOT Vmax:   106.00 cm/s LVOT Vmean:  75.300 cm/s LVOT VTI:    0.242 m  AORTA Ao Asc diam: 3.50 cm MITRAL VALVE MV Area (PHT): 2.69 cm     SHUNTS MV Decel Time: 282 msec     Systemic VTI: 0.24 m MV E velocity: 118.00 cm/s MV A velocity: 140.00 cm/s MV E/A ratio:  0.84 Oswaldo Milian MD Electronically signed by Oswaldo Milian MD Signature Date/Time: 04/11/2020/4:35:19 PM    Final     Cardiac Studies   See echo above  Assessment   1. Principal Problem: 2.   Acute CHF (congestive heart failure) ([place]) 3. Active Problems: 4.   Coronary artery disease 5.   Hyperlipidemia 6.   Asbestosis ([place]) 7.   Hypertension 8.   ARF (acute renal failure) ([place]) 9.   Plan   1. Echo shows low normal LVEF 50-55% - creatinine increased and now stable, suggesting this may be progression of intrinsic renal disease. Urine output has not been great according to the chart. Weight is down <1kg. Given his low GFR - will likely need much higher dose lasix to reach threshold - will defer to nephrology regarding this.  Time Spent Directly with Patient:  I have spent a total of 25 minutes with the patient reviewing hospital notes, telemetry, EKGs, labs and examining the patient as well as establishing an assessment and plan that was discussed personally with the patient.  > 50% of time was spent in direct patient care.  Length of Stay:  LOS: 1 day   Pixie Casino, MD, [facility], [place] Director of the [facility] Lipid Disorders &  Cardiovascular Risk Reduction [facility] Diplomate of the American Board of Clinical Lipidology Attending Cardiologist  Direct Dial: [phone]  Fax: [phone]  Website:  [email or web address] Gatley 04/12/2020, 10:16 AM

## 2020-04-13 DIAGNOSIS — N178 Other acute kidney failure: Secondary | ICD-10-CM | POA: Diagnosis not present

## 2020-04-13 LAB — RENAL FUNCTION PANEL
Albumin: 2.8 g/dL — ABNORMAL LOW (ref 3.5–5.0)
Anion gap: 10 (ref 5–15)
BUN: 111 mg/dL — ABNORMAL HIGH (ref 8–23)
CO2: 21 mmol/L — ABNORMAL LOW (ref 22–32)
Calcium: 8.1 mg/dL — ABNORMAL LOW (ref 8.9–10.3)
Chloride: 106 mmol/L (ref 98–111)
Creatinine, Ser: 4.18 mg/dL — ABNORMAL HIGH (ref 0.61–1.24)
GFR, Estimated: 14 mL/min — ABNORMAL LOW (ref >60)
Glucose, Bld: 96 mg/dL (ref 70–99)
Phosphorus: 5.6 mg/dL — ABNORMAL HIGH (ref 2.5–4.6)
Potassium: 3.6 mmol/L (ref 3.5–5.1)
Sodium: 137 mmol/L (ref 135–145)

## 2020-04-13 LAB — GLUCOSE, CAPILLARY: Glucose-Capillary: 118 mg/dL — ABNORMAL HIGH (ref 70–99)

## 2020-04-13 MED ORDER — FUROSEMIDE 10 MG/ML IJ SOLN
80.0000 mg | Freq: Two times a day (BID) | INTRAMUSCULAR | Status: DC
  Administered 2020-04-13: 80 mg via INTRAVENOUS
  Filled 2020-04-13: qty 8

## 2020-04-13 MED ORDER — FUROSEMIDE 10 MG/ML IJ SOLN
160.0000 mg | Freq: Two times a day (BID) | INTRAVENOUS | Status: DC
  Administered 2020-04-14 (×2): 160 mg via INTRAVENOUS
  Filled 2020-04-13: qty 10
  Filled 2020-04-13 (×4): qty 16

## 2020-04-13 MED ORDER — FUROSEMIDE 10 MG/ML IJ SOLN: 160.0000 mg | Freq: Two times a day (BID) | INTRAVENOUS | Status: DC

## 2020-04-13 MED ORDER — METOLAZONE 2.5 MG PO TABS
2.5000 mg | ORAL_TABLET | Freq: Once | ORAL | Status: AC
  Administered 2020-04-13: 2.5 mg via ORAL
  Filled 2020-04-13: qty 1

## 2020-04-13 MED ORDER — FUROSEMIDE 10 MG/ML IJ SOLN
160.0000 mg | Freq: Four times a day (QID) | INTRAVENOUS | Status: AC
  Administered 2020-04-13 (×2): 160 mg via INTRAVENOUS
  Filled 2020-04-13 (×3): qty 16

## 2020-04-13 MED ORDER — FUROSEMIDE 10 MG/ML IJ SOLN: 80.0000 mg | Freq: Three times a day (TID) | INTRAMUSCULAR | Status: DC

## 2020-04-13 NOTE — Plan of Care (Signed)
°  Problem: Activity: °Goal: Risk for activity intolerance will decrease °Outcome: Progressing °  °Problem: Elimination: °Goal: Will not experience complications related to bowel motility °Outcome: Progressing °Goal: Will not experience complications related to urinary retention °Outcome: Progressing °  °

## 2020-04-13 NOTE — Progress Notes (Signed)
DAILY PROGRESS NOTE   Patient Name: Peter Oneill Date of Encounter: 04/13/2020 Cardiologist: Larae Grooms, MD  Chief Complaint   Feels about the same  Patient Profile   Peter Oneill is a 80 y.o. male with a PMH of CAD s/p CABG in 2003 with repeat CABG in 2016 and subsequent stenting to LCx in 2017, carotid artery disease s/p CEA in 1996, HTN, HLD, CKD stage IV, and OSA, who is being seen today for the evaluation of CHF at the request of Dr. Benny Lennert.  Subjective   Slow improvement in creatinine - workup in progress for possible AV fistula - now net negative 1.5L. On lasix 80 mg IV BID.  Objective   Vitals:   04/12/20 1926 04/12/20 2359 04/13/20 0457 04/13/20 0814  BP: (!) 153/69 (!) 141/55 (!) 118/42 (!) 151/68  Pulse: 72 79 73 85  Resp: '18 18 18 18  '$ Temp: 97.9 F (36.6 C) 97.7 F (36.5 C) 98.2 F (36.8 C) 98.2 F (36.8 C)  TempSrc: Oral Oral Oral Oral  SpO2: 94% 96% 94% 95%  Weight:   104.4 kg   Height:        Intake/Output Summary (Last 24 hours) at 04/13/2020 1050 Last data filed at 04/13/2020 [id number] Gross per 24 hour  Intake 920 ml  Output 1326 ml  Net -406 ml   Filed Weights   04/11/20 1751 04/12/20 0505 04/13/20 0457  Weight: 105.1 kg 104.6 kg 104.4 kg    Physical Exam   General appearance: alert and no distress Lungs: clear to auscultation bilaterally Heart: regular rate and rhythm Extremities: edema 2+ bilateral LE edema Neurologic: Grossly normal  Inpatient Medications    Scheduled Meds: . amLODipine  10 mg Oral Daily  . aspirin EC  81 mg Oral Daily  . carvedilol  3.125 mg Oral BID  . clopidogrel  75 mg Oral Daily  . ezetimibe  10 mg Oral Daily  . furosemide  80 mg Intravenous BID  . heparin  5,000 Units Subcutaneous Q8H  . latanoprost  1 drop Both Eyes QHS  . omega-3 acid ethyl esters  1 g Oral Daily  . rosuvastatin  10 mg Oral Daily  . umeclidinium-vilanterol  1 puff Inhalation Daily    Continuous Infusions: . ferumoxytol       PRN Meds: acetaminophen **OR** acetaminophen, nitroGLYCERIN   Labs   Results for orders placed or performed during the hospital encounter of 04/10/20 (from the past 48 hour(s))  Iron and TIBC     Status: Abnormal   Collection Time: 04/11/20  5:02 PM  Result Value Ref Range   Iron 32 (L) 45 - 182 ug/dL   TIBC 323 250 - 450 ug/dL   Saturation Ratios 10 (L) 17.9 - 39.5 %   UIBC 291 ug/dL    Comment: Performed at [facility] [address], [place], [place] [address]  Renal function panel     Status: Abnormal   Collection Time: 04/12/20  2:30 AM  Result Value Ref Range   Sodium 138 135 - 145 mmol/L   Potassium 4.1 3.5 - 5.1 mmol/L   Chloride 106 98 - 111 mmol/L   CO2 20 (L) 22 - 32 mmol/L   Glucose, Bld 115 (H) 70 - 99 mg/dL    Comment: Glucose reference range applies only to samples taken after fasting for at least 8 hours.   BUN 110 (H) 8 - 23 mg/dL   Creatinine, Ser 4.38 (H) 0.61 - 1.24 mg/dL  Calcium 8.0 (L) 8.9 - 10.3 mg/dL   Phosphorus 6.1 (H) 2.5 - 4.6 mg/dL   Albumin 2.9 (L) 3.5 - 5.0 g/dL   GFR, Estimated 13 (L) >60 mL/min    Comment: (NOTE) Calculated using the CKD-EPI Creatinine Equation (2021)    Anion gap 12 5 - 15    Comment: Performed at [facility] [address] [place], [place] [address]  Sodium, urine, random     Status: None   Collection Time: 04/12/20  7:08 PM  Result Value Ref Range   Sodium, Ur 27 mmol/L    Comment: Performed at [facility] [address], [place], [place] [address]  Ferritin     Status: None   Collection Time: 04/12/20  8:07 PM  Result Value Ref Range   Ferritin 82 24 - 336 ng/mL    Comment: Performed at [facility] [address], [place], [place] [address]  Renal function panel     Status: Abnormal   Collection Time: 04/13/20  3:01 AM  Result Value Ref Range   Sodium 137 135 - 145 mmol/L   Potassium 3.6 3.5 - 5.1 mmol/L   Chloride 106 98 - 111 mmol/L   CO2 21 (L) 22 - 32 mmol/L   Glucose,  Bld 96 70 - 99 mg/dL    Comment: Glucose reference range applies only to samples taken after fasting for at least 8 hours.   BUN 111 (H) 8 - 23 mg/dL   Creatinine, Ser 4.18 (H) 0.61 - 1.24 mg/dL   Calcium 8.1 (L) 8.9 - 10.3 mg/dL   Phosphorus 5.6 (H) 2.5 - 4.6 mg/dL   Albumin 2.8 (L) 3.5 - 5.0 g/dL   GFR, Estimated 14 (L) >60 mL/min    Comment: (NOTE) Calculated using the CKD-EPI Creatinine Equation (2021)    Anion gap 10 5 - 15    Comment: Performed at [facility] [address] [place], [place] [address]    ECG   N/A  Telemetry   Sinus rhythm with brief episode of Mobitz II, type 1 AVB - Personally Reviewed  Radiology    US RENAL  Result Date: 04/11/2020 CLINICAL DATA:  Acute kidney failure. EXAM: RENAL / URINARY TRACT ULTRASOUND COMPLETE COMPARISON:  October 14, 2018. FINDINGS: Right Kidney: Renal measurements: 10.4 x 4.5 x 4.4 cm = volume: 108 mL. Increased echogenicity of renal parenchyma is noted suggesting medical renal disease. Cortical thinning is noted. No mass or hydronephrosis visualized. Left Kidney: Renal measurements: 10.8 x 4.4 x 4.8 cm = volume: 146 mL. Increased echogenicity of renal parenchyma is noted. Cortical thinning is noted. No mass or hydronephrosis visualized. Bladder: Not well visualized as patient recently voided. Other: Mild ascites is noted. IMPRESSION: Increased echogenicity of renal parenchyma is noted suggesting medical renal disease. No hydronephrosis or renal obstruction is noted. Bilateral renal cortical thinning is noted as well. Mild ascites is noted Electronically Signed   By: Marijo Conception M.D.   On: 04/11/2020 11:42   ECHOCARDIOGRAM COMPLETE  Result Date: 04/11/2020    ECHOCARDIOGRAM REPORT   Patient Name:   Peter Oneill Date of Exam: 04/11/2020 Medical Rec #:  [id number]        Height:       69.0 in Accession #:    [id number]       Weight:       234.0 lb Date of Birth:  [birth year]         BSA:  2.209 m Patient Age:    77  years         BP:           139/81 mmHg Patient Gender: M                HR:           77 bpm. Exam Location:  Inpatient Procedure: 2D Echo, Cardiac Doppler, Color Doppler and Intracardiac            Opacification Agent Indications:    I50.33 Acute on chronic diastolic (congestive) heart failure  History:        Patient has prior history of Echocardiogram examinations, most                 recent 01/29/2015. CAD; Risk Factors:Sleep Apnea, Dyslipidemia                 and Hypertension.  Sonographer:    Tiffany Dance Referring Phys: 44 Rise Patience  Sonographer Comments: Technically difficult study due to poor echo windows, no subcostal window and suboptimal parasternal window. IMPRESSIONS  1. Left ventricular ejection fraction, by estimation, is 50 to 55%. The left ventricle has low normal function. The left ventricle demonstrates regional wall motion abnormalities. Anteroseptal hypokinesis. There is mild left ventricular hypertrophy. Left ventricular diastolic parameters are indeterminate.  2. Right ventricular systolic function is mildly reduced. The right ventricular size is mildly enlarged. Tricuspid regurgitation signal is inadequate for assessing PA pressure.  3. The mitral valve is grossly normal. No evidence of mitral valve regurgitation.  4. The aortic valve was not well visualized. Aortic valve regurgitation is not visualized. No aortic stenosis is present. FINDINGS  Left Ventricle: Left ventricular ejection fraction, by estimation, is 50 to 55%. The left ventricle has low normal function. The left ventricle demonstrates regional wall motion abnormalities. Definity contrast agent was given IV to delineate the left ventricular endocardial borders. The left ventricular internal cavity size was normal in size. There is mild left ventricular hypertrophy. Left ventricular diastolic parameters are indeterminate. Right Ventricle: The right ventricular size is mildly enlarged. Right vetricular wall  thickness was not well visualized. Right ventricular systolic function is mildly reduced. Tricuspid regurgitation signal is inadequate for assessing PA pressure. Left Atrium: Left atrial size was normal in size. Right Atrium: Right atrial size was normal in size. Pericardium: There is no evidence of pericardial effusion. Mitral Valve: The mitral valve is grossly normal. No evidence of mitral valve regurgitation. Tricuspid Valve: The tricuspid valve is grossly normal. Tricuspid valve regurgitation is not demonstrated. Aortic Valve: The aortic valve was not well visualized. Aortic valve regurgitation is not visualized. No aortic stenosis is present. Pulmonic Valve: The pulmonic valve was not well visualized. Pulmonic valve regurgitation is not visualized. Aorta: The aortic root and ascending aorta are structurally normal, with no evidence of dilitation. IAS/Shunts: The interatrial septum was not well visualized.  LEFT VENTRICLE PLAX 2D LVIDd:         4.40 cm Diastology LVIDs:         3.60 cm LV e' lateral:   12.10 cm/s LV PW:         1.30 cm LV E/e' lateral: 9.8 LV IVS:        1.00 cm  RIGHT VENTRICLE RV Basal diam:  3.60 cm RV Mid diam:    2.50 cm RV S prime:     6.64 cm/s TAPSE (M-mode): 1.5 cm LEFT ATRIUM  Index       RIGHT ATRIUM           Index LA diam:        5.60 cm 2.54 cm/m  RA Area:     21.20 cm LA Vol (A2C):   70.4 ml 31.88 ml/m RA Volume:   64.30 ml  29.11 ml/m LA Vol (A4C):   45.4 ml 20.56 ml/m LA Biplane Vol: 56.5 ml 25.58 ml/m  AORTIC VALVE LVOT Vmax:   106.00 cm/s LVOT Vmean:  75.300 cm/s LVOT VTI:    0.242 m  AORTA Ao Asc diam: 3.50 cm MITRAL VALVE MV Area (PHT): 2.69 cm     SHUNTS MV Decel Time: 282 msec     Systemic VTI: 0.24 m MV E velocity: 118.00 cm/s MV A velocity: 140.00 cm/s MV E/A ratio:  0.84 Oswaldo Milian MD Electronically signed by Oswaldo Milian MD Signature Date/Time: 04/11/2020/4:35:19 PM    Final    VAS [place] UPPER EXT VEIN MAPPING (PRE-OP AVF)  Result  Date: 04/12/2020 UPPER EXTREMITY VEIN MAPPING  Indications: Pre-access. Comparison Study: No previous Performing Technologist: Vonzell Schlatter RVT  Examination Guidelines: A complete evaluation includes B-mode imaging, spectral Doppler, color Doppler, and power Doppler as needed of all accessible portions of each vessel. Bilateral testing is considered an integral part of a complete examination. Limited examinations for reoccurring indications may be performed as noted. +-----------------+-------------+----------+--------+ Right Cephalic   Diameter (cm)Depth (cm)Findings +-----------------+-------------+----------+--------+ Shoulder             0.38        1.20            +-----------------+-------------+----------+--------+ Prox upper arm       0.41        0.64            +-----------------+-------------+----------+--------+ Mid upper arm        0.45        0.35            +-----------------+-------------+----------+--------+ Dist upper arm       0.25        0.45            +-----------------+-------------+----------+--------+ Antecubital fossa    0.44        0.15   thrombus +-----------------+-------------+----------+--------+ Prox forearm         0.40        0.41            +-----------------+-------------+----------+--------+ Mid forearm          0.34        0.37            +-----------------+-------------+----------+--------+ Dist forearm         0.42        0.42            +-----------------+-------------+----------+--------+ Wrist                0.20        0.50            +-----------------+-------------+----------+--------+ +-----------------+-------------+----------+--------+ Left Cephalic    Diameter (cm)Depth (cm)Findings +-----------------+-------------+----------+--------+ Shoulder             0.40        0.33            +-----------------+-------------+----------+--------+ Prox upper arm       0.40        0.33             +-----------------+-------------+----------+--------+ Mid upper arm  0.51        0.19            +-----------------+-------------+----------+--------+ Dist upper arm       0.42        0.17            +-----------------+-------------+----------+--------+ Antecubital fossa    0.51        0.21            +-----------------+-------------+----------+--------+ Prox forearm         0.26        0.47            +-----------------+-------------+----------+--------+ Mid forearm          0.25        0.38            +-----------------+-------------+----------+--------+ Dist forearm         0.23        0.31            +-----------------+-------------+----------+--------+ Wrist                0.20        0.36            +-----------------+-------------+----------+--------+ +-----------------+-------------+----------+---------+ Left Basilic     Diameter (cm)Depth (cm)Findings  +-----------------+-------------+----------+---------+ Prox upper arm       0.43        0.95             +-----------------+-------------+----------+---------+ Mid upper arm        0.44        0.62             +-----------------+-------------+----------+---------+ Dist upper arm       0.44        0.49             +-----------------+-------------+----------+---------+ Antecubital fossa    0.36        0.37   branching +-----------------+-------------+----------+---------+ Prox forearm         0.26        0.22             +-----------------+-------------+----------+---------+ Mid forearm          0.23        0.20             +-----------------+-------------+----------+---------+ Distal forearm       0.15        0.25             +-----------------+-------------+----------+---------+ *See table(s) above for measurements and observations.  Diagnosing physician: Monica Martinez MD Electronically signed by Monica Martinez MD on 04/12/2020 at 4:33:44 PM.    Final     Cardiac Studies    See echo above  Assessment   Principal Problem:   Acute CHF (congestive heart failure) ([place]) Active Problems:   Coronary artery disease   Hyperlipidemia   Asbestosis ([place])   Hypertension   ARF (acute renal failure) ([place])   Plan   1. Echo shows low normal LVEF 50-55% - creatinine is slowing improving. Better urine output on lasix 80 mg IV BID. Would continue.   Time Spent Directly with Patient:  I have spent a total of 25 minutes with the patient reviewing hospital notes, telemetry, EKGs, labs and examining the patient as well as establishing an assessment and plan that was discussed personally with the patient.  > 50% of time was spent in direct patient care.  Length of Stay:  LOS: 2 days   Chrissie Noa C.  Debara Pickett, MD, [facility], [place] Director of the Advanced Lipid Disorders &  Cardiovascular Risk Reduction Clinic Diplomate of the American Board of Clinical Lipidology Attending Cardiologist  Direct Dial: [phone]  Fax: [phone]  Website:  [email or web address] [email or web address] Jezelle Gullick 04/13/2020, 10:50 AM

## 2020-04-13 NOTE — Plan of Care (Signed)
  Problem: Pain Managment: Goal: General experience of comfort will improve Outcome: Completed/Met   Problem: Coping: Goal: Level of anxiety will decrease Outcome: Completed/Met   Problem: Nutrition: Goal: Adequate nutrition will be maintained Outcome: Completed/Met

## 2020-04-13 NOTE — Progress Notes (Signed)
PROGRESS NOTE  Peter Oneill [id number] DOB: [birth year] DOA: 04/10/2020 PCP: [name], MD  Brief History   The patient is a 80 yr old man who presented to [facility] with complaints of increasing lower extremity edema and shortness of breath. The patient was asked to come to the ED after a visit to the hypertension clinic. The patient had had a change in medications at the end of January. In the last week he had doubled the dose of lasix he was taken. He has gained about 25 lbs since January.   He carries a past medical history significant for CAD s/p stenting for CABG, asbestosis, hypertension, CKD IIIa, hypertension, and OSA.  In the ED he was found to have a creatinine of 4.23 with a GFR of 14. The patient's baseline creatinine is 2.6. He had a metabolic acidosis with a bicarbonate of 19.  BNP is elevated at 736.9. CXR demonstrated no acute cardiopulmonary findings.  [facility] were consulted to admit the patient for further evaluation and treatment. The patient was admitted to a telemetry bed by my colleague, Dr. Hal Hope early this morning. Cardiology and nephrology have been consulted. He is receiving IV lasix. His I's and O's are being carefully monitored.  Cardiology feels that the patient's volume oveload is due to to renal insufficiency. Pt had mobitz II, type 1 AVB overnight. Transient. The patient is asymptomatic.   Nephrology and cardiology feel that the patient's elevated creatinine is due to a progression of intrinsic renal disease. Anemia due to iron deficiency. Feraheme given.  Vein mapping was performed on 04/12/2020.  Lasix increased to 80 mg bid due to inadequate response to diuresis on lower dose of lasix. Monitor.  Consultants  . Nephrology . Cardiology  Procedures  . None  Antibiotics   Anti-infectives (From admission, onward)   None     Subjective  The patient is resting comfortably. No new complaints.  Objective   Vitals:  Vitals:    04/13/20 0814 04/13/20 1215  BP: (!) 151/68 (!) 144/70  Pulse: 85 79  Resp: 18 18  Temp: 98.2 F (36.8 C) (!) 97.5 F (36.4 C)  SpO2: 95% 99%   Exam:  Constitutional:  . The patient is awake, alert, and oriented x 3. No acute distress. Respiratory:  . No increased work of breathing. . No wheezes, rales, or rhonchi . No tactile fremitus Cardiovascular:  . Regular rate and rhythm . No murmurs, ectopy, or gallups. . No lateral PMI. No thrills. Abdomen:  . Abdomen is soft, non-tender, non-distended . No hernias, masses, or organomegaly . Normoactive bowel sounds.  Musculoskeletal:  . No cyanosis or clubbing . Bilateral lower extremity edema Skin:  . No rashes, lesions, ulcers . palpation of skin: no induration or nodules Neurologic:  . CN 2-12 intact . Sensation all 4 extremities intact Psychiatric:  . Mental status o Mood, affect appropriate o Orientation to person, place, time  . judgment and insight appear intact  I have personally reviewed the following:   Today's Data  . Vitals, BMP  Imaging  . CXR  Cardiology Data  . Echocardiogram is pending.  Scheduled Meds: . amLODipine  10 mg Oral Daily  . aspirin EC  81 mg Oral Daily  . carvedilol  3.125 mg Oral BID  . clopidogrel  75 mg Oral Daily  . ezetimibe  10 mg Oral Daily  . heparin  5,000 Units Subcutaneous Q8H  . latanoprost  1 drop Both Eyes QHS  . omega-3 acid ethyl esters  1 g Oral Daily  . rosuvastatin  10 mg Oral Daily  . umeclidinium-vilanterol  1 puff Inhalation Daily   Principal Problem:   Acute CHF (congestive heart failure) ([facility]) Active Problems:   Coronary artery disease   Hyperlipidemia   Asbestosis ([place])   Hypertension   ARF (acute renal failure) ([place])   LOS: 2 days   A & P   Acute Systolic CHF:  Echo performed prior to CABG in 2016 demonstrated an EF of 60%. 2D echo is p ending. Patient is receiving 40 IV every 12 hours. Will monitor volume status, renal status and electrolytes.  Cardiology consulted. Must monitor closely for worsening.  Acute on chronic kidney disease stage III. It seems that the patient's baseline creatinine is 2.61 and corresponds to a CKD IIIa. Creatinine this am is 4.44. Nephrology has been consulted. Will follow creatinine, electrolytes, and volume status. Avoid nephrotoxic substances and hypotension. Lasix increased to 80 mg IV bid. Must monitor closely for worsening.  Proteinuria: Noted. Nephrology consulted.   Second degree AV block: Unable to determine if this is Mobitz 1 or 2 as the p wave is difficult to see as it is frequntly buried in ST segment. Cardiology consulted. Continue to monitor on telemetry.   CAD status post CABG and stenting denies any chest pain. On aspirin statins antiplatelet agents and beta-blockers. Cardiology consulted.  Essential Hypertension: Pt is on amlodipine 10 mg daily, and carvidilol 6.25 at home. He has been continued on amlodipine 10 mg daily and carvedilol 3.125 mg bid. Monitor.  History of asbestosis being followed by pulmonologist. Pt is not on O2 at home.  I have seen and examined this patient myself. I have spent 32 minutes in he evaluation and care.  DVT prophylaxis: Heparin. Code Status: Full code. Family Communication: Discussed with patient. Disposition Plan: Home.  Flay Ghosh, DO [facility] Direct contact: see [email or web address]  7PM-7AM contact night coverage as above 04/13/2020, 2:24 PM  LOS: 0 days

## 2020-04-13 NOTE — Progress Notes (Signed)
Heart Failure Navigator Progress Note  Assessed for Heart & Vascular TOC clinic readiness.  Unfortunately at this time the patient does not meet criteria due to SCr 4.18, if renal function improves will reevaluate.   Navigator available for reassessment of patient.   Pricilla Holm, RN, BSN Heart Failure Nurse Navigator [phone]

## 2020-04-13 NOTE — Progress Notes (Signed)
[facility] ROUNDING NOTE   Subjective:   Brief History: 80 year old gentleman who has a history of coronary artery disease status post CABG, also has a history of chronic asbestosis, history of hypertension presents to the emergency room with increasing lower extremity edema and shortness of breath.  His baseline creatinine is about 2.5 mg/dL he was last evaluated in December 2021.  There have been very little change in his renal function. He is asked to present to the emergency room due to the above and labs that showed an increase in creatinine to 4.2 mg/dL.  Urine study shows 0-5 WBCs 0-5 RBCs per high-powered field.  Last renal ultrasound was performed in August 2020 that was essentially unremarkable.   Interval History: minimal change in UOP at this time. Crt essentially unchanged. Continues to feel overloaded today.  Objective:  Vital signs in last 24 hours:  Temp:  [97.7 F (36.5 C)-98.2 F (36.8 C)] 98.2 F (36.8 C) (02/18 0814) Pulse Rate:  [72-90] 85 (02/18 0814) Resp:  [16-18] 18 (02/18 0814) BP: (118-154)/(42-69) 151/68 (02/18 0814) SpO2:  [94 %-96 %] 95 % (02/18 0814) Weight:  [104.4 kg] 104.4 kg (02/18 0457)  Weight change: -0.68 kg Filed Weights   04/11/20 1751 04/12/20 0505 04/13/20 0457  Weight: 105.1 kg 104.6 kg 104.4 kg    Intake/Output: I/O last 3 completed shifts: In: 70 [P.O.:970; IV Piggyback:100] Out: 2126 [Urine:2125; Stool:1]   Intake/Output this shift:  Total I/O In: 360 [P.O.:360] Out: -   GEN: wdwn, sitting in bed, nad ENT: no nasal discharge, mmm EYES: no scleral icterus, eomi CV: normal rate, no murmurs PULM: no iwob, bilateral chest rise ABD: NABS, mild distention SKIN: no rashes or jaundice EXT: Trace edema, warm and well perfused    Basic Metabolic Panel: Recent Labs  Lab 04/10/20 1735 04/11/20 0330 04/12/20 0230 04/13/20 0301  NA 137 139 138 137  K 4.5 4.5 4.1 3.6  CL 105 106 106 106  CO2 19* 19* 20* 21*   GLUCOSE 126* 115* 115* 96  BUN 105* 109* 110* 111*  CREATININE 4.23* 4.44* 4.38* 4.18*  CALCIUM 8.5* 8.3* 8.0* 8.1*  MG  --  2.8*  --   --   PHOS  --   --  6.1* 5.6*    Liver Function Tests: Recent Labs  Lab 04/11/20 0330 04/12/20 0230 04/13/20 0301  AST 34  --   --   ALT 23  --   --   ALKPHOS 66  --   --   BILITOT 1.0  --   --   PROT 6.0*  --   --   ALBUMIN 3.4* 2.9* 2.8*   No results for input(s): LIPASE, AMYLASE in the last 168 hours. No results for input(s): AMMONIA in the last 168 hours.  CBC: Recent Labs  Lab 04/10/20 1735 04/11/20 0330  WBC 7.4 7.2  HGB 10.4* 9.7*  HCT 31.9* 29.7*  MCV 93.5 92.2  PLT 171 145*    Cardiac Enzymes: No results for input(s): CKTOTAL, CKMB, CKMBINDEX, TROPONINI in the last 168 hours.  BNP: Invalid input(s): POCBNP  CBG: No results for input(s): GLUCAP in the last 168 hours.  Microbiology: Results for orders placed or performed during the hospital encounter of 04/10/20  Resp Panel by RT-PCR (Flu A&B, Covid) Nasopharyngeal Swab     Status: None   Collection Time: 04/10/20 11:38 PM   Specimen: Nasopharyngeal Swab; Nasopharyngeal(NP) swabs in vial transport medium  Result Value Ref Range Status  SARS Coronavirus 2 by RT PCR NEGATIVE NEGATIVE Final    Comment: (NOTE) SARS-CoV-2 target nucleic acids are NOT DETECTED.  The SARS-CoV-2 RNA is generally detectable in upper respiratory specimens during the acute phase of infection. The lowest concentration of SARS-CoV-2 viral copies this assay can detect is 138 copies/mL. A negative result does not preclude SARS-Cov-2 infection and should not be used as the sole basis for treatment or other patient management decisions. A negative result may occur with  improper specimen collection/handling, submission of specimen other than nasopharyngeal swab, presence of viral mutation(s) within the areas targeted by this assay, and inadequate number of viral copies(<138 copies/mL). A  negative result must be combined with clinical observations, patient history, and epidemiological information. The expected result is Negative.  Fact Sheet for Patients:  EntrepreneurPulse.com.au  Fact Sheet for Healthcare Providers:  IncredibleEmployment.be  This test is no t yet approved or cleared by the [place] FDA and  has been authorized for detection and/or diagnosis of SARS-CoV-2 by FDA under an Emergency Use Authorization (EUA). This EUA will remain  in effect (meaning this test can be used) for the duration of the COVID-19 declaration under Section 564(b)(1) of the Act, 21 U.S.C.section 360bbb-3(b)(1), unless the authorization is terminated  or revoked sooner.       Influenza A by PCR NEGATIVE NEGATIVE Final   Influenza B by PCR NEGATIVE NEGATIVE Final    Comment: (NOTE) The Xpert Xpress SARS-CoV-2/FLU/RSV plus assay is intended as an aid in the diagnosis of influenza from Nasopharyngeal swab specimens and should not be used as a sole basis for treatment. Nasal washings and aspirates are unacceptable for Xpert Xpress SARS-CoV-2/FLU/RSV testing.  Fact Sheet for Patients: EntrepreneurPulse.com.au  Fact Sheet for Healthcare Providers: IncredibleEmployment.be  This test is not yet approved or cleared by the [place] FDA and has been authorized for detection and/or diagnosis of SARS-CoV-2 by FDA under an Emergency Use Authorization (EUA). This EUA will remain in effect (meaning this test can be used) for the duration of the COVID-19 declaration under Section 564(b)(1) of the Act, 21 U.S.C. section 360bbb-3(b)(1), unless the authorization is terminated or revoked.  Performed at [facility], [place] [address], [place],  [address]     Coagulation Studies: No results for input(s): LABPROT, INR in the last 72 hours.  Urinalysis: Recent Labs    04/11/20 0152  COLORURINE  YELLOW  LABSPEC 1.011  PHURINE 5.0  GLUCOSEU NEGATIVE  HGBUR NEGATIVE  BILIRUBINUR NEGATIVE  KETONESUR NEGATIVE  PROTEINUR 100*  NITRITE NEGATIVE  LEUKOCYTESUR NEGATIVE      Imaging: US RENAL  Result Date: 04/11/2020 CLINICAL DATA:  Acute kidney failure. EXAM: RENAL / URINARY TRACT ULTRASOUND COMPLETE COMPARISON:  October 14, 2018. FINDINGS: Right Kidney: Renal measurements: 10.4 x 4.5 x 4.4 cm = volume: 108 mL. Increased echogenicity of renal parenchyma is noted suggesting medical renal disease. Cortical thinning is noted. No mass or hydronephrosis visualized. Left Kidney: Renal measurements: 10.8 x 4.4 x 4.8 cm = volume: 146 mL. Increased echogenicity of renal parenchyma is noted. Cortical thinning is noted. No mass or hydronephrosis visualized. Bladder: Not well visualized as patient recently voided. Other: Mild ascites is noted. IMPRESSION: Increased echogenicity of renal parenchyma is noted suggesting medical renal disease. No hydronephrosis or renal obstruction is noted. Bilateral renal cortical thinning is noted as well. Mild ascites is noted Electronically Signed   By: Marijo Conception M.D.   On: 04/11/2020 11:42   ECHOCARDIOGRAM COMPLETE  Result Date:  04/11/2020    ECHOCARDIOGRAM REPORT   Patient Name:   [facility] Date of Exam: 04/11/2020 Medical Rec #:  [id number]        Height:       69.0 in Accession #:    [id number]       Weight:       234.0 lb Date of Birth:  1940-03-14         BSA:          2.209 m Patient Age:    89 years         BP:           139/81 mmHg Patient Gender: M                HR:           77 bpm. Exam Location:  Inpatient Procedure: 2D Echo, Cardiac Doppler, Color Doppler and Intracardiac            Opacification Agent Indications:    I50.33 Acute on chronic diastolic (congestive) heart failure  History:        Patient has prior history of Echocardiogram examinations, most                 recent 01/29/2015. CAD; Risk Factors:Sleep Apnea, Dyslipidemia                  and Hypertension.  Sonographer:    Tiffany Dance Referring Phys: 18 Rise Patience  Sonographer Comments: Technically difficult study due to poor echo windows, no subcostal window and suboptimal parasternal window. IMPRESSIONS  1. Left ventricular ejection fraction, by estimation, is 50 to 55%. The left ventricle has low normal function. The left ventricle demonstrates regional wall motion abnormalities. Anteroseptal hypokinesis. There is mild left ventricular hypertrophy. Left ventricular diastolic parameters are indeterminate.  2. Right ventricular systolic function is mildly reduced. The right ventricular size is mildly enlarged. Tricuspid regurgitation signal is inadequate for assessing PA pressure.  3. The mitral valve is grossly normal. No evidence of mitral valve regurgitation.  4. The aortic valve was not well visualized. Aortic valve regurgitation is not visualized. No aortic stenosis is present. FINDINGS  Left Ventricle: Left ventricular ejection fraction, by estimation, is 50 to 55%. The left ventricle has low normal function. The left ventricle demonstrates regional wall motion abnormalities. Definity contrast agent was given IV to delineate the left ventricular endocardial borders. The left ventricular internal cavity size was normal in size. There is mild left ventricular hypertrophy. Left ventricular diastolic parameters are indeterminate. Right Ventricle: The right ventricular size is mildly enlarged. Right vetricular wall thickness was not well visualized. Right ventricular systolic function is mildly reduced. Tricuspid regurgitation signal is inadequate for assessing PA pressure. Left Atrium: Left atrial size was normal in size. Right Atrium: Right atrial size was normal in size. Pericardium: There is no evidence of pericardial effusion. Mitral Valve: The mitral valve is grossly normal. No evidence of mitral valve regurgitation. Tricuspid Valve: The tricuspid valve is grossly normal.  Tricuspid valve regurgitation is not demonstrated. Aortic Valve: The aortic valve was not well visualized. Aortic valve regurgitation is not visualized. No aortic stenosis is present. Pulmonic Valve: The pulmonic valve was not well visualized. Pulmonic valve regurgitation is not visualized. Aorta: The aortic root and ascending aorta are structurally normal, with no evidence of dilitation. IAS/Shunts: The interatrial septum was not well visualized.  LEFT VENTRICLE PLAX 2D LVIDd:         4.40 cm Diastology  LVIDs:         3.60 cm LV e' lateral:   12.10 cm/s LV PW:         1.30 cm LV E/e' lateral: 9.8 LV IVS:        1.00 cm  RIGHT VENTRICLE RV Basal diam:  3.60 cm RV Mid diam:    2.50 cm RV S prime:     6.64 cm/s TAPSE (M-mode): 1.5 cm LEFT ATRIUM             Index       RIGHT ATRIUM           Index LA diam:        5.60 cm 2.54 cm/m  RA Area:     21.20 cm LA Vol (A2C):   70.4 ml 31.88 ml/m RA Volume:   64.30 ml  29.11 ml/m LA Vol (A4C):   45.4 ml 20.56 ml/m LA Biplane Vol: 56.5 ml 25.58 ml/m  AORTIC VALVE LVOT Vmax:   106.00 cm/s LVOT Vmean:  75.300 cm/s LVOT VTI:    0.242 m  AORTA Ao Asc diam: 3.50 cm MITRAL VALVE MV Area (PHT): 2.69 cm     SHUNTS MV Decel Time: 282 msec     Systemic VTI: 0.24 m MV E velocity: 118.00 cm/s MV A velocity: 140.00 cm/s MV E/A ratio:  0.84 Oswaldo Milian MD Electronically signed by Oswaldo Milian MD Signature Date/Time: 04/11/2020/4:35:19 PM    Final    VAS [place] UPPER EXT VEIN MAPPING (PRE-OP AVF)  Result Date: 04/12/2020 UPPER EXTREMITY VEIN MAPPING  Indications: Pre-access. Comparison Study: No previous Performing Technologist: Vonzell Schlatter RVT  Examination Guidelines: A complete evaluation includes B-mode imaging, spectral Doppler, color Doppler, and power Doppler as needed of all accessible portions of each vessel. Bilateral testing is considered an integral part of a complete examination. Limited examinations for reoccurring indications may be performed as noted.  +-----------------+-------------+----------+--------+ Right Cephalic   Diameter (cm)Depth (cm)Findings +-----------------+-------------+----------+--------+ Shoulder             0.38        1.20            +-----------------+-------------+----------+--------+ Prox upper arm       0.41        0.64            +-----------------+-------------+----------+--------+ Mid upper arm        0.45        0.35            +-----------------+-------------+----------+--------+ Dist upper arm       0.25        0.45            +-----------------+-------------+----------+--------+ Antecubital fossa    0.44        0.15   thrombus +-----------------+-------------+----------+--------+ Prox forearm         0.40        0.41            +-----------------+-------------+----------+--------+ Mid forearm          0.34        0.37            +-----------------+-------------+----------+--------+ Dist forearm         0.42        0.42            +-----------------+-------------+----------+--------+ Wrist                0.20        0.50            +-----------------+-------------+----------+--------+ +-----------------+-------------+----------+--------+  Left Cephalic    Diameter (cm)Depth (cm)Findings +-----------------+-------------+----------+--------+ Shoulder             0.40        0.33            +-----------------+-------------+----------+--------+ Prox upper arm       0.40        0.33            +-----------------+-------------+----------+--------+ Mid upper arm        0.51        0.19            +-----------------+-------------+----------+--------+ Dist upper arm       0.42        0.17            +-----------------+-------------+----------+--------+ Antecubital fossa    0.51        0.21            +-----------------+-------------+----------+--------+ Prox forearm         0.26        0.47            +-----------------+-------------+----------+--------+ Mid  forearm          0.25        0.38            +-----------------+-------------+----------+--------+ Dist forearm         0.23        0.31            +-----------------+-------------+----------+--------+ Wrist                0.20        0.36            +-----------------+-------------+----------+--------+ +-----------------+-------------+----------+---------+ Left Basilic     Diameter (cm)Depth (cm)Findings  +-----------------+-------------+----------+---------+ Prox upper arm       0.43        0.95             +-----------------+-------------+----------+---------+ Mid upper arm        0.44        0.62             +-----------------+-------------+----------+---------+ Dist upper arm       0.44        0.49             +-----------------+-------------+----------+---------+ Antecubital fossa    0.36        0.37   branching +-----------------+-------------+----------+---------+ Prox forearm         0.26        0.22             +-----------------+-------------+----------+---------+ Mid forearm          0.23        0.20             +-----------------+-------------+----------+---------+ Distal forearm       0.15        0.25             +-----------------+-------------+----------+---------+ *See table(s) above for measurements and observations.  Diagnosing physician: Monica Martinez MD Electronically signed by Monica Martinez MD on 04/12/2020 at 4:33:44 PM.    Final      Medications:   . ferumoxytol     . amLODipine  10 mg Oral Daily  . aspirin EC  81 mg Oral Daily  . carvedilol  3.125 mg Oral BID  . clopidogrel  75 mg Oral Daily  . ezetimibe  10 mg Oral Daily  . furosemide  80 mg Intravenous  BID  . heparin  5,000 Units Subcutaneous Q8H  . latanoprost  1 drop Both Eyes QHS  . omega-3 acid ethyl esters  1 g Oral Daily  . rosuvastatin  10 mg Oral Daily  . umeclidinium-vilanterol  1 puff Inhalation Daily   acetaminophen **OR** acetaminophen,  nitroGLYCERIN  Assessment/ Plan:   Acute on chronic kidney disease: Baseline creatinine 2.5.  Unclear why he had recent rise.  Possible cardiorenal syndrome or progression of his disease   Continue diuretics as below  Monitor kidney function daily as well as ins and outs  Status post vein mapping, hold off on vascular surgery consult  Avoiding nephrotoxic agents  Anemia: Multifactorial with some dilution and iron deficiency.  Status post IV iron.  Diuresis as below  Volume/hypertension: Continues to be volume overloaded.  Likely due to decreased GFR he will require the higher Lasix dose.  80 mg twice daily today and consider up titration based on response  Asbestosis has seen pulmonology in the past.  Has got significant obstructive and restrictive lung disease.  Congestive heart failure we will continue diuretics at this point.  EF 50-55% percent with RV dysfunction.  Cardiology following    LOS: 2 Reesa Chew '@TODAY''@9'$ :07 AM

## 2020-04-14 DIAGNOSIS — I2510 Atherosclerotic heart disease of native coronary artery without angina pectoris: Secondary | ICD-10-CM | POA: Diagnosis not present

## 2020-04-14 DIAGNOSIS — N179 Acute kidney failure, unspecified: Secondary | ICD-10-CM | POA: Diagnosis not present

## 2020-04-14 LAB — CBC WITH DIFFERENTIAL/PLATELET
Abs Immature Granulocytes: 0.07 10*3/uL (ref 0.00–0.07)
Basophils Absolute: 0 10*3/uL (ref 0.0–0.1)
Basophils Relative: 0 %
Eosinophils Absolute: 0.5 10*3/uL (ref 0.0–0.5)
Eosinophils Relative: 7 %
HCT: 27.3 % — ABNORMAL LOW (ref 39.0–52.0)
Hemoglobin: 9.6 g/dL — ABNORMAL LOW (ref 13.0–17.0)
Immature Granulocytes: 1 %
Lymphocytes Relative: 10 %
Lymphs Abs: 0.7 10*3/uL (ref 0.7–4.0)
MCH: 31.5 pg (ref 26.0–34.0)
MCHC: 35.2 g/dL (ref 30.0–36.0)
MCV: 89.5 fL (ref 80.0–100.0)
Monocytes Absolute: 0.7 10*3/uL (ref 0.1–1.0)
Monocytes Relative: 10 %
Neutro Abs: 5.3 10*3/uL (ref 1.7–7.7)
Neutrophils Relative %: 72 %
Platelets: 121 10*3/uL — ABNORMAL LOW (ref 150–400)
RBC: 3.05 MIL/uL — ABNORMAL LOW (ref 4.22–5.81)
RDW: 15.5 % (ref 11.5–15.5)
WBC: 7.3 10*3/uL (ref 4.0–10.5)
nRBC: 0.3 % — ABNORMAL HIGH (ref 0.0–0.2)

## 2020-04-14 LAB — RENAL FUNCTION PANEL
Albumin: 2.9 g/dL — ABNORMAL LOW (ref 3.5–5.0)
Anion gap: 16 — ABNORMAL HIGH (ref 5–15)
BUN: 111 mg/dL — ABNORMAL HIGH (ref 8–23)
CO2: 22 mmol/L (ref 22–32)
Calcium: 8.5 mg/dL — ABNORMAL LOW (ref 8.9–10.3)
Chloride: 102 mmol/L (ref 98–111)
Creatinine, Ser: 4.03 mg/dL — ABNORMAL HIGH (ref 0.61–1.24)
GFR, Estimated: 14 mL/min — ABNORMAL LOW (ref >60)
Glucose, Bld: 101 mg/dL — ABNORMAL HIGH (ref 70–99)
Phosphorus: 5.7 mg/dL — ABNORMAL HIGH (ref 2.5–4.6)
Potassium: 3.6 mmol/L (ref 3.5–5.1)
Sodium: 140 mmol/L (ref 135–145)

## 2020-04-14 LAB — GLUCOSE, CAPILLARY: Glucose-Capillary: 91 mg/dL (ref 70–99)

## 2020-04-14 MED ORDER — POTASSIUM CHLORIDE CRYS ER 20 MEQ PO TBCR
40.0000 meq | EXTENDED_RELEASE_TABLET | Freq: Once | ORAL | Status: AC
  Administered 2020-04-14: 40 meq via ORAL
  Filled 2020-04-14: qty 2

## 2020-04-14 MED ORDER — METOLAZONE 2.5 MG PO TABS
2.5000 mg | ORAL_TABLET | Freq: Every day | ORAL | Status: DC
  Administered 2020-04-14: 2.5 mg via ORAL
  Filled 2020-04-14: qty 1

## 2020-04-14 NOTE — Plan of Care (Signed)
°  Problem: Activity: °Goal: Risk for activity intolerance will decrease °Outcome: Progressing °  °Problem: Elimination: °Goal: Will not experience complications related to bowel motility °Outcome: Progressing °Goal: Will not experience complications related to urinary retention °Outcome: Progressing °  °

## 2020-04-14 NOTE — Progress Notes (Signed)
PROGRESS NOTE  [facility] [id number] DOB: 09/06/1940 DOA: 04/10/2020 PCP: [name], MD  Brief History   The patient is a 80 yr old man who presented to [facility] with complaints of increasing lower extremity edema and shortness of breath. The patient was asked to come to the ED after a visit to the hypertension clinic. The patient had had a change in medications at the end of January. In the last week he had doubled the dose of lasix he was taken. He has gained about 25 lbs since January.   He carries a past medical history significant for CAD s/p stenting for CABG, asbestosis, hypertension, CKD IIIa, hypertension, and OSA.  In the ED he was found to have a creatinine of 4.23 with a GFR of 14. The patient's baseline creatinine is 2.6. He had a metabolic acidosis with a bicarbonate of 19.  BNP is elevated at 736.9. CXR demonstrated no acute cardiopulmonary findings.  [facility] were consulted to admit the patient for further evaluation and treatment. The patient was admitted to a telemetry bed by my colleague, Dr. Hal Hope early this morning. Cardiology and nephrology have been consulted. He is receiving IV lasix. His I's and O's are being carefully monitored.  Cardiology feels that the patient's volume oveload is due to to renal insufficiency. Pt had mobitz II, type 1 AVB overnight. Transient. The patient is asymptomatic.   Nephrology and cardiology feel that the patient's elevated creatinine is due to a progression of intrinsic renal disease. Anemia due to iron deficiency. Feraheme given.  Vein mapping was performed on 04/12/2020.  Lasix increased to 80 mg bid due to inadequate response to diuresis on lower dose of lasix. Improved urinary output resulted.  Consultants  . Nephrology . Cardiology  Procedures  . None  Antibiotics   Anti-infectives (From admission, onward)   None     Subjective  The patient is resting comfortably. No new complaints.  Objective    Vitals:  Vitals:   04/14/20 0807 04/14/20 1100  BP: (!) 164/66 (!) 155/68  Pulse: 88 81  Resp: 16 20  Temp: 98.1 F (36.7 C) 97.6 F (36.4 C)  SpO2: 97% 97%   Exam:  Constitutional:  . The patient is awake, alert, and oriented x 3. No acute distress. Respiratory:  . No increased work of breathing. . No wheezes, rales, or rhonchi . No tactile fremitus Cardiovascular:  . Regular rate and rhythm . No murmurs, ectopy, or gallups. . No lateral PMI. No thrills. Abdomen:  . Abdomen is soft, non-tender, non-distended . No hernias, masses, or organomegaly . Normoactive bowel sounds.  Musculoskeletal:  . No cyanosis or clubbing . Bilateral lower extremity edema Skin:  . No rashes, lesions, ulcers . palpation of skin: no induration or nodules Neurologic:  . CN 2-12 intact . Sensation all 4 extremities intact Psychiatric:  . Mental status o Mood, affect appropriate o Orientation to person, place, time  . judgment and insight appear intact  I have personally reviewed the following:   Today's Data  . Vitals, BMP, CBC  Imaging  . CXR  Cardiology Data  . Echocardiogram is pending.  Scheduled Meds: . amLODipine  10 mg Oral Daily  . aspirin EC  81 mg Oral Daily  . carvedilol  3.125 mg Oral BID  . clopidogrel  75 mg Oral Daily  . ezetimibe  10 mg Oral Daily  . heparin  5,000 Units Subcutaneous Q8H  . latanoprost  1 drop Both Eyes QHS  . metolazone  2.5 mg Oral Daily  . omega-3 acid ethyl esters  1 g Oral Daily  . rosuvastatin  10 mg Oral Daily  . umeclidinium-vilanterol  1 puff Inhalation Daily   Principal Problem:   Acute CHF (congestive heart failure) (HCC) Active Problems:   Coronary artery disease   Hyperlipidemia   Asbestosis ([place])   Hypertension   ARF (acute renal failure) ([place])   LOS: 3 days   A & P   Acute Systolic CHF:  Echo performed prior to CABG in 2016 demonstrated an EF of 60%. 2D echo is p ending. Patient is receiving 40 IV every 12  hours. Will monitor volume status, renal status and electrolytes. Cardiology consulted. Must monitor closely for worsening.  Acute on chronic kidney disease stage III. It seems that the patient's baseline creatinine is 2.61 and corresponds to a CKD IIIa. Creatinine this am is 4.44. Nephrology has been consulted. Will follow creatinine, electrolytes, and volume status. Avoid nephrotoxic substances and hypotension. Lasix increased to 80 mg IV bid. Must monitor closely for worsening. Improved urine output.   Proteinuria: Noted. Nephrology consulted.   Second degree AV block: Unable to determine if this is Mobitz 1 or 2 as the p wave is difficult to see as it is frequntly buried in ST segment. Cardiology consulted. Continue to monitor on telemetry.   CAD status post CABG and stenting denies any chest pain. On aspirin statins antiplatelet agents and beta-blockers. Cardiology consulted.  Essential Hypertension: Pt is on amlodipine 10 mg daily, and carvidilol 6.25 at home. He has been continued on amlodipine 10 mg daily and carvedilol 3.125 mg bid. Monitor.  History of asbestosis being followed by pulmonologist. Pt is not on O2 at home.  I have seen and examined this patient myself. I have spent 34 minutes in he evaluation and care.  DVT prophylaxis: Heparin. Code Status: Full code. Family Communication: Discussed with patient. Disposition Plan: Home.  Athena Baltz, DO [facility] Direct contact: see [email or web address]  7PM-7AM contact night coverage as above 04/14/2020, 2:36 PM  LOS: 0 days

## 2020-04-14 NOTE — Progress Notes (Signed)
DAILY PROGRESS NOTE   Patient Name: Zaakir Kivi Date of Encounter: 04/14/2020 Cardiologist: Larae Grooms, MD  Chief Complaint   Feels better  Patient Profile   Peter Oneill is a 80 y.o. male with a PMH of CAD s/p CABG in 2003 with repeat CABG in 2016 and subsequent stenting to LCx in 2017, carotid artery disease s/p CEA in 1996, HTN, HLD, CKD stage IV, and OSA, who is being seen today for the evaluation of CHF at the request of Dr. Benny Lennert.  Subjective   Good urine output with lasix and metolazone - negative 2.5L. Edema is improved.  Objective   Vitals:   04/13/20 2339 04/14/20 0222 04/14/20 0400 04/14/20 0807  BP: (!) 134/57  (!) 124/54 (!) 164/66  Pulse: 80  82 88  Resp: '16  16 16  '$ Temp: 98.2 F (36.8 C)  97.8 F (36.6 C) 98.1 F (36.7 C)  TempSrc: Oral  Oral Oral  SpO2: 93%  95% 97%  Weight:  102.8 kg    Height:        Intake/Output Summary (Last 24 hours) at 04/14/2020 1019 Last data filed at 04/14/2020 1001 Gross per 24 hour  Intake 1010 ml  Output 4075 ml  Net -3065 ml   Filed Weights   04/12/20 0505 04/13/20 0457 04/14/20 0222  Weight: 104.6 kg 104.4 kg 102.8 kg    Physical Exam   General appearance: alert and no distress Lungs: clear to auscultation bilaterally Heart: regular rate and rhythm Extremities: edema 1+ bilateral LE edema Neurologic: Grossly normal  Inpatient Medications    Scheduled Meds: . amLODipine  10 mg Oral Daily  . aspirin EC  81 mg Oral Daily  . carvedilol  3.125 mg Oral BID  . clopidogrel  75 mg Oral Daily  . ezetimibe  10 mg Oral Daily  . heparin  5,000 Units Subcutaneous Q8H  . latanoprost  1 drop Both Eyes QHS  . metolazone  2.5 mg Oral Daily  . omega-3 acid ethyl esters  1 g Oral Daily  . rosuvastatin  10 mg Oral Daily  . umeclidinium-vilanterol  1 puff Inhalation Daily    Continuous Infusions: . ferumoxytol    . furosemide 160 mg (04/14/20 0910)    PRN Meds: acetaminophen **OR** acetaminophen,  nitroGLYCERIN   Labs   Results for orders placed or performed during the hospital encounter of 04/10/20 (from the past 48 hour(s))  Sodium, urine, random     Status: None   Collection Time: 04/12/20  7:08 PM  Result Value Ref Range   Sodium, Ur 27 mmol/L    Comment: Performed at [facility] [address], [place], [place] [address]  Ferritin     Status: None   Collection Time: 04/12/20  8:07 PM  Result Value Ref Range   Ferritin 82 24 - 336 ng/mL    Comment: Performed at [facility] [address] [place], [place] [address]  Renal function panel     Status: Abnormal   Collection Time: 04/13/20  3:01 AM  Result Value Ref Range   Sodium 137 135 - 145 mmol/L   Potassium 3.6 3.5 - 5.1 mmol/L   Chloride 106 98 - 111 mmol/L   CO2 21 (L) 22 - 32 mmol/L   Glucose, Bld 96 70 - 99 mg/dL    Comment: Glucose reference range applies only to samples taken after fasting for at least 8 hours.   BUN 111 (H) 8 - 23 mg/dL  Creatinine, Ser 4.18 (H) 0.61 - 1.24 mg/dL   Calcium 8.1 (L) 8.9 - 10.3 mg/dL   Phosphorus 5.6 (H) 2.5 - 4.6 mg/dL   Albumin 2.8 (L) 3.5 - 5.0 g/dL   GFR, Estimated 14 (L) >60 mL/min    Comment: (NOTE) Calculated using the CKD-EPI Creatinine Equation (2021)    Anion gap 10 5 - 15    Comment: Performed at [facility] [address], [place], [place] [address]  Glucose, capillary     Status: Abnormal   Collection Time: 04/13/20  9:19 PM  Result Value Ref Range   Glucose-Capillary 118 (H) 70 - 99 mg/dL    Comment: Glucose reference range applies only to samples taken after fasting for at least 8 hours.   Comment 1 Notify RN    Comment 2 Document in Chart   Renal function panel     Status: Abnormal   Collection Time: 04/14/20  4:18 AM  Result Value Ref Range   Sodium 140 135 - 145 mmol/L   Potassium 3.6 3.5 - 5.1 mmol/L   Chloride 102 98 - 111 mmol/L   CO2 22 22 - 32 mmol/L   Glucose, Bld 101 (H) 70 - 99 mg/dL    Comment: Glucose reference  range applies only to samples taken after fasting for at least 8 hours.   BUN 111 (H) 8 - 23 mg/dL   Creatinine, Ser 4.03 (H) 0.61 - 1.24 mg/dL   Calcium 8.5 (L) 8.9 - 10.3 mg/dL   Phosphorus 5.7 (H) 2.5 - 4.6 mg/dL   Albumin 2.9 (L) 3.5 - 5.0 g/dL   GFR, Estimated 14 (L) >60 mL/min    Comment: (NOTE) Calculated using the CKD-EPI Creatinine Equation (2021)    Anion gap 16 (H) 5 - 15    Comment: Performed at [facility] [address], [place],  [address]  CBC with Differential/Platelet     Status: Abnormal   Collection Time: 04/14/20  4:18 AM  Result Value Ref Range   WBC 7.3 4.0 - 10.5 K/uL   RBC 3.05 (L) 4.22 - 5.81 MIL/uL   Hemoglobin 9.6 (L) 13.0 - 17.0 g/dL   HCT 27.3 (L) 39.0 - 52.0 %   MCV 89.5 80.0 - 100.0 fL   MCH 31.5 26.0 - 34.0 pg   MCHC 35.2 30.0 - 36.0 g/dL   RDW 15.5 11.5 - 15.5 %   Platelets 121 (L) 150 - 400 K/uL    Comment: Immature Platelet Fraction may be clinically indicated, consider ordering this additional test [id number]    nRBC 0.3 (H) 0.0 - 0.2 %   Neutrophils Relative % 72 %   Neutro Abs 5.3 1.7 - 7.7 K/uL   Lymphocytes Relative 10 %   Lymphs Abs 0.7 0.7 - 4.0 K/uL   Monocytes Relative 10 %   Monocytes Absolute 0.7 0.1 - 1.0 K/uL   Eosinophils Relative 7 %   Eosinophils Absolute 0.5 0.0 - 0.5 K/uL   Basophils Relative 0 %   Basophils Absolute 0.0 0.0 - 0.1 K/uL   Immature Granulocytes 1 %   Abs Immature Granulocytes 0.07 0.00 - 0.07 K/uL    Comment: Performed at [facility], [place] [address], [place], [place] [address]  Glucose, capillary     Status: None   Collection Time: 04/14/20  6:12 AM  Result Value Ref Range   Glucose-Capillary 91 70 - 99 mg/dL    Comment: Glucose reference range applies only to samples  taken after fasting for at least 8 hours.   Comment 1 Notify RN    Comment 2 Document in Chart     ECG   N/A  Telemetry   Sinus rhythm  - Personally Reviewed  Radiology    VAS [place] UPPER EXT VEIN MAPPING  (PRE-OP AVF)  Result Date: 04/12/2020 UPPER EXTREMITY VEIN MAPPING  Indications: Pre-access. Comparison Study: No previous Performing Technologist: Vonzell Schlatter RVT  Examination Guidelines: A complete evaluation includes B-mode imaging, spectral Doppler, color Doppler, and power Doppler as needed of all accessible portions of each vessel. Bilateral testing is considered an integral part of a complete examination. Limited examinations for reoccurring indications may be performed as noted. +-----------------+-------------+----------+--------+ Right Cephalic   Diameter (cm)Depth (cm)Findings +-----------------+-------------+----------+--------+ Shoulder             0.38        1.20            +-----------------+-------------+----------+--------+ Prox upper arm       0.41        0.64            +-----------------+-------------+----------+--------+ Mid upper arm        0.45        0.35            +-----------------+-------------+----------+--------+ Dist upper arm       0.25        0.45            +-----------------+-------------+----------+--------+ Antecubital fossa    0.44        0.15   thrombus +-----------------+-------------+----------+--------+ Prox forearm         0.40        0.41            +-----------------+-------------+----------+--------+ Mid forearm          0.34        0.37            +-----------------+-------------+----------+--------+ Dist forearm         0.42        0.42            +-----------------+-------------+----------+--------+ Wrist                0.20        0.50            +-----------------+-------------+----------+--------+ +-----------------+-------------+----------+--------+ Left Cephalic    Diameter (cm)Depth (cm)Findings +-----------------+-------------+----------+--------+ Shoulder             0.40        0.33            +-----------------+-------------+----------+--------+ Prox upper arm       0.40        0.33             +-----------------+-------------+----------+--------+ Mid upper arm        0.51        0.19            +-----------------+-------------+----------+--------+ Dist upper arm       0.42        0.17            +-----------------+-------------+----------+--------+ Antecubital fossa    0.51        0.21            +-----------------+-------------+----------+--------+ Prox forearm         0.26        0.47            +-----------------+-------------+----------+--------+ Mid forearm  0.25        0.38            +-----------------+-------------+----------+--------+ Dist forearm         0.23        0.31            +-----------------+-------------+----------+--------+ Wrist                0.20        0.36            +-----------------+-------------+----------+--------+ +-----------------+-------------+----------+---------+ Left Basilic     Diameter (cm)Depth (cm)Findings  +-----------------+-------------+----------+---------+ Prox upper arm       0.43        0.95             +-----------------+-------------+----------+---------+ Mid upper arm        0.44        0.62             +-----------------+-------------+----------+---------+ Dist upper arm       0.44        0.49             +-----------------+-------------+----------+---------+ Antecubital fossa    0.36        0.37   branching +-----------------+-------------+----------+---------+ Prox forearm         0.26        0.22             +-----------------+-------------+----------+---------+ Mid forearm          0.23        0.20             +-----------------+-------------+----------+---------+ Distal forearm       0.15        0.25             +-----------------+-------------+----------+---------+ *See table(s) above for measurements and observations.  Diagnosing physician: Monica Martinez MD Electronically signed by Monica Martinez MD on 04/12/2020 at 4:33:44 PM.    Final     Cardiac  Studies   See echo above  Assessment   Principal Problem:   Acute CHF (congestive heart failure) ([facility]) Active Problems:   Coronary artery disease   Hyperlipidemia   Asbestosis ([place])   Hypertension   ARF (acute renal failure) ([place])   Plan   1. Better diuresis yesterday with metolazone and lasix - now 4L negative. Edema is improving. Creatinine is slowing improving.   Time Spent Directly with Patient:  I have spent a total of 25 minutes with the patient reviewing hospital notes, telemetry, EKGs, labs and examining the patient as well as establishing an assessment and plan that was discussed personally with the patient.  > 50% of time was spent in direct patient care.  Length of Stay:  LOS: 3 days   Pixie Casino, MD, [facility], [place] Director of the Advanced Lipid Disorders &  Cardiovascular Risk Reduction Clinic Diplomate of the American Board of Clinical Lipidology Attending Cardiologist  Direct Dial: [phone]  Fax: [phone]  Website:  [email or web address] 04/14/2020, 10:19 AM

## 2020-04-14 NOTE — Progress Notes (Signed)
[facility] ROUNDING NOTE   Subjective:   Brief History: 80 year old gentleman who has a history of coronary artery disease status post CABG, also has a history of chronic asbestosis, history of hypertension presents to the emergency room with increasing lower extremity edema and shortness of breath.  His baseline creatinine is about 2.5 mg/dL he was last evaluated in December 2021.  There have been very little change in his renal function. He is asked to present to the emergency room due to the above and labs that showed an increase in creatinine to 4.2 mg/dL.  Urine study shows 0-5 WBCs 0-5 RBCs per high-powered field.  Last renal ultrasound was performed in August 2020 that was essentially unremarkable.   Interval History: Required increasing Lasix in addition of metolazone yesterday to help with urine output.  Good urine output for the past 24 hours with 3.6 L.  Weight also improved.  Patient is feeling better today  Objective:  Vital signs in last 24 hours:  Temp:  [97.5 F (36.4 C)-98.2 F (36.8 C)] 97.8 F (36.6 C) (02/19 0400) Pulse Rate:  [78-82] 82 (02/19 0400) Resp:  [15-18] 16 (02/19 0400) BP: (124-162)/(54-77) 124/54 (02/19 0400) SpO2:  [93 %-99 %] 95 % (02/19 0400) Weight:  [102.8 kg] 102.8 kg (02/19 0222)  Weight change: -1.633 kg Filed Weights   04/12/20 0505 04/13/20 0457 04/14/20 0222  Weight: 104.6 kg 104.4 kg 102.8 kg    Intake/Output: I/O last 3 completed shifts: In: [id number] [P.O.:1080; IV Piggyback:50] Out: [id number] [Urine:4350]   Intake/Output this shift:  No intake/output data recorded.  GEN: wdwn, sitting in bed, nad ENT: no nasal discharge, mmm EYES: no scleral icterus, eomi CV: normal rate, no murmurs PULM: no iwob, bilateral chest rise ABD: NABS, mild distention SKIN: no rashes or jaundice EXT: Trace edema, warm and well perfused    Basic Metabolic Panel: Recent Labs  Lab 04/10/20 1735 04/11/20 0330 04/12/20 0230 04/13/20 0301  04/14/20 0418  NA 137 139 138 137 140  K 4.5 4.5 4.1 3.6 3.6  CL 105 106 106 106 102  CO2 19* 19* 20* 21* 22  GLUCOSE 126* 115* 115* 96 101*  BUN 105* 109* 110* 111* 111*  CREATININE 4.23* 4.44* 4.38* 4.18* 4.03*  CALCIUM 8.5* 8.3* 8.0* 8.1* 8.5*  MG  --  2.8*  --   --   --   PHOS  --   --  6.1* 5.6* 5.7*    Liver Function Tests: Recent Labs  Lab 04/11/20 0330 04/12/20 0230 04/13/20 0301 04/14/20 0418  AST 34  --   --   --   ALT 23  --   --   --   ALKPHOS 66  --   --   --   BILITOT 1.0  --   --   --   PROT 6.0*  --   --   --   ALBUMIN 3.4* 2.9* 2.8* 2.9*   No results for input(s): LIPASE, AMYLASE in the last 168 hours. No results for input(s): AMMONIA in the last 168 hours.  CBC: Recent Labs  Lab 04/10/20 1735 04/11/20 0330 04/14/20 0418  WBC 7.4 7.2 7.3  NEUTROABS  --   --  5.3  HGB 10.4* 9.7* 9.6*  HCT 31.9* 29.7* 27.3*  MCV 93.5 92.2 89.5  PLT 171 145* 121*    Cardiac Enzymes: No results for input(s): CKTOTAL, CKMB, CKMBINDEX, TROPONINI in the last 168 hours.  BNP: Invalid input(s): POCBNP  CBG: Recent Labs  Lab 04/13/20 2119 04/14/20 0612  GLUCAP 118* 91    Microbiology: Results for orders placed or performed during the hospital encounter of 04/10/20  Resp Panel by RT-PCR (Flu A&B, Covid) Nasopharyngeal Swab     Status: None   Collection Time: 04/10/20 11:38 PM   Specimen: Nasopharyngeal Swab; Nasopharyngeal(NP) swabs in vial transport medium  Result Value Ref Range Status   SARS Coronavirus 2 by RT PCR NEGATIVE NEGATIVE Final    Comment: (NOTE) SARS-CoV-2 target nucleic acids are NOT DETECTED.  The SARS-CoV-2 RNA is generally detectable in upper respiratory specimens during the acute phase of infection. The lowest concentration of SARS-CoV-2 viral copies this assay can detect is 138 copies/mL. A negative result does not preclude SARS-Cov-2 infection and should not be used as the sole basis for treatment or other patient management  decisions. A negative result may occur with  improper specimen collection/handling, submission of specimen other than nasopharyngeal swab, presence of viral mutation(s) within the areas targeted by this assay, and inadequate number of viral copies(<138 copies/mL). A negative result must be combined with clinical observations, patient history, and epidemiological information. The expected result is Negative.  Fact Sheet for Patients:  EntrepreneurPulse.com.au  Fact Sheet for Healthcare Providers:  IncredibleEmployment.be  This test is no t yet approved or cleared by the [place] FDA and  has been authorized for detection and/or diagnosis of SARS-CoV-2 by FDA under an Emergency Use Authorization (EUA). This EUA will remain  in effect (meaning this test can be used) for the duration of the COVID-19 declaration under Section 564(b)(1) of the Act, 21 U.S.C.section 360bbb-3(b)(1), unless the authorization is terminated  or revoked sooner.       Influenza A by PCR NEGATIVE NEGATIVE Final   Influenza B by PCR NEGATIVE NEGATIVE Final    Comment: (NOTE) The Xpert Xpress SARS-CoV-2/FLU/RSV plus assay is intended as an aid in the diagnosis of influenza from Nasopharyngeal swab specimens and should not be used as a sole basis for treatment. Nasal washings and aspirates are unacceptable for Xpert Xpress SARS-CoV-2/FLU/RSV testing.  Fact Sheet for Patients: EntrepreneurPulse.com.au  Fact Sheet for Healthcare Providers: IncredibleEmployment.be  This test is not yet approved or cleared by the [place] FDA and has been authorized for detection and/or diagnosis of SARS-CoV-2 by FDA under an Emergency Use Authorization (EUA). This EUA will remain in effect (meaning this test can be used) for the duration of the COVID-19 declaration under Section 564(b)(1) of the Act, 21 U.S.C. section 360bbb-3(b)(1), unless the  authorization is terminated or revoked.  Performed at [facility], [place] [address] [place], [place] [address]     Coagulation Studies: No results for input(s): LABPROT, INR in the last 72 hours.  Urinalysis: No results for input(s): COLORURINE, LABSPEC, PHURINE, GLUCOSEU, HGBUR, BILIRUBINUR, KETONESUR, PROTEINUR, UROBILINOGEN, NITRITE, LEUKOCYTESUR in the last 72 hours.  Invalid input(s): APPERANCEUR    Imaging: VAS [place] UPPER EXT VEIN MAPPING (PRE-OP AVF)  Result Date: 04/12/2020 UPPER EXTREMITY VEIN MAPPING  Indications: Pre-access. Comparison Study: No previous Performing Technologist: Vonzell Schlatter RVT  Examination Guidelines: A complete evaluation includes B-mode imaging, spectral Doppler, color Doppler, and power Doppler as needed of all accessible portions of each vessel. Bilateral testing is considered an integral part of a complete examination. Limited examinations for reoccurring indications may be performed as noted. +-----------------+-------------+----------+--------+ Right Cephalic   Diameter (cm)Depth (cm)Findings +-----------------+-------------+----------+--------+ Shoulder             0.38  1.20            +-----------------+-------------+----------+--------+ Prox upper arm       0.41        0.64            +-----------------+-------------+----------+--------+ Mid upper arm        0.45        0.35            +-----------------+-------------+----------+--------+ Dist upper arm       0.25        0.45            +-----------------+-------------+----------+--------+ Antecubital fossa    0.44        0.15   thrombus +-----------------+-------------+----------+--------+ Prox forearm         0.40        0.41            +-----------------+-------------+----------+--------+ Mid forearm          0.34        0.37            +-----------------+-------------+----------+--------+ Dist forearm         0.42        0.42             +-----------------+-------------+----------+--------+ Wrist                0.20        0.50            +-----------------+-------------+----------+--------+ +-----------------+-------------+----------+--------+ Left Cephalic    Diameter (cm)Depth (cm)Findings +-----------------+-------------+----------+--------+ Shoulder             0.40        0.33            +-----------------+-------------+----------+--------+ Prox upper arm       0.40        0.33            +-----------------+-------------+----------+--------+ Mid upper arm        0.51        0.19            +-----------------+-------------+----------+--------+ Dist upper arm       0.42        0.17            +-----------------+-------------+----------+--------+ Antecubital fossa    0.51        0.21            +-----------------+-------------+----------+--------+ Prox forearm         0.26        0.47            +-----------------+-------------+----------+--------+ Mid forearm          0.25        0.38            +-----------------+-------------+----------+--------+ Dist forearm         0.23        0.31            +-----------------+-------------+----------+--------+ Wrist                0.20        0.36            +-----------------+-------------+----------+--------+ +-----------------+-------------+----------+---------+ Left Basilic     Diameter (cm)Depth (cm)Findings  +-----------------+-------------+----------+---------+ Prox upper arm       0.43        0.95             +-----------------+-------------+----------+---------+ Mid upper arm        0.44  0.62             +-----------------+-------------+----------+---------+ Dist upper arm       0.44        0.49             +-----------------+-------------+----------+---------+ Antecubital fossa    0.36        0.37   branching +-----------------+-------------+----------+---------+ Prox forearm         0.26        0.22              +-----------------+-------------+----------+---------+ Mid forearm          0.23        0.20             +-----------------+-------------+----------+---------+ Distal forearm       0.15        0.25             +-----------------+-------------+----------+---------+ *See table(s) above for measurements and observations.  Diagnosing physician: Monica Martinez MD Electronically signed by Monica Martinez MD on 04/12/2020 at 4:33:44 PM.    Final      Medications:   . ferumoxytol    . furosemide     . amLODipine  10 mg Oral Daily  . aspirin EC  81 mg Oral Daily  . carvedilol  3.125 mg Oral BID  . clopidogrel  75 mg Oral Daily  . ezetimibe  10 mg Oral Daily  . heparin  5,000 Units Subcutaneous Q8H  . latanoprost  1 drop Both Eyes QHS  . metolazone  2.5 mg Oral Daily  . omega-3 acid ethyl esters  1 g Oral Daily  . potassium chloride  40 mEq Oral Once  . rosuvastatin  10 mg Oral Daily  . umeclidinium-vilanterol  1 puff Inhalation Daily   acetaminophen **OR** acetaminophen, nitroGLYCERIN  Assessment/ Plan:   Acute on chronic kidney disease: Baseline creatinine 2.5.  Unclear why he had recent rise.  Possible cardiorenal syndrome or progression of his disease   Continue diuretics as below  Monitor kidney function daily as well as ins and outs; creatinine tolerating diuresis at this time  Status post vein mapping, hold off on vascular surgery consult  Avoiding nephrotoxic agents  Anemia: Multifactorial with some dilution and iron deficiency.  Status post IV iron.  Diuresis as below  Volume/hypertension: Continues to be volume overloaded.  Likely due to decreased GFR he will require the higher Lasix dose.  Patient states his baseline weight is around 90-98kg. Weight down to 102.8 today. Continue lasix '160mg'$  TID and metolazone 2.'5mg'$  daily for now. Transition to oral diuretics when closer to EDW  Asbestosis has seen pulmonology in the past.  Has got significant obstructive  and restrictive lung disease.  Congestive heart failure we will continue diuretics at this point.  EF 50-55% percent with RV dysfunction.  Cardiology following    LOS: 3 Reesa Chew '@TODAY''@8'$ :59 AM

## 2020-04-15 DIAGNOSIS — I5031 Acute diastolic (congestive) heart failure: Secondary | ICD-10-CM

## 2020-04-15 LAB — RENAL FUNCTION PANEL
Albumin: 2.9 g/dL — ABNORMAL LOW (ref 3.5–5.0)
Anion gap: 16 — ABNORMAL HIGH (ref 5–15)
BUN: 109 mg/dL — ABNORMAL HIGH (ref 8–23)
CO2: 24 mmol/L (ref 22–32)
Calcium: 8.7 mg/dL — ABNORMAL LOW (ref 8.9–10.3)
Chloride: 101 mmol/L (ref 98–111)
Creatinine, Ser: 4.05 mg/dL — ABNORMAL HIGH (ref 0.61–1.24)
GFR, Estimated: 14 mL/min — ABNORMAL LOW (ref >60)
Glucose, Bld: 96 mg/dL (ref 70–99)
Phosphorus: 5.3 mg/dL — ABNORMAL HIGH (ref 2.5–4.6)
Potassium: 3.8 mmol/L (ref 3.5–5.1)
Sodium: 141 mmol/L (ref 135–145)

## 2020-04-15 MED ORDER — FUROSEMIDE 10 MG/ML IJ SOLN
160.0000 mg | Freq: Two times a day (BID) | INTRAVENOUS | Status: AC
  Administered 2020-04-15 (×2): 160 mg via INTRAVENOUS
  Filled 2020-04-15 (×2): qty 16

## 2020-04-15 MED ORDER — FUROSEMIDE 80 MG PO TABS
80.0000 mg | ORAL_TABLET | Freq: Two times a day (BID) | ORAL | Status: DC
  Administered 2020-04-16 – 2020-04-17 (×4): 80 mg via ORAL
  Filled 2020-04-15 (×4): qty 1

## 2020-04-15 MED ORDER — METOLAZONE 2.5 MG PO TABS
2.5000 mg | ORAL_TABLET | Freq: Every day | ORAL | Status: AC
  Administered 2020-04-15: 2.5 mg via ORAL
  Filled 2020-04-15: qty 1

## 2020-04-15 NOTE — Progress Notes (Signed)
[facility] ROUNDING NOTE   Subjective:   Brief History: 80 year old gentleman who has a history of coronary artery disease status post CABG, also has a history of chronic asbestosis, history of hypertension presents to the emergency room with increasing lower extremity edema and shortness of breath.  His baseline creatinine is about 2.5 mg/dL he was last evaluated in December 2021.  There have been very little change in his renal function. He is asked to present to the emergency room due to the above and labs that showed an increase in creatinine to 4.2 mg/dL.  Urine study shows 0-5 WBCs 0-5 RBCs per high-powered field.  Last renal ultrasound was performed in August 2020 that was essentially unremarkable.   Interval History: Patient states he feels well today.  Continues to have good response to IV diuretics.  Weight is improving.  Objective:  Vital signs in last 24 hours:  Temp:  [97.4 F (36.3 C)-98 F (36.7 C)] 98 F (36.7 C) (02/20 0739) Pulse Rate:  [65-81] 75 (02/20 0739) Resp:  [13-21] 18 (02/20 0739) BP: (118-155)/(51-77) 155/68 (02/20 0739) SpO2:  [97 %-100 %] 98 % (02/20 0739) Weight:  [99.4 kg] 99.4 kg (02/20 0415)  Weight change: -3.357 kg Filed Weights   04/13/20 0457 04/14/20 0222 04/15/20 0415  Weight: 104.4 kg 102.8 kg 99.4 kg    Intake/Output: I/O last 3 completed shifts: In: 79 [P.O.:1080; IV Piggyback:50] Out: 5600 [Urine:5600]   Intake/Output this shift:  Total I/O In: 240 [P.O.:240] Out: 600 [Urine:600]  GEN: wdwn, sitting in bed, nad ENT: no nasal discharge, mmm EYES: no scleral icterus, eomi CV: normal rate, no murmurs PULM: no iwob, bilateral chest rise ABD: NABS, mild distention SKIN: no rashes or jaundice EXT: Trace edema, warm and well perfused    Basic Metabolic Panel: Recent Labs  Lab 04/11/20 0330 04/12/20 0230 04/13/20 0301 04/14/20 0418 04/15/20 0418  NA 139 138 137 140 141  K 4.5 4.1 3.6 3.6 3.8  CL 106 106  106 102 101  CO2 19* 20* 21* 22 24  GLUCOSE 115* 115* 96 101* 96  BUN 109* 110* 111* 111* 109*  CREATININE 4.44* 4.38* 4.18* 4.03* 4.05*  CALCIUM 8.3* 8.0* 8.1* 8.5* 8.7*  MG 2.8*  --   --   --   --   PHOS  --  6.1* 5.6* 5.7* 5.3*    Liver Function Tests: Recent Labs  Lab 04/11/20 0330 04/12/20 0230 04/13/20 0301 04/14/20 0418 04/15/20 0418  AST 34  --   --   --   --   ALT 23  --   --   --   --   ALKPHOS 66  --   --   --   --   BILITOT 1.0  --   --   --   --   PROT 6.0*  --   --   --   --   ALBUMIN 3.4* 2.9* 2.8* 2.9* 2.9*   No results for input(s): LIPASE, AMYLASE in the last 168 hours. No results for input(s): AMMONIA in the last 168 hours.  CBC: Recent Labs  Lab 04/10/20 1735 04/11/20 0330 04/14/20 0418  WBC 7.4 7.2 7.3  NEUTROABS  --   --  5.3  HGB 10.4* 9.7* 9.6*  HCT 31.9* 29.7* 27.3*  MCV 93.5 92.2 89.5  PLT 171 145* 121*    Cardiac Enzymes: No results for input(s): CKTOTAL, CKMB, CKMBINDEX, TROPONINI in the last 168 hours.  BNP: Invalid input(s): POCBNP  CBG: Recent Labs  Lab 04/13/20 2119 04/14/20 0612  GLUCAP 118* 91    Microbiology: Results for orders placed or performed during the hospital encounter of 04/10/20  Resp Panel by RT-PCR (Flu A&B, Covid) Nasopharyngeal Swab     Status: None   Collection Time: 04/10/20 11:38 PM   Specimen: Nasopharyngeal Swab; Nasopharyngeal(NP) swabs in vial transport medium  Result Value Ref Range Status   SARS Coronavirus 2 by RT PCR NEGATIVE NEGATIVE Final    Comment: (NOTE) SARS-CoV-2 target nucleic acids are NOT DETECTED.  The SARS-CoV-2 RNA is generally detectable in upper respiratory specimens during the acute phase of infection. The lowest concentration of SARS-CoV-2 viral copies this assay can detect is 138 copies/mL. A negative result does not preclude SARS-Cov-2 infection and should not be used as the sole basis for treatment or other patient management decisions. A negative result may occur  with  improper specimen collection/handling, submission of specimen other than nasopharyngeal swab, presence of viral mutation(s) within the areas targeted by this assay, and inadequate number of viral copies(<138 copies/mL). A negative result must be combined with clinical observations, patient history, and epidemiological information. The expected result is Negative.  Fact Sheet for Patients:  EntrepreneurPulse.com.au  Fact Sheet for Healthcare Providers:  IncredibleEmployment.be  This test is no t yet approved or cleared by the [place] FDA and  has been authorized for detection and/or diagnosis of SARS-CoV-2 by FDA under an Emergency Use Authorization (EUA). This EUA will remain  in effect (meaning this test can be used) for the duration of the COVID-19 declaration under Section 564(b)(1) of the Act, 21 U.S.C.section 360bbb-3(b)(1), unless the authorization is terminated  or revoked sooner.       Influenza A by PCR NEGATIVE NEGATIVE Final   Influenza B by PCR NEGATIVE NEGATIVE Final    Comment: (NOTE) The Xpert Xpress SARS-CoV-2/FLU/RSV plus assay is intended as an aid in the diagnosis of influenza from Nasopharyngeal swab specimens and should not be used as a sole basis for treatment. Nasal washings and aspirates are unacceptable for Xpert Xpress SARS-CoV-2/FLU/RSV testing.  Fact Sheet for Patients: EntrepreneurPulse.com.au  Fact Sheet for Healthcare Providers: IncredibleEmployment.be  This test is not yet approved or cleared by the [place] FDA and has been authorized for detection and/or diagnosis of SARS-CoV-2 by FDA under an Emergency Use Authorization (EUA). This EUA will remain in effect (meaning this test can be used) for the duration of the COVID-19 declaration under Section 564(b)(1) of the Act, 21 U.S.C. section 360bbb-3(b)(1), unless the authorization is terminated  or revoked.  Performed at [facility], [place] [address], [place], [place] [address]     Coagulation Studies: No results for input(s): LABPROT, INR in the last 72 hours.  Urinalysis: No results for input(s): COLORURINE, LABSPEC, PHURINE, GLUCOSEU, HGBUR, BILIRUBINUR, KETONESUR, PROTEINUR, UROBILINOGEN, NITRITE, LEUKOCYTESUR in the last 72 hours.  Invalid input(s): APPERANCEUR    Imaging: No results found.   Medications:   . ferumoxytol    . furosemide 160 mg (04/15/20 0843)   . amLODipine  10 mg Oral Daily  . aspirin EC  81 mg Oral Daily  . carvedilol  3.125 mg Oral BID  . clopidogrel  75 mg Oral Daily  . ezetimibe  10 mg Oral Daily  . [START ON 04/16/2020] furosemide  80 mg Oral BID  . heparin  5,000 Units Subcutaneous Q8H  . latanoprost  1 drop Both Eyes QHS  . omega-3 acid ethyl esters  1 g Oral  Daily  . rosuvastatin  10 mg Oral Daily  . umeclidinium-vilanterol  1 puff Inhalation Daily   acetaminophen **OR** acetaminophen, nitroGLYCERIN  Assessment/ Plan:   Acute on chronic kidney disease: Baseline creatinine 2.5.  Unclear why he had recent rise.  Possible cardiorenal syndrome or progression of his disease   Continue diuretics as below  Monitor kidney function daily as well as ins and outs; creatinine tolerating diuresis at this time  Status post vein mapping, hold off on vascular surgery consult  Avoiding nephrotoxic agents  Anemia: Multifactorial with some dilution and iron deficiency.  Status post IV iron.  Diuresis as below  Volume/hypertension: Continues to be volume overloaded.  Likely due to decreased GFR he will require the higher Lasix dose.  Patient states his baseline weight is around 90-98kg. Weight down to 99.4 today. Continue lasix '160mg'$  TID and metolazone 2.'5mg'$  today and plan to transition to oral Lasix 80 mg twice daily tomorrow  Asbestosis has seen pulmonology in the past.  Has got significant obstructive and restrictive lung  disease.  Congestive heart failure we will continue diuretics at this point.  EF 50-55% percent with RV dysfunction.  Cardiology following    LOS: 4 Reesa Chew '@TODAY''@8'$ :53 AM

## 2020-04-16 DIAGNOSIS — I5031 Acute diastolic (congestive) heart failure: Secondary | ICD-10-CM | POA: Diagnosis not present

## 2020-04-16 LAB — RENAL FUNCTION PANEL
Albumin: 2.9 g/dL — ABNORMAL LOW (ref 3.5–5.0)
Anion gap: 14 (ref 5–15)
BUN: 106 mg/dL — ABNORMAL HIGH (ref 8–23)
CO2: 26 mmol/L (ref 22–32)
Calcium: 8.4 mg/dL — ABNORMAL LOW (ref 8.9–10.3)
Chloride: 100 mmol/L (ref 98–111)
Creatinine, Ser: 3.79 mg/dL — ABNORMAL HIGH (ref 0.61–1.24)
GFR, Estimated: 16 mL/min — ABNORMAL LOW (ref >60)
Glucose, Bld: 89 mg/dL (ref 70–99)
Phosphorus: 5.3 mg/dL — ABNORMAL HIGH (ref 2.5–4.6)
Potassium: 3.5 mmol/L (ref 3.5–5.1)
Sodium: 140 mmol/L (ref 135–145)

## 2020-04-16 NOTE — Progress Notes (Signed)
PROGRESS NOTE  Peter Oneill [id number] DOB: [birth year] DOA: 04/10/2020 PCP: [name], MD  Brief History   The patient is a 80 yr old man who presented to [facility] with complaints of increasing lower extremity edema and shortness of breath. The patient was asked to come to the ED after a visit to the hypertension clinic. The patient had had a change in medications at the end of January. In the last week he had doubled the dose of lasix he was taken. He has gained about 25 lbs since January.   He carries a past medical history significant for CAD s/p stenting for CABG, asbestosis, hypertension, CKD IIIa, hypertension, and OSA.  In the ED he was found to have a creatinine of 4.23 with a GFR of 14. The patient's baseline creatinine is 2.6. He had a metabolic acidosis with a bicarbonate of 19.  BNP is elevated at 736.9. CXR demonstrated no acute cardiopulmonary findings.  [facility] were consulted to admit the patient for further evaluation and treatment. The patient was admitted to a telemetry bed by my colleague, Dr. Hal Hope early this morning. Cardiology and nephrology have been consulted. He is receiving IV lasix. His I's and O's are being carefully monitored.  Cardiology feels that the patient's volume oveload is due to to renal insufficiency. Pt had mobitz II, type 1 AVB overnight. Transient. The patient is asymptomatic.   Nephrology and cardiology feel that the patient's elevated creatinine is due to a progression of intrinsic renal disease. Anemia due to iron deficiency. Feraheme given.  Vein mapping was performed on 04/12/2020.  Lasix increased to 160 mg tid with metolazone due to inadequate response to diuresis on lower dose of lasix. Improved urinary output resulted.  Consultants  . Nephrology . Cardiology  Procedures  . None  Antibiotics   Anti-infectives (From admission, onward)   None     Subjective  The patient is resting comfortably. He states that he is  feeling better.  Objective   Vitals:  Vitals:   04/16/20 0401 04/16/20 0803  BP: 130/68   Pulse:    Resp: 20   Temp: 97.7 F (36.5 C)   SpO2: 97% 98%   Exam:  Constitutional:  . The patient is awake, alert, and oriented x 3. No acute distress. Respiratory:  . No increased work of breathing. . No wheezes, rales, or rhonchi . No tactile fremitus Cardiovascular:  . Regular rate and rhythm . No murmurs, ectopy, or gallups. . No lateral PMI. No thrills. Abdomen:  . Abdomen is soft, non-tender, non-distended . No hernias, masses, or organomegaly . Normoactive bowel sounds.  Musculoskeletal:  . No cyanosis or clubbing . Bilateral lower extremity edema 2+ Skin:  . No rashes, lesions, ulcers . palpation of skin: no induration or nodules Neurologic:  . CN 2-12 intact . Sensation all 4 extremities intact Psychiatric:  . Mental status o Mood, affect appropriate o Orientation to person, place, time  . judgment and insight appear intact  I have personally reviewed the following:   Today's Data  . Vitals, BMP  Imaging  . CXR  Cardiology Data  . Echocardiogram  Scheduled Meds: . amLODipine  10 mg Oral Daily  . aspirin EC  81 mg Oral Daily  . carvedilol  3.125 mg Oral BID  . clopidogrel  75 mg Oral Daily  . ezetimibe  10 mg Oral Daily  . furosemide  80 mg Oral BID  . heparin  5,000 Units Subcutaneous Q8H  . latanoprost  1  drop Both Eyes QHS  . omega-3 acid ethyl esters  1 g Oral Daily  . rosuvastatin  10 mg Oral Daily  . umeclidinium-vilanterol  1 puff Inhalation Daily   Principal Problem:   Acute CHF (congestive heart failure) (HCC) Active Problems:   Coronary artery disease   Hyperlipidemia   Asbestosis ([place])   Hypertension   ARF (acute renal failure) ([place])   LOS: 5 days   A & P   Acute Systolic CHF:  Echo performed prior to CABG in 2016 demonstrated an EF of 60%. 2D echo is p ending. Patient is receiving 40 IV every 12 hours. Will monitor volume  status, renal status and electrolytes. Cardiology consulted. Must monitor closely for worsening.  Acute on chronic kidney disease stage III. It seems that the patient's baseline creatinine is 2.61 and corresponds to a CKD IIIa. Creatinine this am is 4.44. Nephrology has been consulted. Will follow creatinine, electrolytes, and volume status. Avoid nephrotoxic substances and hypotension. Lasix increased to 160 mg IV tid with metolazone. Must monitor closely for worsening. Improved urine output.   Proteinuria: Noted. Nephrology consulted.   Second degree AV block: Unable to determine if this is Mobitz 1 or 2 as the p wave is difficult to see as it is frequntly buried in ST segment. Cardiology consulted. Continue to monitor on telemetry.   CAD status post CABG and stenting denies any chest pain. On aspirin statins antiplatelet agents and beta-blockers. Cardiology consulted.  Essential Hypertension: Pt is on amlodipine 10 mg daily, and carvidilol 6.25 at home. He has been continued on amlodipine 10 mg daily and carvedilol 3.125 mg bid. Monitor.  History of asbestosis being followed by pulmonologist. Pt is not on O2 at home.  I have seen and examined this patient myself. I have spent 32 minutes in he evaluation and care.  DVT prophylaxis: Heparin. Code Status: Full code. Family Communication: Discussed with patient. Disposition Plan: Home.  Jovonta Levit, DO [facility] Direct contact: see [email or web address]  7PM-7AM contact night coverage as above 04/15/2020, 3:06 PM  LOS: 0 days

## 2020-04-16 NOTE — Progress Notes (Addendum)
Progress Note  Patient Name: Peter Oneill Date of Encounter: 04/16/2020  [facility] Cardiologist: Larae Grooms, MD   Subjective   Breathing improving, reports back to normal at baseline given history of asbestosis.  No chest pain.  Inpatient Medications    Scheduled Meds: . amLODipine  10 mg Oral Daily  . aspirin EC  81 mg Oral Daily  . carvedilol  3.125 mg Oral BID  . clopidogrel  75 mg Oral Daily  . ezetimibe  10 mg Oral Daily  . furosemide  80 mg Oral BID  . heparin  5,000 Units Subcutaneous Q8H  . latanoprost  1 drop Both Eyes QHS  . omega-3 acid ethyl esters  1 g Oral Daily  . rosuvastatin  10 mg Oral Daily  . umeclidinium-vilanterol  1 puff Inhalation Daily   Continuous Infusions: . ferumoxytol     PRN Meds: acetaminophen **OR** acetaminophen, nitroGLYCERIN   Vital Signs    Vitals:   04/15/20 1931 04/15/20 2000 04/16/20 0401 04/16/20 0803  BP: (!) 146/63 (!) 120/58 130/68   Pulse:      Resp: (!) '24 17 20   '$ Temp: 97.9 F (36.6 C)  97.7 F (36.5 C)   TempSrc: Oral  Oral   SpO2: 96%  97% 98%  Weight:   97.1 kg   Height:        Intake/Output Summary (Last 24 hours) at 04/16/2020 1042 Last data filed at 04/16/2020 [id number] Gross per 24 hour  Intake 1440 ml  Output 3975 ml  Net -2535 ml   Last 3 Weights 04/16/2020 04/15/2020 04/14/2020  Weight (lbs) 214 lb 1.6 oz 219 lb 3.2 oz 226 lb 9.6 oz  Weight (kg) 97.115 kg 99.428 kg 102.785 kg      Telemetry    Normal sinus rhythm brief second-degree type I bradycardia ? - Personally Reviewed  ECG   No new tracing  Physical Exam   GEN: No acute distress.   Neck: No JVD Cardiac: RRR, no murmurs, rubs, or gallops.  Respiratory: Clear to auscultation bilaterally. GI: Soft, nontender, non-distended  MS: trace edema L > R; No deformity. Neuro:  Nonfocal  Psych: Normal affect   Labs    Chemistry Recent Labs  Lab 04/11/20 0330 04/12/20 0230 04/14/20 0418 04/15/20 0418 04/16/20 0259  NA 139    < > 140 141 140  K 4.5   < > 3.6 3.8 3.5  CL 106   < > 102 101 100  CO2 19*   < > '22 24 26  '$ GLUCOSE 115*   < > 101* 96 89  BUN 109*   < > 111* 109* 106*  CREATININE 4.44*   < > 4.03* 4.05* 3.79*  CALCIUM 8.3*   < > 8.5* 8.7* 8.4*  PROT 6.0*  --   --   --   --   ALBUMIN 3.4*   < > 2.9* 2.9* 2.9*  AST 34  --   --   --   --   ALT 23  --   --   --   --   ALKPHOS 66  --   --   --   --   BILITOT 1.0  --   --   --   --   GFRNONAA 13*   < > 14* 14* 16*  ANIONGAP 14   < > 16* 16* 14   < > = values in this interval not displayed.     Hematology Recent Labs  Lab 04/10/20  1735 04/11/20 0330 04/14/20 0418  WBC 7.4 7.2 7.3  RBC 3.41* 3.22* 3.05*  HGB 10.4* 9.7* 9.6*  HCT 31.9* 29.7* 27.3*  MCV 93.5 92.2 89.5  MCH 30.5 30.1 31.5  MCHC 32.6 32.7 35.2  RDW 15.9* 15.8* 15.5  PLT 171 145* 121*    BNP Recent Labs  Lab 04/10/20 1735  BNP 736.9*     DDimer No results for input(s): DDIMER in the last 168 hours.   Radiology    No results found.  Cardiac Studies   Echo 04/11/2020 1. Left ventricular ejection fraction, by estimation, is 50 to 55%. The  left ventricle has low normal function. The left ventricle demonstrates  regional wall motion abnormalities. Anteroseptal hypokinesis. There is  mild left ventricular hypertrophy.  Left ventricular diastolic parameters are indeterminate.  2. Right ventricular systolic function is mildly reduced. The right  ventricular size is mildly enlarged. Tricuspid regurgitation signal is  inadequate for assessing PA pressure.  3. The mitral valve is grossly normal. No evidence of mitral valve  regurgitation.  4. The aortic valve was not well visualized. Aortic valve regurgitation  is not visualized. No aortic stenosis is present.   Carotid doppler 04/03/20 Summary:  Right Carotid: Velocities in the right ICA are consistent with a 40-59%         stenosis. Velocities in the RICA have increased compared to  prior          exam. Stenosis based on PSV and plaque formation, s/p         endarterectomy.   Left Carotid: Velocities in the left ICA are consistent with a 40-59%  stenosis.        Velocities in the LICA have increased compared to prior  exam.   Vertebrals: Bilateral vertebral arteries demonstrate antegrade flow.  Subclavians: Bilateral subclavian arteries were stenotic. Bilateral  subclavian        artery flow was disturbed.   Patient Profile     80 y.o. male with a PMH of CAD s/p CABG in 2003 with repeat CABG in 2016 and subsequent stenting to LCx in 2017, carotid artery disease s/p CEA in 1996, HTN, HLD, CKD stage IV, and OSA,who is being seen for the evaluation ofCHFat the request of Dr. Benny Lennert.  Assessment & Plan    1. Acute on chronic diastolic CHF - Echo this admission showed LVEF of 50-55%, mild LVH, anteroseptal hypokinesis and grade II DD. Reduced RV function.  - He is getting IV lasix and got metolazone as well.  - Diuresed negative 9.9L. Weight down 234>>214.  - Continue Coreg - Diuretics managed by nephrology, appreciate assistance   2. Acute on CKD IV-V - Baseline creatinine 2.5 -Scr improving with diuresis - 4.44 from admit to 3.79 today  - As above   3. HTN - BP stable on current medications  4. CAD  - No angina - Continue ASA, Plavix, statin and BB  Sign off per MD.   For questions or updates, please contact [place] Please consult [email or web address] for contact info under        SignedLeanor Kail, PA  04/16/2020, 10:42 AM     Attending Note:   The patient was seen and examined.  Agree with assessment and plan as noted above.  Changes made to the above note as needed.  Patient seen and independently examined with Robbie Lis, PA .   We discussed all aspects of the encounter. I agree with the assessment and plan as stated above.  1.   Chronic diastolic CHF : Complicated by acute on chronic renal insufficiency.  The  patient is diuresed.  Nephrology is managing his diuresis.  He is feeling quite a bit better and in fact is back to his baseline.  In discussing his diet it is clear that he still eats some salty foods including processed meats-hotdogs, sausage, bacon.  I have strongly encouraged him to stay away from all processed meats.  He is stable to be discharged from a cardiology standpoint.  The nephrology team wanted to watch him for 1 more day.  He will return to see Dr. Irish Lack or our APP in the office in several weeks.   2. HTN:   Stable   Cardiology will sign off.  Call for questions    I have spent a total of 40 minutes with patient reviewing hospital  notes , telemetry, EKGs, labs and examining patient as well as establishing an assessment and plan that was discussed with the patient. > 50% of time was spent in direct patient care.    Thayer Headings, Brooke Bonito., MD, [facility] 04/16/2020, 2:01 PM [id number] [address],  [place] Pager [phone]

## 2020-04-16 NOTE — Progress Notes (Signed)
PROGRESS NOTE  Peter Oneill [id number] DOB: [birth year] DOA: 04/10/2020 PCP: [name], MD  Brief History   The patient is a 80 yr old man who presented to [facility] with complaints of increasing lower extremity edema and shortness of breath. The patient was asked to come to the ED after a visit to the hypertension clinic. The patient had had a change in medications at the end of January. In the last week he had doubled the dose of lasix he was taken. He has gained about 25 lbs since January.   He carries a past medical history significant for CAD s/p stenting for CABG, asbestosis, hypertension, CKD IIIa, hypertension, and OSA.  In the ED he was found to have a creatinine of 4.23 with a GFR of 14. The patient's baseline creatinine is 2.6. He had a metabolic acidosis with a bicarbonate of 19.  BNP is elevated at 736.9. CXR demonstrated no acute cardiopulmonary findings.  [facility] were consulted to admit the patient for further evaluation and treatment. The patient was admitted to a telemetry bed by my colleague, Dr. Hal Hope early this morning. Cardiology and nephrology have been consulted. He is receiving IV lasix. His I's and O's are being carefully monitored.  Cardiology feels that the patient's volume oveload is due to to renal insufficiency. Pt had mobitz II, type 1 AVB overnight. Transient. The patient is asymptomatic.   Nephrology and cardiology feel that the patient's elevated creatinine is due to a progression of intrinsic renal disease. Anemia due to iron deficiency. Feraheme given.  Vein mapping was performed on 04/12/2020.  Lasix 160 mg IV tid with metolazone transitioned to oral diuretics today. Monitor response.   Consultants  . Nephrology . Cardiology  Procedures  . None  Antibiotics   Anti-infectives (From admission, onward)   None     Subjective  The patient is resting comfortably. No new complaints.  Objective   Vitals:  Vitals:   04/16/20 0803  04/16/20 1131  BP:    Pulse:    Resp:    Temp:  97.6 F (36.4 C)  SpO2: 98%    Exam:  Constitutional:  . The patient is awake, alert, and oriented x 3. No acute distress. Respiratory:  . No increased work of breathing. . No wheezes, rales, or rhonchi . No tactile fremitus Cardiovascular:  . Regular rate and rhythm . No murmurs, ectopy, or gallups. . No lateral PMI. No thrills. Abdomen:  . Abdomen is soft, non-tender, non-distended . No hernias, masses, or organomegaly . Normoactive bowel sounds.  Musculoskeletal:  . No cyanosis or clubbing . Bilateral lower extremity edema 1-2+ Skin:  . No rashes, lesions, ulcers . palpation of skin: no induration or nodules Neurologic:  . CN 2-12 intact . Sensation all 4 extremities intact Psychiatric:  . Mental status o Mood, affect appropriate o Orientation to person, place, time  . judgment and insight appear intact  I have personally reviewed the following:   Today's Data  . Vitals, BMP  Imaging  . CXR  Cardiology Data  . Echocardiogram  Scheduled Meds: . amLODipine  10 mg Oral Daily  . aspirin EC  81 mg Oral Daily  . carvedilol  3.125 mg Oral BID  . clopidogrel  75 mg Oral Daily  . ezetimibe  10 mg Oral Daily  . furosemide  80 mg Oral BID  . heparin  5,000 Units Subcutaneous Q8H  . latanoprost  1 drop Both Eyes QHS  . omega-3 acid ethyl esters  1  g Oral Daily  . rosuvastatin  10 mg Oral Daily  . umeclidinium-vilanterol  1 puff Inhalation Daily   Principal Problem:   Acute CHF (congestive heart failure) (HCC) Active Problems:   Coronary artery disease   Hyperlipidemia   Asbestosis ([place])   Hypertension   ARF (acute renal failure) ([place])   LOS: 5 days   A & P   Acute Systolic CHF:  Echo performed prior to CABG in 2016 demonstrated an EF of 60%. 2D echo is p ending. Patient is receiving 40 IV every 12 hours. Will monitor volume status, renal status and electrolytes. The patient's fluid balance is currently  negative 10.294cc.  Must monitor closely for worsening. I appreciate cardiology's assistance.  Acute on chronic kidney disease stage III. It seems that the patient's baseline creatinine is 2.61 and corresponds to a CKD IIIa. Creatinine this am is 4.44. Nephrology has been consulted. Will follow creatinine, electrolytes, and volume status. Avoid nephrotoxic substances and hypotension. Lasix 160 mg IV tid with metolazone transitioned to oral diuretics today. Monitor response.   Proteinuria: Noted. Nephrology consulted.   Second degree AV block: Unable to determine if this is Mobitz 1 or 2 as the p wave is difficult to see as it is frequntly buried in ST segment. Cardiology consulted. Continue to monitor on telemetry.   CAD status post CABG and stenting denies any chest pain. On aspirin statins antiplatelet agents and beta-blockers. Cardiology consulted.  Essential Hypertension:  Pt is normotensive on amlodipine 10 mg daily, and carvidilol 3.125 here. He has been continued on amlodipine 10 mg daily and carvedilol has been reduced to  3.125 mg bid from 6.25 mg bid at home. Monitor.  History of asbestosis being followed by pulmonologist. Pt is not on O2 at home.  I have seen and examined this patient myself. I have spent 34 minutes in he evaluation and care.  DVT prophylaxis: Heparin. Code Status: Full code. Family Communication: Discussed with patient. Disposition Plan: Home.  Irving Bloor, DO [facility] Direct contact: see [email or web address]  7PM-7AM contact night coverage as above 04/16/2020, 2:03 PM  LOS: 0 days

## 2020-04-16 NOTE — Progress Notes (Signed)
[facility] ROUNDING NOTE   Subjective:   had 4.5 liters UOP over 2/20.  Started oral diuretics today.  Feels good -his wife thinks breathing is much better than when he came in.    Review of systems:   Shortness of breath at baseline due to asbestosis per pt; better now Denies chest pain Denies n/v ------------------ Brief History: 81 year old gentleman who has a history of coronary artery disease status post CABG, also has a history of chronic asbestosis, history of hypertension presents to the emergency room with increasing lower extremity edema and shortness of breath.  His baseline creatinine is about 2.5 mg/dL he was last evaluated in December 2021.  There have been very little change in his renal function.  He is asked to present to the emergency room due to the above and labs that showed an increase in creatinine to 4.2 mg/dL.  Urine study shows 0-5 WBCs 0-5 RBCs per high-powered field.  Last renal ultrasound was performed in August 2020 that was essentially unremarkable.    Objective:  Vital signs in last 24 hours:  Temp:  [97.6 F (36.4 C)-98 F (36.7 C)] 97.6 F (36.4 C) (02/21 1131) Pulse Rate:  [72] 72 (02/20 1347) Resp:  [17-24] 20 (02/21 0401) BP: (120-146)/(58-68) 130/68 (02/21 0401) SpO2:  [95 %-98 %] 98 % (02/21 0803) Weight:  [97.1 kg] 97.1 kg (02/21 0401)  Weight change: -2.313 kg Filed Weights   04/14/20 0222 04/15/20 0415 04/16/20 0401  Weight: 102.8 kg 99.4 kg 97.1 kg    Intake/Output: I/O last 3 completed shifts: In: 1440 [P.O.:1440] Out: 6075 [Urine:6075]   Intake/Output this shift:  Total I/O In: 360 [P.O.:360] Out: 975 [Urine:975]  General adult male in bed in no acute distress HEENT normocephalic atraumatic extraocular movements intact sclera anicteric Neck supple trachea midline Lungs clear to auscultation bilaterally normal work of breathing at rest  Heart S1S2 no rub Abdomen soft nontender nondistended Extremities no  pitting edema  Psych normal mood and affect Neuro alert and oriented; decreased hearing    Basic Metabolic Panel: Recent Labs  Lab 04/11/20 0330 04/12/20 0230 04/13/20 0301 04/14/20 0418 04/15/20 0418 04/16/20 0259  NA 139 138 137 140 141 140  K 4.5 4.1 3.6 3.6 3.8 3.5  CL 106 106 106 102 101 100  CO2 19* 20* 21* '22 24 26  '$ GLUCOSE 115* 115* 96 101* 96 89  BUN 109* 110* 111* 111* 109* 106*  CREATININE 4.44* 4.38* 4.18* 4.03* 4.05* 3.79*  CALCIUM 8.3* 8.0* 8.1* 8.5* 8.7* 8.4*  MG 2.8*  --   --   --   --   --   PHOS  --  6.1* 5.6* 5.7* 5.3* 5.3*    Liver Function Tests: Recent Labs  Lab 04/11/20 0330 04/12/20 0230 04/13/20 0301 04/14/20 0418 04/15/20 0418 04/16/20 0259  AST 34  --   --   --   --   --   ALT 23  --   --   --   --   --   ALKPHOS 66  --   --   --   --   --   BILITOT 1.0  --   --   --   --   --   PROT 6.0*  --   --   --   --   --   ALBUMIN 3.4* 2.9* 2.8* 2.9* 2.9* 2.9*   No results for input(s): LIPASE, AMYLASE in the last 168 hours. No results for input(s):  AMMONIA in the last 168 hours.  CBC: Recent Labs  Lab 04/10/20 1735 04/11/20 0330 04/14/20 0418  WBC 7.4 7.2 7.3  NEUTROABS  --   --  5.3  HGB 10.4* 9.7* 9.6*  HCT 31.9* 29.7* 27.3*  MCV 93.5 92.2 89.5  PLT 171 145* 121*    Cardiac Enzymes: No results for input(s): CKTOTAL, CKMB, CKMBINDEX, TROPONINI in the last 168 hours.  BNP: Invalid input(s): POCBNP  CBG: Recent Labs  Lab 04/13/20 2119 04/14/20 0612  GLUCAP 118* 91    Microbiology: Results for orders placed or performed during the hospital encounter of 04/10/20  Resp Panel by RT-PCR (Flu A&B, Covid) Nasopharyngeal Swab     Status: None   Collection Time: 04/10/20 11:38 PM   Specimen: Nasopharyngeal Swab; Nasopharyngeal(NP) swabs in vial transport medium  Result Value Ref Range Status   SARS Coronavirus 2 by RT PCR NEGATIVE NEGATIVE Final    Comment: (NOTE) SARS-CoV-2 target nucleic acids are NOT DETECTED.  The  SARS-CoV-2 RNA is generally detectable in upper respiratory specimens during the acute phase of infection. The lowest concentration of SARS-CoV-2 viral copies this assay can detect is 138 copies/mL. A negative result does not preclude SARS-Cov-2 infection and should not be used as the sole basis for treatment or other patient management decisions. A negative result may occur with  improper specimen collection/handling, submission of specimen other than nasopharyngeal swab, presence of viral mutation(s) within the areas targeted by this assay, and inadequate number of viral copies(<138 copies/mL). A negative result must be combined with clinical observations, patient history, and epidemiological information. The expected result is Negative.  Fact Sheet for Patients:  EntrepreneurPulse.com.au  Fact Sheet for Healthcare Providers:  IncredibleEmployment.be  This test is no t yet approved or cleared by the [place] FDA and  has been authorized for detection and/or diagnosis of SARS-CoV-2 by FDA under an Emergency Use Authorization (EUA). This EUA will remain  in effect (meaning this test can be used) for the duration of the COVID-19 declaration under Section 564(b)(1) of the Act, 21 U.S.C.section 360bbb-3(b)(1), unless the authorization is terminated  or revoked sooner.       Influenza A by PCR NEGATIVE NEGATIVE Final   Influenza B by PCR NEGATIVE NEGATIVE Final    Comment: (NOTE) The Xpert Xpress SARS-CoV-2/FLU/RSV plus assay is intended as an aid in the diagnosis of influenza from Nasopharyngeal swab specimens and should not be used as a sole basis for treatment. Nasal washings and aspirates are unacceptable for Xpert Xpress SARS-CoV-2/FLU/RSV testing.  Fact Sheet for Patients: EntrepreneurPulse.com.au  Fact Sheet for Healthcare Providers: IncredibleEmployment.be  This test is not yet approved or  cleared by the [place] FDA and has been authorized for detection and/or diagnosis of SARS-CoV-2 by FDA under an Emergency Use Authorization (EUA). This EUA will remain in effect (meaning this test can be used) for the duration of the COVID-19 declaration under Section 564(b)(1) of the Act, 21 U.S.C. section 360bbb-3(b)(1), unless the authorization is terminated or revoked.  Performed at [facility], [place] [address] [place], [place] [address]     Coagulation Studies: No results for input(s): LABPROT, INR in the last 72 hours.  Urinalysis: No results for input(s): COLORURINE, LABSPEC, PHURINE, GLUCOSEU, HGBUR, BILIRUBINUR, KETONESUR, PROTEINUR, UROBILINOGEN, NITRITE, LEUKOCYTESUR in the last 72 hours.  Invalid input(s): APPERANCEUR    Imaging: No results found.   Medications:   . ferumoxytol     . amLODipine  10 mg Oral Daily  .  aspirin EC  81 mg Oral Daily  . carvedilol  3.125 mg Oral BID  . clopidogrel  75 mg Oral Daily  . ezetimibe  10 mg Oral Daily  . furosemide  80 mg Oral BID  . heparin  5,000 Units Subcutaneous Q8H  . latanoprost  1 drop Both Eyes QHS  . omega-3 acid ethyl esters  1 g Oral Daily  . rosuvastatin  10 mg Oral Daily  . umeclidinium-vilanterol  1 puff Inhalation Daily   acetaminophen **OR** acetaminophen, nitroGLYCERIN  Assessment/ Plan:   Acute on chronic kidney disease: Baseline creatinine 2.5.  Unclear why he had recent rise.  Possible cardiorenal syndrome or progression of his disease   Agree with transition to oral diuretics today    Status post vein mapping, hold off on vascular surgery consult  Note he follows with Dr. Justin Mend - doesn't have follow-up until April which we will adjust  Anemia: Multifactorial with some dilution and iron deficiency.  Status post IV iron and getting one more dose of feraheme.   Volume/hypertension: Continues to be volume overloaded.  Likely due to decreased GFR he will require the higher Lasix  dose.  Patient states his baseline weight is around 90-98kg. Previously on lasix '160mg'$  TID and metolazone 2.'5mg'$ ; agree with transition to oral Lasix 80 mg BID   Asbestosis has seen pulmonology in the past.  Has got significant obstructive and restrictive lung disease.  Congestive heart failure largely preserved EF diuretics as above.  EF 50-55% percent with RV dysfunction.  Cardiology following  Would continue inpatient monitoring of renal function and response to diuretic adjustments.  Will follow with you  Claudia Desanctis, MD 04/16/2020 12:28 PM

## 2020-04-16 NOTE — Care Management Important Message (Signed)
Important Message  Patient Details  Name: Peter Oneill MRN: [id number] Date of Birth: 1940/07/16   Medicare Important Message Given:  Yes     Orbie Pyo 04/16/2020, 4:25 PM

## 2020-04-17 DIAGNOSIS — N1831 Chronic kidney disease, stage 3a: Secondary | ICD-10-CM

## 2020-04-17 LAB — CBC WITH DIFFERENTIAL/PLATELET
Abs Immature Granulocytes: 0.05 10*3/uL (ref 0.00–0.07)
Basophils Absolute: 0 10*3/uL (ref 0.0–0.1)
Basophils Relative: 0 %
Eosinophils Absolute: 0.6 10*3/uL — ABNORMAL HIGH (ref 0.0–0.5)
Eosinophils Relative: 9 %
HCT: 26.6 % — ABNORMAL LOW (ref 39.0–52.0)
Hemoglobin: 9.1 g/dL — ABNORMAL LOW (ref 13.0–17.0)
Immature Granulocytes: 1 %
Lymphocytes Relative: 14 %
Lymphs Abs: 0.9 10*3/uL (ref 0.7–4.0)
MCH: 31.3 pg (ref 26.0–34.0)
MCHC: 34.2 g/dL (ref 30.0–36.0)
MCV: 91.4 fL (ref 80.0–100.0)
Monocytes Absolute: 0.7 10*3/uL (ref 0.1–1.0)
Monocytes Relative: 11 %
Neutro Abs: 4.3 10*3/uL (ref 1.7–7.7)
Neutrophils Relative %: 65 %
Platelets: 123 10*3/uL — ABNORMAL LOW (ref 150–400)
RBC: 2.91 MIL/uL — ABNORMAL LOW (ref 4.22–5.81)
RDW: 15.2 % (ref 11.5–15.5)
WBC: 6.5 10*3/uL (ref 4.0–10.5)
nRBC: 0 % (ref 0.0–0.2)

## 2020-04-17 LAB — RENAL FUNCTION PANEL
Albumin: 2.8 g/dL — ABNORMAL LOW (ref 3.5–5.0)
Anion gap: 13 (ref 5–15)
BUN: 106 mg/dL — ABNORMAL HIGH (ref 8–23)
CO2: 31 mmol/L (ref 22–32)
Calcium: 8.4 mg/dL — ABNORMAL LOW (ref 8.9–10.3)
Chloride: 96 mmol/L — ABNORMAL LOW (ref 98–111)
Creatinine, Ser: 3.8 mg/dL — ABNORMAL HIGH (ref 0.61–1.24)
GFR, Estimated: 16 mL/min — ABNORMAL LOW (ref >60)
Glucose, Bld: 105 mg/dL — ABNORMAL HIGH (ref 70–99)
Phosphorus: 5.1 mg/dL — ABNORMAL HIGH (ref 2.5–4.6)
Potassium: 3.4 mmol/L — ABNORMAL LOW (ref 3.5–5.1)
Sodium: 140 mmol/L (ref 135–145)

## 2020-04-17 MED ORDER — FUROSEMIDE 80 MG PO TABS: 80.0000 mg | ORAL_TABLET | Freq: Two times a day (BID) | ORAL | 0 refills | Status: DC

## 2020-04-17 MED ORDER — POTASSIUM CHLORIDE ER 10 MEQ PO TBCR: 10.0000 meq | EXTENDED_RELEASE_TABLET | Freq: Every day | ORAL | 0 refills | Status: DC

## 2020-04-17 MED ORDER — POTASSIUM CHLORIDE CRYS ER 20 MEQ PO TBCR
20.0000 meq | EXTENDED_RELEASE_TABLET | Freq: Once | ORAL | Status: AC
  Administered 2020-04-17: 20 meq via ORAL
  Filled 2020-04-17: qty 1

## 2020-04-17 NOTE — Progress Notes (Signed)
D/C instructions given and reviewed. Questions asked and answered but encouraged to call with any further concerns. Tele and IV removed, tolerated well. Pt's lunch tray arrived and stated he wanted to eat before he left. Instructed to use call light when finished.

## 2020-04-17 NOTE — Progress Notes (Signed)
[facility] ROUNDING NOTE   Subjective:   had 2.7 liters UOP over 2/21.  He would like to go home today and so would his wife.  States was previously on lasix 40 mg PRN then was increased to 40 mg daily and states then ultimately 80 mg daily which wasn't helping him at all before admission.  Review of systems:   Shortness of breath at baseline due to asbestosis per pt; better now Denies chest pain Denies n/v ------------------ Brief History: 80 year old gentleman who has a history of coronary artery disease status post CABG, also has a history of chronic asbestosis, history of hypertension presents to the emergency room with increasing lower extremity edema and shortness of breath.  His baseline creatinine is about 2.5 mg/dL he was last evaluated in December 2021.  There have been very little change in his renal function.  He is asked to present to the emergency room due to the above and labs that showed an increase in creatinine to 4.2 mg/dL.  Urine study shows 0-5 WBCs 0-5 RBCs per high-powered field.  Last renal ultrasound was performed in August 2020 that was essentially unremarkable.    Objective:  Vital signs in last 24 hours:  Temp:  [97.6 F (36.4 C)-97.9 F (36.6 C)] 97.9 F (36.6 C) (02/22 0555) Pulse Rate:  [70-77] 77 (02/22 0555) Resp:  [18] 18 (02/22 0555) BP: (134-142)/(62-68) 142/68 (02/22 0555) SpO2:  [96 %-97 %] 97 % (02/22 0802) Weight:  [95.5 kg] 95.5 kg (02/22 0551)  Weight change: -1.633 kg Filed Weights   04/15/20 0415 04/16/20 0401 04/17/20 0551  Weight: 99.4 kg 97.1 kg 95.5 kg    Intake/Output: I/O last 3 completed shifts: In: [id number] [P.O.:1554] Out: 4825 [Urine:4825]   Intake/Output this shift:  Total I/O In: 360 [P.O.:360] Out: 200 [Urine:200]  General adult male in bed in no acute distress  HEENT normocephalic atraumatic extraocular movements intact sclera anicteric Neck supple trachea midline Lungs clear to auscultation  bilaterally normal work of breathing at rest  Heart S1S2 no rub Abdomen soft nontender nondistended Extremities no pitting edema  Psych normal mood and affect Neuro alert and oriented; decreased hearing    Basic Metabolic Panel: Recent Labs  Lab 04/11/20 0330 04/12/20 0230 04/13/20 0301 04/14/20 0418 04/15/20 0418 04/16/20 0259 04/17/20 0336  NA 139   < > 137 140 141 140 140  K 4.5   < > 3.6 3.6 3.8 3.5 3.4*  CL 106   < > 106 102 101 100 96*  CO2 19*   < > 21* '22 24 26 31  '$ GLUCOSE 115*   < > 96 101* 96 89 105*  BUN 109*   < > 111* 111* 109* 106* 106*  CREATININE 4.44*   < > 4.18* 4.03* 4.05* 3.79* 3.80*  CALCIUM 8.3*   < > 8.1* 8.5* 8.7* 8.4* 8.4*  MG 2.8*  --   --   --   --   --   --   PHOS  --    < > 5.6* 5.7* 5.3* 5.3* 5.1*   < > = values in this interval not displayed.    Liver Function Tests: Recent Labs  Lab 04/11/20 0330 04/12/20 0230 04/13/20 0301 04/14/20 0418 04/15/20 0418 04/16/20 0259 04/17/20 0336  AST 34  --   --   --   --   --   --   ALT 23  --   --   --   --   --   --  ALKPHOS 66  --   --   --   --   --   --   BILITOT 1.0  --   --   --   --   --   --   PROT 6.0*  --   --   --   --   --   --   ALBUMIN 3.4*   < > 2.8* 2.9* 2.9* 2.9* 2.8*   < > = values in this interval not displayed.   No results for input(s): LIPASE, AMYLASE in the last 168 hours. No results for input(s): AMMONIA in the last 168 hours.  CBC: Recent Labs  Lab 04/10/20 1735 04/11/20 0330 04/14/20 0418 04/17/20 0336  WBC 7.4 7.2 7.3 6.5  NEUTROABS  --   --  5.3 4.3  HGB 10.4* 9.7* 9.6* 9.1*  HCT 31.9* 29.7* 27.3* 26.6*  MCV 93.5 92.2 89.5 91.4  PLT 171 145* 121* 123*    Cardiac Enzymes: No results for input(s): CKTOTAL, CKMB, CKMBINDEX, TROPONINI in the last 168 hours.  BNP: Invalid input(s): POCBNP  CBG: Recent Labs  Lab 04/13/20 2119 04/14/20 0612  GLUCAP 118* 91    Microbiology: Results for orders placed or performed during the hospital encounter of  04/10/20  Resp Panel by RT-PCR (Flu A&B, Covid) Nasopharyngeal Swab     Status: None   Collection Time: 04/10/20 11:38 PM   Specimen: Nasopharyngeal Swab; Nasopharyngeal(NP) swabs in vial transport medium  Result Value Ref Range Status   SARS Coronavirus 2 by RT PCR NEGATIVE NEGATIVE Final    Comment: (NOTE) SARS-CoV-2 target nucleic acids are NOT DETECTED.  The SARS-CoV-2 RNA is generally detectable in upper respiratory specimens during the acute phase of infection. The lowest concentration of SARS-CoV-2 viral copies this assay can detect is 138 copies/mL. A negative result does not preclude SARS-Cov-2 infection and should not be used as the sole basis for treatment or other patient management decisions. A negative result may occur with  improper specimen collection/handling, submission of specimen other than nasopharyngeal swab, presence of viral mutation(s) within the areas targeted by this assay, and inadequate number of viral copies(<138 copies/mL). A negative result must be combined with clinical observations, patient history, and epidemiological information. The expected result is Negative.  Fact Sheet for Patients:  EntrepreneurPulse.com.au  Fact Sheet for Healthcare Providers:  IncredibleEmployment.be  This test is no t yet approved or cleared by the [place] FDA and  has been authorized for detection and/or diagnosis of SARS-CoV-2 by FDA under an Emergency Use Authorization (EUA). This EUA will remain  in effect (meaning this test can be used) for the duration of the COVID-19 declaration under Section 564(b)(1) of the Act, 21 U.S.C.section 360bbb-3(b)(1), unless the authorization is terminated  or revoked sooner.       Influenza A by PCR NEGATIVE NEGATIVE Final   Influenza B by PCR NEGATIVE NEGATIVE Final    Comment: (NOTE) The Xpert Xpress SARS-CoV-2/FLU/RSV plus assay is intended as an aid in the diagnosis of influenza from  Nasopharyngeal swab specimens and should not be used as a sole basis for treatment. Nasal washings and aspirates are unacceptable for Xpert Xpress SARS-CoV-2/FLU/RSV testing.  Fact Sheet for Patients: EntrepreneurPulse.com.au  Fact Sheet for Healthcare Providers: IncredibleEmployment.be  This test is not yet approved or cleared by the [place] FDA and has been authorized for detection and/or diagnosis of SARS-CoV-2 by FDA under an Emergency Use Authorization (EUA). This EUA will remain in effect (meaning this test can  be used) for the duration of the COVID-19 declaration under Section 564(b)(1) of the Act, 21 U.S.C. section 360bbb-3(b)(1), unless the authorization is terminated or revoked.  Performed at [facility], [place] [address], [place],  [address]     Coagulation Studies: No results for input(s): LABPROT, INR in the last 72 hours.  Urinalysis: No results for input(s): COLORURINE, LABSPEC, PHURINE, GLUCOSEU, HGBUR, BILIRUBINUR, KETONESUR, PROTEINUR, UROBILINOGEN, NITRITE, LEUKOCYTESUR in the last 72 hours.  Invalid input(s): APPERANCEUR    Imaging: No results found.   Medications:   . ferumoxytol     . amLODipine  10 mg Oral Daily  . aspirin EC  81 mg Oral Daily  . carvedilol  3.125 mg Oral BID  . clopidogrel  75 mg Oral Daily  . ezetimibe  10 mg Oral Daily  . furosemide  80 mg Oral BID  . heparin  5,000 Units Subcutaneous Q8H  . latanoprost  1 drop Both Eyes QHS  . omega-3 acid ethyl esters  1 g Oral Daily  . rosuvastatin  10 mg Oral Daily  . umeclidinium-vilanterol  1 puff Inhalation Daily   acetaminophen **OR** acetaminophen, nitroGLYCERIN  Assessment/ Plan:   Acute on chronic kidney disease: Baseline creatinine 2.5.  Unclear why he had recent rise.  Possible cardiorenal syndrome or progression of his disease   No acute indication for dialysis however BUN still over 100  Status post vein mapping,  hold off on vascular surgery consult  Note he follows with Dr. Justin Mend - doesn't have follow-up until April which we will adjust  May be able to reduce to lasix 80 mg daily on nephrology or cardiology follow-up per exam and outpatient trends - at this time continue BID dosing  Anemia: Multifactorial with some dilution and iron deficiency.  Status post IV iron and getting one more dose of feraheme.   Volume/hypertension: much improved volume status.  Patient states his baseline weight is around 90-98kg. Previously on aggressive regimen of lasix '160mg'$  IV TID and metolazone 2.'5mg'$ ; agree with transition to oral Lasix 80 mg BID.  Noted high salt diet at home previously   Asbestosis has seen pulmonology in the past.  Has got significant obstructive and restrictive lung disease.  Congestive heart failure largely preserved EF diuretics as above.  EF 50-55% percent with RV dysfunction.  Cardiology has signed off  Hypokalemia - would give potassium 10 meq daily on discharge and assess on follow-up  Patient stable for discharge from a renal standpoint.  Will set up follow-up with [facility] in one week.    Claudia Desanctis, MD 04/17/2020 11:36 AM

## 2020-04-17 NOTE — Plan of Care (Signed)
  Problem: Education: Goal: Knowledge of General Education information will improve Description: Including pain rating scale, medication(s)/side effects and non-pharmacologic comfort measures Outcome: Adequate for Discharge   

## 2020-04-17 NOTE — TOC Transition Note (Signed)
Transition of Care [facility]) - CM/SW Discharge Note   Patient Details  Name: Leverne Coriell MRN: [id number] Date of Birth: 1941-02-18  [facility]) CM/SW Contact:  Zenon Mayo, RN Phone Number: 04/17/2020, 12:09 PM   Clinical Narrative:    NCM spoke with patient, he is for dc today,he has the walker in the room.  He does not want any HH services. He has no issues with getting medications or transportation.    Final next level of care: Home/Self Care Barriers to Discharge: No Barriers Identified   Patient Goals and CMS Choice Patient states their goals for this hospitalization and ongoing recovery are:: get better   Choice offered to / list presented to : NA  Discharge Placement                       Discharge Plan and Services                DME Arranged: Walker rolling DME Agency: [employer] Date DME Agency Contacted: 04/17/20 Time DME Agency Contacted: [id number] Representative spoke with at DME Agency: [place]: NA          Social Determinants of Health ([place]) Interventions     Readmission Risk Interventions No flowsheet data found.

## 2020-04-17 NOTE — Discharge Summary (Signed)
Physician Discharge Summary  Peter Oneill [id number] DOB: Jul 06, 1940 DOA: 04/10/2020  PCP: [name], MD  Admit date: 04/10/2020 Discharge date: 04/17/2020  Admitted From: Home Disposition: Home  Recommendations for Outpatient Follow-up:  1. Follow up with PCP in 1 week with repeat CBC/BMP 2. Outpatient follow-up with nephrology and cardiology 3. Follow up in ED if symptoms worsen or new appear   Home Health: No Equipment/Devices: None  Discharge Condition: Guarded CODE STATUS: Full Diet recommendation: Heart healthy/fluid restriction of up to 1200 cc a day  Brief/Interim Summary: 80 year old male with history of CAD status post stenting and CABG, asbestosis, hypertension, CKD stage IIIa, hypertension and OSA presented with worsening lower extremity edema and shortness of breath.  On presentation, he was found to have creatinine of 4.23 with baseline creatinine of 2.6 along with BNP of 736.9.  Chest x-ray showed no cardiopulmonary findings.  He was started on intravenous Lasix.  Cardiology and nephrology were consulted.  During the hospitalization, his condition has improved.  Cardiology has signed off.  He has been transitioned to oral diuretics.  Nephrology has cleared the patient for discharge on oral Lasix.  He will be discharged home today with outpatient follow-up with cardiology and nephrology.  Discharge Diagnoses:   Acute kidney injury on chronic kidney disease stage IIIa -he was found to have creatinine of 4.23 with baseline creatinine of 2.6 -he was started on intravenous Lasix.  Cardiology and nephrology were consulted.  During the hospitalization, his condition has improved.  Cardiology has signed off.  He has been transitioned to oral diuretics.  Nephrology has cleared the patient for discharge on oral Lasix 80 mg twice a day.  He will be discharged home today with outpatient follow-up with cardiology and nephrology. -He will need a repeat BMP followed up by  PCP/nephrology within a week  Acute on chronic diastolic heart failure -Echo showed EF of 50 to 55% with grade 2 diastolic dysfunction -Patient has diuresed well during this hospitalization.  Cardiology has signed off.  Lasix as above.  Continue Coreg on discharge.  Hydralazine has been held during this hospitalization.  Outpatient follow-up with cardiology  Hypertension -Blood pressure stable.  Antihypertensives as above.  Continue amlodipine as well  History of CAD -No angina.  Continue aspirin, Plavix, statin and beta-blocker  History of asbestosis -Currently on room air.  Outpatient follow-up with pulmonary resume diuretics  Discharge Instructions  Discharge Instructions    Ambulatory referral to Cardiology   Complete by: As directed    Diet - low sodium heart healthy   Complete by: As directed    Fluid restriction of up to 1200 cc a day   Increase activity slowly   Complete by: As directed    No wound care   Complete by: As directed      Allergies as of 04/17/2020      Reactions   Atorvastatin Other (See Comments)   Muscle aches      Medication List    STOP taking these medications   allopurinol 300 MG tablet Commonly known as: ZYLOPRIM   hydrALAZINE 25 MG tablet Commonly known as: APRESOLINE   silodosin 4 MG Caps capsule Commonly known as: RAPAFLO     TAKE these medications   amLODipine 10 MG tablet Commonly known as: NORVASC Take 1 tablet (10 mg total) by mouth daily.   Anoro Ellipta 62.5-25 MCG/INH Aepb Generic drug: umeclidinium-vilanterol USE 1 INHALATION ONCE DAILY What changed:   how much to take  how to take  this  when to take this   aspirin EC 81 MG tablet Take 81 mg by mouth daily.   carvedilol 3.125 MG tablet Commonly known as: COREG Take 1 tablet (3.125 mg total) by mouth 2 (two) times daily.   cholecalciferol 25 MCG (1000 UNIT) tablet Commonly known as: VITAMIN D3 Take 1,000 Units by mouth daily.   clopidogrel 75 MG  tablet Commonly known as: PLAVIX Take 75 mg by mouth daily.   ezetimibe 10 MG tablet Commonly known as: ZETIA Take 10 mg by mouth daily.   FISH OIL PO Take 2,000 mg by mouth daily.   furosemide 80 MG tablet Commonly known as: LASIX Take 1 tablet (80 mg total) by mouth 2 (two) times daily. What changed:   medication strength  how much to take  when to take this   latanoprost 0.005 % ophthalmic solution Commonly known as: XALATAN Place 1 drop into both eyes at bedtime.   multivitamin tablet Take 1 tablet by mouth daily.   nitroGLYCERIN 0.4 MG SL tablet Commonly known as: NITROSTAT Place 1 tablet (0.4 mg total) under the tongue every 5 (five) minutes as needed for chest pain.   pantoprazole 40 MG tablet Commonly known as: PROTONIX TAKE 1 TABLET BY MOUTH  DAILY AS NEEDED What changed: reasons to take this   potassium chloride 10 MEQ tablet Commonly known as: KLOR-CON Take 1 tablet (10 mEq total) by mouth daily.   Rosuvastatin Calcium 10 MG Cpsp Take 10 mg by mouth daily.            Durable Medical Equipment  (From admission, onward)         Start     Ordered   04/12/20 1151  For home use only DME Walker rolling  Once       Question Answer Comment  Walker: With 5 Inch Wheels   Patient needs a walker to treat with the following condition Weakness      04/12/20 [facility] Follow up.   Why: rolling walker Contact information: [address] [place] [address] [phone]        [name], MD. Schedule an appointment as soon as possible for a visit in 1 week(s).   Specialty: Family Medicine Why: With repeat CBC/BMP Contact information: [place] [address] [phone]        [name], MD .   Specialties: Cardiology, Radiology, Interventional Cardiology Contact information: [id number] [place] [address] [phone]         Edrick Oh, MD. Schedule an appointment as soon as possible for a visit in 1 week(s).   Specialty: Nephrology Contact information: [place] [address] [phone]              Allergies  Allergen Reactions  . Atorvastatin Other (See Comments)    Muscle aches    Consultations:  Cardiology/nephrology   Procedures/Studies: DG Chest 2 View  Result Date: 04/10/2020 CLINICAL DATA:  Short of breath.  Lower extremity edema EXAM: CHEST - 2 VIEW COMPARISON:  01/18/2019 FINDINGS: Sternotomy wires overlie normal cardiac silhouette. There is chronic pleural thickening with calcifications and basilar scarring. No focal consolidation. No pulmonary edema. No pneumothorax. No acute osseous abnormality. IMPRESSION: 1. No clear acute cardiopulmonary findings. 2. Chronic pleural thickening and scarring at the lung bases. Electronically Signed   By: Suzy Bouchard  M.D.   On: 04/10/2020 18:29   US RENAL  Result Date: 04/11/2020 CLINICAL DATA:  Acute kidney failure. EXAM: RENAL / URINARY TRACT ULTRASOUND COMPLETE COMPARISON:  October 14, 2018. FINDINGS: Right Kidney: Renal measurements: 10.4 x 4.5 x 4.4 cm = volume: 108 mL. Increased echogenicity of renal parenchyma is noted suggesting medical renal disease. Cortical thinning is noted. No mass or hydronephrosis visualized. Left Kidney: Renal measurements: 10.8 x 4.4 x 4.8 cm = volume: 146 mL. Increased echogenicity of renal parenchyma is noted. Cortical thinning is noted. No mass or hydronephrosis visualized. Bladder: Not well visualized as patient recently voided. Other: Mild ascites is noted. IMPRESSION: Increased echogenicity of renal parenchyma is noted suggesting medical renal disease. No hydronephrosis or renal obstruction is noted. Bilateral renal cortical thinning is noted as well. Mild ascites is noted Electronically Signed   By: Marijo Conception M.D.   On: 04/11/2020 11:42   ECHOCARDIOGRAM COMPLETE  Result Date: 04/11/2020     ECHOCARDIOGRAM REPORT   Patient Name:   Peter Oneill Date of Exam: 04/11/2020 Medical Rec #:  [id number]        Height:       69.0 in Accession #:    [id number]       Weight:       234.0 lb Date of Birth:  [birth year]         BSA:          2.209 m Patient Age:    81 years         BP:           139/81 mmHg Patient Gender: M                HR:           77 bpm. Exam Location:  Inpatient Procedure: 2D Echo, Cardiac Doppler, Color Doppler and Intracardiac            Opacification Agent Indications:    I50.33 Acute on chronic diastolic (congestive) heart failure  History:        Patient has prior history of Echocardiogram examinations, most                 recent 01/29/2015. CAD; Risk Factors:Sleep Apnea, Dyslipidemia                 and Hypertension.  Sonographer:    Tiffany Dance Referring Phys: 63 Rise Patience  Sonographer Comments: Technically difficult study due to poor echo windows, no subcostal window and suboptimal parasternal window. IMPRESSIONS  1. Left ventricular ejection fraction, by estimation, is 50 to 55%. The left ventricle has low normal function. The left ventricle demonstrates regional wall motion abnormalities. Anteroseptal hypokinesis. There is mild left ventricular hypertrophy. Left ventricular diastolic parameters are indeterminate.  2. Right ventricular systolic function is mildly reduced. The right ventricular size is mildly enlarged. Tricuspid regurgitation signal is inadequate for assessing PA pressure.  3. The mitral valve is grossly normal. No evidence of mitral valve regurgitation.  4. The aortic valve was not well visualized. Aortic valve regurgitation is not visualized. No aortic stenosis is present. FINDINGS  Left Ventricle: Left ventricular ejection fraction, by estimation, is 50 to 55%. The left ventricle has low normal function. The left ventricle demonstrates regional wall motion abnormalities. Definity contrast agent was given IV to delineate the left ventricular  endocardial borders. The left ventricular internal cavity size was normal in size. There is mild left ventricular hypertrophy. Left ventricular diastolic parameters are  indeterminate. Right Ventricle: The right ventricular size is mildly enlarged. Right vetricular wall thickness was not well visualized. Right ventricular systolic function is mildly reduced. Tricuspid regurgitation signal is inadequate for assessing PA pressure. Left Atrium: Left atrial size was normal in size. Right Atrium: Right atrial size was normal in size. Pericardium: There is no evidence of pericardial effusion. Mitral Valve: The mitral valve is grossly normal. No evidence of mitral valve regurgitation. Tricuspid Valve: The tricuspid valve is grossly normal. Tricuspid valve regurgitation is not demonstrated. Aortic Valve: The aortic valve was not well visualized. Aortic valve regurgitation is not visualized. No aortic stenosis is present. Pulmonic Valve: The pulmonic valve was not well visualized. Pulmonic valve regurgitation is not visualized. Aorta: The aortic root and ascending aorta are structurally normal, with no evidence of dilitation. IAS/Shunts: The interatrial septum was not well visualized.  LEFT VENTRICLE PLAX 2D LVIDd:         4.40 cm Diastology LVIDs:         3.60 cm LV e' lateral:   12.10 cm/s LV PW:         1.30 cm LV E/e' lateral: 9.8 LV IVS:        1.00 cm  RIGHT VENTRICLE RV Basal diam:  3.60 cm RV Mid diam:    2.50 cm RV S prime:     6.64 cm/s TAPSE (M-mode): 1.5 cm LEFT ATRIUM             Index       RIGHT ATRIUM           Index LA diam:        5.60 cm 2.54 cm/m  RA Area:     21.20 cm LA Vol (A2C):   70.4 ml 31.88 ml/m RA Volume:   64.30 ml  29.11 ml/m LA Vol (A4C):   45.4 ml 20.56 ml/m LA Biplane Vol: 56.5 ml 25.58 ml/m  AORTIC VALVE LVOT Vmax:   106.00 cm/s LVOT Vmean:  75.300 cm/s LVOT VTI:    0.242 m  AORTA Ao Asc diam: 3.50 cm MITRAL VALVE MV Area (PHT): 2.69 cm     SHUNTS MV Decel Time: 282 msec     Systemic  VTI: 0.24 m MV E velocity: 118.00 cm/s MV A velocity: 140.00 cm/s MV E/A ratio:  0.84 Oswaldo Milian MD Electronically signed by Oswaldo Milian MD Signature Date/Time: 04/11/2020/4:35:19 PM    Final    VAS US CAROTID  Result Date: 04/04/2020 Carotid Arterial Duplex Study Indications:       Right endarterectomy and h/o bilateral carotid artery                    stenosis. Patient denies any cerebrovascular symptoms. Risk Factors:      None, hypertension, past history of smoking, coronary artery                    disease. Other Factors:     History of right carotid endarterectomy. CABG in 2003. Comparison Study:  In 03/2019, a carotid duplex showed velocities of 147/40 cm/s                    in the RICA and 123XX123 cm/s in the LICA. Performing Technologist: Sharlett Iles RVT  Examination Guidelines: A complete evaluation includes B-mode imaging, spectral Doppler, color Doppler, and power Doppler as needed of all accessible portions of each vessel. Bilateral testing is considered an integral part of a complete examination. Limited examinations  for reoccurring indications may be performed as noted.  Right Carotid Findings: +----------+--------+--------+--------+------------------+---------------------+           PSV cm/sEDV cm/sStenosisPlaque DescriptionComments              +----------+--------+--------+--------+------------------+---------------------+ CCA Prox  119     18                                                      +----------+--------+--------+--------+------------------+---------------------+ CCA Distal136     23              heterogenous                            +----------+--------+--------+--------+------------------+---------------------+ ICA Prox  285     27      40-59%  heterogenous      turbulent, stenosis                                                       based on PSV and                                                          plaque  formation      +----------+--------+--------+--------+------------------+---------------------+ ICA Mid   238     48                                turbulent             +----------+--------+--------+--------+------------------+---------------------+ ICA Distal81      31                                turbulent             +----------+--------+--------+--------+------------------+---------------------+ ECA       353     19      >50%    heterogenous                            +----------+--------+--------+--------+------------------+---------------------+ +----------+--------+-------+----------------------+-------------------+           PSV cm/sEDV cmsDescribe              Arm Pressure (mmHG) +----------+--------+-------+----------------------+-------------------+ [id number]            Stenotic and Turbulent148                 +----------+--------+-------+----------------------+-------------------+ +---------+--------+--+--------+-+---------+ VertebralPSV cm/s36EDV cm/s9Antegrade +---------+--------+--+--------+-+---------+  Left Carotid Findings: +----------+--------+--------+--------+------------------+---------+           PSV cm/sEDV cm/sStenosisPlaque DescriptionComments  +----------+--------+--------+--------+------------------+---------+ CCA Prox  124     15                                          +----------+--------+--------+--------+------------------+---------+  CCA Distal114     18                                          +----------+--------+--------+--------+------------------+---------+ ICA Prox  96      21              heterogenous                +----------+--------+--------+--------+------------------+---------+ ICA Mid   255     51      40-59%  heterogenous      turbulent +----------+--------+--------+--------+------------------+---------+ ICA Distal139     23                                turbulent  +----------+--------+--------+--------+------------------+---------+ ECA       313     14      >50%    heterogenous                +----------+--------+--------+--------+------------------+---------+ +----------+--------+--------+----------------------+-------------------+           PSV cm/sEDV cm/sDescribe              Arm Pressure (mmHG) +----------+--------+--------+----------------------+-------------------+ Subclavian192             Stenotic and Turbulent151                 +----------+--------+--------+----------------------+-------------------+ +---------+--------+--+--------+--+---------+ VertebralPSV cm/s60EDV cm/s14Antegrade +---------+--------+--+--------+--+---------+   Summary: Right Carotid: Velocities in the right ICA are consistent with a 40-59%                stenosis. Velocities in the RICA have increased compared to prior                exam. Stenosis based on PSV and plaque formation, s/p                endarterectomy. Left Carotid: Velocities in the left ICA are consistent with a 40-59% stenosis.               Velocities in the LICA have increased compared to prior exam. Vertebrals:  Bilateral vertebral arteries demonstrate antegrade flow. Subclavians: Bilateral subclavian arteries were stenotic. Bilateral subclavian              artery flow was disturbed. *See table(s) above for measurements and observations. Suggest follow up study in 12 months. Electronically signed by Kathlyn Sacramento MD on 04/04/2020 at 35:54:43 AM.    Final    VAS [place] UPPER EXT VEIN MAPPING (PRE-OP AVF)  Result Date: 04/12/2020 UPPER EXTREMITY VEIN MAPPING  Indications: Pre-access. Comparison Study: No previous Performing Technologist: Vonzell Schlatter RVT  Examination Guidelines: A complete evaluation includes B-mode imaging, spectral Doppler, color Doppler, and power Doppler as needed of all accessible portions of each vessel. Bilateral testing is considered an integral part of a complete examination.  Limited examinations for reoccurring indications may be performed as noted. +-----------------+-------------+----------+--------+ Right Cephalic   Diameter (cm)Depth (cm)Findings +-----------------+-------------+----------+--------+ Shoulder             0.38        1.20            +-----------------+-------------+----------+--------+ Prox upper arm       0.41        0.64            +-----------------+-------------+----------+--------+ Mid upper arm  0.45        0.35            +-----------------+-------------+----------+--------+ Dist upper arm       0.25        0.45            +-----------------+-------------+----------+--------+ Antecubital fossa    0.44        0.15   thrombus +-----------------+-------------+----------+--------+ Prox forearm         0.40        0.41            +-----------------+-------------+----------+--------+ Mid forearm          0.34        0.37            +-----------------+-------------+----------+--------+ Dist forearm         0.42        0.42            +-----------------+-------------+----------+--------+ Wrist                0.20        0.50            +-----------------+-------------+----------+--------+ +-----------------+-------------+----------+--------+ Left Cephalic    Diameter (cm)Depth (cm)Findings +-----------------+-------------+----------+--------+ Shoulder             0.40        0.33            +-----------------+-------------+----------+--------+ Prox upper arm       0.40        0.33            +-----------------+-------------+----------+--------+ Mid upper arm        0.51        0.19            +-----------------+-------------+----------+--------+ Dist upper arm       0.42        0.17            +-----------------+-------------+----------+--------+ Antecubital fossa    0.51        0.21            +-----------------+-------------+----------+--------+ Prox forearm         0.26         0.47            +-----------------+-------------+----------+--------+ Mid forearm          0.25        0.38            +-----------------+-------------+----------+--------+ Dist forearm         0.23        0.31            +-----------------+-------------+----------+--------+ Wrist                0.20        0.36            +-----------------+-------------+----------+--------+ +-----------------+-------------+----------+---------+ Left Basilic     Diameter (cm)Depth (cm)Findings  +-----------------+-------------+----------+---------+ Prox upper arm       0.43        0.95             +-----------------+-------------+----------+---------+ Mid upper arm        0.44        0.62             +-----------------+-------------+----------+---------+ Dist upper arm       0.44        0.49             +-----------------+-------------+----------+---------+ Antecubital  fossa    0.36        0.37   branching +-----------------+-------------+----------+---------+ Prox forearm         0.26        0.22             +-----------------+-------------+----------+---------+ Mid forearm          0.23        0.20             +-----------------+-------------+----------+---------+ Distal forearm       0.15        0.25             +-----------------+-------------+----------+---------+ *See table(s) above for measurements and observations.  Diagnosing physician: Monica Martinez MD Electronically signed by Monica Martinez MD on 04/12/2020 at 4:33:44 PM.    Final        Subjective: Patient seen and examined at bedside.  He feels much better and wants to go home.  No overnight fever, vomiting, worsening shortness with or chest pain.  Discharge Exam: Vitals:   04/17/20 0802 04/17/20 1120  BP:  130/62  Pulse:  74  Resp:  17  Temp:  98.1 F (36.7 C)  SpO2: 97% 95%    General: Pt is alert, awake, not in acute distress.  Currently on room air Cardiovascular: rate controlled,  S1/S2 + Respiratory: bilateral decreased breath sounds at bases with bibasilar crackles Abdominal: Soft, NT, ND, bowel sounds + Extremities: Trace lower extremity edema; no cyanosis    The results of significant diagnostics from this hospitalization (including imaging, microbiology, ancillary and laboratory) are listed below for reference.     Microbiology: Recent Results (from the past 240 hour(s))  Resp Panel by RT-PCR (Flu A&B, Covid) Nasopharyngeal Swab     Status: None   Collection Time: 04/10/20 11:38 PM   Specimen: Nasopharyngeal Swab; Nasopharyngeal(NP) swabs in vial transport medium  Result Value Ref Range Status   SARS Coronavirus 2 by RT PCR NEGATIVE NEGATIVE Final    Comment: (NOTE) SARS-CoV-2 target nucleic acids are NOT DETECTED.  The SARS-CoV-2 RNA is generally detectable in upper respiratory specimens during the acute phase of infection. The lowest concentration of SARS-CoV-2 viral copies this assay can detect is 138 copies/mL. A negative result does not preclude SARS-Cov-2 infection and should not be used as the sole basis for treatment or other patient management decisions. A negative result may occur with  improper specimen collection/handling, submission of specimen other than nasopharyngeal swab, presence of viral mutation(s) within the areas targeted by this assay, and inadequate number of viral copies(<138 copies/mL). A negative result must be combined with clinical observations, patient history, and epidemiological information. The expected result is Negative.  Fact Sheet for Patients:  EntrepreneurPulse.com.au  Fact Sheet for Healthcare Providers:  IncredibleEmployment.be  This test is no t yet approved or cleared by the [place] FDA and  has been authorized for detection and/or diagnosis of SARS-CoV-2 by FDA under an Emergency Use Authorization (EUA). This EUA will remain  in effect (meaning this test can be  used) for the duration of the COVID-19 declaration under Section 564(b)(1) of the Act, 21 U.S.C.section 360bbb-3(b)(1), unless the authorization is terminated  or revoked sooner.       Influenza A by PCR NEGATIVE NEGATIVE Final   Influenza B by PCR NEGATIVE NEGATIVE Final    Comment: (NOTE) The Xpert Xpress SARS-CoV-2/FLU/RSV plus assay is intended as an aid in the diagnosis of influenza from Nasopharyngeal swab specimens and should not be  used as a sole basis for treatment. Nasal washings and aspirates are unacceptable for Xpert Xpress SARS-CoV-2/FLU/RSV testing.  Fact Sheet for Patients: EntrepreneurPulse.com.au  Fact Sheet for Healthcare Providers: IncredibleEmployment.be  This test is not yet approved or cleared by the [place] FDA and has been authorized for detection and/or diagnosis of SARS-CoV-2 by FDA under an Emergency Use Authorization (EUA). This EUA will remain in effect (meaning this test can be used) for the duration of the COVID-19 declaration under Section 564(b)(1) of the Act, 21 U.S.C. section 360bbb-3(b)(1), unless the authorization is terminated or revoked.  Performed at [facility], [place] [address], [place], [place] [address]      Labs: BNP (last 3 results) Recent Labs    04/10/20 1735  BNP [id number]*   Basic Metabolic Panel: Recent Labs  Lab 04/11/20 0330 04/12/20 0230 04/13/20 0301 04/14/20 0418 04/15/20 0418 04/16/20 0259 04/17/20 0336  NA 139   < > 137 140 141 140 140  K 4.5   < > 3.6 3.6 3.8 3.5 3.4*  CL 106   < > 106 102 101 100 96*  CO2 19*   < > 21* '22 24 26 31  '$ GLUCOSE 115*   < > 96 101* 96 89 105*  BUN 109*   < > 111* 111* 109* 106* 106*  CREATININE 4.44*   < > 4.18* 4.03* 4.05* 3.79* 3.80*  CALCIUM 8.3*   < > 8.1* 8.5* 8.7* 8.4* 8.4*  MG 2.8*  --   --   --   --   --   --   PHOS  --    < > 5.6* 5.7* 5.3* 5.3* 5.1*   < > = values in this interval not displayed.   Liver Function  Tests: Recent Labs  Lab 04/11/20 0330 04/12/20 0230 04/13/20 0301 04/14/20 0418 04/15/20 0418 04/16/20 0259 04/17/20 0336  AST 34  --   --   --   --   --   --   ALT 23  --   --   --   --   --   --   ALKPHOS 66  --   --   --   --   --   --   BILITOT 1.0  --   --   --   --   --   --   PROT 6.0*  --   --   --   --   --   --   ALBUMIN 3.4*   < > 2.8* 2.9* 2.9* 2.9* 2.8*   < > = values in this interval not displayed.   No results for input(s): LIPASE, AMYLASE in the last 168 hours. No results for input(s): AMMONIA in the last 168 hours. CBC: Recent Labs  Lab 04/10/20 1735 04/11/20 0330 04/14/20 0418 04/17/20 0336  WBC 7.4 7.2 7.3 6.5  NEUTROABS  --   --  5.3 4.3  HGB 10.4* 9.7* 9.6* 9.1*  HCT 31.9* 29.7* 27.3* 26.6*  MCV 93.5 92.2 89.5 91.4  PLT 171 145* 121* 123*   Cardiac Enzymes: No results for input(s): CKTOTAL, CKMB, CKMBINDEX, TROPONINI in the last 168 hours. BNP: Invalid input(s): POCBNP CBG: Recent Labs  Lab 04/13/20 2119 04/14/20 0612  GLUCAP 118* 91   D-Dimer No results for input(s): DDIMER in the last 72 hours. Hgb A1c No results for input(s): HGBA1C in the last 72 hours. Lipid Profile No results for input(s): CHOL, HDL, LDLCALC, TRIG, CHOLHDL, LDLDIRECT in the last  72 hours. Thyroid function studies No results for input(s): TSH, T4TOTAL, T3FREE, THYROIDAB in the last 72 hours.  Invalid input(s): FREET3 Anemia work up No results for input(s): VITAMINB12, FOLATE, FERRITIN, TIBC, IRON, RETICCTPCT in the last 72 hours. Urinalysis    Component Value Date/Time   COLORURINE YELLOW 04/11/2020 0152   APPEARANCEUR CLEAR 04/11/2020 0152   LABSPEC 1.011 04/11/2020 0152   PHURINE 5.0 04/11/2020 0152   GLUCOSEU NEGATIVE 04/11/2020 0152   HGBUR NEGATIVE 04/11/2020 0152   BILIRUBINUR NEGATIVE 04/11/2020 0152   KETONESUR NEGATIVE 04/11/2020 0152   PROTEINUR 100 (A) 04/11/2020 0152   NITRITE NEGATIVE 04/11/2020 0152   LEUKOCYTESUR NEGATIVE 04/11/2020 0152    Sepsis Labs Invalid input(s): PROCALCITONIN,  WBC,  LACTICIDVEN Microbiology Recent Results (from the past 240 hour(s))  Resp Panel by RT-PCR (Flu A&B, Covid) Nasopharyngeal Swab     Status: None   Collection Time: 04/10/20 11:38 PM   Specimen: Nasopharyngeal Swab; Nasopharyngeal(NP) swabs in vial transport medium  Result Value Ref Range Status   SARS Coronavirus 2 by RT PCR NEGATIVE NEGATIVE Final    Comment: (NOTE) SARS-CoV-2 target nucleic acids are NOT DETECTED.  The SARS-CoV-2 RNA is generally detectable in upper respiratory specimens during the acute phase of infection. The lowest concentration of SARS-CoV-2 viral copies this assay can detect is 138 copies/mL. A negative result does not preclude SARS-Cov-2 infection and should not be used as the sole basis for treatment or other patient management decisions. A negative result may occur with  improper specimen collection/handling, submission of specimen other than nasopharyngeal swab, presence of viral mutation(s) within the areas targeted by this assay, and inadequate number of viral copies(<138 copies/mL). A negative result must be combined with clinical observations, patient history, and epidemiological information. The expected result is Negative.  Fact Sheet for Patients:  EntrepreneurPulse.com.au  Fact Sheet for Healthcare Providers:  IncredibleEmployment.be  This test is no t yet approved or cleared by the [place] FDA and  has been authorized for detection and/or diagnosis of SARS-CoV-2 by FDA under an Emergency Use Authorization (EUA). This EUA will remain  in effect (meaning this test can be used) for the duration of the COVID-19 declaration under Section 564(b)(1) of the Act, 21 U.S.C.section 360bbb-3(b)(1), unless the authorization is terminated  or revoked sooner.       Influenza A by PCR NEGATIVE NEGATIVE Final   Influenza B by PCR NEGATIVE NEGATIVE Final     Comment: (NOTE) The Xpert Xpress SARS-CoV-2/FLU/RSV plus assay is intended as an aid in the diagnosis of influenza from Nasopharyngeal swab specimens and should not be used as a sole basis for treatment. Nasal washings and aspirates are unacceptable for Xpert Xpress SARS-CoV-2/FLU/RSV testing.  Fact Sheet for Patients: EntrepreneurPulse.com.au  Fact Sheet for Healthcare Providers: IncredibleEmployment.be  This test is not yet approved or cleared by the [place] FDA and has been authorized for detection and/or diagnosis of SARS-CoV-2 by FDA under an Emergency Use Authorization (EUA). This EUA will remain in effect (meaning this test can be used) for the duration of the COVID-19 declaration under Section 564(b)(1) of the Act, 21 U.S.C. section 360bbb-3(b)(1), unless the authorization is terminated or revoked.  Performed at [facility], [place] [address] [place], [place] [address]      Time coordinating discharge: 35 minutes  SIGNED:   [name], MD  [facility] 04/17/2020, 11:54 AM

## 2020-05-18 ENCOUNTER — Other Ambulatory Visit: Payer: Self-pay | Admitting: *Deleted

## 2020-05-18 ENCOUNTER — Ambulatory Visit
Admission: RE | Admit: 2020-05-18 | Discharge: 2020-05-18 | Disposition: A | Discharge status: Home / Self Care | Payer: Self-pay | Source: Ambulatory Visit | Attending: Pulmonary Disease | Admitting: Pulmonary Disease

## 2020-05-18 DIAGNOSIS — J920 Pleural plaque with presence of asbestos: Secondary | ICD-10-CM

## 2020-05-19 NOTE — Progress Notes (Signed)
Cardiology Office Note   Date:  05/21/2020   ID:  Peter Oneill, DOB 1940/11/02, MRN [id number]  PCP:  Peter Lass, MD    No chief complaint on file.  CAD  Wt Readings from Last 3 Encounters:  05/21/20 203 lb 12.8 oz (92.4 kg)  04/17/20 210 lb 8 oz (95.5 kg)  02/09/20 218 lb 9.6 oz (99.2 kg)       History of Present Illness: Peter Oneill is a 80 y.o. male    Who has had extensive CAD.3.5 x 13 Cypher, and 3.5 x 18 Cypher in 2003. He thenhad CABG in 2003 after having burning in his chest, which occurred with walking/hunting. A few years later, he required several stents, (2004; 3.5 x 18 Cypher). Details are not available at this time. He required repeat CABG in 12/16, with RIMA to LAD and SVG to diagonal. Last cath was 10/17 in which a stent was placed. THat cath was done at [place], [place].   Additional anatomic information from prior records from 2015 cath: LIMA to LAD is atretic. LAD filled by SVG to diagonal prior to second bypass surgery in 2016. SVG to circumflex is occluded. This information comes from a cardiac cath report from 2015. At that timein 2015, the RCA was stented with a 3.5 x 12, and 3.0 x 8Xience drug-eluting stent. 2015 , he had 3.5 x 15 Xience to the SVG to diagonal.  He also has a 3.5 x 22 resolute stent in the circumflex.3.0 x 12 Onyx in the Circ, 2.5 x 12 in the OM (2017)  He had CEA in 1996  On 12/25/2018, he had persistent chest burning that lasted a fewminutes. He did not use NTG or seek attention in the ER. He has not had issues with walking or mowing the lawn. It occurred while he would lie on his right side, and resolved when he changed positions.  He fell in the shower around Thanksgiving 2020. He had a big bruise.   We had tried Protonix for the chest burning that he had on the right side. It worked well and there was no residual burning. He still has some residual back pain from the fall, but it improved.   He  was admitted with ARF in 2/22.  He had severe volume overload, 30 lbs weight gain in a few weeks.  Cr went to 4.3 from baseline of 2.6.   Came down to 3.8 at discharge.    Walks some outdoors.  Wants to do water aerobics.  Uses a stationary bike as well.   Denies : Chest pain. Dizziness. Leg edema. Nitroglycerin use. Orthopnea. Palpitations. Paroxysmal nocturnal dyspnea.  Syncope.    Past Medical History:  Diagnosis Date  . Asbestosis ([place])   . Contraindication to percutaneous coronary intervention (PCI)   . Coronary artery disease   . Dyslipidemia   . Hypercholesterolemia   . Hypertension   . Ileus ([place])   . Pleural effusion on left   . Renal failure   . Sleep apnea     Past Surgical History:  Procedure Laterality Date  . BACK SURGERY    . CARDIAC CATHETERIZATION    . CAROTID ENDARTERECTOMY    . CARPAL TUNNEL RELEASE    . CORONARY ARTERY BYPASS GRAFT    . HERNIA REPAIR    . PERCUTANEOUS CORONARY STENT INTERVENTION (PCI-S)     multiple  . STERNOTOMY       Current Outpatient Medications  Medication Sig Dispense Refill  .  amLODipine (NORVASC) 10 MG tablet Take 1 tablet (10 mg total) by mouth daily. 90 tablet 3  . ANORO ELLIPTA 62.5-25 MCG/INH AEPB USE 1 INHALATION ONCE DAILY (Patient taking differently: Inhale 1 puff into the lungs daily. USE 1 INHALATION ONCE DAILY) 180 each 3  . aspirin EC 81 MG tablet Take 81 mg by mouth daily.    . carvedilol (COREG) 3.125 MG tablet Take 1 tablet (3.125 mg total) by mouth 2 (two) times daily. 180 tablet 3  . clopidogrel (PLAVIX) 75 MG tablet Take 75 mg by mouth daily.    Marland Kitchen ezetimibe (ZETIA) 10 MG tablet Take 10 mg by mouth daily.    . furosemide (LASIX) 80 MG tablet Take 1 tablet (80 mg total) by mouth 2 (two) times daily. 60 tablet 0  . latanoprost (XALATAN) 0.005 % ophthalmic solution Place 1 drop into both eyes at bedtime.    . Multiple Vitamin (MULTIVITAMIN) tablet Take 1 tablet by mouth daily.    . nitroGLYCERIN (NITROSTAT) 0.4  MG SL tablet Place 1 tablet (0.4 mg total) under the tongue every 5 (five) minutes as needed for chest pain. 25 tablet 3  . Omega-3 Fatty Acids (FISH OIL PO) Take 2,000 mg by mouth daily.    . pantoprazole (PROTONIX) 40 MG tablet TAKE 1 TABLET BY MOUTH  DAILY AS NEEDED (Patient taking differently: Take 40 mg by mouth daily as needed Jerrye Bushy).) 90 tablet 3  . Rosuvastatin Calcium 10 MG CPSP Take 10 mg by mouth daily.    . silodosin (RAPAFLO) 4 MG CAPS capsule Take 4 mg by mouth daily with breakfast.     No current facility-administered medications for this visit.    Allergies:   Atorvastatin    Social History:  The patient  reports that he quit smoking about 43 years ago. His smoking use included cigarettes. He has a 4.75 pack-year smoking history. He has never used smokeless tobacco. He reports current alcohol use. He reports that he does not use drugs.   Family History:  The patient's family history includes Heart disease in his father.    ROS:  Please see the history of present illness.   Otherwise, review of systems are positive for swelling much improved.   All other systems are reviewed and negative.    PHYSICAL EXAM: VS:  BP (!) 144/66   Pulse 86   Ht '5\' 9"'$  (1.753 m)   Wt 203 lb 12.8 oz (92.4 kg)   SpO2 98%   BMI 30.10 kg/m  , BMI Body mass index is 30.1 kg/m. GEN: Well nourished, well developed, in no acute distress  HEENT: normal  Neck: no JVD, ; bilateral carotid bruits,; no masses Cardiac: RRR; no murmurs, rubs, or gallops,no edema  Respiratory:  clear to auscultation bilaterally, normal work of breathing GI: soft, nontender, nondistended, + BS, obse MS: no deformity or atrophy  Skin: warm and dry, no rash Neuro:  Strength and sensation are intact Psych: euthymic mood, full affect   EKG:   The ekg ordered today demonstrates NSR, nonspecific ST changes   Recent Labs: 04/10/2020: B Natriuretic Peptide 736.9 04/11/2020: ALT 23; Magnesium 2.8; TSH 2.059 04/17/2020:  BUN 106; Creatinine, Ser 3.80; Hemoglobin 9.1; Platelets 123; Potassium 3.4; Sodium 140   Lipid Panel    Component Value Date/Time   CHOL 125 03/17/2016 1226   TRIG 92 03/17/2016 1226   HDL 41 03/17/2016 1226   CHOLHDL 3.0 03/17/2016 1226   LDLCALC 66 03/17/2016 1226  Other studies Reviewed: Additional studies/ records that were reviewed today with results demonstrating: hospital records reviewed.   ASSESSMENT AND PLAN:  1. CAD: No angina.  COntinue aggressive secondary prevention.  Healthy diet recommended. 2. CRI: Anemia noted related to CKD.   3. Carotid disease: Moderate plaque bilaterally in 2/22. Repeat study in 1 year. 4. Hyperlipidemia: The current medical regimen is effective;  continue present plan and medications. 5. CRI: Avoid nephrotoxins. Following with nephrology.  Avoid NSAIDs.  Now appears euvolemic. 6. GERD: The current medical regimen is effective;  continue present plan and medications. 7. HTN: Home readings are in the 130-140s typically.    Current medicines are reviewed at length with the patient today.  The patient concerns regarding his medicines were addressed.  The following changes have been made:  No change  Labs/ tests ordered today include:  No orders of the defined types were placed in this encounter.   Recommend 150 minutes/week of aerobic exercise Low fat, low carb, high fiber diet recommended  Disposition:   FU in 6 months   Signed, Peter Grooms, MD  05/21/2020 10:39 AM    [facility] [place], [place], [place]  [address] Phone: [phone]; Fax: [phone]

## 2020-05-21 ENCOUNTER — Ambulatory Visit: Payer: Medicare HMO | Admitting: Interventional Cardiology

## 2020-05-21 ENCOUNTER — Encounter: Payer: Self-pay | Admitting: Interventional Cardiology

## 2020-05-21 ENCOUNTER — Other Ambulatory Visit: Payer: Self-pay

## 2020-05-21 VITALS — BP 144/66 | HR 86 | Ht 69.0 in | Wt 203.8 lb

## 2020-05-21 DIAGNOSIS — N183 Chronic kidney disease, stage 3 unspecified: Secondary | ICD-10-CM | POA: Diagnosis not present

## 2020-05-21 DIAGNOSIS — Z9889 Other specified postprocedural states: Secondary | ICD-10-CM | POA: Diagnosis not present

## 2020-05-21 DIAGNOSIS — I25119 Atherosclerotic heart disease of native coronary artery with unspecified angina pectoris: Secondary | ICD-10-CM | POA: Diagnosis not present

## 2020-05-21 DIAGNOSIS — E782 Mixed hyperlipidemia: Secondary | ICD-10-CM

## 2020-05-21 DIAGNOSIS — I10 Essential (primary) hypertension: Secondary | ICD-10-CM

## 2020-05-21 NOTE — Patient Instructions (Signed)
Medication Instructions:  Your physician recommends that you continue on your current medications as directed. Please refer to the Current Medication list given to you today.  *If you need a refill on your cardiac medications before your next appointment, please call your pharmacy*   Lab Work: none If you have labs (blood work) drawn today and your tests are completely normal, you will receive your results only by: Marland Kitchen MyChart Message (if you have MyChart) OR . A paper copy in the mail If you have any lab test that is abnormal or we need to change your treatment, we will call you to review the results.   Testing/Procedures: none   Follow-Up: At [facility], you and your health needs are our priority.  As part of our continuing mission to provide you with exceptional heart care, we have created designated Provider Care Teams.  These Care Teams include your primary Cardiologist (physician) and Advanced Practice Providers (APPs -  Physician Assistants and Nurse Practitioners) who all work together to provide you with the care you need, when you need it.  We recommend signing up for the patient portal called "MyChart".  Sign up information is provided on this After Visit Summary.  MyChart is used to connect with patients for Virtual Visits (Telemedicine).  Patients are able to view lab/test results, encounter notes, upcoming appointments, etc.  Non-urgent messages can be sent to your provider as well.   To learn more about what you can do with MyChart, go to [email or web address].    Your next appointment:   11/22/20 at 9:20  The format for your next appointment:   In Person  Provider:   Casandra Doffing, MD   Other Instructions

## 2020-08-10 ENCOUNTER — Ambulatory Visit: Payer: Medicare HMO | Admitting: Interventional Cardiology

## 2020-10-11 ENCOUNTER — Encounter: Payer: Self-pay | Admitting: Pulmonary Disease

## 2020-10-11 ENCOUNTER — Ambulatory Visit (INDEPENDENT_AMBULATORY_CARE_PROVIDER_SITE_OTHER): Payer: Worker's Compensation | Admitting: Pulmonary Disease

## 2020-10-11 ENCOUNTER — Other Ambulatory Visit: Payer: Self-pay

## 2020-10-11 VITALS — BP 150/82 | HR 83 | Temp 97.8°F | Ht 69.0 in | Wt 193.6 lb

## 2020-10-11 DIAGNOSIS — J920 Pleural plaque with presence of asbestos: Secondary | ICD-10-CM

## 2020-10-11 NOTE — Patient Instructions (Signed)
Continue exercise regimen and Anoro inhaler I will review the follow-up CT when it is available  Return to clinic in 1 year

## 2020-10-11 NOTE — Progress Notes (Signed)
Peter Oneill    [id number]    01-28-1941  Primary Care Physician:Miller, Lattie Haw, MD  Referring Physician: [name], MD [place],  [place] [id number]  Chief complaint: Follow-up for asbestosis, COPD, emphysema  HPI: Peter Oneill is a 80 year old with past medical history of coronary artery disease status post CABG, hypertension, OSA, asbestosis  He was in the [employer] for 5 years. 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 He reports significant exposure to asbestos for about 28 years in his line of work. He had a chest x-ray that showed interstitial opacities and after a workup he was given a diagnosis of asbestosis in early 1990s and is on Workmen's Compensation. History noted for an episode of hemoptysis in 2001 which was evaluated by CT chest which did not show any evidence of malignancy. He has history of CPAP obstructive sleep apnea and is on CPAP.  He moved from [place] to [place], [place] to be with his daughter and is here to establish care. He was previously followed by Dr. Donneta Romberg, Pulmonologist in [place]. Chief complaint today is chronic dyspnea on exertion, associated with cough, white mucus. He denies hemoptysis, wheezing, fevers, chills. He is not on any inhalers. He had been tried on history. Quit 40 years ago.  Interim history: Stable with regard to breathing.  Has chronic dyspnea on exertion Continues exercise daily with walking.  He uses the exercise bike intermittently due to backache  Outpatient Encounter Medications as of 10/11/2020  Medication Sig   amLODipine (NORVASC) 10 MG tablet Take 1 tablet (10 mg total) by mouth daily.   ANORO ELLIPTA 62.5-25 MCG/INH AEPB USE 1 INHALATION ONCE DAILY (Patient taking differently: Inhale 1 puff into the lungs daily. USE 1 INHALATION ONCE DAILY)   aspirin EC 81 MG tablet Take 81 mg by mouth daily.   carvedilol (COREG) 3.125 MG tablet Take 1  tablet (3.125 mg total) by mouth 2 (two) times daily.   clopidogrel (PLAVIX) 75 MG tablet Take 75 mg by mouth daily.   ezetimibe (ZETIA) 10 MG tablet Take 10 mg by mouth daily.   latanoprost (XALATAN) 0.005 % ophthalmic solution Place 1 drop into both eyes at bedtime.   Multiple Vitamin (MULTIVITAMIN) tablet Take 1 tablet by mouth daily.   nitroGLYCERIN (NITROSTAT) 0.4 MG SL tablet Place 1 tablet (0.4 mg total) under the tongue every 5 (five) minutes as needed for chest pain.   Omega-3 Fatty Acids (FISH OIL PO) Take 2,000 mg by mouth daily.   pantoprazole (PROTONIX) 40 MG tablet TAKE 1 TABLET BY MOUTH  DAILY AS NEEDED (Patient taking differently: Take 40 mg by mouth daily as needed Jerrye Bushy).)   Rosuvastatin Calcium 10 MG CPSP Take 10 mg by mouth daily.   silodosin (RAPAFLO) 4 MG CAPS capsule Take 4 mg by mouth daily with breakfast.   furosemide (LASIX) 80 MG tablet Take 1 tablet (80 mg total) by mouth 2 (two) times daily.   No facility-administered encounter medications on file as of 10/11/2020.   Physical Exam: Blood pressure (!) 150/82, pulse 83, temperature 97.8 F (36.6 C), temperature source Oral, height '5\' 9"'$  (1.753 m), weight 193 lb 9.6 oz (87.8 kg), SpO2 100 %. Gen:      No acute distress HEENT:  EOMI, sclera anicteric Neck:     No masses; no thyromegaly Lungs:    Clear to auscultation bilaterally; normal respiratory effort CV:  Regular rate and rhythm; no murmurs Abd:      + bowel sounds; soft, non-tender; no palpable masses, no distension Ext:    No edema; adequate peripheral perfusion Skin:      Warm and dry; no rash Neuro: alert and oriented x 3 Psych: normal mood and affect   Data Reviewed: Data from [place] PFTs 07/15/11 FVC 2.5 [62%], FEV1 1.91 (62%], FF 76, TLC 73%, diffusion 80%. Mild restrictive lung disease with reduced total lung capacity. Diffusion is low normal. Compared to 2011 there is minimal change in spirometry and a more significant drop in diffusion  CT  chest 03/08/03-extensive bilateral calcified pleural plaques. No evidence of interstitial lung disease CT chest 05/24/16-slight interval progression of patient's known bilateral calcified pleural plaques. There is mild degree of bibasilar interstitial fibrosis. I do not have the images to review.   Date from [place] PFTs 06/23/2017 FVC 2.47 (3%], FEV1 1.78 [64%], F/F 72, TLC 68%, RV/TLC 131%, DLCO 60%, DLCO/VA 94% Moderate obstruction, severe restriction with air trapping.  Moderate diffusion impairment  High-resolution CT chest 06/11/2017- extensive calcified pleural plaques, subpleural reticulation, mild emphysema.  Aortic atherosclerosis I reviewed the images personally  High-resolution CT [facility] 10/19/2018- Bilateral bulky, partially calcified pleural plaques are consistent with the history of prior asbestos exposure. There is mild bibasilar pleural parenchymal banding, though no honeycombing or reticulation to suggest interstitial lung disease. A small loculated right pleural effusion is present.   High-resolution CT [facility] 09/12/2019-no interstitial lung disease, stable stable bulky bilateral partially calcified pleural plaques and a small chronic right pleural effusion.    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 His CT scan shows extensive calcified pleural plaques. He'll need to be monitored annually with lung imaging and pulmonary function tests.  Unfortunately he gets his imaging at [facility] as this is where his workman comp sends him.  We are unable to review images but can see the report He is due to get another CT done later this month  COPD, emphysema Continue Anoro PFTs reviewed with improvement in obstruction and restriction or diffusion impairment This is likely due to his recent exercise regimen.  Congratulated him and encouraged him to continue  OSA Intolerant  of CPAP.  Does not want to retry  Plan/Recommendations: - Continue Anoro - Continue annual monitoring.    Marshell Garfinkel MD [facility] 10/11/2020, 10:50 AM  CC: [name], MD

## 2020-11-12 ENCOUNTER — Telehealth: Payer: Self-pay | Admitting: Pulmonary Disease

## 2020-11-12 DIAGNOSIS — J61 Pneumoconiosis due to asbestos and other mineral fibers: Secondary | ICD-10-CM

## 2020-11-12 NOTE — Telephone Encounter (Signed)
Spoke with the pt's spouse, Lenna Sciara  She states pt is due to have repeat chest CT now  Last one was done at [facility] 09/12/2019   CT Chest High Resolution  Anatomical Region Laterality Modality  Chest -- Computed Tomography   Impression  IMPRESSION:  There is no evidence of interstitial lung disease.   Again noted is sequela of prior asbestos exposure with stable bulky bilateral partially calcified pleural plaques and a small chronic right pleural effusion.   Electronically Signed by: Charlett Lango Narrative  TECHNIQUE: Axial noncontrast prone and supine CT images of the chest were performed.  Additional high-resolution nonhelical axial images were obtained in inspiration and expiration. Coronal and sagittal reconstructed images were obtained and reviewed.   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 There is no honeycombing or subpleural reticulation. Bulky, partially calcified pleural plaques are stable bilaterally. Within the lower lungs, there is similar pleural parenchymal banding and a small, stable chronic right pleural effusion.   There is no adenopathy within the chest. There is been a CABG heavy native coronary artery atherosclerotic calcifications. Heart size is normal. No pericardial effusion.   No osseous lesions. The visualized upper abdomen is unremarkable. Procedure Note  Duayne Cal, MD - 09/12/2019  Formatting of this note might be different from the original.  TECHNIQUE: Axial noncontrast prone and supine CT images of the chest were performed.  Additional high-resolution nonhelical axial images were obtained in inspiration and expiration. Coronal and sagittal reconstructed images were obtained and reviewed.   COMPARISON: October 18, 2018   INDICATION: Pneumoconiosis due to asbestos and other  mineral fibers (#)   FINDINGS:There is no mosaic attenuation on the inspiratory images or abnormal air trapping with expiration. There is no honeycombing or subpleural reticulation. Bulky, partially calcified pleural plaques are stable bilaterally. Within the lower lungs, there is similar pleural parenchymal banding and a small, stable chronic right pleural effusion.   There is no adenopathy within the chest. There is been a CABG heavy native coronary artery atherosclerotic calcifications. Heart size is normal. No pericardial effusion.   No osseous lesions. The visualized upper abdomen is unremarkable.     IMPRESSION:  There is no evidence of interstitial lung disease.   Again noted is sequela of prior asbestos exposure with stable bulky bilateral partially calcified pleural plaques and a small chronic right pleural effusion.   Electronically Signed by: Charlett Lango Exam End: 09/12/19 09:49   Specimen Collected: 09/12/19 12:39 Last Resulted: 09/12/19 12:50  Received From: [place]  Result Received: 09/22/19 13:17   Please advise if he needs to have scan repeated and if so what type of scan. Thank you!

## 2020-11-12 NOTE — Telephone Encounter (Signed)
Pt calling because pt was told that a CT was told we were going to schedule a CT. Pt states his W/C rep hasn't heard from us/ when it's scheduled. Please advise. [id number] W/c repFraser Din  [id number]- claim #

## 2020-11-19 NOTE — Telephone Encounter (Signed)
I called and spoke with patient wife, who is on DPR, regarding HRCT. I have sent in that order so should be reaching out to schedule it. Wife verbalized understanding, nothing further needed.

## 2020-11-19 NOTE — Telephone Encounter (Signed)
Please order follow-up high-resolution CT for asbestos exposure

## 2020-11-21 ENCOUNTER — Inpatient Hospital Stay: Admission: RE | Admit: 2020-11-21 | Payer: Medicare HMO | Source: Ambulatory Visit

## 2020-11-21 NOTE — Progress Notes (Signed)
Cardiology Office Note   Date:  11/22/2020   ID:  Peter Oneill, DOB 1940-05-04, MRN [id number]  PCP:  [name], MD    No chief complaint on file.  CAD  Wt Readings from Last 3 Encounters:  11/22/20 192 lb 3.2 oz (87.2 kg)  10/11/20 193 lb 9.6 oz (87.8 kg)  05/21/20 203 lb 12.8 oz (92.4 kg)       History of Present Illness: Peter Oneill is a 80 y.o. male    Who has had extensive CAD. 3.5 x 13 Cypher, and 3.5 x 18 Cypher in 2003.  He then had CABG in 2003 after having burning in his chest, which occurred with walking/hunting.  A few years later, he required several stents, (2004; 3.5 x 18 Cypher).  Details are not available at this time.  He required repeat CABG in 12/16, with RIMA to LAD and SVG to diagonal.  Last cath was 10/17 in which a stent was placed.  THat cath was done at [place], [place].     Additional anatomic information from prior records from 2015 cath: LIMA to LAD is atretic. LAD filled by SVG to diagonal prior to second bypass surgery in 2016. SVG to circumflex is occluded. This information comes from a cardiac cath report from 2015. At that time in 2015, the RCA was stented with a 3.5 x 12, and 3.0 x 8 Xience drug-eluting stent.  2015 , he had 3.5 x 15 Xience to the SVG to diagonal.   He also has a 3.5 x 22 resolute stent in the circumflex.  3.0 x 12 Onyx in the Circ, 2.5 x 12 in the OM (2017)   He had CEA in 1996    On 12/25/2018, he had persistent chest burning that lasted a few minutes.  He did not use NTG or seek attention in the ER.  He has not had issues with walking or mowing the lawn.  It occurred while he would lie on his right side, and resolved when he changed positions.   He fell in the shower around Thanksgiving 2020.  He had a big bruise.    We had tried Protonix for the chest burning that he had on the right side.  It worked well and there was no residual burning.  He still has some residual back pain from the fall, but it improved.    He  was admitted with ARF in 2/22.  He had severe volume overload, 30 lbs weight gain in a few weeks.  Cr went to 4.3 from baseline of 2.6.   Came down to 3.8 at discharge.     Renal function has been stable of late with Dr. Justin Mend, Cr 2.55 range.   Works in the garden.  Cuts grass.  Has a stationary bike, but use limited by back pain.   Past Medical History:  Diagnosis Date   Asbestosis ([place])    Contraindication to percutaneous coronary intervention (PCI)    Coronary artery disease    Dyslipidemia    Hypercholesterolemia    Hypertension    Ileus ([facility])    Pleural effusion on left    Renal failure    Sleep apnea     Past Surgical History:  Procedure Laterality Date   BACK SURGERY     CARDIAC CATHETERIZATION     CAROTID ENDARTERECTOMY     CARPAL TUNNEL RELEASE     CORONARY ARTERY BYPASS GRAFT     HERNIA REPAIR     PERCUTANEOUS  CORONARY STENT INTERVENTION (PCI-S)     multiple   STERNOTOMY       Current Outpatient Medications  Medication Sig Dispense Refill   amLODipine (NORVASC) 10 MG tablet Take 1 tablet (10 mg total) by mouth daily. 90 tablet 3   ANORO ELLIPTA 62.5-25 MCG/INH AEPB USE 1 INHALATION ONCE DAILY (Patient taking differently: Inhale 1 puff into the lungs daily. USE 1 INHALATION ONCE DAILY) 180 each 3   aspirin EC 81 MG tablet Take 81 mg by mouth daily.     carvedilol (COREG) 3.125 MG tablet Take 1 tablet (3.125 mg total) by mouth 2 (two) times daily. 180 tablet 3   clopidogrel (PLAVIX) 75 MG tablet Take 75 mg by mouth daily.     ezetimibe (ZETIA) 10 MG tablet Take 10 mg by mouth daily.     furosemide (LASIX) 80 MG tablet Take 1 tablet (80 mg total) by mouth 2 (two) times daily. 60 tablet 0   latanoprost (XALATAN) 0.005 % ophthalmic solution Place 1 drop into both eyes at bedtime.     Multiple Vitamin (MULTIVITAMIN) tablet Take 1 tablet by mouth daily.     nitroGLYCERIN (NITROSTAT) 0.4 MG SL tablet Place 1 tablet (0.4 mg total) under the tongue every 5 (five) minutes  as needed for chest pain. 25 tablet 3   Omega-3 Fatty Acids (FISH OIL PO) Take 2,000 mg by mouth daily.     pantoprazole (PROTONIX) 40 MG tablet TAKE 1 TABLET BY MOUTH  DAILY AS NEEDED (Patient taking differently: Take 40 mg by mouth daily as needed Jerrye Bushy).) 90 tablet 3   Rosuvastatin Calcium 10 MG CPSP Take 10 mg by mouth daily.     silodosin (RAPAFLO) 4 MG CAPS capsule Take 4 mg by mouth daily with breakfast.     No current facility-administered medications for this visit.    Allergies:   Atorvastatin    Social History:  The patient  reports that he quit smoking about 43 years ago. His smoking use included cigarettes. He has a 4.75 pack-year smoking history. He has never used smokeless tobacco. He reports current alcohol use. He reports that he does not use drugs.   Family History:  The patient's family history includes Heart disease in his father.    ROS:  Please see the history of present illness.   Otherwise, review of systems are positive for back pain.   All other systems are reviewed and negative.    PHYSICAL EXAM: VS:  BP 130/62   Pulse 64   Ht '5\' 9"'$  (1.753 m)   Wt 192 lb 3.2 oz (87.2 kg)   SpO2 90%   BMI 28.38 kg/m  , BMI Body mass index is 28.38 kg/m. GEN: Well nourished, well developed, in no acute distress HEENT: normal Neck: no JVD, carotid bruits, or masses Cardiac: RRR; no murmurs, rubs, or gallops,no edema  Respiratory:  clear to auscultation bilaterally, normal work of breathing GI: soft, nontender, nondistended, + BS MS: no deformity or atrophy Skin: warm and dry, no rash Neuro:  Strength and sensation are intact Psych: euthymic mood, full affect   EKG:   The ekg ordered 03/2020 demonstrates NSR, PACs   Recent Labs: 04/10/2020: B Natriuretic Peptide 736.9 04/11/2020: ALT 23; Magnesium 2.8; TSH 2.059 04/17/2020: BUN 106; Creatinine, Ser 3.80; Hemoglobin 9.1; Platelets 123; Potassium 3.4; Sodium 140   Lipid Panel    Component Value Date/Time   CHOL  125 03/17/2016 1226   TRIG 92 03/17/2016 1226   HDL 41  03/17/2016 1226   CHOLHDL 3.0 03/17/2016 1226   LDLCALC 66 03/17/2016 1226     Other studies Reviewed: Additional studies/ records that were reviewed today with results demonstrating: labs reviewed.   ASSESSMENT AND PLAN:  CAD: No angina on medical therapy.  Has chronic DOE.  CRI: Avoid nephrotoxins.  Stay well-hydrated.  He does drink a fair amount of iced tea, iced coffee and milk.  Drink plenty of water as well. CArotid disease: s/p CEA. Routine Dopplers Hyperlipidemia: The current medical regimen is effective;  continue present plan and medications. GERD: No further chest pain.  It had resolved with PPI.   HTN: Repeat BP in the office 130/62.Marland KitchenHR well controlled.  Feels that he is sleeping well.     Current medicines are reviewed at length with the patient today.  The patient concerns regarding his medicines were addressed.  The following changes have been made:  No change  Labs/ tests ordered today include:  No orders of the defined types were placed in this encounter.   Recommend 150 minutes/week of aerobic exercise Low fat, low carb, high fiber diet recommended  Disposition:   FU in 1 year   Signed, Larae Grooms, MD  11/22/2020 9:37 AM    [facility] [address], [place], [place]  [address] Phone: [phone]; Fax: [phone]

## 2020-11-22 ENCOUNTER — Ambulatory Visit: Payer: Medicare HMO | Admitting: Interventional Cardiology

## 2020-11-22 ENCOUNTER — Other Ambulatory Visit: Payer: Self-pay

## 2020-11-22 ENCOUNTER — Encounter: Payer: Self-pay | Admitting: Interventional Cardiology

## 2020-11-22 VITALS — BP 130/62 | HR 64 | Ht 69.0 in | Wt 192.2 lb

## 2020-11-22 DIAGNOSIS — N183 Chronic kidney disease, stage 3 unspecified: Secondary | ICD-10-CM

## 2020-11-22 DIAGNOSIS — E782 Mixed hyperlipidemia: Secondary | ICD-10-CM

## 2020-11-22 DIAGNOSIS — I10 Essential (primary) hypertension: Secondary | ICD-10-CM | POA: Diagnosis not present

## 2020-11-22 DIAGNOSIS — I25119 Atherosclerotic heart disease of native coronary artery with unspecified angina pectoris: Secondary | ICD-10-CM

## 2020-11-22 DIAGNOSIS — Z9889 Other specified postprocedural states: Secondary | ICD-10-CM | POA: Diagnosis not present

## 2020-11-22 NOTE — Patient Instructions (Signed)
Medication Instructions:  Your physician recommends that you continue on your current medications as directed. Please refer to the Current Medication list given to you today.  *If you need a refill on your cardiac medications before your next appointment, please call your pharmacy*   Lab Work: none If you have labs (blood work) drawn today and your tests are completely normal, you will receive your results only by: Ewa Gentry (if you have MyChart) OR A paper copy in the mail If you have any lab test that is abnormal or we need to change your treatment, we will call you to review the results.   Testing/Procedures: Your physician has requested that you have a carotid duplex. This test is an ultrasound of the carotid arteries in your neck. It looks at blood flow through these arteries that supply the brain with blood. Allow one hour for this exam. There are no restrictions or special instructions.  To be done in Feb 2023   Follow-Up: At [facility], you and your health needs are our priority.  As part of our continuing mission to provide you with exceptional heart care, we have created designated Provider Care Teams.  These Care Teams include your primary Cardiologist (physician) and Advanced Practice Providers (APPs -  Physician Assistants and Nurse Practitioners) who all work together to provide you with the care you need, when you need it.  We recommend signing up for the patient portal called "MyChart".  Sign up information is provided on this After Visit Summary.  MyChart is used to connect with patients for Virtual Visits (Telemedicine).  Patients are able to view lab/test results, encounter notes, upcoming appointments, etc.  Non-urgent messages can be sent to your provider as well.   To learn more about what you can do with MyChart, go to [email or web address].    Your next appointment:   12 month(s)  The format for your next appointment:   In Person  Provider:   You  may see Larae Grooms, MD or one of the following Advanced Practice Providers on your designated Care Team:   Melina Copa, PA-C Ermalinda Barrios, PA-C   Other Instructions

## 2020-12-05 ENCOUNTER — Telehealth: Payer: Self-pay | Admitting: Pulmonary Disease

## 2020-12-05 NOTE — Telephone Encounter (Signed)
Call returned to [facility], they are trying to get patient CT scheduled but need a signed order faxed to [phone].   Order printed and faxed.   Nothing further needed at this time.

## 2020-12-10 ENCOUNTER — Other Ambulatory Visit: Payer: Self-pay | Admitting: Interventional Cardiology

## 2020-12-12 ENCOUNTER — Other Ambulatory Visit: Payer: Self-pay | Admitting: Interventional Cardiology

## 2020-12-15 ENCOUNTER — Other Ambulatory Visit: Payer: Self-pay | Admitting: Interventional Cardiology

## 2020-12-24 ENCOUNTER — Other Ambulatory Visit: Payer: Self-pay | Admitting: Pulmonary Disease

## 2020-12-28 ENCOUNTER — Telehealth: Payer: Self-pay | Admitting: Pulmonary Disease

## 2020-12-28 NOTE — Telephone Encounter (Signed)
Will route to RB to make him aware that pt dropped off the disc.

## 2020-12-28 NOTE — Telephone Encounter (Signed)
Forwarding to Dr Vaughan Browner

## 2021-01-07 ENCOUNTER — Telehealth: Payer: Self-pay | Admitting: Pulmonary Disease

## 2021-01-07 NOTE — Telephone Encounter (Signed)
Will forward to PM to see if he has been able to review this disc.  thanks

## 2021-01-08 NOTE — Telephone Encounter (Signed)
Yes. Lung findings are stable with no change compared to prior. Continue monitoring

## 2021-01-08 NOTE — Telephone Encounter (Signed)
Call made to patient, confirmed DOB.  Made aware of results per PM. Voiced understanding.   Nothing further needed at this time.

## 2021-03-29 ENCOUNTER — Ambulatory Visit (HOSPITAL_COMMUNITY)
Admission: RE | Admit: 2021-03-29 | Discharge: 2021-03-29 | Disposition: A | Discharge status: Home / Self Care | Payer: Medicare HMO | Source: Ambulatory Visit | Attending: Cardiology | Admitting: Cardiology

## 2021-03-29 ENCOUNTER — Other Ambulatory Visit: Payer: Self-pay

## 2021-03-29 DIAGNOSIS — I6523 Occlusion and stenosis of bilateral carotid arteries: Secondary | ICD-10-CM

## 2021-04-08 ENCOUNTER — Other Ambulatory Visit: Payer: Self-pay | Admitting: *Deleted

## 2021-04-08 DIAGNOSIS — I6523 Occlusion and stenosis of bilateral carotid arteries: Secondary | ICD-10-CM

## 2021-04-08 DIAGNOSIS — Z9889 Other specified postprocedural states: Secondary | ICD-10-CM

## 2021-04-15 ENCOUNTER — Other Ambulatory Visit: Payer: Self-pay

## 2021-04-15 DIAGNOSIS — N183 Chronic kidney disease, stage 3 unspecified: Secondary | ICD-10-CM

## 2021-04-18 NOTE — Progress Notes (Signed)
Office Note     CC:  ESRD Requesting Provider:  [name], MD  HPI: Peter Oneill is a Right handed 81 y.o. (17-Apr-1940) male with kidney disease who presents at the request of Peter Lass, MD for permanent HD access. The patient has had no prior access procedures.  Not currently on dialysis, chronic kidney disease-stage IV.  Medical history includes, CABG 2003, coronary stenting 2004, repeat CABG 2016, repeat stenting 2017.  Also with COPD, CKD 4, bilateral carpal tunnel release.  On exam, Peter Oneill was doing well, accompanied by his wife.  His daughter was on speaker phone.  He had no complaints other than significant weight gain. 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  His wife is a Marine scientist by trade, daughter works in Corporate treasurer.  Infant was asked to present today for fistula discussion.  He had no prior knowledge of dialysis.    The pt is  on a statin for cholesterol management.  The pt is  on a daily aspirin.   Other AC:  plavix The pt is  on medications for hypertension.   The pt is not diabetic. Tobacco hx:  -  Past Medical History:  Diagnosis Date   Asbestosis ([place])    Contraindication to percutaneous coronary intervention (PCI)    Coronary artery disease    Dyslipidemia    Hypercholesterolemia    Hypertension    Ileus ([facility])    Pleural effusion on left    Renal failure    Sleep apnea     Past Surgical History:  Procedure Laterality Date   BACK SURGERY     CARDIAC CATHETERIZATION     CAROTID ENDARTERECTOMY     CARPAL TUNNEL RELEASE     CORONARY ARTERY BYPASS GRAFT     HERNIA REPAIR     PERCUTANEOUS CORONARY STENT INTERVENTION (PCI-S)     multiple   STERNOTOMY      Social History   Socioeconomic History   Marital status: Married    Spouse name: Not on file   Number of children: Not on file   Years of education: Not on file   Highest education level: Not on file  Occupational History   Not on file  Tobacco Use    Smoking status: Former    Packs/day: 0.25    Years: 19.00    Pack years: 4.75    Types: Cigarettes    Quit date: 12/10/1976    Years since quitting: 44.3   Smokeless tobacco: Never  Vaping Use   Vaping Use: Never used  Substance and Sexual Activity   Alcohol use: Yes    Comment: occasional   Drug use: No   Sexual activity: Yes    Comment: married  Other Topics Concern   Not on file  Social History Narrative   Not on file   Social Determinants of Health   Financial Resource Strain: Not on file  Food Insecurity: Not on file  Transportation Needs: Not on file  Physical Activity: Not on file  Stress: Not on file  Social Connections: Not on file  Intimate Partner Violence: Not on file   Family History  Problem Relation Age of Onset   Heart disease Father     Current Outpatient Medications  Medication Sig Dispense Refill   amLODipine (NORVASC) 10 MG tablet TAKE 1 TABLET BY MOUTH  DAILY 90 tablet 3   ANORO ELLIPTA 62.5-25 MCG/ACT AEPB Inhale 1 puff into the lungs daily. USE 1  INHALATION ONCE DAILY 60 each 5   aspirin EC 81 MG tablet Take 81 mg by mouth daily.     carvedilol (COREG) 3.125 MG tablet TAKE 1 TABLET BY MOUTH  TWICE DAILY 180 tablet 3   clopidogrel (PLAVIX) 75 MG tablet Take 75 mg by mouth daily.     ezetimibe (ZETIA) 10 MG tablet Take 10 mg by mouth daily.     furosemide (LASIX) 80 MG tablet Take 1 tablet (80 mg total) by mouth 2 (two) times daily. 60 tablet 0   latanoprost (XALATAN) 0.005 % ophthalmic solution Place 1 drop into both eyes at bedtime.     Multiple Vitamin (MULTIVITAMIN) tablet Take 1 tablet by mouth daily.     nitroGLYCERIN (NITROSTAT) 0.4 MG SL tablet Place 1 tablet (0.4 mg total) under the tongue every 5 (five) minutes as needed for chest pain. 25 tablet 3   Omega-3 Fatty Acids (FISH OIL PO) Take 2,000 mg by mouth daily.     pantoprazole (PROTONIX) 40 MG tablet TAKE 1 TABLET BY MOUTH  DAILY AS NEEDED (Patient taking differently: Take 40 mg by  mouth daily as needed Peter Oneill).) 90 tablet 3   Rosuvastatin Calcium 10 MG CPSP Take 10 mg by mouth daily.     silodosin (RAPAFLO) 4 MG CAPS capsule Take 4 mg by mouth daily with breakfast.     No current facility-administered medications for this visit.    Allergies  Allergen Reactions   Atorvastatin Other (See Comments)    Muscle aches     REVIEW OF SYSTEMS:  [X]  denotes positive finding, [ ]  denotes negative finding Cardiac  Comments:  Chest pain or chest pressure:    Shortness of breath upon exertion:    Short of breath when lying flat:    Irregular heart rhythm:        Vascular    Pain in calf, thigh, or hip brought on by ambulation:    Pain in feet at night that wakes you up from your sleep:     Blood clot in your veins:    Leg swelling:         Pulmonary    Oxygen at home:    Productive cough:     Wheezing:         Neurologic    Sudden weakness in arms or legs:     Sudden numbness in arms or legs:     Sudden onset of difficulty speaking or slurred speech:    Temporary loss of vision in one eye:     Problems with dizziness:         Gastrointestinal    Blood in stool:     Vomited blood:         Genitourinary    Burning when urinating:     Blood in urine:        Psychiatric    Major depression:         Hematologic    Bleeding problems:    Problems with blood clotting too easily:        Skin    Rashes or ulcers:        Constitutional    Fever or chills:      PHYSICAL EXAMINATION:  There were no vitals filed for this visit.  General:  WDWN in NAD; vital signs documented above Gait: Not observed HENT: WNL, normocephalic Pulmonary: normal non-labored breathing , without Rales, rhonchi,  wheezing Cardiac: regular HR Abdomen: soft, NT, no masses Skin: without  rashes Vascular Exam/Pulses:  Right Left  Radial 2+ (normal) 2+ (normal)  Ulnar 2+ (normal) 2+ (normal)                   Extremities: without ischemic changes, without Gangrene ,  without cellulitis; without open wounds;  Musculoskeletal: no muscle wasting or atrophy  Neurologic: A&O X 3;  No focal weakness or paresthesias are detected Psychiatric:  The pt has Normal affect.   Non-Invasive Vascular Imaging:   Sufficient diameter of bilateral cephalic and basilic veins for fistula creation The patient diameter of brachial artery at the antecubital fossa for fistula creation    ASSESSMENT/PLAN:  Gal Feldhaus is a 81 y.o. male who presents with chronic kidney disease stage 4  Based on vein mapping and examination, patient has usable vein in bilateral upper extremities.  Being that he is right-handed, he would benefit most from left-sided brachiocephalic fistula creation I had an extensive discussion with this patient in regards to the nature of access surgery, including risk, benefits, and alternatives.   The patient is aware that the risks of access surgery include but are not limited to: bleeding, infection, steal syndrome, nerve damage, ischemic monomelic neuropathy, failure of access to mature, complications related to venous hypertension, and possible need for additional access procedures in the future. I discussed with the patient the nature of the staged access procedure, specifically the need for a second operation to transpose the first stage fistula if it matures adequately.   The patient has agreed to proceed with the above procedure which will be scheduled.  Broadus John, MD [facility] [phone]

## 2021-04-19 ENCOUNTER — Encounter: Payer: Self-pay | Admitting: Interventional Cardiology

## 2021-04-19 ENCOUNTER — Ambulatory Visit (HOSPITAL_COMMUNITY)
Admission: RE | Admit: 2021-04-19 | Discharge: 2021-04-19 | Disposition: A | Discharge status: Home / Self Care | Payer: Medicare HMO | Source: Ambulatory Visit | Attending: Vascular Surgery | Admitting: Vascular Surgery

## 2021-04-19 ENCOUNTER — Other Ambulatory Visit: Payer: Self-pay

## 2021-04-19 ENCOUNTER — Encounter: Payer: Self-pay | Admitting: Vascular Surgery

## 2021-04-19 ENCOUNTER — Ambulatory Visit (INDEPENDENT_AMBULATORY_CARE_PROVIDER_SITE_OTHER)
Admission: RE | Admit: 2021-04-19 | Discharge: 2021-04-19 | Disposition: A | Discharge status: Home / Self Care | Payer: Medicare HMO | Source: Ambulatory Visit | Attending: Vascular Surgery | Admitting: Vascular Surgery

## 2021-04-19 ENCOUNTER — Ambulatory Visit: Payer: Medicare HMO | Admitting: Vascular Surgery

## 2021-04-19 VITALS — BP 151/73 | HR 63 | Temp 98.1°F | Resp 20 | Ht 69.0 in | Wt 225.0 lb

## 2021-04-19 DIAGNOSIS — N183 Chronic kidney disease, stage 3 unspecified: Secondary | ICD-10-CM | POA: Insufficient documentation

## 2021-04-19 DIAGNOSIS — N184 Chronic kidney disease, stage 4 (severe): Secondary | ICD-10-CM

## 2021-04-22 ENCOUNTER — Telehealth: Payer: Self-pay | Admitting: *Deleted

## 2021-04-22 ENCOUNTER — Other Ambulatory Visit: Payer: Self-pay

## 2021-04-22 ENCOUNTER — Encounter (HOSPITAL_COMMUNITY): Payer: Self-pay | Admitting: Vascular Surgery

## 2021-04-22 NOTE — Progress Notes (Signed)
Anesthesia Chart Review: Same day workup  Patient follows with cardiology for extensive history of CAD s/p multiple interventions.  CABG 2003 ultimately required repeat CABG 2016.  Last cath was October 2017 which a stent was placed; that was done in [place].  Anatomic information per cardiology notes, "Additional anatomic information from prior records from 2015 cath: LIMA to LAD is atretic. LAD filled by SVG to diagonal prior to second bypass surgery in 2016. SVG to circumflex is occluded. This information comes from a cardiac cath report from 2015. At that time in 2015, the RCA was stented with a 3.5 x 12, and 3.0 x 8 Xience drug-eluting stent.  2015 , he had 3.5 x 15 Xience to the SVG to diagonal. He also has a 3.5 x 22 resolute stent in the circumflex.  3.0 x 12 Onyx in the Circ, 2.5 x 12 in the OM (2017)."  Last seen by Dr. Irish Lack 11/22/2020 and at that time was noted to have no angina, does have chronic DOE.  Cardiac clearance per telephone encounter 04/22/2021, "Chart reviewed as part of pre-operative protocol coverage. Given past medical history and time since last visit, based on ACC/AHA guidelines, Peter Oneill would be at acceptable risk for the planned procedure without further cardiovascular testing. His RCRI is a class III risk, 6.6% risk of major cardiac event.  He is able to complete greater than 4 METS of physical activity. Peter Oneill was contacted 04/22/21 when his preoperative cardiac request was received.  He reports that he began holding his Plavix on 04/19/2021.  Please resume Plavix after procedure as soon as hemostasis is achieved."  Patient reports last dose Plavix 04/19/2021.  History of CEA 1996.  Moderate carotid disease bilaterally by duplex 03/29/2021.  History of OSA, patient reports he does not use CPAP.  CKD 4, not yet on HD.  Followed by Dr. Justin Mend.  Patient will need day of surgery labs and evaluation.  Per Dr. Hassell Done note 11/22/20, EKGs from 03/2020  show NSR with PACs.  Patient was admitted at that time with acute renal failure and severe volume overload.  Carotid duplex 03/29/21: Summary:  Right Carotid: Velocities in the right ICA are consistent with a 40-59% stenosis. The ECA appears >50% stenosed.  Left Carotid: Velocities in the left ICA are consistent with a 40-59% stenosis. The ECA appears >50% stenosed.  Vertebrals:  Bilateral vertebral arteries demonstrate antegrade flow.  Subclavians: Normal flow hemodynamics were seen in bilateral subclavian arteries. Poorly visualized.   TTE 04/11/20:  1. Left ventricular ejection fraction, by estimation, is 50 to 55%. The  left ventricle has low normal function. The left ventricle demonstrates  regional wall motion abnormalities. Anteroseptal hypokinesis. There is  mild left ventricular hypertrophy.  Left ventricular diastolic parameters are indeterminate.   2. Right ventricular systolic function is mildly reduced. The right  ventricular size is mildly enlarged. Tricuspid regurgitation signal is  inadequate for assessing PA pressure.   3. The mitral valve is grossly normal. No evidence of mitral valve  regurgitation.   4. The aortic valve was not well visualized. Aortic valve regurgitation  is not visualized. No aortic stenosis is present.     Peter Oneill

## 2021-04-22 NOTE — Anesthesia Preprocedure Evaluation (Addendum)
Anesthesia Evaluation  Patient identified by MRN, date of birth, ID band Patient awake    Reviewed: Allergy & Precautions, NPO status , Patient's Chart, lab work & pertinent test results  Airway Mallampati: II  TM Distance: >3 FB Neck ROM: Full   Comment: Redundant neck tissue Dental  (+) Poor Dentition, Missing, Chipped Multiple missing/broken teeth, none loose per patient:   Pulmonary shortness of breath, sleep apnea , former smoker,    Pulmonary exam normal breath sounds clear to auscultation       Cardiovascular hypertension, + CAD (on plavix last dose 04/19/21)  Normal cardiovascular exam Rhythm:Regular Rate:Normal  EKG: 04/23/21 Unusual P axis, possible ectopic atrial rhythm with 2nd degree A-V block (Mobitz I) Nonspecific ST and T wave abnormality Abnormal ECG When compared with ECG of 11-Apr-2020 06:03, PREVIOUS ECG IS PRESENT  Mobitz I also present on EKG from 01/2020.   ECHO 04/11/20:  1. Left ventricular ejection fraction, by estimation, is 50 to 55%. The  left ventricle has low normal function. The left ventricle demonstrates  regional wall motion abnormalities. Anteroseptal hypokinesis. There is  mild left ventricular hypertrophy.  Left ventricular diastolic parameters are indeterminate.  2. Right ventricular systolic function is mildly reduced. The right  ventricular size is mildly enlarged. Tricuspid regurgitation signal is  inadequate for assessing PA pressure.  3. The mitral valve is grossly normal. No evidence of mitral valve  regurgitation.  4. The aortic valve was not well visualized. Aortic valve regurgitation  is not visualized. No aortic stenosis is present.    Neuro/Psych negative neurological ROS  negative psych ROS   GI/Hepatic negative GI ROS, Neg liver ROS,   Endo/Other  negative endocrine ROS  Renal/GU ARF and Renal InsufficiencyRenal disease  negative genitourinary    Musculoskeletal negative musculoskeletal ROS (+)   Abdominal   Peds negative pediatric ROS (+)  Hematology negative hematology ROS (+)   Anesthesia Other Findings Left eye conjunctiva with redness especially on the medial side prior to nerve block  Reproductive/Obstetrics negative OB ROS                        Anesthesia Physical Anesthesia Plan  ASA: 3  Anesthesia Plan: MAC and Regional   Post-op Pain Management:    Induction: Intravenous  PONV Risk Score and Plan: 1 and Ondansetron, Dexamethasone, Propofol infusion, TIVA and Treatment may vary due to age or medical condition  Airway Management Planned: Natural Airway and Simple Face Mask  Additional Equipment: None  Intra-op Plan:   Post-operative Plan: Extubation in OR  Informed Consent: I have reviewed the patients History and Physical, chart, labs and discussed the procedure including the risks, benefits and alternatives for the proposed anesthesia with the patient or authorized representative who has indicated his/her understanding and acceptance.     Dental advisory given  Plan Discussed with: CRNA, Anesthesiologist and Surgeon  Anesthesia Plan Comments: (PAT note by Karoline Caldwell, PA-C: Patient follows with cardiology for extensive history of CAD s/p multiple interventions.  CABG 2003 ultimately required repeat CABG 2016.  Last cath was October 2017 which a stent was placed; that was [place] in [place].  Anatomic information per cardiology notes, "Additional anatomic information from prior records from 2015 cath: LIMA to LAD is atretic. LAD filled by SVG to diagonal prior to second bypass surgery in 2016. SVG to circumflex is occluded. This information comes from a cardiac cath report from 2015. At that timein 2015, the RCA was  stented with a 3.5 x 12, and 3.0 x 8Xience drug-eluting stent. 2015 , he had 3.5 x 15 Xience to the SVG to diagonal. He also has a 3.5 x 22 resolute stent  in the circumflex.3.0 x 12 Onyx in the Circ, 2.5 x 12 in the OM (2017)."  Last seen by Dr. Irish Lack 11/22/2020 and at that time was noted to have no angina, does have chronic DOE.  Cardiac clearance per telephone encounter 04/22/2021, "Chart reviewed as part of pre-operative protocol coverage. Given past medical history and time since last visit, based on ACC/AHA guidelines,Dewan Michalskiwould be at acceptable risk for the planned procedure without further cardiovascular testing. His RCRI is a class III risk, 6.6% risk of major cardiac event. He is able to complete greater than 4 METS of physical activity. Mr. Lebeau contacted2/27/23when his preoperative cardiac request was received. He reports that he began holding his Plavix on 04/19/2021. Please resume Plavix after procedure as soon as hemostasis is achieved."  Patient reports last dose Plavix 04/19/2021.  History of CEA 1996.  Moderate carotid disease bilaterally by duplex 03/29/2021.  History of OSA, patient reports he does not use CPAP.  CKD 4, not yet on HD.  Followed by Dr. Justin Mend.  Patient will need day of surgery labs and evaluation.  Per Dr. Hassell Done note 11/22/20, EKGs from 03/2020 show NSR with PACs.  Patient was admitted at that time with acute renal failure and severe volume overload.  Carotid duplex 03/29/21: Summary:  Right Carotid: Velocities in the right ICA are consistent with a 40-59% stenosis. The ECA appears >50% stenosed.  Left Carotid: Velocities in the left ICA are consistent with a 40-59% stenosis. The ECA appears >50% stenosed.  Vertebrals: Bilateral vertebral arteries demonstrate antegrade flow.  Subclavians: Normal flow hemodynamics were seen in bilateral subclavian arteries. Poorly visualized.   TTE 04/11/20: 1. Left ventricular ejection fraction, by estimation, is 50 to 55%. The  left ventricle has low normal function. The left ventricle demonstrates  regional wall motion abnormalities. Anteroseptal  hypokinesis. There is  mild left ventricular hypertrophy.  Left ventricular diastolic parameters are indeterminate.  2. Right ventricular systolic function is mildly reduced. The right  ventricular size is mildly enlarged. Tricuspid regurgitation signal is  inadequate for assessing PA pressure.  3. The mitral valve is grossly normal. No evidence of mitral valve  regurgitation.  4. The aortic valve was not well visualized. Aortic valve regurgitation  is not visualized. No aortic stenosis is present.  )       Anesthesia Quick Evaluation

## 2021-04-22 NOTE — Telephone Encounter (Signed)
° °  Pre-operative Risk Assessment    Patient Name: Peter Oneill  DOB: October 26, 1940 MRN: [id number]      Request for Surgical Clearance    Procedure:   LEFT ARM FISTULA CREATION  Date of Surgery:  Clearance 04/23/21                                 Surgeon:  DR. Gwenlyn Saran Surgeon's Group or Practice Name:  [facility] Phone number:  [phone] Fax number:  [phone]   Type of Clearance Requested:   - Medical  - Pharmacy:  Hold Clopidogrel (Plavix) x 3 DAYS PRIOR   Type of Anesthesia:  MAC   Additional requests/questions:    Jiles Prows   04/22/2021, 8:53 AM

## 2021-04-22 NOTE — Telephone Encounter (Signed)
° °  Primary Cardiologist: Larae Grooms, MD  Chart reviewed as part of pre-operative protocol coverage. Given past medical history and time since last visit, based on ACC/AHA guidelines, Peter Oneill would be at acceptable risk for the planned procedure without further cardiovascular testing.   His RCRI is a class III risk, 6.6% risk of major cardiac event.  He is able to complete greater than 4 METS of physical activity.  Peter Oneill was contacted 04/22/21 when his preoperative cardiac request was received.  He reports that he began holding his Plavix on 04/19/2021.  Please resume Plavix after procedure as soon as hemostasis is achieved.  Patient was advised that if he develops new symptoms prior to surgery to contact our office to arrange a follow-up appointment.  He verbalized understanding.  I will route this recommendation to the requesting party via Epic fax function and remove from pre-op pool.  Please call with questions.  Jossie Ng. Correen Bubolz NP-C    04/22/2021, 9:27 AM [place] [address] Office [phone] Fax [phone]

## 2021-04-22 NOTE — Progress Notes (Signed)
Left message for pt and pts daughter of arrival time change to 0800

## 2021-04-22 NOTE — Pre-Procedure Instructions (Signed)
PCP - [name], MD Cardiologist - Larae Grooms, MD Pulmonologist- Marshell Garfinkel, MD  Chest x-ray -  EKG - DOS Stress Test - 2010 ECHO - 04/11/20 Cardiac Cath - 2017   CPAP - Does not use   Blood Thinner Instructions: Plavix last dose on 04/19/21 Aspirin Instructions: Take DOS  COVID TEST- N/A Ambulatory  Anesthesia review: Y  Patient verbally denies any shortness of breath, fever, cough and chest pain during phone call   -------------  SDW INSTRUCTIONS given:  Your procedure is scheduled on 04/23/21.  Report to [facility] Main Entrance "A" at 0700 A.M., and check in at the Admitting office.  Call this number if you have problems the morning of surgery:  [phone]   Remember:  Do not eat or drink after midnight the night before your surgery    Take these medicines the morning of surgery with A SIP OF WATER  allopurinol (ZYLOPRIM) amLODipine (NORVASC) ANORO ELLIPTA  carvedilol (COREG) Aspirin ezetimibe (ZETIA) rosuvastatin (CRESTOR) nitroGLYCERIN-if needed   As of today, STOP taking any Aspirin (unless otherwise instructed by your surgeon), Aleve, Naproxen, Ibuprofen, Motrin, Advil, Goody's, BC's, all herbal medications, fish oil, and all vitamins.                      Do not wear jewelry, make up, or nail polish            Do not wear lotions, powders, perfumes/colognes, or deodorant.            Do not shave 48 hours prior to surgery.  Men may shave face and neck.            Do not bring valuables to the hospital.            [facility] is not responsible for any belongings or valuables.  Do NOT Smoke (Tobacco/Vaping) 24 hours prior to your procedure If you use a CPAP at night, you may bring all equipment for your overnight stay.   Contacts, glasses, dentures or bridgework may not be worn into surgery.      For patients admitted to the hospital, discharge time will be determined by your treatment team.   Patients discharged the day of surgery will  not be allowed to drive home, and someone needs to stay with them for 24 hours.    Special instructions:   - Preparing For Surgery  Before surgery, you can play an important role. Because skin is not sterile, your skin needs to be as free of germs as possible. You can reduce the number of germs on your skin by washing with CHG (chlorahexidine gluconate) Soap before surgery.  CHG is an antiseptic cleaner which kills germs and bonds with the skin to continue killing germs even after washing.    Oral Hygiene is also important to reduce your risk of infection.  Remember - BRUSH YOUR TEETH THE MORNING OF SURGERY WITH YOUR REGULAR TOOTHPASTE  Please do not use if you have an allergy to CHG or antibacterial soaps. If your skin becomes reddened/irritated stop using the CHG.  Do not shave (including legs and underarms) for at least 48 hours prior to first CHG shower. It is OK to shave your face.  Please follow these instructions carefully.   Shower the NIGHT BEFORE SURGERY and the MORNING OF SURGERY with DIAL Soap.   Pat yourself dry with a CLEAN TOWEL.  Wear CLEAN PAJAMAS to bed the night before surgery  Place CLEAN  SHEETS on your bed the night of your first shower and DO NOT SLEEP WITH PETS.   Day of Surgery: Please shower morning of surgery  Wear Clean/Comfortable clothing the morning of surgery Do not apply any deodorants/lotions.   Remember to brush your teeth WITH YOUR REGULAR TOOTHPASTE.   Questions were answered. Patient verbalized understanding of instructions.

## 2021-04-23 ENCOUNTER — Other Ambulatory Visit: Payer: Self-pay

## 2021-04-23 ENCOUNTER — Encounter (HOSPITAL_COMMUNITY): Payer: Self-pay | Admitting: Vascular Surgery

## 2021-04-23 ENCOUNTER — Encounter (HOSPITAL_COMMUNITY)
Admission: RE | Disposition: A | Discharge status: Home / Self Care | Payer: Self-pay | Source: Home / Self Care | Attending: Vascular Surgery

## 2021-04-23 ENCOUNTER — Ambulatory Visit (HOSPITAL_COMMUNITY): Payer: Medicare HMO | Admitting: Physician Assistant

## 2021-04-23 ENCOUNTER — Ambulatory Visit (HOSPITAL_BASED_OUTPATIENT_CLINIC_OR_DEPARTMENT_OTHER): Payer: Medicare HMO | Admitting: Physician Assistant

## 2021-04-23 ENCOUNTER — Ambulatory Visit (HOSPITAL_COMMUNITY)
Admission: RE | Admit: 2021-04-23 | Discharge: 2021-04-23 | Disposition: A | Discharge status: Home / Self Care | Payer: Medicare HMO | Attending: Vascular Surgery | Admitting: Vascular Surgery

## 2021-04-23 DIAGNOSIS — N186 End stage renal disease: Secondary | ICD-10-CM | POA: Insufficient documentation

## 2021-04-23 DIAGNOSIS — Z79899 Other long term (current) drug therapy: Secondary | ICD-10-CM | POA: Insufficient documentation

## 2021-04-23 DIAGNOSIS — N184 Chronic kidney disease, stage 4 (severe): Secondary | ICD-10-CM | POA: Diagnosis not present

## 2021-04-23 DIAGNOSIS — N179 Acute kidney failure, unspecified: Secondary | ICD-10-CM | POA: Diagnosis not present

## 2021-04-23 DIAGNOSIS — Z992 Dependence on renal dialysis: Secondary | ICD-10-CM | POA: Diagnosis not present

## 2021-04-23 DIAGNOSIS — Z955 Presence of coronary angioplasty implant and graft: Secondary | ICD-10-CM | POA: Insufficient documentation

## 2021-04-23 DIAGNOSIS — I2510 Atherosclerotic heart disease of native coronary artery without angina pectoris: Secondary | ICD-10-CM

## 2021-04-23 DIAGNOSIS — I129 Hypertensive chronic kidney disease with stage 1 through stage 4 chronic kidney disease, or unspecified chronic kidney disease: Secondary | ICD-10-CM

## 2021-04-23 DIAGNOSIS — I120 Hypertensive chronic kidney disease with stage 5 chronic kidney disease or end stage renal disease: Secondary | ICD-10-CM | POA: Insufficient documentation

## 2021-04-23 DIAGNOSIS — Z7902 Long term (current) use of antithrombotics/antiplatelets: Secondary | ICD-10-CM | POA: Insufficient documentation

## 2021-04-23 DIAGNOSIS — Z951 Presence of aortocoronary bypass graft: Secondary | ICD-10-CM | POA: Insufficient documentation

## 2021-04-23 DIAGNOSIS — J449 Chronic obstructive pulmonary disease, unspecified: Secondary | ICD-10-CM | POA: Insufficient documentation

## 2021-04-23 DIAGNOSIS — Z87891 Personal history of nicotine dependence: Secondary | ICD-10-CM | POA: Insufficient documentation

## 2021-04-23 DIAGNOSIS — Z7982 Long term (current) use of aspirin: Secondary | ICD-10-CM | POA: Diagnosis not present

## 2021-04-23 HISTORY — PX: AV FISTULA PLACEMENT: SHX1204

## 2021-04-23 HISTORY — DX: Pneumonia, unspecified organism: J18.9

## 2021-04-23 HISTORY — DX: Personal history of diseases of the blood and blood-forming organs and certain disorders involving the immune mechanism: Z86.2

## 2021-04-23 HISTORY — DX: Dyspnea, unspecified: R06.00

## 2021-04-23 LAB — POCT I-STAT, CHEM 8
BUN: 92 mg/dL — ABNORMAL HIGH (ref 8–23)
Calcium, Ion: 1.08 mmol/L — ABNORMAL LOW (ref 1.15–1.40)
Chloride: 108 mmol/L (ref 98–111)
Creatinine, Ser: 4.2 mg/dL — ABNORMAL HIGH (ref 0.61–1.24)
Glucose, Bld: 93 mg/dL (ref 70–99)
HCT: 34 % — ABNORMAL LOW (ref 39.0–52.0)
Hemoglobin: 11.6 g/dL — ABNORMAL LOW (ref 13.0–17.0)
Potassium: 3.9 mmol/L (ref 3.5–5.1)
Sodium: 143 mmol/L (ref 135–145)
TCO2: 24 mmol/L (ref 22–32)

## 2021-04-23 SURGERY — ARTERIOVENOUS (AV) FISTULA CREATION
Anesthesia: Monitor Anesthesia Care | Laterality: Left

## 2021-04-23 MED ORDER — HEPARIN 6000 UNIT IRRIGATION SOLUTION
Status: DC | PRN
  Administered 2021-04-23: 1

## 2021-04-23 MED ORDER — MIDAZOLAM HCL 2 MG/2ML IJ SOLN
INTRAMUSCULAR | Status: AC
  Filled 2021-04-23: qty 2

## 2021-04-23 MED ORDER — ESMOLOL HCL 100 MG/10ML IV SOLN
INTRAVENOUS | Status: AC
  Filled 2021-04-23: qty 10

## 2021-04-23 MED ORDER — CHLORHEXIDINE GLUCONATE 0.12 % MT SOLN: 15.0000 mL | Freq: Once | OROMUCOSAL | Status: AC

## 2021-04-23 MED ORDER — ACETAMINOPHEN 500 MG PO TABS
1000.0000 mg | ORAL_TABLET | Freq: Once | ORAL | Status: AC
  Administered 2021-04-23: 1000 mg via ORAL
  Filled 2021-04-23: qty 2

## 2021-04-23 MED ORDER — PROPOFOL 10 MG/ML IV BOLUS
INTRAVENOUS | Status: DC | PRN
  Administered 2021-04-23: 40 mg via INTRAVENOUS

## 2021-04-23 MED ORDER — OXYCODONE HCL 5 MG/5ML PO SOLN: 5.0000 mg | Freq: Once | ORAL | Status: DC | PRN

## 2021-04-23 MED ORDER — FENTANYL CITRATE PF 50 MCG/ML IJ SOSY: 50.0000 ug | PREFILLED_SYRINGE | Freq: Once | INTRAMUSCULAR | Status: DC

## 2021-04-23 MED ORDER — HEPARIN SODIUM (PORCINE) 1000 UNIT/ML IJ SOLN
INTRAMUSCULAR | Status: DC | PRN
  Administered 2021-04-23: 2000 [IU] via INTRAVENOUS

## 2021-04-23 MED ORDER — PROPOFOL 500 MG/50ML IV EMUL
INTRAVENOUS | Status: DC | PRN
  Administered 2021-04-23: 50 ug/kg/min via INTRAVENOUS

## 2021-04-23 MED ORDER — HYDROCODONE-ACETAMINOPHEN 5-325 MG PO TABS: 1.0000 | ORAL_TABLET | Freq: Four times a day (QID) | ORAL | 0 refills | Status: DC | PRN

## 2021-04-23 MED ORDER — ROPIVACAINE HCL 5 MG/ML IJ SOLN
INTRAMUSCULAR | Status: DC | PRN
  Administered 2021-04-23: 30 mL via PERINEURAL

## 2021-04-23 MED ORDER — SODIUM CHLORIDE 0.9 % IV SOLN: INTRAVENOUS | Status: DC

## 2021-04-23 MED ORDER — CHLORHEXIDINE GLUCONATE 0.12 % MT SOLN
OROMUCOSAL | Status: AC
  Administered 2021-04-23: 15 mL via OROMUCOSAL
  Filled 2021-04-23: qty 15

## 2021-04-23 MED ORDER — PROPOFOL 10 MG/ML IV BOLUS
INTRAVENOUS | Status: AC
  Filled 2021-04-23: qty 20

## 2021-04-23 MED ORDER — OXYCODONE HCL 5 MG PO TABS: 5.0000 mg | ORAL_TABLET | Freq: Once | ORAL | Status: DC | PRN

## 2021-04-23 MED ORDER — CHLORHEXIDINE GLUCONATE 4 % EX LIQD: 60.0000 mL | Freq: Once | CUTANEOUS | Status: DC

## 2021-04-23 MED ORDER — FENTANYL CITRATE (PF) 100 MCG/2ML IJ SOLN
INTRAMUSCULAR | Status: AC
  Administered 2021-04-23: 50 ug
  Filled 2021-04-23: qty 2

## 2021-04-23 MED ORDER — FENTANYL CITRATE (PF) 100 MCG/2ML IJ SOLN: 25.0000 ug | INTRAMUSCULAR | Status: DC | PRN

## 2021-04-23 MED ORDER — ONDANSETRON HCL 4 MG/2ML IJ SOLN: 4.0000 mg | Freq: Once | INTRAMUSCULAR | Status: DC | PRN

## 2021-04-23 MED ORDER — AMISULPRIDE (ANTIEMETIC) 5 MG/2ML IV SOLN: 10.0000 mg | Freq: Once | INTRAVENOUS | Status: DC | PRN

## 2021-04-23 MED ORDER — CEFAZOLIN SODIUM-DEXTROSE 2-4 GM/100ML-% IV SOLN
2.0000 g | INTRAVENOUS | Status: AC
  Administered 2021-04-23: 2 g via INTRAVENOUS
  Filled 2021-04-23: qty 100

## 2021-04-23 MED ORDER — 0.9 % SODIUM CHLORIDE (POUR BTL) OPTIME
TOPICAL | Status: DC | PRN
  Administered 2021-04-23: 1000 mL

## 2021-04-23 MED ORDER — LABETALOL HCL 5 MG/ML IV SOLN
INTRAVENOUS | Status: AC
  Filled 2021-04-23: qty 4

## 2021-04-23 SURGICAL SUPPLY — 36 items
ARMBAND PINK RESTRICT EXTREMIT (MISCELLANEOUS) ×2 IMPLANT
BAG COUNTER SPONGE SURGICOUNT (BAG) ×2 IMPLANT
BLADE CLIPPER SURG (BLADE) ×2 IMPLANT
BNDG ELASTIC 4X5.8 VLCR STR LF (GAUZE/BANDAGES/DRESSINGS) ×1 IMPLANT
CANISTER SUCT 3000ML PPV (MISCELLANEOUS) ×2 IMPLANT
CLIP LIGATING EXTRA MED SLVR (CLIP) ×2 IMPLANT
CLIP LIGATING EXTRA SM BLUE (MISCELLANEOUS) ×2 IMPLANT
CLIP VESOCCLUDE MED 6/CT (CLIP) ×2 IMPLANT
COVER PROBE W GEL 5X96 (DRAPES) ×2 IMPLANT
DECANTER SPIKE VIAL GLASS SM (MISCELLANEOUS) ×2 IMPLANT
DERMABOND ADVANCED (GAUZE/BANDAGES/DRESSINGS) ×1
DERMABOND ADVANCED .7 DNX12 (GAUZE/BANDAGES/DRESSINGS) ×1 IMPLANT
ELECT REM PT RETURN 9FT ADLT (ELECTROSURGICAL) ×2
ELECTRODE REM PT RTRN 9FT ADLT (ELECTROSURGICAL) ×1 IMPLANT
GLOVE SRG 8 PF TXTR STRL LF DI (GLOVE) ×2 IMPLANT
GLOVE SURG POLYISO LF SZ8 (GLOVE) IMPLANT
GLOVE SURG UNDER POLY LF SZ8 (GLOVE) ×4
GOWN STRL REUS W/ TWL LRG LVL3 (GOWN DISPOSABLE) ×2 IMPLANT
GOWN STRL REUS W/TWL 2XL LVL3 (GOWN DISPOSABLE) ×2 IMPLANT
GOWN STRL REUS W/TWL LRG LVL3 (GOWN DISPOSABLE) ×4
KIT BASIN OR (CUSTOM PROCEDURE TRAY) ×2 IMPLANT
KIT TURNOVER KIT B (KITS) ×2 IMPLANT
LIGACLIP SM TITANIUM (CLIP) ×1 IMPLANT
NS IRRIG 1000ML POUR BTL (IV SOLUTION) ×2 IMPLANT
PACK CV ACCESS (CUSTOM PROCEDURE TRAY) ×2 IMPLANT
PAD ARMBOARD 7.5X6 YLW CONV (MISCELLANEOUS) ×4 IMPLANT
SUT MNCRL AB 4-0 PS2 18 (SUTURE) ×2 IMPLANT
SUT PROLENE 6 0 BV (SUTURE) ×2 IMPLANT
SUT PROLENE 7 0 BV 1 (SUTURE) IMPLANT
SUT SILK 2 0 SH (SUTURE) ×2 IMPLANT
SUT SILK 3 0 SH CR/8 (SUTURE) ×3 IMPLANT
SUT VIC AB 3-0 SH 27 (SUTURE) ×4
SUT VIC AB 3-0 SH 27X BRD (SUTURE) ×1 IMPLANT
TOWEL GREEN STERILE (TOWEL DISPOSABLE) ×2 IMPLANT
UNDERPAD 30X36 HEAVY ABSORB (UNDERPADS AND DIAPERS) ×2 IMPLANT
WATER STERILE IRR 1000ML POUR (IV SOLUTION) ×2 IMPLANT

## 2021-04-23 NOTE — Anesthesia Postprocedure Evaluation (Signed)
Anesthesia Post Note  Patient: Peter Oneill  Procedure(s) Performed: LEFT ARM  FISTULA CREATION (Left)     Patient location during evaluation: PACU Anesthesia Type: Regional and MAC Level of consciousness: awake and alert Pain management: pain level controlled Vital Signs Assessment: post-procedure vital signs reviewed and stable Respiratory status: spontaneous breathing and respiratory function stable Cardiovascular status: stable Postop Assessment: no apparent nausea or vomiting Anesthetic complications: no   No notable events documented.  Last Vitals:  Vitals:   04/23/21 1303 04/23/21 1318  BP: 124/69 (!) 158/75  Pulse: 60 (!) 55  Resp: 10 20  Temp: (!) 36.3 C (!) 36.3 C  SpO2: 92% 92%    Last Pain:  Vitals:   04/23/21 1318  TempSrc:   PainSc: 0-No pain                 [name]

## 2021-04-23 NOTE — Progress Notes (Signed)
Patient's monitor displaying A-Fib, patient denies any symptoms.  Dr. Elgie Congo notified and gave verbal order for repeat EKG.

## 2021-04-23 NOTE — Progress Notes (Signed)
Orthopedic Tech Progress Note Patient Details:  Peter Oneill Nov 17, 1940 [id number]  PACU RN called requesting an ARM SLING for patient  Ortho Devices Type of Ortho Device: Arm sling Ortho Device/Splint Location: LUE Ortho Device/Splint Interventions: Ordered, Application, Adjustment   Post Interventions Patient Tolerated: Well Instructions Provided: Care of device  Peter Oneill 04/23/2021, 1:58 PM

## 2021-04-23 NOTE — Op Note (Signed)
NAME: Peter Oneill    MRN: [id number] DOB: [birth year]    DATE OF OPERATION: 04/23/2021  PREOP DIAGNOSIS:    Chronic kidney disease, stage IV  POSTOP DIAGNOSIS:    Chronic kidney disease, stage IV  PROCEDURE:    Left brachiocephalic fistula creation  SURGEON: Broadus John  ASSIST: [name], PA  ANESTHESIA: Moderate, block  EBL: 5 mL  INDICATIONS:    Peter Oneill is a 81 y.o. male who presented to my office for permanent HD access due chronic kidney disease stage 4   Based on vein mapping and examination, patient has usable vein in bilateral upper extremities.  Being that he is right-handed, he would benefit most from left-sided brachiocephalic fistula creation I had an extensive discussion with this patient in regards to the nature of access surgery, including risk, benefits, and alternatives.   The patient is aware that the risks of access surgery include but are not limited to: bleeding, infection, steal syndrome, nerve damage, ischemic monomelic neuropathy, failure of access to mature, complications related to venous hypertension, and possible need for additional access procedures in the future. I discussed with the patient the nature of the staged access procedure, specifically the need for a second operation to transpose the first stage fistula if it matures adequately.   The patient has agreed to proceed with the above procedure which will be scheduled.  FINDINGS:   8 mm left cephalic vein 4 mm brachial artery  TECHNIQUE:   A left upper extremity block was performed by the anesthesia team. The patient was brought to the operating room and placed in supine position. The left arm was prepped and draped in standard fashion. IV antibiotics were administered. A timeout was performed.   The case began with ultrasound insonation of the brachial artery and cephalic vein, which demonstrated sufficient size at the antecubital fossa for arteriovenous fistula.    A transverse incision was made below the elbow creese in the antecubital fossa. The  cephalic vein was isolated for 3 cm in length. Next the aponeurosis was partially released and the brachial artery secured with a vessel loop. The patient was heparinized. The cephalic vein was transected and ligated distally with a 2-0 silk stick-tie. The vein was dilated with coronary dilators and flushed with heparin saline. Vascular clamps were placed proximally and distally on the brachial artery and a 5 mm arteriotomy was created on the brachial artery. This was flushed with heparin saline.  An anastomosis was created in end to side fashion on the brachial artery using running 6-0 Prolene suture.  Prior to completing the anastomosis, the vessels were flushed and the suture line was tied down. There was an excellent thrill in the cephalic vein from the anastomosis to the mid upper bicipital region. The patient had a multiphasic radial and ulnar signal. He had an excellent doppler signal in the fistula. The incision was irrigated and hemostasis achieved with cautery and suture. The deeper tissue was closed with 3-0 Vicryl and the skin closed with 4-0 Monocryl.  Dermabond was applied to the incisions. The patient was transferred to PACU in stable condition.  Given the complexity of the case a first assistant was necessary in order to expedite the procedure and safely perform the technical aspects of the operation. This included traction and counter traction, vein exposure, arterial exposure and fistula anastomosis. Peter Oneill role was critical.  Peter Burows, MD [facility]  DATE OF DICTATION:   04/23/2021

## 2021-04-23 NOTE — Discharge Instructions (Signed)
° °  [facility] ° °Discharge Instructions ° °AV Fistula or Graft Surgery for Dialysis Access ° °Please refer to the following instructions for your post-procedure care. Your surgeon or physician assistant will discuss any changes with you. ° °Activity ° °You may drive the day following your surgery, if you are comfortable and no longer taking prescription pain medication. Resume full activity as the soreness in your incision resolves. ° °Bathing/Showering ° °You may shower after you go home. Keep your incision dry for 48 hours. Do not soak in a bathtub, hot tub, or swim until the incision heals completely. You may not shower if you have a hemodialysis catheter. ° °Incision Care ° °Clean your incision with mild soap and water after 48 hours. Pat the area dry with a clean towel. You do not need a bandage unless otherwise instructed. Do not apply any ointments or creams to your incision. You may have skin glue on your incision. Do not peel it off. It will come off on its own in about one week. Your arm may swell a bit after surgery. To reduce swelling use pillows to elevate your arm so it is above your heart. Your doctor will tell you if you need to lightly wrap your arm with an ACE bandage. ° °Diet ° °Resume your normal diet. There are not special food restrictions following this procedure. In order to heal from your surgery, it is CRITICAL to get adequate nutrition. Your body requires vitamins, minerals, and protein. Vegetables are the best source of vitamins and minerals. Vegetables also provide the perfect balance of protein. Processed food has little nutritional value, so try to avoid this. ° °Medications ° °Resume taking all of your medications. If your incision is causing pain, you may take over-the counter pain relievers such as acetaminophen (Tylenol). If you were prescribed a stronger pain medication, please be aware these medications can cause nausea and constipation. Prevent  nausea by taking the medication with a snack or meal. Avoid constipation by drinking plenty of fluids and eating foods with high amount of fiber, such as fruits, vegetables, and grains. Do not take Tylenol if you are taking prescription pain medications. ° ° ° ° °Follow up °Your surgeon may want to see you in the office following your access surgery. If so, this will be arranged at the time of your surgery. ° °Please call us immediately for any of the following conditions: ° °Increased pain, redness, drainage (pus) from your incision site °Fever of 101 degrees or higher °Severe or worsening pain at your incision site °Hand pain or numbness. ° °Reduce your risk of vascular disease: ° °Stop smoking. If you would like help, call [facility] at 1-800-QUIT-NOW (1-800-784-8669) or [place] at 336-586-4000 ° °Manage your cholesterol °Maintain a desired weight °Control your diabetes °Keep your blood pressure down ° °Dialysis ° °It will take several weeks to several months for your new dialysis access to be ready for use. Your surgeon will determine when it is OK to use it. Your nephrologist will continue to direct your dialysis. You can continue to use your Permcath until your new access is ready for use. ° °If you have any questions, please call the office at [phone]. ° °

## 2021-04-23 NOTE — H&P (Signed)
Office Note   Patient seen and examined in preop holding.  No complaints. No changes to medication history or physical exam since last seen in clinic. After discussing the risks and benefits of left arm AV fistula , Markeise Mathews elected to proceed.   Broadus John MD   CC:  ESRD Requesting Provider:  No ref. provider found  HPI: Peter Oneill is a Right handed 81 y.o. (06/26/1940) male with kidney disease who presents at the request of No ref. provider found for permanent HD access. The patient has had no prior access procedures.  Not currently on dialysis, chronic kidney disease-stage IV.  Medical history includes, CABG 2003, coronary stenting 2004, repeat CABG 2016, repeat stenting 2017.  Also with COPD, CKD 4, bilateral carpal tunnel release.  On exam, Keshon was doing well, accompanied by his wife.  His daughter was on speaker phone.  He had no complaints other than significant weight gain. 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  His wife is a Marine scientist by trade, daughter works in Corporate treasurer.  Ridhaan was asked to present today for fistula discussion.  He had no prior knowledge of dialysis.    The pt is  on a statin for cholesterol management.  The pt is  on a daily aspirin.   Other AC:  plavix The pt is  on medications for hypertension.   The pt is not diabetic. Tobacco hx:  -  Past Medical History:  Diagnosis Date   Asbestosis ([place])    Contraindication to percutaneous coronary intervention (PCI)    Coronary artery disease    Dyslipidemia    Dyspnea    History of anemia    Hypercholesterolemia    Hypertension    Ileus ([facility])    Pleural effusion on left    Pneumonia    Renal failure    Sleep apnea     Past Surgical History:  Procedure Laterality Date   BACK SURGERY     CARDIAC CATHETERIZATION     CAROTID ENDARTERECTOMY     CARPAL TUNNEL RELEASE     CORONARY ARTERY BYPASS GRAFT     HERNIA REPAIR     PERCUTANEOUS  CORONARY STENT INTERVENTION (PCI-S)     multiple   STERNOTOMY      Social History   Socioeconomic History   Marital status: Married    Spouse name: Not on file   Number of children: Not on file   Years of education: Not on file   Highest education level: Not on file  Occupational History   Not on file  Tobacco Use   Smoking status: Former    Packs/day: 0.25    Years: 19.00    Pack years: 4.75    Types: Cigarettes    Quit date: 12/10/1976    Years since quitting: 44.3   Smokeless tobacco: Never  Vaping Use   Vaping Use: Never used  Substance and Sexual Activity   Alcohol use: Yes    Comment: occasional   Drug use: No   Sexual activity: Yes    Comment: married  Other Topics Concern   Not on file  Social History Narrative   Not on file   Social Determinants of Health   Financial Resource Strain: Not on file  Food Insecurity: Not on file  Transportation Needs: Not on file  Physical Activity: Not on file  Stress: Not on file  Social Connections: Not on file  Intimate Partner Violence: Not  on file   Family History  Problem Relation Age of Onset   Heart disease Father     Current Facility-Administered Medications  Medication Dose Route Frequency Provider Last Rate Last Admin   0.9 %  sodium chloride infusion   Intravenous Continuous Broadus John, MD 10 mL/hr at 04/23/21 0832 New Bag at 04/23/21 0832   ceFAZolin (ANCEF) IVPB 2g/100 mL premix  2 g Intravenous 30 min Pre-Op Broadus John, MD       chlorhexidine (HIBICLENS) 4 % liquid 4 application  60 mL Topical Once Broadus John, MD       And   [START ON 04/24/2021] chlorhexidine (HIBICLENS) 4 % liquid 4 application  60 mL Topical Once Broadus John, MD       fentaNYL (SUBLIMAZE) injection 50 mcg  50 mcg Intravenous Once [name], MD       Facility-Administered Medications Ordered in Other Encounters  Medication Dose Route Frequency Provider Last Rate Last Admin   ropivacaine (PF) 5 mg/mL (0.5%)  (NAROPIN) injection   Peri-NEURAL Anesthesia Intra-op [name], MD   30 mL at 04/23/21 0900    Allergies  Allergen Reactions   Atorvastatin Other (See Comments)    Muscle aches     REVIEW OF SYSTEMS:  [X]  denotes positive finding, [ ]  denotes negative finding Cardiac  Comments:  Chest pain or chest pressure:    Shortness of breath upon exertion:    Short of breath when lying flat:    Irregular heart rhythm:        Vascular    Pain in calf, thigh, or hip brought on by ambulation:    Pain in feet at night that wakes you up from your sleep:     Blood clot in your veins:    Leg swelling:         Pulmonary    Oxygen at home:    Productive cough:     Wheezing:         Neurologic    Sudden weakness in arms or legs:     Sudden numbness in arms or legs:     Sudden onset of difficulty speaking or slurred speech:    Temporary loss of vision in one eye:     Problems with dizziness:         Gastrointestinal    Blood in stool:     Vomited blood:         Genitourinary    Burning when urinating:     Blood in urine:        Psychiatric    Major depression:         Hematologic    Bleeding problems:    Problems with blood clotting too easily:        Skin    Rashes or ulcers:        Constitutional    Fever or chills:      PHYSICAL EXAMINATION:  Vitals:   04/22/21 1021 04/23/21 0749 04/23/21 0840 04/23/21 0845  BP:  (!) 170/80 (!) 180/73 (!) 194/80  Pulse:  61 (!) 134 65  Resp:  20 18 19   Temp:  97.9 F (36.6 C)    TempSrc:  Oral    SpO2:  98% 100% 97%  Weight: 100.2 kg 99.8 kg    Height: 5\' 9"  (1.753 m) 5\' 9"  (1.753 m)      General:  WDWN in NAD; vital signs documented above Gait: Not observed  HENT: WNL, normocephalic Pulmonary: normal non-labored breathing , without Rales, rhonchi,  wheezing Cardiac: regular HR Abdomen: soft, NT, no masses Skin: without rashes Vascular Exam/Pulses:  Right Left  Radial 2+ (normal) 2+ (normal)  Ulnar 2+ (normal) 2+  (normal)                   Extremities: without ischemic changes, without Gangrene , without cellulitis; without open wounds;  Musculoskeletal: no muscle wasting or atrophy  Neurologic: A&O X 3;  No focal weakness or paresthesias are detected Psychiatric:  The pt has Normal affect.   Non-Invasive Vascular Imaging:   Sufficient diameter of bilateral cephalic and basilic veins for fistula creation The patient diameter of brachial artery at the antecubital fossa for fistula creation    ASSESSMENT/PLAN:  Neema Fluegge is a 81 y.o. male who presents with chronic kidney disease stage 4  Based on vein mapping and examination, patient has usable vein in bilateral upper extremities.  Being that he is right-handed, he would benefit most from left-sided brachiocephalic fistula creation I had an extensive discussion with this patient in regards to the nature of access surgery, including risk, benefits, and alternatives.   The patient is aware that the risks of access surgery include but are not limited to: bleeding, infection, steal syndrome, nerve damage, ischemic monomelic neuropathy, failure of access to mature, complications related to venous hypertension, and possible need for additional access procedures in the future. I discussed with the patient the nature of the staged access procedure, specifically the need for a second operation to transpose the first stage fistula if it matures adequately.   The patient has agreed to proceed with the above procedure which will be scheduled.  Broadus John, MD [facility] [phone]

## 2021-04-23 NOTE — Anesthesia Procedure Notes (Addendum)
°  Anesthesia Regional Block: Supraclavicular block   Pre-Anesthetic Checklist: , timeout performed,  Correct Patient, Correct Site, Correct Laterality,  Correct Procedure, Correct Position, site marked,  Risks and benefits discussed,  Surgical consent,  Pre-op evaluation,  At surgeon's request and post-op pain management  Laterality: Left  Prep: chloraprep       Needles:  Injection technique: Single-shot  Needle Type: Echogenic Stimulator Needle     Needle Length: 10cm  Needle Gauge: 20     Additional Needles:   Procedures:,,,, ultrasound used (permanent image in chart),,    Narrative:  Start time: 04/23/2021 9:05 AM End time: 04/23/2021 9:10 AM Injection made incrementally with aspirations every 5 mL.  Performed by: Personally  Anesthesiologist: [name], MD

## 2021-04-23 NOTE — Transfer of Care (Signed)
Immediate Anesthesia Transfer of Care Note  Patient: Peter Oneill  Procedure(s) Performed: LEFT ARM  FISTULA CREATION (Left)  Patient Location: PACU  Anesthesia Type:MAC combined with regional for post-op pain  Level of Consciousness: drowsy and patient cooperative  Airway & Oxygen Therapy: Patient Spontanous Breathing  Post-op Assessment: Report given to RN and Post -op Vital signs reviewed and stable  Post vital signs: Reviewed and stable  Last Vitals:  Vitals Value Taken Time  BP 124/69 04/23/21 1303  Temp    Pulse 63 04/23/21 1303  Resp 8 04/23/21 1303  SpO2 93 % 04/23/21 1303  Vitals shown include unvalidated device data.  Last Pain:  Vitals:   04/23/21 0804  TempSrc:   PainSc: 0-No pain         Complications: No notable events documented.

## 2021-04-24 ENCOUNTER — Encounter (HOSPITAL_COMMUNITY): Payer: Self-pay | Admitting: Vascular Surgery

## 2021-04-24 NOTE — Addendum Note (Signed)
Addendum  created 04/24/21 1501 by [name], MD  ? Clinical Note Signed, Intraprocedure Blocks edited  ?  ?

## 2021-05-13 ENCOUNTER — Other Ambulatory Visit: Payer: Self-pay

## 2021-05-13 ENCOUNTER — Emergency Department (HOSPITAL_COMMUNITY): Payer: Medicare HMO

## 2021-05-13 ENCOUNTER — Inpatient Hospital Stay (HOSPITAL_COMMUNITY): Payer: Medicare HMO

## 2021-05-13 ENCOUNTER — Encounter (HOSPITAL_COMMUNITY): Payer: Self-pay | Admitting: Emergency Medicine

## 2021-05-13 ENCOUNTER — Inpatient Hospital Stay (HOSPITAL_COMMUNITY)
Admission: EM | Admit: 2021-05-13 | Discharge: 2021-05-19 | DRG: 291 | Disposition: A | Discharge status: Home Health | Payer: Medicare HMO | Attending: Family Medicine | Admitting: Family Medicine

## 2021-05-13 ENCOUNTER — Encounter (HOSPITAL_COMMUNITY)
Admission: EM | Disposition: A | Discharge status: Home Health | Payer: Self-pay | Source: Home / Self Care | Attending: Internal Medicine

## 2021-05-13 DIAGNOSIS — K5900 Constipation, unspecified: Secondary | ICD-10-CM | POA: Diagnosis not present

## 2021-05-13 DIAGNOSIS — J984 Other disorders of lung: Secondary | ICD-10-CM | POA: Diagnosis present

## 2021-05-13 DIAGNOSIS — Z20822 Contact with and (suspected) exposure to covid-19: Secondary | ICD-10-CM | POA: Diagnosis present

## 2021-05-13 DIAGNOSIS — Z951 Presence of aortocoronary bypass graft: Secondary | ICD-10-CM

## 2021-05-13 DIAGNOSIS — I2510 Atherosclerotic heart disease of native coronary artery without angina pectoris: Secondary | ICD-10-CM | POA: Diagnosis present

## 2021-05-13 DIAGNOSIS — I132 Hypertensive heart and chronic kidney disease with heart failure and with stage 5 chronic kidney disease, or end stage renal disease: Secondary | ICD-10-CM | POA: Diagnosis present

## 2021-05-13 DIAGNOSIS — D631 Anemia in chronic kidney disease: Secondary | ICD-10-CM | POA: Diagnosis present

## 2021-05-13 DIAGNOSIS — Z888 Allergy status to other drugs, medicaments and biological substances status: Secondary | ICD-10-CM

## 2021-05-13 DIAGNOSIS — E669 Obesity, unspecified: Secondary | ICD-10-CM | POA: Diagnosis present

## 2021-05-13 DIAGNOSIS — R001 Bradycardia, unspecified: Secondary | ICD-10-CM

## 2021-05-13 DIAGNOSIS — Z515 Encounter for palliative care: Secondary | ICD-10-CM | POA: Diagnosis not present

## 2021-05-13 DIAGNOSIS — Z7982 Long term (current) use of aspirin: Secondary | ICD-10-CM

## 2021-05-13 DIAGNOSIS — Z789 Other specified health status: Secondary | ICD-10-CM | POA: Diagnosis not present

## 2021-05-13 DIAGNOSIS — N289 Disorder of kidney and ureter, unspecified: Secondary | ICD-10-CM | POA: Diagnosis not present

## 2021-05-13 DIAGNOSIS — E8779 Other fluid overload: Secondary | ICD-10-CM

## 2021-05-13 DIAGNOSIS — J61 Pneumoconiosis due to asbestos and other mineral fibers: Secondary | ICD-10-CM | POA: Diagnosis present

## 2021-05-13 DIAGNOSIS — I25118 Atherosclerotic heart disease of native coronary artery with other forms of angina pectoris: Secondary | ICD-10-CM | POA: Diagnosis not present

## 2021-05-13 DIAGNOSIS — L89323 Pressure ulcer of left buttock, stage 3: Secondary | ICD-10-CM | POA: Diagnosis present

## 2021-05-13 DIAGNOSIS — E8770 Fluid overload, unspecified: Secondary | ICD-10-CM | POA: Diagnosis not present

## 2021-05-13 DIAGNOSIS — G4733 Obstructive sleep apnea (adult) (pediatric): Secondary | ICD-10-CM | POA: Diagnosis present

## 2021-05-13 DIAGNOSIS — I459 Conduction disorder, unspecified: Secondary | ICD-10-CM | POA: Diagnosis not present

## 2021-05-13 DIAGNOSIS — E7800 Pure hypercholesterolemia, unspecified: Secondary | ICD-10-CM | POA: Diagnosis present

## 2021-05-13 DIAGNOSIS — N179 Acute kidney failure, unspecified: Secondary | ICD-10-CM | POA: Diagnosis present

## 2021-05-13 DIAGNOSIS — D696 Thrombocytopenia, unspecified: Secondary | ICD-10-CM | POA: Diagnosis present

## 2021-05-13 DIAGNOSIS — L89313 Pressure ulcer of right buttock, stage 3: Secondary | ICD-10-CM | POA: Diagnosis present

## 2021-05-13 DIAGNOSIS — Z8249 Family history of ischemic heart disease and other diseases of the circulatory system: Secondary | ICD-10-CM

## 2021-05-13 DIAGNOSIS — I5033 Acute on chronic diastolic (congestive) heart failure: Secondary | ICD-10-CM | POA: Diagnosis present

## 2021-05-13 DIAGNOSIS — L8990 Pressure ulcer of unspecified site, unspecified stage: Secondary | ICD-10-CM | POA: Insufficient documentation

## 2021-05-13 DIAGNOSIS — E872 Acidosis, unspecified: Secondary | ICD-10-CM | POA: Diagnosis present

## 2021-05-13 DIAGNOSIS — Z992 Dependence on renal dialysis: Secondary | ICD-10-CM

## 2021-05-13 DIAGNOSIS — N186 End stage renal disease: Secondary | ICD-10-CM

## 2021-05-13 DIAGNOSIS — K219 Gastro-esophageal reflux disease without esophagitis: Secondary | ICD-10-CM | POA: Diagnosis present

## 2021-05-13 DIAGNOSIS — I5043 Acute on chronic combined systolic (congestive) and diastolic (congestive) heart failure: Secondary | ICD-10-CM | POA: Diagnosis not present

## 2021-05-13 DIAGNOSIS — Z79899 Other long term (current) drug therapy: Secondary | ICD-10-CM

## 2021-05-13 DIAGNOSIS — Z955 Presence of coronary angioplasty implant and graft: Secondary | ICD-10-CM

## 2021-05-13 DIAGNOSIS — Z7902 Long term (current) use of antithrombotics/antiplatelets: Secondary | ICD-10-CM

## 2021-05-13 DIAGNOSIS — J449 Chronic obstructive pulmonary disease, unspecified: Secondary | ICD-10-CM | POA: Diagnosis present

## 2021-05-13 DIAGNOSIS — Z6832 Body mass index (BMI) 32.0-32.9, adult: Secondary | ICD-10-CM

## 2021-05-13 DIAGNOSIS — I441 Atrioventricular block, second degree: Secondary | ICD-10-CM | POA: Diagnosis not present

## 2021-05-13 DIAGNOSIS — E875 Hyperkalemia: Secondary | ICD-10-CM | POA: Diagnosis present

## 2021-05-13 DIAGNOSIS — I10 Essential (primary) hypertension: Secondary | ICD-10-CM | POA: Diagnosis present

## 2021-05-13 DIAGNOSIS — Z87891 Personal history of nicotine dependence: Secondary | ICD-10-CM

## 2021-05-13 DIAGNOSIS — I509 Heart failure, unspecified: Secondary | ICD-10-CM

## 2021-05-13 DIAGNOSIS — Z77090 Contact with and (suspected) exposure to asbestos: Secondary | ICD-10-CM | POA: Diagnosis present

## 2021-05-13 DIAGNOSIS — I442 Atrioventricular block, complete: Secondary | ICD-10-CM | POA: Diagnosis present

## 2021-05-13 DIAGNOSIS — Z66 Do not resuscitate: Secondary | ICD-10-CM | POA: Diagnosis not present

## 2021-05-13 DIAGNOSIS — I779 Disorder of arteries and arterioles, unspecified: Secondary | ICD-10-CM | POA: Diagnosis present

## 2021-05-13 LAB — CBC WITH DIFFERENTIAL/PLATELET
Abs Immature Granulocytes: 0.03 10*3/uL (ref 0.00–0.07)
Basophils Absolute: 0 10*3/uL (ref 0.0–0.1)
Basophils Relative: 0 %
Eosinophils Absolute: 0.1 10*3/uL (ref 0.0–0.5)
Eosinophils Relative: 1 %
HCT: 29.9 % — ABNORMAL LOW (ref 39.0–52.0)
Hemoglobin: 10.1 g/dL — ABNORMAL LOW (ref 13.0–17.0)
Immature Granulocytes: 1 %
Lymphocytes Relative: 7 %
Lymphs Abs: 0.4 10*3/uL — ABNORMAL LOW (ref 0.7–4.0)
MCH: 31.7 pg (ref 26.0–34.0)
MCHC: 33.8 g/dL (ref 30.0–36.0)
MCV: 93.7 fL (ref 80.0–100.0)
Monocytes Absolute: 0.3 10*3/uL (ref 0.1–1.0)
Monocytes Relative: 5 %
Neutro Abs: 4.3 10*3/uL (ref 1.7–7.7)
Neutrophils Relative %: 86 %
Platelets: 72 10*3/uL — ABNORMAL LOW (ref 150–400)
RBC: 3.19 MIL/uL — ABNORMAL LOW (ref 4.22–5.81)
RDW: 17.1 % — ABNORMAL HIGH (ref 11.5–15.5)
WBC: 5 10*3/uL (ref 4.0–10.5)
nRBC: 2.4 % — ABNORMAL HIGH (ref 0.0–0.2)

## 2021-05-13 LAB — COMPREHENSIVE METABOLIC PANEL
ALT: 14 U/L (ref 0–44)
AST: 25 U/L (ref 15–41)
Albumin: 2.9 g/dL — ABNORMAL LOW (ref 3.5–5.0)
Alkaline Phosphatase: 76 U/L (ref 38–126)
Anion gap: 21 — ABNORMAL HIGH (ref 5–15)
BUN: 174 mg/dL — ABNORMAL HIGH (ref 8–23)
CO2: 14 mmol/L — ABNORMAL LOW (ref 22–32)
Calcium: 8.1 mg/dL — ABNORMAL LOW (ref 8.9–10.3)
Chloride: 104 mmol/L (ref 98–111)
Creatinine, Ser: 8.91 mg/dL — ABNORMAL HIGH (ref 0.61–1.24)
GFR, Estimated: 6 mL/min — ABNORMAL LOW (ref >60)
Glucose, Bld: 134 mg/dL — ABNORMAL HIGH (ref 70–99)
Potassium: 5.2 mmol/L — ABNORMAL HIGH (ref 3.5–5.1)
Sodium: 139 mmol/L (ref 135–145)
Total Bilirubin: 0.7 mg/dL (ref 0.3–1.2)
Total Protein: 5.9 g/dL — ABNORMAL LOW (ref 6.5–8.1)

## 2021-05-13 LAB — I-STAT VENOUS BLOOD GAS, ED
Acid-base deficit: 10 mmol/L — ABNORMAL HIGH (ref 0.0–2.0)
Bicarbonate: 14.2 mmol/L — ABNORMAL LOW (ref 20.0–28.0)
Calcium, Ion: 0.84 mmol/L — CL (ref 1.15–1.40)
HCT: 31 % — ABNORMAL LOW (ref 39.0–52.0)
Hemoglobin: 10.5 g/dL — ABNORMAL LOW (ref 13.0–17.0)
O2 Saturation: 100 %
Potassium: 4.9 mmol/L (ref 3.5–5.1)
Sodium: 134 mmol/L — ABNORMAL LOW (ref 135–145)
TCO2: 15 mmol/L — ABNORMAL LOW (ref 22–32)
pCO2, Ven: 26.6 mmHg — ABNORMAL LOW (ref 44–60)
pH, Ven: 7.336 (ref 7.25–7.43)
pO2, Ven: 201 mmHg — ABNORMAL HIGH (ref 32–45)

## 2021-05-13 LAB — RENAL FUNCTION PANEL
Albumin: 2.9 g/dL — ABNORMAL LOW (ref 3.5–5.0)
Anion gap: 19 — ABNORMAL HIGH (ref 5–15)
BUN: 173 mg/dL — ABNORMAL HIGH (ref 8–23)
CO2: 16 mmol/L — ABNORMAL LOW (ref 22–32)
Calcium: 8.1 mg/dL — ABNORMAL LOW (ref 8.9–10.3)
Chloride: 104 mmol/L (ref 98–111)
Creatinine, Ser: 9.16 mg/dL — ABNORMAL HIGH (ref 0.61–1.24)
GFR, Estimated: 5 mL/min — ABNORMAL LOW (ref >60)
Glucose, Bld: 120 mg/dL — ABNORMAL HIGH (ref 70–99)
Phosphorus: >30 mg/dL — ABNORMAL HIGH (ref 2.5–4.6)
Potassium: 5 mmol/L (ref 3.5–5.1)
Sodium: 139 mmol/L (ref 135–145)

## 2021-05-13 LAB — I-STAT CHEM 8, ED
BUN: >130 mg/dL — ABNORMAL HIGH (ref 8–23)
Calcium, Ion: 0.97 mmol/L — ABNORMAL LOW (ref 1.15–1.40)
Chloride: 105 mmol/L (ref 98–111)
Creatinine, Ser: 10.1 mg/dL — ABNORMAL HIGH (ref 0.61–1.24)
Glucose, Bld: 124 mg/dL — ABNORMAL HIGH (ref 70–99)
HCT: 33 % — ABNORMAL LOW (ref 39.0–52.0)
Hemoglobin: 11.2 g/dL — ABNORMAL LOW (ref 13.0–17.0)
Potassium: 5.4 mmol/L — ABNORMAL HIGH (ref 3.5–5.1)
Sodium: 136 mmol/L (ref 135–145)
TCO2: 18 mmol/L — ABNORMAL LOW (ref 22–32)

## 2021-05-13 LAB — ECHOCARDIOGRAM COMPLETE
Area-P 1/2: 2.31 cm2
S' Lateral: 3 cm
Weight: 3840 oz

## 2021-05-13 LAB — TROPONIN I (HIGH SENSITIVITY)
Troponin I (High Sensitivity): 44 ng/L — ABNORMAL HIGH (ref <18)
Troponin I (High Sensitivity): 46 ng/L — ABNORMAL HIGH (ref <18)

## 2021-05-13 LAB — CBC
HCT: 29.3 % — ABNORMAL LOW (ref 39.0–52.0)
Hemoglobin: 9.7 g/dL — ABNORMAL LOW (ref 13.0–17.0)
MCH: 31.2 pg (ref 26.0–34.0)
MCHC: 33.1 g/dL (ref 30.0–36.0)
MCV: 94.2 fL (ref 80.0–100.0)
Platelets: 69 10*3/uL — ABNORMAL LOW (ref 150–400)
RBC: 3.11 MIL/uL — ABNORMAL LOW (ref 4.22–5.81)
RDW: 16.8 % — ABNORMAL HIGH (ref 11.5–15.5)
WBC: 4.6 10*3/uL (ref 4.0–10.5)
nRBC: 2.6 % — ABNORMAL HIGH (ref 0.0–0.2)

## 2021-05-13 LAB — BRAIN NATRIURETIC PEPTIDE: B Natriuretic Peptide: 1173 pg/mL — ABNORMAL HIGH (ref 0.0–100.0)

## 2021-05-13 LAB — CREATININE, SERUM
Creatinine, Ser: 8.87 mg/dL — ABNORMAL HIGH (ref 0.61–1.24)
GFR, Estimated: 6 mL/min — ABNORMAL LOW (ref >60)

## 2021-05-13 LAB — HEPATITIS B SURFACE ANTIGEN: Hepatitis B Surface Ag: NONREACTIVE

## 2021-05-13 LAB — RESP PANEL BY RT-PCR (FLU A&B, COVID) ARPGX2
Influenza A by PCR: NEGATIVE
Influenza B by PCR: NEGATIVE
SARS Coronavirus 2 by RT PCR: NEGATIVE

## 2021-05-13 LAB — MRSA NEXT GEN BY PCR, NASAL: MRSA by PCR Next Gen: NOT DETECTED

## 2021-05-13 LAB — HEPATITIS B SURFACE ANTIBODY,QUALITATIVE: Hep B S Ab: REACTIVE — AB

## 2021-05-13 LAB — MAGNESIUM: Magnesium: 3.8 mg/dL — ABNORMAL HIGH (ref 1.7–2.4)

## 2021-05-13 SURGERY — TEMPORARY PACEMAKER
Anesthesia: LOCAL

## 2021-05-13 MED ORDER — SODIUM CHLORIDE 0.9 % IV SOLN: 100.0000 mL | INTRAVENOUS | Status: DC | PRN

## 2021-05-13 MED ORDER — CALCIUM GLUCONATE-NACL 1-0.675 GM/50ML-% IV SOLN
1.0000 g | Freq: Once | INTRAVENOUS | Status: AC
  Administered 2021-05-13: 1000 mg via INTRAVENOUS
  Filled 2021-05-13: qty 50

## 2021-05-13 MED ORDER — DOPAMINE-DEXTROSE 3.2-5 MG/ML-% IV SOLN
5.0000 ug/kg/min | INTRAVENOUS | Status: DC
  Administered 2021-05-13: 5 ug/kg/min via INTRAVENOUS
  Administered 2021-05-14: 2.5 ug/kg/min via INTRAVENOUS
  Administered 2021-05-14: 5 ug/kg/min via INTRAVENOUS
  Administered 2021-05-16: 2.5 ug/kg/min via INTRAVENOUS
  Filled 2021-05-13 (×3): qty 250

## 2021-05-13 MED ORDER — ATROPINE SULFATE 1 MG/10ML IJ SOSY
1.0000 mg | PREFILLED_SYRINGE | INTRAMUSCULAR | Status: DC | PRN
  Filled 2021-05-13: qty 10

## 2021-05-13 MED ORDER — HEPARIN SODIUM (PORCINE) 5000 UNIT/ML IJ SOLN
5000.0000 [IU] | Freq: Three times a day (TID) | INTRAMUSCULAR | Status: DC
  Administered 2021-05-13 – 2021-05-16 (×9): 5000 [IU] via SUBCUTANEOUS
  Filled 2021-05-13 (×9): qty 1

## 2021-05-13 MED ORDER — HEPARIN SODIUM (PORCINE) 1000 UNIT/ML DIALYSIS
1000.0000 [IU] | INTRAMUSCULAR | Status: DC | PRN
  Administered 2021-05-14: 1000 [IU] via INTRAVENOUS_CENTRAL
  Filled 2021-05-13: qty 1

## 2021-05-13 MED ORDER — PERFLUTREN LIPID MICROSPHERE
1.0000 mL | INTRAVENOUS | Status: AC | PRN
  Administered 2021-05-13: 2 mL via INTRAVENOUS
  Filled 2021-05-13: qty 10

## 2021-05-13 MED ORDER — LIDOCAINE-PRILOCAINE 2.5-2.5 % EX CREA: 1.0000 "application " | TOPICAL_CREAM | CUTANEOUS | Status: DC | PRN

## 2021-05-13 MED ORDER — LIDOCAINE HCL (PF) 1 % IJ SOLN: 5.0000 mL | INTRAMUSCULAR | Status: DC | PRN

## 2021-05-13 MED ORDER — ATROPINE SULFATE 1 MG/10ML IJ SOSY
0.5000 mg | PREFILLED_SYRINGE | Freq: Once | INTRAMUSCULAR | Status: AC
  Administered 2021-05-13: 0.5 mg via INTRAVENOUS
  Filled 2021-05-13: qty 10

## 2021-05-13 MED ORDER — DOCUSATE SODIUM 100 MG PO CAPS
100.0000 mg | ORAL_CAPSULE | Freq: Two times a day (BID) | ORAL | Status: DC | PRN
Start: 2021-05-13 — End: 2021-05-19

## 2021-05-13 MED ORDER — PENTAFLUOROPROP-TETRAFLUOROETH EX AERO
1.0000 "application " | INHALATION_SPRAY | CUTANEOUS | Status: DC | PRN
  Filled 2021-05-13: qty 116

## 2021-05-13 MED ORDER — PANTOPRAZOLE SODIUM 40 MG PO TBEC
40.0000 mg | DELAYED_RELEASE_TABLET | Freq: Every day | ORAL | Status: DC
  Administered 2021-05-13 – 2021-05-19 (×7): 40 mg via ORAL
  Filled 2021-05-13 (×7): qty 1

## 2021-05-13 MED ORDER — POLYETHYLENE GLYCOL 3350 17 G PO PACK: 17.0000 g | PACK | Freq: Every day | ORAL | Status: DC | PRN

## 2021-05-13 MED ORDER — ALTEPLASE 2 MG IJ SOLR
2.0000 mg | Freq: Once | INTRAMUSCULAR | Status: DC | PRN
  Filled 2021-05-13: qty 2

## 2021-05-13 MED ORDER — CHLORHEXIDINE GLUCONATE CLOTH 2 % EX PADS
6.0000 | MEDICATED_PAD | Freq: Every day | CUTANEOUS | Status: DC
  Administered 2021-05-13 – 2021-05-19 (×7): 6 via TOPICAL

## 2021-05-13 MED ORDER — ONDANSETRON HCL 4 MG/2ML IJ SOLN: 4.0000 mg | Freq: Four times a day (QID) | INTRAMUSCULAR | Status: DC | PRN

## 2021-05-13 NOTE — Plan of Care (Signed)
  Problem: Clinical Measurements: Goal: Respiratory complications will improve Outcome: Progressing Goal: Cardiovascular complication will be avoided Outcome: Progressing   Problem: Nutrition: Goal: Adequate nutrition will be maintained Outcome: Progressing   Problem: Pain Managment: Goal: General experience of comfort will improve Outcome: Progressing   

## 2021-05-13 NOTE — ED Triage Notes (Signed)
Patient sent to ED from vascular for dialysis, patient has maturing fistula in left arm and tunneled catheter in right IJ, has not started dialysis yet, is complaining of shortness of breath. ?

## 2021-05-13 NOTE — H&P (Signed)
? ?NAME:  Peter Oneill, MRN:  [id number], DOB:  08-09-1940, LOS: 0 ?ADMISSION DATE:  05/13/2021, CONSULTATION DATE:  05/13/2021 ?REFERRING MD:  , CHIEF COMPLAINT:  Dyspnea, Bradycardia   ? ?History of Present Illness:  ?Peter Oneill is a/an 81 y.o. male with a past medical history of CKD5, HTN, OSA, asbestosis, CAD s/p CABG who presents to [facility] with worsening edema, SOB, volume overload. Is s/p RIJ TDC placement today with Dr. Augustin Coupe as an outpatient at [facility] but was sent directly here after catheter placement for dyspnea. Had been on lasix 147m BID w/ metolazone 172mdaily as an outpatient. Was found to be bradycardic here  in the ED on 3/20 with HR in the 20's, which responded to atropine, now in the 40's on Dopamine . ? Underlying heart Block vs QRS prolongation.  Plan is to initiate bedside HD today in the ICU  ? ?Pertinent  Medical History  ? ?Past Medical History:  ?Diagnosis Date  ? [place]  ? Contraindication to percutaneous coronary intervention (PCI)   ? Coronary artery disease   ? Dyslipidemia   ? Dyspnea   ? History of anemia   ? Hypercholesterolemia   ? Hypertension   ? [place]  ? Pleural effusion on left   ? Pneumonia   ? Renal failure   ? Sleep apnea   ? CABG 2003, coronary stenting 2004, repeat CABG 2016, repeat stenting 2017.  Also with COPD, CKD 4, bilateral carpal tunnel release. ? ?Significant Hospital Events: ?Including procedures, antibiotic start and stop dates in addition to other pertinent events   ?04/23/2021 left-sided brachiocephalic fistula creation ?05/13/2021 THGordonath placed by Dr. LiAugustin Coupeor emergent HD.  ?[facility] and [facility]adycardia into the 20's  requiring atropine and dopamine , ? Heart Block ( Cardiology consulted)  , volume overload.  ?First HD scheduled for today as bedside in the ICU ?Echo 03/2020 LVEF of 50-55% with anterior septal hypokinesis mild LVH as well as mildly enlarged RV with mildly reduced systolic function. ? ?Interim  History / Subjective:  ?Labs Reviewed : ?HGB 10.1/ WBC 5.0/ HCT 29.9/Platelets 72,000 ?Mag 3.8, Troponin 44, BNP 1,173 ?Na 139/ K 5.2/ CO2 14/ Creatinine 8.91, / BUN 174Calcium 8.1/ Albumin 2.9/ GAP 21/ GFR 6/ Glucose 134/ AST 25, ALT 14, Alk Phos 76, Total Bili 0.7 ? ?CXR 3/20 ?Chest x-ray showed patchy infiltrates in the right mid and both lower lung field suggesting atelectasis/pneumonia as well as small bilateral pleural effusions (left > right). ? ?Echocardiogram 04/11/2020: ?Impressions: ? 1. Left ventricular ejection fraction, by estimation, is 50 to 55%. The  ?left ventricle has low normal function. The left ventricle demonstrates  ?regional wall motion abnormalities. Anteroseptal hypokinesis. There is  ?mild left ventricular hypertrophy.  ?Left ventricular diastolic parameters are indeterminate.  ? 2. Right ventricular systolic function is mildly reduced. The right  ?ventricular size is mildly enlarged. Tricuspid regurgitation signal is  ?inadequate for assessing PA pressure.  ? 3. The mitral valve is grossly normal. No evidence of mitral valve  ?regurgitation.  ? 4. The aortic valve was not well visualized. Aortic valve regurgitation  ?is not visualized. No aortic stenosis is present.  ?_______________ ?  ?Carotid Ultrasound 03/29/2021: ?Summary: ?- Right Carotid: Velocities in the right ICA are consistent with a 40-59% stenosis. The ECA appears >50% stenosed.  ?- Left Carotid: Velocities in the left ICA are consistent with a 40-59% stenosis. The ECA appears >50% stenosed.  ?-  Vertebrals:  Bilateral vertebral arteries demonstrate antegrade flow. ?- Subclavians: Normal flow hemodynamics were seen in bilateral subclavian arteries. Poorly visualized.  ?  ? ?Objective   ?Blood pressure (!) 122/50, pulse (!) 156, temperature 98.5 ?F (36.9 ?C), resp. rate 15, weight 108.9 kg, SpO2 97 %. ?   ?   ? ?Intake/Output Summary (Last 24 hours) at 05/13/2021 1211 ?Last data filed at 05/13/2021 1135 ?Gross per 24 hour  ?Intake  42.65 ml  ?Output --  ?Net 42.65 ml  ? ?Filed Weights  ? 05/13/21 1200  ?Weight: 108.9 kg  ? ? ?Examination: ?General: Acute on chronically ill appearing elderly male lying in bed, in NAD ?HEENT: [place]/AT, MM pink/moist, PERRL,  ?Neuro: Alert and oriented x3, non-focal  ?CV: s1s2 regular rate and rhythm, no murmur, rubs, or gallops,  ?PULM:  Clear to ascultation no increased work fo breathing, no added breath sounds ?GI: soft, bowel sounds active in all 4 quadrants, non-tender, non-distended, tolerating oral diet ?Extremities: warm/dry, no edema  ?Skin: no rashes or lesions ? ? ?Resolved Hospital Problem list   ? ? ?Assessment & Plan:  ?ESRD ?Initiation of HD ?-Creatinine on admit 9.16, GFR 5 ?P: ?Initiate HD today (bedside in ICU) w/ dopamine support in the context of bradycardia.  ?Slow start protocol ?Daily BMET ?Dose Meds for GFR of < 15 ?MAP Goal > 65 mm Hg  ?Avoid nephrotoxic medications ? ?Bradycardia with concern for heart block ?-Type at this time unknown initially felt third-degree but patient responded to IV atropine  ?P: ?Cardiology consulted, appreciate assistance  ?Low dose dopamine to assist In hemodynamics during iHD  ?No further beta blocker  ?Continuous telemetry  ?Optimize electrolytes  ?Volume removal per HD ? ?Acute on chronic diastolic congestive heart failure  ?-Patient presented with concerns of acute CHF given progressive dyspnea, abdominal distention, lower extremity edema, and elevated BNP ?-Last echo 06/27/2020 revealed EF 50 to 55% with anterior septal hypokinesis and mild LVH ?History of CAD ?-Status post CABG 2003 and redo CABG times 04/15/2014 ?History of hypertension/hyperlipidemia ?P: ?Cardiology following as above ?Continuous telemetry  ?Trend  BNP ?Heart health diet with sodium restriction when able  ?Strict intake and output  ?Volume removal per HD ?Optimize electrolytes ?Repeat echocardiogram pending ?Continue aspirin and Plavix ?Continue statin ?Hold home antihypertensives given  hypotension in the setting of bradycardia ? ?Restrictive lung disease secondary to asbestos exposure ?Pleural plaques ?-CT shows extensive calcified pleural plaques ?-Most of the imaging is completed through [facility] but patient does follow with Dr. Vaughan Browner in her pulmonary clinic ?History of sleep apnea ?-Intolerant of CPAP ?History of COPD ?P: ?Encourage mobilization when able ?Continue bronchodilators ?Encourage pulmonary hygiene ?Outpatient pulmonary follow-up ? ?Electrolyte abnormalities: ?Hyperkalemia ?Hypomagnesemia ?P: ?Initiate HD as above ?Trend to be met ? ?Best Practice (right click and "Reselect all SmartList Selections" daily)  ? ?Diet/type: Regular consistency (see orders) ?DVT prophylaxis: systemic heparin ?GI prophylaxis: PPI ?Lines: N/A ?Foley:  N/A ?Code Status:  full code ?Last date of multidisciplinary goals of care discussion: Please see separate plan of care note dated 05/13/2021 ? ?Labs   ?CBC: ?Recent Labs  ?Lab 05/13/21 ?4268 05/13/21 ?1040 05/13/21 ?1042  ?WBC 5.0  --   --   ?NEUTROABS 4.3  --   --   ?HGB 10.1* 10.5* 11.2*  ?HCT 29.9* 31.0* 33.0*  ?MCV 93.7  --   --   ?PLT 72*  --   --   ? ? ?Basic Metabolic Panel: ?Recent Labs  ?Lab 05/13/21 ?3419 05/13/21 ?[place]  05/13/21 ?1040 05/13/21 ?1042  ?NA 139  --  134* 136  ?K 5.2*  --  4.9 5.4*  ?CL 104  --   --  105  ?CO2 14*  --   --   --   ?GLUCOSE 134*  --   --  124*  ?BUN 174*  --   --  >130*  ?CREATININE 8.91*  --   --  10.10*  ?CALCIUM 8.1*  --   --   --   ?MG  --  3.8*  --   --   ? ?GFR: ?Estimated Creatinine Clearance: 7.2 mL/min (A) (by C-G formula based on SCr of 10.1 mg/dL (H)). ?Recent Labs  ?Lab 05/13/21 ?0943  ?WBC 5.0  ? ? ?Liver Function Tests: ?Recent Labs  ?Lab 05/13/21 ?0943  ?AST 25  ?ALT 14  ?ALKPHOS 76  ?BILITOT 0.7  ?PROT 5.9*  ?ALBUMIN 2.9*  ? ?No results for input(s): LIPASE, AMYLASE in the last 168 hours. ?No results for input(s): AMMONIA in the last 168 hours. ? ?ABG ?   ?Component Value Date/Time  ? HCO3 14.2 (L)  05/13/2021 1040  ? TCO2 18 (L) 05/13/2021 1042  ? ACIDBASEDEF 10.0 (H) 05/13/2021 1040  ? O2SAT 100 05/13/2021 1040  ?  ? ?Coagulation Profile: ?No results for input(s): INR, PROTIME in the last 168 hours. ? ?Ca

## 2021-05-13 NOTE — Consult Note (Addendum)
?Cardiology Consultation:  ? ?Patient ID: Peter Oneill ?MRN: [id number]; DOB: 1940-05-26 ? ?Admit date: 05/13/2021 ?Date of Consult: 05/13/2021 ? ?PCP:  [name], MD ?  ?[facility] Prov[facility] ?Cardiologist:  Larae Grooms, MD  ? ?Patient Profile:  ? ?Peter Oneill is a 81 y.o. male with a history of extensive CAD s/p CABG in 2003 with subsequent PCI in 2004, repeat CABG x2 (RIMA to LAD and SVG to Diag) in 01/2015, and most recently PCI in 11/2015 in [place], [place]. Also has a history of chronic diastolic CHF, bilateral carotid artery disease s/p prior CEA, asbestosis, hypertension, hyperlipidemia, ESRD on, and GERD who is being seen 05/13/2021 for the evaluation of bradycardia at the request of Dr. Dina Rich. ? ?History of Present Illness:  ? ?Peter Oneill is a 81 year old male with the above history who is followed by Dr. Irish Lack.  Patient has a history of extensive CAD s/p multiple interventions. He had stents placed in 2003 and then ultimately required a CABG that same year  He had subsequent stenting in 2004 and then required repeat CABG in 01/2015. Most recently, he had PCI with stent placement on 11/2015 in [place], [place].  Patient was admitted in 03/2020 with severe volume overload and 30 lb weight gain in the setting of acute renal failure and acute diastolic CHF. Echo at that time showed LVEF of 50-55% with anterior septal hypokinesis mild LVH as well as mildly enlarged RV with mildly reduced systolic function.  He was aggressively diuresed with follow-up with Cardiology and Nephrology during admission.  Patient was last seen by Dr. Emeterio Reeve in 10/2020 at which time he was stable from a cardiac standpoint.   ? ?He has had progression of his CKD since that time.  He has a fistula in his left arm but this is still maturing and a tunneled catheter in his right IJ.  He was scheduled to start dialysis today but was sent to the ED from the from the dialysis center due to degree of shortness of breath  and volume overload.  Upon arrival to the ED,  Patient noted to be bradycardic with rates in the 20s to 30s.  EKG shows an irregular rhythm with a rate of 32 bpm.  It is difficult to interpret the underlying rhythm as P waves are difficult to see.  Possible atrial fibrillation vs possible high-grade AV block vs junctional rhythm. Initial high-sensitivity troponin 44. BNP elevated at 1,173. Chest x-ray showed patchy infiltrates in the right mid and both lower lung field suggesting atelectasis/pneumonia as well as small bilateral pleural effusions (left > right). WBC 5.0, Hgb 10.1, Plts 72. Na 139, K 5.2, Glucose 134, BUN 174, Cr 8.91. Albumin 2.9, AST 25, ALT 14, Alk Phos 76, Total Bili 0.7. Anion gap 21. Mag 3.8. Given significant bradycardia and hypotension, patient was given 0.8m of Atropine with improvement in BP and improvement in heart rates to the 40s. Cardiology consulted for further evaluation. ? ?At the time of this patient, patient is resting in no acute distress.  Heart rates in the 40s and BP has stabilized.  He reports worsening shortness of breath and abdominal distention over the last 3 to 4 weeks.  He has chronic dyspnea and due to his history of asbestosis but notes worsening shortness of breath both at rest and with exertion.  He has chronic orthopnea and reports labored breathing at night.  He also reports worsening abdominal distention and a 40 lb weight gain over the past 3 to 4  weeks.  He also has significant lower extremity edema on exam.  He denies any chest pain, palpitations, lightheadedness, dizziness, syncope.  He has a chronic cough but denies any recent fevers or illnesses.  He has black stools which he attributes to being on iron but no other abnormal bleeding. ? ?Past Medical History:  ?Diagnosis Date  ? Asbestosis ([place])   ? Contraindication to percutaneous coronary intervention (PCI)   ? Coronary artery disease   ? Dyslipidemia   ? Dyspnea   ? History of anemia   ?  Hypercholesterolemia   ? Hypertension   ? Ileus ([place])   ? Pleural effusion on left   ? Pneumonia   ? Renal failure   ? Sleep apnea   ? ? ?Past Surgical History:  ?Procedure Laterality Date  ? AV FISTULA PLACEMENT Left 04/23/2021  ? Procedure: LEFT ARM  FISTULA CREATION;  Surgeon: Broadus John, MD;  Location: [facility] OR;  Service: Vascular;  Laterality: Left;  PERIPHERAL NERVE BLOCK  ? BACK SURGERY    ? CARDIAC CATHETERIZATION    ? CAROTID ENDARTERECTOMY    ? CARPAL TUNNEL RELEASE    ? CORONARY ARTERY BYPASS GRAFT    ? HERNIA REPAIR    ? PERCUTANEOUS CORONARY STENT INTERVENTION (PCI-S)    ? multiple  ? STERNOTOMY    ?  ? ?Home Medications:  ?Prior to Admission medications   ?Medication Sig Start Date End Date Taking? Authorizing Provider  ?allopurinol (ZYLOPRIM) 300 MG tablet Take 150 mg by mouth in the morning. 04/05/21   [provider]  ?amLODipine (NORVASC) 10 MG tablet TAKE 1 TABLET BY MOUTH  DAILY 12/12/20   [name], MD  ?[facility] ELLIPTA 62.5-25 MCG/ACT AEPB Inhale 1 puff into the lungs daily. USE 1 INHALATION ONCE DAILY 12/25/20   Marshell Garfinkel, MD  ?aspirin EC 81 MG tablet Take 81 mg by mouth in the morning.    [provider]  ?carvedilol (COREG) 3.125 MG tablet TAKE 1 TABLET BY MOUTH  TWICE DAILY 12/17/20   [name], MD  ?Cholecalciferol (VITAMIN D3 PO) Take 1 tablet by mouth in the morning.    [provider]  ?clopidogrel (PLAVIX) 75 MG tablet Take 75 mg by mouth daily.    [provider]  ?ezetimibe (ZETIA) 10 MG tablet Take 10 mg by mouth in the morning.    [provider]  ?furosemide (LASIX) 80 MG tablet Take 1 tablet (80 mg total) by mouth 2 (two) times daily. 04/17/20 04/20/23  [name], MD  ?HYDROcodone-acetaminophen (NORCO) 5-325 MG tablet Take 1 tablet by mouth every 6 (six) hours as needed for moderate pain. 04/23/21   [name], PA-C  ?hydrocortisone (ANUSOL-HC) 25 MG suppository Place 25 mg rectally daily as needed for  hemorrhoids or anal itching.    [provider]  ?latanoprost (XALATAN) 0.005 % ophthalmic solution Place 1 drop into both eyes at bedtime.    [provider]  ?Multiple Vitamin (MULTIVITAMIN) tablet Take 1 tablet by mouth in the morning.    [provider]  ?nitroGLYCERIN (NITROSTAT) 0.4 MG SL tablet Place 1 tablet (0.4 mg total) under the tongue every 5 (five) minutes as needed for chest pain. 03/17/16   [name], MD  ?Omega-3 Fatty Acids (FISH OIL PO) Take 2,000 mg by mouth 3 (three) times a week.    [provider]  ?rosuvastatin (CRESTOR) 10 MG tablet Take 10 mg by mouth in the morning.    [provider]  ?silodosin (RAPAFLO) 4 MG CAPS capsule Take 4 mg by mouth daily with breakfast.    [provider]  ? ? ?Inpatient Medications: ?Scheduled Meds: ? Chlorhexidine Gluconate Cloth  6 each Topical Q0600  ? heparin  5,000 Units Subcutaneous Q8H  ? pantoprazole  40 mg Oral Daily  ? ?Continuous Infusions: ? DOPamine 5 mcg/kg/min (05/13/21 1220)  ? ?PRN Meds: ? ? ?Allergies:    ?Allergies  ?Allergen Reactions  ? Atorvastatin Other (See Comments)  ?  Muscle aches  ? ? ?Social History:   ?Social History  ? ?Socioeconomic History  ? Marital status: Married  ?  Spouse name: Not on file  ? Number of children: Not on file  ? Years of education: Not on file  ? Highest education level: Not on file  ?Occupational History  ? Not on file  ?Tobacco Use  ? Smoking status: Former  ?  Packs/day: 0.25  ?  Years: 19.00  ?  Pack years: 4.75  ?  Types: Cigarettes  ?  Quit date: 12/10/1976  ?  Years since quitting: 44.4  ? Smokeless tobacco: Never  ?Vaping Use  ? Vaping Use: Never used  ?Substance and Sexual Activity  ? Alcohol use: Yes  ?  Comment: occasional  ? Drug use: No  ? Sexual activity: Yes  ?  Comment: married  ?Other Topics Concern  ? Not on file  ?Social History Narrative  ? Not on file  ? ?Social Determinants of Health  ? ?Financial Resource Strain: Not on  file  ?Food Insecurity: Not on file  ?Transportation Needs: Not on file  ?Physical Activity: Not on file  ?Stress: Not on file  ?Social Connections: Not on file  ?Intimate Partner Violence: Not on file  ?  ?Family Histor

## 2021-05-13 NOTE — ED Notes (Signed)
Placed  on pacing pads with monitor Dr. Dina Rich at bedside.  ?

## 2021-05-13 NOTE — ED Provider Notes (Signed)
?[place] ?Provider Note ? ? ?CSN: [id number] ?Arrival date & time: 05/13/21  4696 ? ?  ? ?History ? ?Chief Complaint  ?Patient presents with  ? Needs Dialysis  ? ? ?Peter Oneill is a 81 y.o. male. ? ?HPI ? ?81 year old male with past medical history of CAD status post stenting, CABG x2, COPD, CKD with a left maturing AV fistula and tunneled dialysis catheter (catheter was placed this morning for more emergent dialysis due to fluid overloaded status despite oral diuresis as an outpatient) presents to the emergency department with concern for fluid retention, shortness of breath.  Over the past couple days to weeks the patient has experienced about a 40 pound weight gain.  This is despite increasing his Lasix twice daily dose of which he has been compliant with.  He has a productive cough of white phlegm.  No noted fever, vomiting or diarrhea.  Follows with cardiology and nephrology as an outpatient.  Sent here for more emergent dialysis. [name], FULL code. ? ?Home Medications ?Prior to Admission medications   ?Medication Sig Start Date End Date Taking? Authorizing Provider  ?allopurinol (ZYLOPRIM) 300 MG tablet Take 150 mg by mouth in the morning. 04/05/21   [provider]  ?amLODipine (NORVASC) 10 MG tablet TAKE 1 TABLET BY MOUTH  DAILY 12/12/20   [name], MD  ?[facility] ELLIPTA 62.5-25 MCG/ACT AEPB Inhale 1 puff into the lungs daily. USE 1 INHALATION ONCE DAILY 12/25/20   Marshell Garfinkel, MD  ?aspirin EC 81 MG tablet Take 81 mg by mouth in the morning.    [provider]  ?carvedilol (COREG) 3.125 MG tablet TAKE 1 TABLET BY MOUTH  TWICE DAILY 12/17/20   [name], MD  ?Cholecalciferol (VITAMIN D3 PO) Take 1 tablet by mouth in the morning.    [provider]  ?clopidogrel (PLAVIX) 75 MG tablet Take 75 mg by mouth daily.    [provider]  ?ezetimibe (ZETIA) 10 MG tablet Take 10 mg by mouth in the morning.     [provider]  ?furosemide (LASIX) 80 MG tablet Take 1 tablet (80 mg total) by mouth 2 (two) times daily. 04/17/20 04/20/23  [name], MD  ?HYDROcodone-acetaminophen (NORCO) 5-325 MG tablet Take 1 tablet by mouth every 6 (six) hours as needed for moderate pain. 04/23/21   [name], PA-C  ?hydrocortisone (ANUSOL-HC) 25 MG suppository Place 25 mg rectally daily as needed for hemorrhoids or anal itching.    [provider]  ?latanoprost (XALATAN) 0.005 % ophthalmic solution Place 1 drop into both eyes at bedtime.    [provider]  ?Multiple Vitamin (MULTIVITAMIN) tablet Take 1 tablet by mouth in the morning.    [provider]  ?nitroGLYCERIN (NITROSTAT) 0.4 MG SL tablet Place 1 tablet (0.4 mg total) under the tongue every 5 (five) minutes as needed for chest pain. 03/17/16   [name], MD  ?Omega-3 Fatty Acids (FISH OIL PO) Take 2,000 mg by mouth 3 (three) times a week.    [provider]  ?rosuvastatin (CRESTOR) 10 MG tablet Take 10 mg by mouth in the morning.    [provider]  ?silodosin (RAPAFLO) 4 MG CAPS capsule Take 4 mg by mouth daily with breakfast.    [provider]  ?   ? ?Allergies    ?Atorvastatin   ? ?Review of Systems   ?Review of Systems  ?Constitutional:  Positive for fatigue. Negative for fever.  ?  Respiratory:  Positive for cough, chest tightness and shortness of breath.   ?Cardiovascular:  Positive for palpitations and leg swelling. Negative for chest pain.  ?Gastrointestinal:  Positive for abdominal distention. Negative for abdominal pain, diarrhea and vomiting.  ?Skin:  Negative for rash.  ?Neurological:  Negative for headaches.  ? ?Physical Exam ?Updated Vital Signs ?BP (!) 122/50   Pulse (!) 156   Temp 98.5 ?F (36.9 ?C)   Resp 15   Wt 108.9 kg Comment: Reported by wife  SpO2 97%   BMI 35.44 kg/m?  ?Physical Exam ?Vitals and nursing note reviewed.  ?Constitutional:   ?   Appearance: Normal appearance.  He is ill-appearing. He is not diaphoretic.  ?HENT:  ?   Head: Normocephalic.  ?   Mouth/Throat:  ?   Mouth: Mucous membranes are moist.  ?Cardiovascular:  ?   Rate and Rhythm: Normal rate.  ?Pulmonary:  ?   Breath sounds: Rhonchi and rales present.  ?   Comments: Intermittent tachypnea, on RA ?Abdominal:  ?   General: There is distension.  ?   Palpations: Abdomen is soft.  ?   Tenderness: There is no abdominal tenderness.  ?Musculoskeletal:  ?   Right lower leg: Edema present.  ?   Left lower leg: Edema present.  ?Skin: ?   General: Skin is warm.  ?Neurological:  ?   Mental Status: He is alert and oriented to person, place, and time. Mental status is at baseline.  ? ? ?ED Results / Procedures / Treatments   ?Labs ?(all labs ordered are listed, but only abnormal results are displayed) ?Labs Reviewed  ?COMPREHENSIVE METABOLIC PANEL - Abnormal; Notable for the following components:  ?    Result Value  ? Potassium 5.2 (*)   ? CO2 14 (*)   ? Glucose, Bld 134 (*)   ? BUN 174 (*)   ? Creatinine, Ser 8.91 (*)   ? Calcium 8.1 (*)   ? Total Protein 5.9 (*)   ? Albumin 2.9 (*)   ? GFR, Estimated 6 (*)   ? Anion gap 21 (*)   ? All other components within normal limits  ?CBC WITH DIFFERENTIAL/PLATELET - Abnormal; Notable for the following components:  ? RBC 3.19 (*)   ? Hemoglobin 10.1 (*)   ? HCT 29.9 (*)   ? RDW 17.1 (*)   ? Platelets 72 (*)   ? nRBC 2.4 (*)   ? Lymphs Abs 0.4 (*)   ? All other components within normal limits  ?MAGNESIUM - Abnormal; Notable for the following components:  ? Magnesium 3.8 (*)   ? All other components within normal limits  ?BRAIN NATRIURETIC PEPTIDE - Abnormal; Notable for the following components:  ? B Natriuretic Peptide 1,173.0 (*)   ? All other components within normal limits  ?I-STAT CHEM 8, ED - Abnormal; Notable for the following components:  ? Potassium 5.4 (*)   ? BUN >130 (*)   ? Creatinine, Ser 10.10 (*)   ? Glucose, Bld 124 (*)   ? Calcium, Ion 0.97 (*)   ? TCO2 18 (*)   ? Hemoglobin  11.2 (*)   ? HCT 33.0 (*)   ? All other components within normal limits  ?I-STAT VENOUS BLOOD GAS, ED - Abnormal; Notable for the following components:  ? pCO2, Ven 26.6 (*)   ? pO2, Ven 201 (*)   ? Bicarbonate 14.2 (*)   ? TCO2 15 (*)   ? Acid-base deficit 10.0 (*)   ?  Sodium 134 (*)   ? Calcium, Ion 0.84 (*)   ? HCT 31.0 (*)   ? Hemoglobin 10.5 (*)   ? All other components within normal limits  ?TROPONIN I (HIGH SENSITIVITY) - Abnormal; Notable for the following components:  ? Troponin I (High Sensitivity) 44 (*)   ? All other components within normal limits  ?RESP PANEL BY RT-PCR (FLU A&B, COVID) ARPGX2  ?CBC  ?CREATININE, SERUM  ?RENAL FUNCTION PANEL  ?HEPATITIS B SURFACE ANTIGEN  ?HEPATITIS B SURFACE ANTIBODY,QUALITATIVE  ?HEPATITIS B SURFACE ANTIBODY, QUANTITATIVE  ?TROPONIN I (HIGH SENSITIVITY)  ? ? ?EKG ?EKG Interpretation ? ?Date/Time:  Monday May 13 2021 11:17:35 EDT ?Ventricular Rate:  32 ?PR Interval:    ?QRS Duration: 86 ?QT Interval:  676 ?QTC Calculation: 493 ?R Axis:   -6 ?Text Interpretation: Atrial fibrillation with slow ventricular response Low voltage QRS Possible Inferior infarct , age undetermined Cannot rule out Anterior infarct , age undetermined Abnormal ECG When compared with ECG of 23-Apr-2021 09:30, PREVIOUS ECG IS PRESENT Possible block, no p waves Confirmed by Lavenia Atlas [phone]) on 05/13/2021 10:03:01 AM ? ?Radiology ?DG Chest 2 View ? ?Result Date: 05/13/2021 ?CLINICAL DATA:  Shortness of breath EXAM: CHEST - 2 VIEW COMPARISON:  Previous studies including the examination of 04/10/2020 FINDINGS: Transverse diameter of heart is increased. Central pulmonary vessels are prominent. Are scattered pleural calcifications. There are patchy infiltrates in right parahilar region and both lower lung fields. There is blunting CP lateral CP angles. There is no pneumothorax. Tip right IJ dialysis catheter is seen in the superior vena cava. Metallic sutures seen in the sternum. IMPRESSION: There  are patchy infiltrates in the right mid and both lower lung fields suggesting atelectasis/pneumonia. Small bilateral pleural effusions, more so on the left side. There scattered linear densities and pleura

## 2021-05-13 NOTE — ED Notes (Signed)
EDP aware of temp.89.9 rectal ?

## 2021-05-13 NOTE — ED Notes (Signed)
Patient c/o sob x 3 weeks with 40 lb weight gain ?

## 2021-05-13 NOTE — Consult Note (Signed)
Nephrology Consult  ? ?Assessment/Recommendations:  ? ?CKD5, now ESRD ?-follows w/ Dr. Justin Mend as an outpatient ?-plan to initiate HD today (bedside in ICU) w/ dopamine support in the context of bradycardia. Slow start protocol. Discussed with patient and wife at bedside and they are very agreeable to getting started. Will plan for HD#2 tomorrow as well ?-CLIP ?-Continue to monitor daily Cr, Dose meds for GFR<15 ?-Maintain MAP>65 for optimal renal perfusion.  ?-Avoid nephrotoxic medications including NSAIDs and iodinated intravenous contrast exposure unless the latter is absolutely indicated.  Preferred narcotic agents for pain control are hydromorphone, fentanyl, and methadone. Morphine should not be used. Avoid Baclofen and avoid oral sodium phosphate and magnesium citrate based laxatives / bowel preps. Continue strict Input and Output monitoring. Will monitor the patient closely with you and intervene or adjust therapy as indicated by changes in clinical status/labs  ? ?Hypervolemia, acute on chronic dCHF exacerbation, ESRD ?-failed outpatient diuresis ?-starting HD as above, will UF as tolerated ?-daily weights ? ?Bradycardia ?-appreciate recommendations from cardiology ?-dopamine gtt to be started ? ?Hyperkalemia ?-mild, starting HD as above ? ?AGMA 2/2 ESRD ?-HD ? ?Anemia due to chronic kidney disease: ?-Transfuse for Hgb<7 g/dL ?-Hgb 11.2 currently ?-will check an iron panel ? ?Dialysis access ?-s/p Left BC AVF placed 04/23/21--maturing ?-s/p RIJ [facility] 05/13/21 ([facility]) ? ?Recommendations conveyed to primary service.  ? ? ?Angelo Prindle Candiss Norse ?[facility] ?05/13/2021 ?11:51 AM ? ? ?_____________________________________________________________________________________ ? ? ?History of Present Illness: Peter Oneill is a/an 81 y.o. male with a past medical history of CKD5, HTN, OSA, asbestosis, CAD s/p CABG who presents to [facility] with worsening edema, SOB, volume overload. Is s/p RIJ TDC placement today with Dr. Augustin Coupe  as an outpatient at [facility] but was sent directly here after catheter placement for dyspnea. Had been on lasix 120mg  BID w/ metolazone 10mg  daily as an outpatient. Was found to be bradycardic here with HR in the 20's, responded to atropine, now in the 40's. He reports that he is very ready to start HD. Discussed with wife at the bedside. ? ? ?Medications:  ?No current facility-administered medications for this encounter.  ? ?Current Outpatient Medications  ?Medication Sig Dispense Refill  ? allopurinol (ZYLOPRIM) 300 MG tablet Take 150 mg by mouth in the morning.    ? amLODipine (NORVASC) 10 MG tablet TAKE 1 TABLET BY MOUTH  DAILY 90 tablet 3  ? [facility] ELLIPTA 62.5-25 MCG/ACT AEPB Inhale 1 puff into the lungs daily. USE 1 INHALATION ONCE DAILY 60 each 5  ? aspirin EC 81 MG tablet Take 81 mg by mouth in the morning.    ? carvedilol (COREG) 3.125 MG tablet TAKE 1 TABLET BY MOUTH  TWICE DAILY 180 tablet 3  ? Cholecalciferol (VITAMIN D3 PO) Take 1 tablet by mouth in the morning.    ? clopidogrel (PLAVIX) 75 MG tablet Take 75 mg by mouth daily.    ? ezetimibe (ZETIA) 10 MG tablet Take 10 mg by mouth in the morning.    ? furosemide (LASIX) 80 MG tablet Take 1 tablet (80 mg total) by mouth 2 (two) times daily. 60 tablet 0  ? HYDROcodone-acetaminophen (NORCO) 5-325 MG tablet Take 1 tablet by mouth every 6 (six) hours as needed for moderate pain. 10 tablet 0  ? hydrocortisone (ANUSOL-HC) 25 MG suppository Place 25 mg rectally daily as needed for hemorrhoids or anal itching.    ? latanoprost (XALATAN) 0.005 % ophthalmic solution Place 1 drop into both eyes at bedtime.    ?  Multiple Vitamin (MULTIVITAMIN) tablet Take 1 tablet by mouth in the morning.    ? nitroGLYCERIN (NITROSTAT) 0.4 MG SL tablet Place 1 tablet (0.4 mg total) under the tongue every 5 (five) minutes as needed for chest pain. 25 tablet 3  ? Omega-3 Fatty Acids (FISH OIL PO) Take 2,000 mg by mouth 3 (three) times a week.    ? rosuvastatin (CRESTOR) 10 MG tablet Take  10 mg by mouth in the morning.    ? silodosin (RAPAFLO) 4 MG CAPS capsule Take 4 mg by mouth daily with breakfast.    ?  ? ?ALLERGIES ?Atorvastatin ? ?MEDICAL HISTORY ?Past Medical History:  ?Diagnosis Date  ? Asbestosis ([place])   ? Contraindication to percutaneous coronary intervention (PCI)   ? Coronary artery disease   ? Dyslipidemia   ? Dyspnea   ? History of anemia   ? Hypercholesterolemia   ? Hypertension   ? Ileus ([place])   ? Pleural effusion on left   ? Pneumonia   ? Renal failure   ? Sleep apnea   ?  ? ?SOCIAL HISTORY ?Social History  ? ?Socioeconomic History  ? Marital status: Married  ?  Spouse name: Not on file  ? Number of children: Not on file  ? Years of education: Not on file  ? Highest education level: Not on file  ?Occupational History  ? Not on file  ?Tobacco Use  ? Smoking status: Former  ?  Packs/day: 0.25  ?  Years: 19.00  ?  Pack years: 4.75  ?  Types: Cigarettes  ?  Quit date: 12/10/1976  ?  Years since quitting: 44.4  ? Smokeless tobacco: Never  ?Vaping Use  ? Vaping Use: Never used  ?Substance and Sexual Activity  ? Alcohol use: Yes  ?  Comment: occasional  ? Drug use: No  ? Sexual activity: Yes  ?  Comment: married  ?Other Topics Concern  ? Not on file  ?Social History Narrative  ? Not on file  ? ?Social Determinants of Health  ? ?Financial Resource Strain: Not on file  ?Food Insecurity: Not on file  ?Transportation Needs: Not on file  ?Physical Activity: Not on file  ?Stress: Not on file  ?Social Connections: Not on file  ?Intimate Partner Violence: Not on file  ?  ? ?FAMILY HISTORY ?Family History  ?Problem Relation Age of Onset  ? Heart disease Father   ?  ? ?Review of Systems: ?12 systems reviewed ?Otherwise as per HPI, all other systems reviewed and negative ? ?Physical Exam: ?Vitals:  ? 05/13/21 1110 05/13/21 1130  ?BP: (!) 105/58 (!) 122/50  ?Pulse: (!) 34 (!) 156  ?Resp: 13 15  ?Temp:    ?SpO2: 98% 97%  ? ?Total I/O ?In: 42.7 [IV Piggyback:42.7] ?Out: -  ? ?Intake/Output Summary (Last  24 hours) at 05/13/2021 1151 ?Last data filed at 05/13/2021 1135 ?Gross per 24 hour  ?Intake 42.65 ml  ?Output --  ?Net 42.65 ml  ? ?General: well-appearing, in mild distress, sitting up in bed ?HEENT: anicteric sclera, oropharynx clear without lesions ?CV: bradycardic, reg rhythm ?Lungs: diminished air entry bibasilar, inc'd WOB ?Abd: soft, non-tender, distended ?Skin: no visible lesions or rashes ?Psych: alert, engaged, appropriate mood and affect ?Musculoskeletal: 3+ pitting edema b/l LEs ?Neuro: normal speech, no gross focal deficits  ?Dialysis access: RIJ TDC, LUE AVF +thrill (maturing) ? ?Test Results ?Reviewed ?Lab Results  ?Component Value Date  ? NA 136 05/13/2021  ? K 5.4 (H) 05/13/2021  ?  CL 105 05/13/2021  ? CO2 14 (L) 05/13/2021  ? BUN >130 (H) 05/13/2021  ? CREATININE 10.10 (H) 05/13/2021  ? CALCIUM 8.1 (L) 05/13/2021  ? ALBUMIN 2.9 (L) 05/13/2021  ? PHOS 5.1 (H) 04/17/2020  ? ? ? ?I have reviewed all relevant outside healthcare records related to the patient's kidney injury.  ? ?

## 2021-05-13 NOTE — ED Provider Triage Note (Signed)
Emergency Medicine Provider Triage Evaluation Note ? ?Peter Oneill , a 81 y.o. male  was evaluated in triage.  Pt complains of shortness of breath, fluid overload and need for dialysis.  Patient had fistula placed in left arm on 04/23/2021.  He was seen at vascular center today and was sent to the ER for needing dialysis.  He has not yet received dialysis.  He denies any chest pain but does report pain in the area of his kidneys.  States that he is having trouble sleeping due to feeling short of breath from his fluid overload.  Has gained "a lot of weight" in the past few weeks.  Denies any fever, vomiting. ? ?Review of Systems  ?Positive: Shortness of breath, peripheral edema ?Negative: Fever, vomit ? ?Physical Exam  ?BP (!) 163/126   Pulse (!) 120   Temp 98.5 ?F (36.9 ?C)   Resp (!) 21   SpO2 90%  ?Gen:   Awake, no distress   ?Resp:  Normal effort  ?MSK:   Moves extremities without difficulty  ?Other:  Pitting edema noted to bilateral lower extremities.  Abdominal distention ? ?Medical Decision Making  ?Medically screening exam initiated at 9:39 AM.  Appropriate orders placed.  Celvin Taney was informed that the remainder of the evaluation will be completed by another provider, this initial triage assessment does not replace that evaluation, and the importance of remaining in the ED until their evaluation is complete. ? ?Will room soon as patient is tachycardic, hypertensive ?  ?Delia Heady, PA-C ?05/13/21 1583 ? ?

## 2021-05-14 DIAGNOSIS — Z992 Dependence on renal dialysis: Secondary | ICD-10-CM | POA: Diagnosis not present

## 2021-05-14 DIAGNOSIS — R001 Bradycardia, unspecified: Secondary | ICD-10-CM | POA: Diagnosis not present

## 2021-05-14 DIAGNOSIS — I459 Conduction disorder, unspecified: Secondary | ICD-10-CM | POA: Diagnosis not present

## 2021-05-14 DIAGNOSIS — I5043 Acute on chronic combined systolic (congestive) and diastolic (congestive) heart failure: Secondary | ICD-10-CM

## 2021-05-14 DIAGNOSIS — N186 End stage renal disease: Secondary | ICD-10-CM | POA: Diagnosis not present

## 2021-05-14 LAB — BASIC METABOLIC PANEL
Anion gap: 19 — ABNORMAL HIGH (ref 5–15)
BUN: 142 mg/dL — ABNORMAL HIGH (ref 8–23)
CO2: 17 mmol/L — ABNORMAL LOW (ref 22–32)
Calcium: 8.5 mg/dL — ABNORMAL LOW (ref 8.9–10.3)
Chloride: 100 mmol/L (ref 98–111)
Creatinine, Ser: 8.09 mg/dL — ABNORMAL HIGH (ref 0.61–1.24)
GFR, Estimated: 6 mL/min — ABNORMAL LOW (ref >60)
Glucose, Bld: 113 mg/dL — ABNORMAL HIGH (ref 70–99)
Potassium: 4.6 mmol/L (ref 3.5–5.1)
Sodium: 136 mmol/L (ref 135–145)

## 2021-05-14 LAB — CBC
HCT: 32.5 % — ABNORMAL LOW (ref 39.0–52.0)
Hemoglobin: 11 g/dL — ABNORMAL LOW (ref 13.0–17.0)
MCH: 30.7 pg (ref 26.0–34.0)
MCHC: 33.8 g/dL (ref 30.0–36.0)
MCV: 90.8 fL (ref 80.0–100.0)
Platelets: 72 10*3/uL — ABNORMAL LOW (ref 150–400)
RBC: 3.58 MIL/uL — ABNORMAL LOW (ref 4.22–5.81)
RDW: 16.3 % — ABNORMAL HIGH (ref 11.5–15.5)
WBC: 8 10*3/uL (ref 4.0–10.5)
nRBC: 3.4 % — ABNORMAL HIGH (ref 0.0–0.2)

## 2021-05-14 LAB — IRON AND TIBC
Iron: 44 ug/dL — ABNORMAL LOW (ref 45–182)
Saturation Ratios: 13 % — ABNORMAL LOW (ref 17.9–39.5)
TIBC: 333 ug/dL (ref 250–450)
UIBC: 289 ug/dL

## 2021-05-14 LAB — MAGNESIUM: Magnesium: 3.6 mg/dL — ABNORMAL HIGH (ref 1.7–2.4)

## 2021-05-14 LAB — HEPATITIS B SURFACE ANTIBODY, QUANTITATIVE: Hep B S AB Quant (Post): 69.2 m[IU]/mL (ref >9.9)

## 2021-05-14 LAB — T4, FREE: Free T4: 0.69 ng/dL (ref 0.61–1.12)

## 2021-05-14 LAB — TSH: TSH: 7.5 u[IU]/mL — ABNORMAL HIGH (ref 0.350–4.500)

## 2021-05-14 LAB — PHOSPHORUS: Phosphorus: 11.4 mg/dL — ABNORMAL HIGH (ref 2.5–4.6)

## 2021-05-14 LAB — FERRITIN: Ferritin: 118 ng/mL (ref 24–336)

## 2021-05-14 MED ORDER — ACETAMINOPHEN 325 MG PO TABS
650.0000 mg | ORAL_TABLET | Freq: Four times a day (QID) | ORAL | Status: DC | PRN
  Administered 2021-05-14: 650 mg via ORAL
  Filled 2021-05-14: qty 2

## 2021-05-14 MED ORDER — SEVELAMER CARBONATE 800 MG PO TABS
1600.0000 mg | ORAL_TABLET | Freq: Three times a day (TID) | ORAL | Status: DC
Start: 2021-05-14 — End: 2021-05-19
  Administered 2021-05-14 – 2021-05-19 (×12): 1600 mg via ORAL
  Filled 2021-05-14 (×12): qty 2

## 2021-05-14 MED ORDER — ASPIRIN EC 81 MG PO TBEC
81.0000 mg | DELAYED_RELEASE_TABLET | Freq: Every day | ORAL | Status: DC
  Administered 2021-05-14 – 2021-05-19 (×6): 81 mg via ORAL
  Filled 2021-05-14 (×6): qty 1

## 2021-05-14 MED ORDER — UMECLIDINIUM-VILANTEROL 62.5-25 MCG/ACT IN AEPB
1.0000 | INHALATION_SPRAY | Freq: Every day | RESPIRATORY_TRACT | Status: DC
  Administered 2021-05-14 – 2021-05-19 (×6): 1 via RESPIRATORY_TRACT
  Filled 2021-05-14: qty 14

## 2021-05-14 MED ORDER — ROSUVASTATIN CALCIUM 5 MG PO TABS
10.0000 mg | ORAL_TABLET | Freq: Every day | ORAL | Status: DC
  Administered 2021-05-14 – 2021-05-19 (×6): 10 mg via ORAL
  Filled 2021-05-14 (×6): qty 2

## 2021-05-14 MED ORDER — CLOPIDOGREL BISULFATE 75 MG PO TABS
75.0000 mg | ORAL_TABLET | Freq: Every day | ORAL | Status: DC
  Administered 2021-05-14 – 2021-05-18 (×5): 75 mg via ORAL
  Filled 2021-05-14 (×5): qty 1

## 2021-05-14 MED ORDER — EZETIMIBE 10 MG PO TABS
10.0000 mg | ORAL_TABLET | Freq: Every day | ORAL | Status: DC
  Administered 2021-05-14 – 2021-05-19 (×6): 10 mg via ORAL
  Filled 2021-05-14 (×6): qty 1

## 2021-05-14 MED ORDER — SODIUM CHLORIDE 0.9 % IV SOLN
250.0000 mg | INTRAVENOUS | Status: DC
  Administered 2021-05-15 – 2021-05-17 (×2): 250 mg via INTRAVENOUS
  Filled 2021-05-14 (×2): qty 20

## 2021-05-14 MED ORDER — ACETAMINOPHEN 325 MG PO TABS
ORAL_TABLET | ORAL | Status: AC
  Administered 2021-05-14: 650 mg via ORAL
  Filled 2021-05-14: qty 2

## 2021-05-14 NOTE — Progress Notes (Signed)
TOC Case Manager acknowledges consult for arranging OP HD; appreciate Renal Navigator intervention and referral to [facility].  ? ?Reinaldo Raddle, RN, BSN  ?Trauma/Neuro ICU Case Manager ?[phone] ? ?

## 2021-05-14 NOTE — Progress Notes (Signed)
Requested to see pt for out-pt HD needs at d/c. Spoke to pt's wife via phone. Pt's wife prefers [facility] for pt's HD needs. Referral made to [facility] admissions today. Pt's wife plans to transport pt to HD appts at d/c. Will assist as needed.  ? ?Melven Sartorius ?Renal Navigator ?[phone] ?

## 2021-05-14 NOTE — Progress Notes (Signed)
? ?NAME:  Peter Oneill, MRN:  [id number], DOB:  December 08, 1940, LOS: 1 ?ADMISSION DATE:  05/13/2021, CONSULTATION DATE:  05/13/2021 ?REFERRING MD:  , CHIEF COMPLAINT:  Dyspnea, Bradycardia   ? ?History of Present Illness:  ?Peter Oneill is a/an 81 y.o. male with a past medical history of CKD5, HTN, OSA, asbestosis, CAD s/p CABG who presents to [facility] with worsening edema, SOB, volume overload. Is s/p RIJ TDC placement today with Dr. Augustin Coupe as an outpatient at [facility] but was sent directly here after catheter placement for dyspnea. Had been on lasix 142m BID w/ metolazone 137mdaily as an outpatient. Was found to be bradycardic here  in the ED on 3/20 with HR in the 20's, which responded to atropine, now in the 40's on Dopamine . ? Underlying heart Block vs QRS prolongation.  Plan is to initiate bedside HD today in the ICU  ? ?Pertinent  Medical History  ? ?Past Medical History:  ?Diagnosis Date  ? Asbestosis ([facility]  ? Contraindication to percutaneous coronary intervention (PCI)   ? Coronary artery disease   ? Dyslipidemia   ? Dyspnea   ? History of anemia   ? Hypercholesterolemia   ? Hypertension   ? [place]us [place]  ? Pleural effusion on left   ? Pneumonia   ? Renal failure   ? Sleep apnea   ? CABG 2003, coronary stenting 2004, repeat CABG 2016, repeat stenting 2017.  Also with COPD, CKD 4, bilateral carpal tunnel release. ? ?Significant Hospital Events: ?Including procedures, antibiotic start and stop dates in addition to other pertinent events   ?04/23/2021 left-sided brachiocephalic fistula creation ?[place] Cou[place].  ?[facility] and bradycardia into the 20's  requiring atropine and dopamine , ? Heart Block ( Cardiology consulted)  , volume overload.  ?First HD scheduled for today as bedside in the ICU ?Echo 03/2020 LVEF of 50-55% with anterior septal hypokinesis mild LVH as well as mildly enlarged RV with mildly reduced systolic function. ? ?Interim  History / Subjective:  ?On Hd this morning ?Still on dopa at 5 -- intermittently brady into 30s, otherwise 50s-60s  ? ?Objective   ?Blood pressure 125/67, pulse (!) 58, temperature (!) 96.3 ?F (35.7 ?C), resp. rate 16, height '5\' 9"'  (1.753 m), weight 98.9 kg, SpO2 95 %. ?   ?   ? ?Intake/Output Summary (Last 24 hours) at 05/14/2021 0918 ?Last data filed at 05/14/2021 0800 ?Gross per 24 hour  ?Intake 801.68 ml  ?Output 2000 ml  ?Net -1198.32 ml  ? ?Filed Weights  ? 05/13/21 1601 05/14/21 0500 05/14/21 0657  ?Weight: 108.9 kg 98.9 kg 98.9 kg  ? ? ?Examination: ?General: acutely ill appearing older adult M reclined in bed on Hd NAD ?HEENT: NCAT pink mm anicteric sclera ?Neuro: lethargic. Awake alert oriented x3 following commands ?CV: bradycardic s1s2 cap refill brisk  ?PULM:  even unlabored on RA, symmetrical chest expansion ?GI: soft ndnt + bowel sounds ?Extremities: Pitting edema  ?Skin: c/d/w. Sacral pressure wounds ? ? ?Resolved Hospital Problem list   ? ? ?Assessment & Plan:  ? ?Bradycardia ?-? wenkebach block vs complete heart block. Responded to atropine ?P: ?Dopamine at 5  ?Cards following  ? ?ESRD ?-Creatinine on admit 9.16, GFR 5 ?P: ?New HD initiation in ICU, augmented by dopa for bradycardia ? ?Acute on chronic diastolic HF ?Hx CAD ?HTN  ?HLD  ?-Patient presented with concerns of acute CHF given  progressive dyspnea, abdominal distention, lower extremity edema, and elevated BNP ?-Last echo 06/27/2020 revealed EF 50 to 55% with anterior septal hypokinesis and mild LVH ?P: ?Start ASA plavix statin ?Volume removal per HD  ? ?Restrictive lung disease due to asbestos exposure with related pleural plaques ?Hx OSA, intolerant of CPAP ?Hx COPD ?P: ?Start anoro  ?IS, mobility, pulm hygiene ? ?Electrolyte abnormalities: ?Hyperkalemia ?Hypomagnesemia ?P: ?Initiate HD as above ?Trend to be met ? ?Best Practice (right click and "Reselect all SmartList Selections" daily)  ? ?Diet/type: Regular consistency (see orders) ?DVT  prophylaxis: prophylactic heparin  ?GI prophylaxis: PPI ?Lines: Dialysis Catheter ?Foley:  N/A ?Code Status:  full code ?Last date of multidisciplinary goals of care discussion: Please see separate plan of care note dated 05/13/2021 ? ?Labs   ?CBC: ?Recent Labs  ?Lab 05/13/21 ?2993 05/13/21 ?1040 05/13/21 ?1042 05/13/21 ?1210 05/14/21 ?0119  ?WBC 5.0  --   --  4.6 8.0  ?NEUTROABS 4.3  --   --   --   --   ?HGB 10.1* 10.5* 11.2* 9.7* 11.0*  ?HCT 29.9* 31.0* 33.0* 29.3* 32.5*  ?MCV 93.7  --   --  94.2 90.8  ?PLT 72*  --   --  69* 72*  ? ? ?Basic Metabolic Panel: ?Recent Labs  ?Lab 05/13/21 ?7169 05/13/21 ?1020 05/13/21 ?1040 05/13/21 ?1042 05/13/21 ?1203 05/13/21 ?1210 05/14/21 ?0119  ?NA 139  --  134* 136 139  --  136  ?K 5.2*  --  4.9 5.4* 5.0  --  4.6  ?CL 104  --   --  105 104  --  100  ?CO2 14*  --   --   --  16*  --  17*  ?GLUCOSE 134*  --   --  124* 120*  --  113*  ?BUN 174*  --   --  >130* 173*  --  142*  ?CREATININE 8.91*  --   --  10.10* 9.16* 8.87* 8.09*  ?CALCIUM 8.1*  --   --   --  8.1*  --  8.5*  ?MG  --  3.8*  --   --   --   --  3.6*  ?PHOS  --   --   --   --  >30.0*  --  11.4*  ? ?GFR: ?Estimated Creatinine Clearance: 8.6 mL/min (A) (by C-G formula based on SCr of 8.09 mg/dL (H)). ?Recent Labs  ?Lab 05/13/21 ?6789 05/13/21 ?1210 05/14/21 ?0119  ?WBC 5.0 4.6 8.0  ? ? ?Liver Function Tests: ?Recent Labs  ?Lab 05/13/21 ?3810 05/13/21 ?1203  ?AST 25  --   ?ALT 14  --   ?ALKPHOS 76  --   ?BILITOT 0.7  --   ?PROT 5.9*  --   ?ALBUMIN 2.9* 2.9*  ? ?No results for input(s): LIPASE, AMYLASE in the last 168 hours. ?No results for input(s): AMMONIA in the last 168 hours. ? ?ABG ?   ?Component Value Date/Time  ? HCO3 14.2 (L) 05/13/2021 1040  ? TCO2 18 (L) 05/13/2021 1042  ? ACIDBASEDEF 10.0 (H) 05/13/2021 1040  ? O2SAT 100 05/13/2021 1040  ?  ? ?Coagulation Profile: ?No results for input(s): INR, PROTIME in the last 168 hours. ? ?Cardiac Enzymes: ?No results for input(s): CKTOTAL, CKMB, CKMBINDEX, TROPONINI in the  last 168 hours. ? ?HbA1C: ?No results found for: HGBA1C ? ?CBG: ?No results for input(s): GLUCAP in the last 168 hours. ? ?CRITICAL CARE ?Performed by: Cristal Generous ? ?Total critical care time: 40 minutes ? ?  Critical care time was exclusive of separately billable procedures and treating other patients. ?Critical care was necessary to treat or prevent imminent or life-threatening deterioration. ? ?Critical care was time spent personally by me on the following activities: development of treatment plan with patient and/or surrogate as well as nursing, discussions with consultants, evaluation of patient's response to treatment, examination of patient, obtaining history from patient or surrogate, ordering and performing treatments and interventions, ordering and review of laboratory studies, ordering and review of radiographic studies, pulse oximetry and re-evaluation of patient's condition. ? ?Eliseo Gum MSN, AGACNP-BC ?[facility] ?Amion for pager  ?05/14/2021, 9:18 AM ? ? ? ? ? ?  ?

## 2021-05-14 NOTE — Plan of Care (Signed)
?  Problem: Education: ?Goal: Knowledge of General Education information will improve ?Description: Including pain rating scale, medication(s)/side effects and non-pharmacologic comfort measures ?Outcome: Progressing ?  ?Problem: Clinical Measurements: ?Goal: Diagnostic test results will improve ?Outcome: Progressing ?Goal: Respiratory complications will improve ?Outcome: Progressing ?  ?Problem: Activity: ?Goal: Risk for activity intolerance will decrease ?Outcome: Progressing ?  ?Problem: Pain Managment: ?Goal: General experience of comfort will improve ?Outcome: Progressing ?  ?

## 2021-05-14 NOTE — Progress Notes (Addendum)
? ?Progress Note ? ?Patient Name: Peter Oneill ?Date of Encounter: 05/14/2021 ? ?[facility] Cardiologist: Larae Grooms, MD  ? ?Subjective  ? ?Patient continues to have significant swelling. He denies any SOB with sitting upright.  ? ?Inpatient Medications  ?  ?Scheduled Meds: ? aspirin EC  81 mg Oral Daily  ? Chlorhexidine Gluconate Cloth  6 each Topical Q0600  ? clopidogrel  75 mg Oral Daily  ? ezetimibe  10 mg Oral Daily  ? heparin  5,000 Units Subcutaneous Q8H  ? pantoprazole  40 mg Oral Daily  ? rosuvastatin  10 mg Oral Daily  ? sevelamer carbonate  1,600 mg Oral TID WC  ? umeclidinium-vilanterol  1 puff Inhalation Daily  ? ?Continuous Infusions: ? sodium chloride    ? sodium chloride    ? DOPamine 2.5 mcg/kg/min (05/14/21 1200)  ? [START ON 05/15/2021] ferric gluconate (FERRLECIT) IVPB    ? ?PRN Meds: ?sodium chloride, sodium chloride, acetaminophen, alteplase, atropine, docusate sodium, heparin, lidocaine (PF), lidocaine-prilocaine, ondansetron (ZOFRAN) IV, pentafluoroprop-tetrafluoroeth, polyethylene glycol  ? ?Vital Signs  ?  ?Vitals:  ? 05/14/21 1215 05/14/21 1230 05/14/21 1245 05/14/21 1300  ?BP: (!) 141/48 (!) 136/49 (!) 127/52 (!) 134/48  ?Pulse: (!) 46 (!) 54 (!) 49 (!) 47  ?Resp: 16 15 15 14   ?Temp:      ?TempSrc:      ?SpO2: 94% 94% 94% 94%  ?Weight:      ?Height:      ? ? ?Intake/Output Summary (Last 24 hours) at 05/14/2021 1527 ?Last data filed at 05/14/2021 1200 ?Gross per 24 hour  ?Intake 865.58 ml  ?Output 5000 ml  ?Net -4134.42 ml  ? ?Last 3 Weights 05/14/2021 05/14/2021 05/14/2021  ?Weight (lbs) 212 lb 8.4 oz 218 lb 0.6 oz 218 lb 0.6 oz  ?Weight (kg) 96.4 kg 98.9 kg 98.9 kg  ?   ? ?Telemetry  ?  ?Bradycardic, occasionally down to the 30s mostly 50s, some PVCs - Personally Reviewed ? ?ECG  ?  ?No new- Personally Reviewed ? ?Physical Exam  ? ?GEN: No acute distress.   ?Neck: JVD to the jaw ?Cardiac: Bradycardic, no murmurs, rubs, or gallops.  ?Respiratory: Clear to auscultation  bilaterally. ?GI: Soft, nontender, distended  ?MS: 3+ edema up to hips; Upper extremities , forearms edematous, No deformity. ?Neuro:  Nonfocal  ?Psych: Normal affect  ? ?Labs  ?  ?High Sensitivity Troponin:   ?Recent Labs  ?Lab 05/13/21 ?1020 05/13/21 ?1210  ?TROPONINIHS 44* 46*  ?   ?Chemistry ?Recent Labs  ?Lab 05/13/21 ?6948 05/13/21 ?1020 05/13/21 ?1040 05/13/21 ?1042 05/13/21 ?1203 05/13/21 ?1210 05/14/21 ?0119  ?NA 139  --    < > 136 139  --  136  ?K 5.2*  --    < > 5.4* 5.0  --  4.6  ?CL 104  --   --  105 104  --  100  ?CO2 14*  --   --   --  16*  --  17*  ?GLUCOSE 134*  --   --  124* 120*  --  113*  ?BUN 174*  --   --  >130* 173*  --  142*  ?CREATININE 8.91*  --   --  10.10* 9.16* 8.87* 8.09*  ?CALCIUM 8.1*  --   --   --  8.1*  --  8.5*  ?MG  --  3.8*  --   --   --   --  3.6*  ?PROT 5.9*  --   --   --   --   --   --   ?  ALBUMIN 2.9*  --   --   --  2.9*  --   --   ?AST 25  --   --   --   --   --   --   ?ALT 14  --   --   --   --   --   --   ?ALKPHOS 76  --   --   --   --   --   --   ?BILITOT 0.7  --   --   --   --   --   --   ?GFRNONAA 6*  --   --   --  5* 6* 6*  ?ANIONGAP 21*  --   --   --  19*  --  19*  ? < > = values in this interval not displayed.  ?  ?Lipids No results for input(s): CHOL, TRIG, HDL, LABVLDL, LDLCALC, CHOLHDL in the last 168 hours.  ?Hematology ?Recent Labs  ?Lab 05/13/21 ?5009 05/13/21 ?1040 05/13/21 ?1042 05/13/21 ?1210 05/14/21 ?0119  ?WBC 5.0  --   --  4.6 8.0  ?RBC 3.19*  --   --  3.11* 3.58*  ?HGB 10.1*   < > 11.2* 9.7* 11.0*  ?HCT 29.9*   < > 33.0* 29.3* 32.5*  ?MCV 93.7  --   --  94.2 90.8  ?MCH 31.7  --   --  31.2 30.7  ?MCHC 33.8  --   --  33.1 33.8  ?RDW 17.1*  --   --  16.8* 16.3*  ?PLT 72*  --   --  69* 72*  ? < > = values in this interval not displayed.  ? ?Thyroid  ?Recent Labs  ?Lab 05/13/21 ?1203 05/14/21 ?0119  ?TSH 7.500*  --   ?FREET4  --  0.69  ?  ?BNP ?Recent Labs  ?Lab 05/13/21 ?1020  ?BNP 1,173.0*  ?  ?DDimer No results for input(s): DDIMER in the last 168 hours.   ? ?Radiology  ?  ?DG Chest 2 View ? ?Result Date: 05/13/2021 ?CLINICAL DATA:  Shortness of breath EXAM: CHEST - 2 VIEW COMPARISON:  Previous studies including the examination of 04/10/2020 FINDINGS: Transverse diameter of heart is increased. Central pulmonary vessels are prominent. Are scattered pleural calcifications. There are patchy infiltrates in right parahilar region and both lower lung fields. There is blunting CP lateral CP angles. There is no pneumothorax. Tip right IJ dialysis catheter is seen in the superior vena cava. Metallic sutures seen in the sternum. IMPRESSION: There are patchy infiltrates in the right mid and both lower lung fields suggesting atelectasis/pneumonia. Small bilateral pleural effusions, more so on the left side. There scattered linear densities and pleural calcifications in both lungs suggesting scarring. Electronically Signed   By: Elmer Picker M.D.   On: 05/13/2021 10:06  ? ?ECHOCARDIOGRAM COMPLETE ? ?Result Date: 05/13/2021 ?   ECHOCARDIOGRAM REPORT   Patient Name:   Peter Oneill Date of Exam: 05/13/2021 Medical Rec #:  [id number]        Height:       69.0 in Accession #:    [id number]       Weight:       240.0 lb Date of Birth:  [birth year]         BSA:          2.232 m? Patient Age:    81 years         BP:  131/40 mmHg Patient Gender: M                HR:           56 bpm. Exam Location:  Inpatient Procedure: 2D Echo, Cardiac Doppler, Color Doppler and Intracardiac            Opacification Agent Indications:    Complete heart block  History:        Patient has prior history of Echocardiogram examinations, most                 recent 04/11/2020. CAD, Prior CABG; Risk Factors:Hypertension,                 Dyslipidemia and Sleep Apnea.  Sonographer:    Merrie Roof RDCS Referring Phys: [id number] [facility] D HARRIS  Sonographer Comments: Technically difficult study due to poor echo windows. IMPRESSIONS  1. Left ventricular ejection fraction, by estimation, is 55 to 60%. The  left ventricle has normal function. The left ventricle has no regional wall motion abnormalities. Left ventricular diastolic parameters are indeterminate.  2. Right ventricular systolic function is mildly reduced. The right ventricular size is mildly enlarged.  3. Left atrial size was mildly dilated.  4. The mitral valve is abnormal. Trivial mitral valve regurgitation. No evidence of mitral stenosis.  5. The aortic valve is tricuspid. There is mild calcification of the aortic valve. There is mild thickening of the aortic valve. Aortic valve regurgitation is trivial. Aortic valve sclerosis is present, with no evidence of aortic valve stenosis.  6. The inferior vena cava is normal in size with greater than 50% respiratory variability, suggesting right atrial pressure of 3 mmHg. FINDINGS  Left Ventricle: Left ventricular ejection fraction, by estimation, is 55 to 60%. The left ventricle has normal function. The left ventricle has no regional wall motion abnormalities. The left ventricular internal cavity size was normal in size. There is  no left ventricular hypertrophy. Left ventricular diastolic parameters are indeterminate. Right Ventricle: The right ventricular size is mildly enlarged. Right vetricular wall thickness was not assessed. Right ventricular systolic function is mildly reduced. Left Atrium: Left atrial size was mildly dilated. Right Atrium: Right atrial size was normal in size. Pericardium: There is no evidence of pericardial effusion. Mitral Valve: The mitral valve is abnormal. Mild mitral annular calcification. Trivial mitral valve regurgitation. No evidence of mitral valve stenosis. Tricuspid Valve: The tricuspid valve is normal in structure. Tricuspid valve regurgitation is mild . No evidence of tricuspid stenosis. Aortic Valve: The aortic valve is tricuspid. There is mild calcification of the aortic valve. There is mild thickening of the aortic valve. Aortic valve regurgitation is trivial. Aortic  valve sclerosis is present, with no evidence of aortic valve stenosis. Pulmonic Valve: The pulmonic valve was normal in structure. Pulmonic valve regurgitation is trivial. No evidence of pulmonic stenosis. Aorta: The

## 2021-05-14 NOTE — Progress Notes (Incomplete)
STUDENT FOLLOW-UP NOTES - DISREGARD ? ? ? ? ? ? ? ? ? ? ? ? ?CC: bradycardia ? ?HPI: 44 YOM with PMH of CAD s/p CABG in 2003 and PCI in 2004, then repeat CABG in 01/2015, and PCI in 1941, diastolic+systolic HF, bilateral carotid artery disease, asbestosis, HTN, HLD, ESRD s/p RIJ [facility] 2/28, GERD presented with worsening edema, SOB, volume overload, severe bradycardia and reports about a 40 pound weight gain over the past few days/weeks. Admitted to ICU for HD initiation. ? ? ?Allergies  ?Allergen Reactions  ? Atorvastatin Other (See Comments)  ?  Muscle aches  ?  ?Past Medical History:  ?Diagnosis Date  ? Asbestosis ([place])   ? Contraindication to percutaneous coronary intervention (PCI)   ? Coronary artery disease   ? Dyslipidemia   ? Dyspnea   ? History of anemia   ? Hypercholesterolemia   ? Hypertension   ? Ileus ([place])   ? Pleural effusion on left   ? Pneumonia   ? Renal failure   ? Sleep apnea   ?  ?Family History  ?Problem Relation Age of Onset  ? Heart disease Father   ? ?Today's Vitals  ? 05/14/21 0700 05/14/21 0717 05/14/21 0730 05/14/21 0745  ?BP: (!) 142/57 (!) 146/65 126/63 (!) 133/56  ?Pulse: (!) 54 63 64 65  ?Resp: 14 16 16 14   ?Temp:  (!) 96.3 ?F (35.7 ?C)    ?TempSrc:      ?SpO2: 94% 93% 94% 93%  ?Weight:      ?Height:      ?PainSc:      ? ?Body mass index is 32.2 kg/m?.  ? ?Labs:  ?Imaging: CXR showed patchy infiltrates suggesting atelectasis/PNA and small bilteral pleural effusions; EF 50-55% with regional wall motion abnormalities/anteroseptal hypokinesis/LV hypertrophy. Enlarged RV; carotid ultrasound noted 40-59% stenosis ICAs and >50% stenosis ECA ? ? ?A/P ?24H ?No acute changes overnight but patient expressing difficulty dealing with health changes and desire to pass away ?BPs in the 150s (MAP 70-80s), HR much improved in the 60-70s, still 2nd degree heart block but NSR ?-f/u PTH, lipid panel, CBC, BMP ?-f/u dopamine utility - f/u with electrician ?-renvela added (LBM 3/20, phos ***) ?-iron added  qMWF (iron 44 >> ***) ? ? ?ESRD on HD ?SCr 8.09, BUN 142, Phos 11.4, Mg 3.6, K 4.6, Ca 8.5, Plts 69 >> 72, HR in 40s ?No adjustments to medications needed at this time; still fluid overloaded on exam; ~5L/48h removed  ?-nephrology consult; slow-start protocol (HD 1 yest, 2 today, 3 tom, then 4th tx on Friday)  ?-ctm new meds for adjustment ?-ferrlecit ordered (Iron 44) ?-renvela 1600mg  TID wm ?-goal sbp > 100, map > 60, HR > 40 ? ?AoC HF ?108.9kg >> 98.9kg >> 107.3kg; BNP 1173, hsTn 46, SBP 120-140, 2nd degree heart block+AF, EF 55-60% ?GDMT: none ?GDMT PTA: carvedilol, lasix, ?-amlodipine PTA, consider d/c? ?-switch carvedilol to metoprolol (dialyzable) ?-consider ACE/ARB/MRA when appropriate? ? -give ACE/ARB after dialysis on HD days ? -can consider MRA if benefit outweigh risk ?-CTM weights, renal fxn, lytes ? ?CAD ?LDL 66, VLDL 18, HDL 41, TG 92, AST/ALT 25/14 ?-restarted PTA crestor 10, zetia, ASA, Plavix ?-switch crestor 10 to atorvastatin 40/80 d/t renal clearance and to achieve high-intensity  ? ?HTN/HLD ?SBP 120-150s, MAPs 70-80s, HR 50-60s; on dopamine 56mcg/min, anticipate improvement with volume removal ?-f/u dopamine taper ?-HLD/HTN management as above ? ?Lung disease secondary to asbesto exposure ?CT: calcified pleural plaques; Anoro ellipta PTA (antimuscarinic, beta2 agonist)  ?-  Restart anoro?  ? ? ?BPH ?Rapaflo PTA CI in CrCl < 30 ?-consider flomax?  ? ?Gout ?Allopurinol 100mg  QD PTA ?-adjust allopurinol to 50mg  twice weekly at d/c since new HD patient? Titrate as needed? ? ? ?Anticoag:  ? ?ID: Hep B Ag negative, Hep B S Ab reactive ? ?PTA Med Issues: rapaflo (CI in CrCl < 30); allopurinol  ? ?Additional Notes:  ? ?

## 2021-05-14 NOTE — Progress Notes (Signed)
?[facility] ?Progress Note  ? ? ?Assessment/ Plan:   ?CKD5, now ESRD ?-follows w/ Dr. Justin Mend as an outpatient ?-started HD 3/20. HD#2 now, HD#3 planned for tomorrow (slow start protocol). Anticipating that his 4th treatment will be on Friday ?-CLIP ?-Continue to monitor daily Cr, Dose meds for GFR<15 ?-Maintain MAP>65 for optimal renal perfusion.  ?-Avoid nephrotoxic medications including NSAIDs and iodinated intravenous contrast exposure unless the latter is absolutely indicated.  Preferred narcotic agents for pain control are hydromorphone, fentanyl, and methadone. Morphine should not be used. Avoid Baclofen and avoid oral sodium phosphate and magnesium citrate based laxatives / bowel preps. Continue strict Input and Output monitoring. Will monitor the patient closely with you and intervene or adjust therapy as indicated by changes in clinical status/labs  ?  ?Hypervolemia, acute on chronic dCHF exacerbation, ESRD ?-failed outpatient diuresis ?-UF as tolerated on HD ?-daily weights ?  ?Bradycardia ?-appreciate recommendations from cardiology ?-on dopamine gtt ?  ?Hyperkalemia ?-improved ?  ?AGMA 2/2 ESRD ?-improving w/ HD ?  ?Anemia due to chronic kidney disease: ?-Transfuse for Hgb<7 g/dL ?-Hgb 11 ?-will give iron load for a total of 1g w/ HD ?  ?Dialysis access ?-s/p Left BC AVF placed 04/23/21--maturing ?-s/p RIJ [facility] 05/13/21 ([facility]) ? ?Subjective:   ?Patient seen and examined in ICU and on HD. HD currently running well. No acute events, feels better after HD yesterday. On dopamine gtt  ? ?Objective:   ?BP (!) 133/51   Pulse 61   Temp (!) 96.3 ?F (35.7 ?C)   Resp 16   Ht 5\' 9"  (1.753 m)   Wt 98.9 kg   SpO2 94%   BMI 32.20 kg/m?  ? ?Intake/Output Summary (Last 24 hours) at 05/14/2021 0915 ?Last data filed at 05/14/2021 0800 ?Gross per 24 hour  ?Intake 801.68 ml  ?Output 2000 ml  ?Net -1198.32 ml  ? ?Weight change:  ? ?Physical Exam: ?Gen:nad ?VOH:[id number] ?Resp:diminished air entry bibasilar,  normal wob ?SWN:IOEVOJJKK but softer today ?Ext:2+ pitting edema b/l LE's ?Neuro: awake, alert ?Dialysis access: RIJ TDC (in use), LUE AVF +thrill (maturing) ? ?Imaging: ?DG Chest 2 View ? ?Result Date: 05/13/2021 ?CLINICAL DATA:  Shortness of breath EXAM: CHEST - 2 VIEW COMPARISON:  Previous studies including the examination of 04/10/2020 FINDINGS: Transverse diameter of heart is increased. Central pulmonary vessels are prominent. Are scattered pleural calcifications. There are patchy infiltrates in right parahilar region and both lower lung fields. There is blunting CP lateral CP angles. There is no pneumothorax. Tip right IJ dialysis catheter is seen in the superior vena cava. Metallic sutures seen in the sternum. IMPRESSION: There are patchy infiltrates in the right mid and both lower lung fields suggesting atelectasis/pneumonia. Small bilateral pleural effusions, more so on the left side. There scattered linear densities and pleural calcifications in both lungs suggesting scarring. Electronically Signed   By: Elmer Picker M.D.   On: 05/13/2021 10:06  ? ?ECHOCARDIOGRAM COMPLETE ? ?Result Date: 05/13/2021 ?   ECHOCARDIOGRAM REPORT   Patient Name:   Peter Oneill Date of Exam: 05/13/2021 Medical Rec #:  [id number]        Height:       69.0 in Accession #:    [id number]       Weight:       240.0 lb Date of Birth:  Apr 30, 1940         BSA:          2.232 m? Patient Age:    81 years  BP:           131/40 mmHg Patient Gender: M                HR:           56 bpm. Exam Location:  Inpatient Procedure: 2D Echo, Cardiac Doppler, Color Doppler and Intracardiac            Opacification Agent Indications:    Complete heart block  History:        Patient has prior history of Echocardiogram examinations, most                 recent 04/11/2020. CAD, Prior CABG; Risk Factors:Hypertension,                 Dyslipidemia and Sleep Apnea.  Sonographer:    Merrie Roof RDCS Referring Phys: [phone] [facility]  Sonographer  Comments: Technically difficult study due to poor echo windows. IMPRESSIONS  1. Left ventricular ejection fraction, by estimation, is 55 to 60%. The left ventricle has normal function. The left ventricle has no regional wall motion abnormalities. Left ventricular diastolic parameters are indeterminate.  2. Right ventricular systolic function is mildly reduced. The right ventricular size is mildly enlarged.  3. Left atrial size was mildly dilated.  4. The mitral valve is abnormal. Trivial mitral valve regurgitation. No evidence of mitral stenosis.  5. The aortic valve is tricuspid. There is mild calcification of the aortic valve. There is mild thickening of the aortic valve. Aortic valve regurgitation is trivial. Aortic valve sclerosis is present, with no evidence of aortic valve stenosis.  6. The inferior vena cava is normal in size with greater than 50% respiratory variability, suggesting right atrial pressure of 3 mmHg. FINDINGS  Left Ventricle: Left ventricular ejection fraction, by estimation, is 55 to 60%. The left ventricle has normal function. The left ventricle has no regional wall motion abnormalities. The left ventricular internal cavity size was normal in size. There is  no left ventricular hypertrophy. Left ventricular diastolic parameters are indeterminate. Right Ventricle: The right ventricular size is mildly enlarged. Right vetricular wall thickness was not assessed. Right ventricular systolic function is mildly reduced. Left Atrium: Left atrial size was mildly dilated. Right Atrium: Right atrial size was normal in size. Pericardium: There is no evidence of pericardial effusion. Mitral Valve: The mitral valve is abnormal. Mild mitral annular calcification. Trivial mitral valve regurgitation. No evidence of mitral valve stenosis. Tricuspid Valve: The tricuspid valve is normal in structure. Tricuspid valve regurgitation is mild . No evidence of tricuspid stenosis. Aortic Valve: The aortic valve is  tricuspid. There is mild calcification of the aortic valve. There is mild thickening of the aortic valve. Aortic valve regurgitation is trivial. Aortic valve sclerosis is present, with no evidence of aortic valve stenosis. Pulmonic Valve: The pulmonic valve was normal in structure. Pulmonic valve regurgitation is trivial. No evidence of pulmonic stenosis. Aorta: The aortic root is normal in size and structure. Venous: The inferior vena cava is normal in size with greater than 50% respiratory variability, suggesting right atrial pressure of 3 mmHg. IAS/Shunts: No atrial level shunt detected by color flow Doppler.  LEFT VENTRICLE PLAX 2D LVIDd:         4.00 cm   Diastology LVIDs:         3.00 cm   LV e' medial:    6.64 cm/s LV PW:         1.10 cm   LV E/e' medial:  19.9 LV IVS:        1.10 cm   LV e' lateral:   10.00 cm/s LVOT diam:     2.00 cm   LV E/e' lateral: 13.2 LV SV:         113 LV SV Index:   51 LVOT Area:     3.14 cm?  RIGHT VENTRICLE RV S prime:     7.88 cm/s TAPSE (M-mode): 1.6 cm LEFT ATRIUM             Index        RIGHT ATRIUM           Index LA diam:        4.00 cm 1.79 cm/m?   RA Area:     28.00 cm? LA Vol (A2C):   75.2 ml 33.68 ml/m?  RA Volume:   106.00 ml 47.48 ml/m? LA Vol (A4C):   97.8 ml 43.81 ml/m? LA Biplane Vol: 94.7 ml 42.42 ml/m?  AORTIC VALVE LVOT Vmax:   180.00 cm/s LVOT Vmean:  99.400 cm/s LVOT VTI:    0.359 m  AORTA Ao Root diam: 3.10 cm Ao Asc diam:  3.40 cm MITRAL VALVE MV Area (PHT): 2.31 cm?     SHUNTS MV Decel Time: 329 msec     Systemic VTI:  0.36 m MV E velocity: 132.00 cm/s  Systemic Diam: 2.00 cm MV A velocity: 100.00 cm/s MV E/A ratio:  1.32 Jenkins Rouge MD Electronically signed by Jenkins Rouge MD Signature Date/Time: 05/13/2021/4:11:06 PM    Final    ? ?Labs: ?BMET ?Recent Labs  ?Lab 05/13/21 ?7169 05/13/21 ?1040 05/13/21 ?1042 05/13/21 ?1203 05/13/21 ?1210 05/14/21 ?0119  ?NA 139 134* 136 139  --  136  ?K 5.2* 4.9 5.4* 5.0  --  4.6  ?CL 104  --  105 104  --  100  ?CO2 14*  --    --  16*  --  17*  ?GLUCOSE 134*  --  124* 120*  --  113*  ?BUN 174*  --  >130* 173*  --  142*  ?CREATININE 8.91*  --  10.10* 9.16* 8.87* 8.09*  ?CALCIUM 8.1*  --   --  8.1*  --  8.5*  ?PHOS  --   --   --  >30.

## 2021-05-14 NOTE — Procedures (Signed)
removed 3052mls net fluid no complaints.  HR dipped in high 30s for apprx 5 seconds without symptom then returned to baseline 60;s.  pre bp 146/65 post bp 123/64 pre weight 98.9kg post weight 95.5kg bed scales.  catheter ran well packed with heparin clamped and capped.  ?

## 2021-05-15 DIAGNOSIS — I441 Atrioventricular block, second degree: Secondary | ICD-10-CM

## 2021-05-15 DIAGNOSIS — N289 Disorder of kidney and ureter, unspecified: Secondary | ICD-10-CM

## 2021-05-15 DIAGNOSIS — I459 Conduction disorder, unspecified: Secondary | ICD-10-CM | POA: Diagnosis not present

## 2021-05-15 LAB — CBC
HCT: 30.7 % — ABNORMAL LOW (ref 39.0–52.0)
Hemoglobin: 10.7 g/dL — ABNORMAL LOW (ref 13.0–17.0)
MCH: 31.7 pg (ref 26.0–34.0)
MCHC: 34.9 g/dL (ref 30.0–36.0)
MCV: 90.8 fL (ref 80.0–100.0)
Platelets: 54 10*3/uL — ABNORMAL LOW (ref 150–400)
RBC: 3.38 MIL/uL — ABNORMAL LOW (ref 4.22–5.81)
RDW: 16.6 % — ABNORMAL HIGH (ref 11.5–15.5)
WBC: 6.8 10*3/uL (ref 4.0–10.5)
nRBC: 1.2 % — ABNORMAL HIGH (ref 0.0–0.2)

## 2021-05-15 LAB — BASIC METABOLIC PANEL
Anion gap: 17 — ABNORMAL HIGH (ref 5–15)
BUN: 113 mg/dL — ABNORMAL HIGH (ref 8–23)
CO2: 20 mmol/L — ABNORMAL LOW (ref 22–32)
Calcium: 8.4 mg/dL — ABNORMAL LOW (ref 8.9–10.3)
Chloride: 99 mmol/L (ref 98–111)
Creatinine, Ser: 7.58 mg/dL — ABNORMAL HIGH (ref 0.61–1.24)
GFR, Estimated: 7 mL/min — ABNORMAL LOW (ref >60)
Glucose, Bld: 99 mg/dL (ref 70–99)
Potassium: 4.6 mmol/L (ref 3.5–5.1)
Sodium: 136 mmol/L (ref 135–145)

## 2021-05-15 LAB — LIPID PANEL
Cholesterol: 80 mg/dL (ref 0–200)
HDL: 50 mg/dL (ref >40)
LDL Cholesterol: 21 mg/dL (ref 0–99)
Total CHOL/HDL Ratio: 1.6 RATIO
Triglycerides: 43 mg/dL (ref <150)
VLDL: 9 mg/dL (ref 0–40)

## 2021-05-15 MED ORDER — DIPHENHYDRAMINE HCL 50 MG/ML IJ SOLN
25.0000 mg | Freq: Once | INTRAMUSCULAR | Status: AC
  Administered 2021-05-15: 25 mg via INTRAVENOUS
  Filled 2021-05-15: qty 1

## 2021-05-15 NOTE — Progress Notes (Signed)
? ?Progress Note ? ?Patient Name: Peter Oneill ?Date of Encounter: 05/15/2021 ? ?[facility] Cardiologist: Larae Grooms, MD  ? ?Subjective  ? ?Patient continues to have significant swelling. He continues to deny chest pain and shortness of breath. He is having some back pain from sitting in the bed.  ? ?Inpatient Medications  ?  ?Scheduled Meds: ? aspirin EC  81 mg Oral Daily  ? Chlorhexidine Gluconate Cloth  6 each Topical Q0600  ? clopidogrel  75 mg Oral Daily  ? ezetimibe  10 mg Oral Daily  ? heparin  5,000 Units Subcutaneous Q8H  ? pantoprazole  40 mg Oral Daily  ? rosuvastatin  10 mg Oral Daily  ? sevelamer carbonate  1,600 mg Oral TID WC  ? umeclidinium-vilanterol  1 puff Inhalation Daily  ? ?Continuous Infusions: ? sodium chloride    ? sodium chloride    ? DOPamine 2.5 mcg/kg/min (05/15/21 1600)  ? ferric gluconate (FERRLECIT) IVPB    ? ?PRN Meds: ?sodium chloride, sodium chloride, acetaminophen, alteplase, atropine, docusate sodium, heparin, lidocaine (PF), lidocaine-prilocaine, ondansetron (ZOFRAN) IV, pentafluoroprop-tetrafluoroeth, polyethylene glycol  ? ?Vital Signs  ?  ?Vitals:  ? 05/15/21 1506 05/15/21 1511 05/15/21 1530 05/15/21 1600  ?BP: (!) 144/60 120/61 (!) 118/41 (!) 113/38  ?Pulse: (!) 52 (!) 53 (!) 54 (!) 55  ?Resp: 14 16 14 14   ?Temp: 97.6 ?F (36.4 ?C)     ?TempSrc: Oral     ?SpO2: 95%   95%  ?Weight: 107 kg     ?Height:      ? ? ?Intake/Output Summary (Last 24 hours) at 05/15/2021 1619 ?Last data filed at 05/15/2021 1600 ?Gross per 24 hour  ?Intake 470.71 ml  ?Output --  ?Net 470.71 ml  ? ? ? ?  05/15/2021  ?  3:06 PM 05/15/2021  ?  5:00 AM 05/14/2021  ?  9:38 AM  ?Last 3 Weights  ?Weight (lbs) 235 lb 14.3 oz 236 lb 8.9 oz 212 lb 8.4 oz  ?Weight (kg) 107 kg 107.3 kg 96.4 kg  ?   ? ?Telemetry  ?  ?Bradycardic, occasionally down to the 30s, some PVCs - Personally Reviewed ? ?ECG  ?  ?No new- Personally Reviewed ? ? ?Constitutional: Well-developed, well-nourished, and in no distress.   ?HENT:  ?Head: Normocephalic and atraumatic.  ?Eyes: EOM are normal.  ?Neck: Normal range of motion. JVD to the jaw  ?Cardiovascular: bradycardic intact distal pulses. No gallop and no friction rub.  ?No murmur heard. 3 + pitting edema to the hips, upper extremities L>R, and abdomen edematous  ?Pulmonary: Non labored breathing on room air, no wheezing or rales  ?Abdominal: Soft. Normal bowel sounds. Non distended and non tender ?Musculoskeletal: Normal range of motion.     ?   General: No tenderness or edema.  ?Neurological: Alert and oriented to person, place, and time. Non focal  ?Skin: Skin is warm and dry.  ? ? ?Labs  ?  ?High Sensitivity Troponin:   ?Recent Labs  ?Lab 05/13/21 ?1020 05/13/21 ?1210  ?TROPONINIHS 44* 46*  ? ?   ?Chemistry ?Recent Labs  ?Lab 05/13/21 ?0630 05/13/21 ?1020 05/13/21 ?1040 05/13/21 ?1203 05/13/21 ?1210 05/14/21 ?0119 05/15/21 ?0750  ?NA 139  --    < > 139  --  136 136  ?K 5.2*  --    < > 5.0  --  4.6 4.6  ?CL 104  --    < > 104  --  100 99  ?CO2 14*  --   --  16*  --  17* 20*  ?GLUCOSE 134*  --    < > 120*  --  113* 99  ?BUN 174*  --    < > 173*  --  142* 113*  ?CREATININE 8.91*  --    < > 9.16* 8.87* 8.09* 7.58*  ?CALCIUM 8.1*  --   --  8.1*  --  8.5* 8.4*  ?MG  --  3.8*  --   --   --  3.6*  --   ?PROT 5.9*  --   --   --   --   --   --   ?ALBUMIN 2.9*  --   --  2.9*  --   --   --   ?AST 25  --   --   --   --   --   --   ?ALT 14  --   --   --   --   --   --   ?ALKPHOS 76  --   --   --   --   --   --   ?BILITOT 0.7  --   --   --   --   --   --   ?GFRNONAA 6*  --   --  5* 6* 6* 7*  ?ANIONGAP 21*  --   --  19*  --  19* 17*  ? < > = values in this interval not displayed.  ? ?  ?Lipids  ?Recent Labs  ?Lab 05/15/21 ?0750  ?CHOL 80  ?TRIG 43  ?HDL 50  ?[place] 21  ?CHOLHDL 1.6  ?  ?Hematology ?Recent Labs  ?Lab 05/13/21 ?1210 05/14/21 ?0119 05/15/21 ?0750  ?WBC 4.6 8.0 6.8  ?RBC 3.11* 3.58* 3.38*  ?HGB 9.7* 11.0* 10.7*  ?HCT 29.3* 32.5* 30.7*  ?MCV 94.2 90.8 90.8  ?MCH 31.2 30.7 31.7  ?MCHC  33.1 33.8 34.9  ?RDW 16.8* 16.3* 16.6*  ?PLT 69* 72* 54*  ? ? ?Thyroid  ?Recent Labs  ?Lab 05/13/21 ?1203 05/14/21 ?0119  ?TSH 7.500*  --   ?FREET4  --  0.69  ? ?  ?BNP ?Recent Labs  ?Lab 05/13/21 ?1020  ?BNP 1,173.0*  ? ?  ?DDimer No results for input(s): DDIMER in the last 168 hours.  ? ?Radiology  ?  ?No results found. ? ?Cardiac Studies  ? ?Trans thoracic Echo 05/13/2021 ?IMPRESSIONS ?Left ventricular ejection fraction, by estimation, is 55 to 60%. The left ventricle has ?normal function. The left ventricle has no regional wall motion abnormalities. Left ?ventricular diastolic parameters are indeterminate. ?Right ventricular systolic function is mildly reduced. The right ventricular size is ?mildly enlarged. ?2. Left atrial size was mildly dilated. ?The mitral valve is abnormal. Trivial mitral valve regurgitation. No evidence of mitral ?stenosis. ?3. The aortic valve is tricuspid. There is mild calcification of the aortic valve. There is ?mild thickening of the aortic valve. Aortic valve regurgitation is trivial. Aortic valve ?sclerosis is present, with no evidence of aortic valve stenosis. ?4. The inferior vena cava is normal in size with greater than 50% respiratory variability, ?suggesting right atrial pressure of 3 mmHg. ? ?Trans thoracic Echocardiogram 04/11/2020: ?Impressions: ? 1. Left ventricular ejection fraction, by estimation, is 50 to 55%. The  ?left ventricle has low normal function. The left ventricle demonstrates  ?regional wall motion abnormalities. Anteroseptal hypokinesis. There is  ?mild left ventricular hypertrophy.  ?Left ventricular diastolic parameters are indeterminate.  ? 2. Right ventricular systolic function is mildly reduced. The right  ?  ventricular size is mildly enlarged. Tricuspid regurgitation signal is  ?inadequate for assessing PA pressure.  ? 3. The mitral valve is grossly normal. No evidence of mitral valve  ?regurgitation.  ? 4. The aortic valve was not well visualized. Aortic  valve regurgitation  ?is not visualized. No aortic stenosis is present.  ? ?  ?Carotid Ultrasound 03/29/2021: ?Summary: ?- Right Carotid: Velocities in the right ICA are consistent with a 40-59% stenosis. The ECA appears >50% stenosed.  ?- Left Carotid: Velocities in the left ICA are consistent with a 40-59% stenosis. The ECA appears >50% stenosed.  ?- Vertebrals:  Bilateral vertebral arteries demonstrate antegrade flow. ?- Subclavians: Normal flow hemodynamics were seen in bilateral subclavian arteries. Poorly visualized.  ? ?Patient Profile  ?   ?Peter Oneill is a 81 y.o. male with a history of extensive CAD s/p CABG in 2003 with subsequent PCI in 2004, repeat CABG x2 (RIMA to LAD and SVG to Diag) in 01/2015, and most recently PCI in 11/2015 in [place], [place]. Also has a history of chronic diastolic CHF, bilateral carotid artery disease s/p prior CEA, asbestosis, hypertension, hyperlipidemia, ESRD on, and GERD who is being seen 05/13/2021 for the evaluation of bradycardia at the request of Dr. Dina Rich. ? ?Assessment & Plan  ?  ?Bradycardia ?Patient has baseline conduction disorder with prolonged PR interval and Wenckebach block at baseline. He now has potential progression to more high grade block. This could be in the setting of his beta blocker use though this likely should have resolved by now. Patient has required dopamine to maintain his blood pressures and his HR >40. Had an episode of bradycardia to 39 with dopamine at 2.37mcg and it was subsequently titrated to 54mcg. Dopamine was titrated to 2.35mcg again this AM and HR were stable in the 40s and 50s.  ?-Will continue dopamine for now and gradually wean this, maintain HR >40 ?-Avoid any nodal blocking agents at this time  ? ?Acute on Chronic Diastolic CHF ?Patient presented with progressive dyspnea, abdominal distension, lower extremity edema, and 40lb weight gain over the last 3-4 weeks. BNP on presentation elevated to 1,1723. Diuresis was attempted in the  outpatient setting. iHD was initiated 3/20. Patient had 2L of UF removed. 3L of UF removed yesterday. He remains hypervolemic on exam with significant edema of his lower extremities and abdomen. Nephrolo

## 2021-05-15 NOTE — Progress Notes (Signed)
? ?NAME:  Peter Oneill, MRN:  [id number], DOB:  [birth year], LOS: 2 ?ADMISSION DATE:  05/13/2021, CONSULTATION DATE:  05/13/2021 ?REFERRING MD:  , CHIEF COMPLAINT:  Dyspnea, Bradycardia   ? ?History of Present Illness:  ?Peter Oneill is a/an 81 y.o. male with a past medical history of CKD5, HTN, OSA, asbestosis, CAD s/p CABG who presents to [facility] with worsening edema, SOB, volume overload. Is s/p RIJ TDC placement today with Dr. Augustin Coupe as an outpatient at [facility] but was sent directly here after catheter placement for dyspnea. Had been on lasix 120mg  BID w/ metolazone 10mg  daily as an outpatient. Was found to be bradycardic here  in the ED on 3/20 with HR in the 20's, which responded to atropine, now in the 40's on Dopamine . ? Underlying heart Block vs QRS prolongation.  Plan is to initiate bedside HD today in the ICU  ? ?Pertinent  Medical History  ? ?Past Medical History:  ?Diagnosis Date  ? Asbestosis ([place])   ? Contraindication to percutaneous coronary intervention (PCI)   ? Coronary artery disease   ? Dyslipidemia   ? Dyspnea   ? History of anemia   ? Hypercholesterolemia   ? Hypertension   ? Ileus ([place])   ? Pleural effusion on left   ? Pneumonia   ? Renal failure   ? Sleep apnea   ? CABG 2003, coronary stenting 2004, repeat CABG 2016, repeat stenting 2017.  Also with COPD, CKD 4, bilateral carpal tunnel release. ? ?Significant Hospital Events: ?Including procedures, antibiotic start and stop dates in addition to other pertinent events   ?04/23/2021 left-sided brachiocephalic fistula creation ?05/13/2021 [place] cath placed by Dr. Augustin Coupe for emergent HD.  ?05/13/2021 Admitted from [facility] to [facility] ED for dyspnea and bradycardia into the 20's  requiring atropine and dopamine , ? Heart Block ( Cardiology consulted)  , volume overload.  ?First HD scheduled for today as bedside in the ICU ?Echo 03/2020 LVEF of 50-55% with anterior septal hypokinesis mild LVH as well as mildly enlarged RV with mildly reduced systolic function. ? ?Interim  History / Subjective:  ? ?Plans for HD this afternoon.  Blood pressure remained stable.  Did have episode of bradycardia overnight.  Restarted on dopamine at 5 mics per minute. ? ?Objective   ?Blood pressure (!) 152/64, pulse 81, temperature (!) 97.3 ?F (36.3 ?C), temperature source Oral, resp. rate 19, height 5\' 9"  (1.753 m), weight 107.3 kg, SpO2 92 %. ?   ?   ? ?Intake/Output Summary (Last 24 hours) at 05/15/2021 9381 ?Last data filed at 05/15/2021 0600 ?Gross per 24 hour  ?Intake 534.5 ml  ?Output 3000 ml  ?Net -2465.5 ml  ? ?Filed Weights  ? 05/14/21 0657 05/14/21 0938 05/15/21 0500  ?Weight: 98.9 kg 96.4 kg 107.3 kg  ? ? ?Examination: ?General: Obese elderly male sitting up in bed ?HEENT: NCAT, sclera clear, tracking appropriately ?Neuro: Alert oriented following commands moves all 4 extremities ?CV: Heart rate low to mid 60s, regular, S1-S2 ?PULM: Clear to auscultation bilaterally no crackles no wheeze ?GI: Soft, nontender nondistended ?Extremities: Bilateral lower extremity pitting edema ?Skin: Sacral pressure wound documented per nursing. ? ? ?Resolved Hospital Problem list   ? ? ?Assessment & Plan:  ? ?Bradycardia ?-? wenkebach block vs complete heart block on presentation. Responded to atropine ?P: ?Remains on dopamine infusion. ?Drop dopamine infusion to 2.5 mcg/min ? ?ESRD ?-Creatinine on admit 9.16, GFR 5 ?P: ?New HD initiation in the intensive care unit. ?Repeat HD again today. ? ?  Acute on chronic diastolic HF ?Hx CAD ?HTN  ?HLD  ?-Patient presented with concerns of acute CHF given progressive dyspnea, abdominal distention, lower extremity edema, and elevated BNP ?-Last echo 06/27/2020 revealed EF 50 to 55% with anterior septal hypokinesis and mild LVH ?P: ?Start aspirin, Plavix, statin ?Volume removal with HD ? ?Restrictive lung disease due to asbestos exposure with related pleural plaques ?Hx OSA, intolerant of CPAP ?Hx COPD ?P: ?Continue Anora Ellipta ?I-S mobility pulmonary hygiene ? ?Electrolyte  abnormalities: ?Hyperkalemia ?Hypomagnesemia ?P: ?Follow basic metabolic panel, replete electrolytes as needed ?Repeat electrolytes pending. ? ?Best Practice (right click and "Reselect all SmartList Selections" daily)  ? ?Diet/type: Regular consistency (see orders) ?DVT prophylaxis: prophylactic heparin  ?GI prophylaxis: PPI ?Lines: Dialysis Catheter ?Foley:  N/A ?Code Status:  full code ?Last date of multidisciplinary goals of care discussion: Please see separate plan of care note dated 05/13/2021 ? ?Labs   ?CBC: ?Recent Labs  ?Lab 05/13/21 ?2130 05/13/21 ?1040 05/13/21 ?1042 05/13/21 ?1210 05/14/21 ?0119  ?WBC 5.0  --   --  4.6 8.0  ?NEUTROABS 4.3  --   --   --   --   ?HGB 10.1* 10.5* 11.2* 9.7* 11.0*  ?HCT 29.9* 31.0* 33.0* 29.3* 32.5*  ?MCV 93.7  --   --  94.2 90.8  ?PLT 72*  --   --  69* 72*  ? ? ?Basic Metabolic Panel: ?Recent Labs  ?Lab 05/13/21 ?8657 05/13/21 ?1020 05/13/21 ?1040 05/13/21 ?1042 05/13/21 ?1203 05/13/21 ?1210 05/14/21 ?0119  ?NA 139  --  134* 136 139  --  136  ?K 5.2*  --  4.9 5.4* 5.0  --  4.6  ?CL 104  --   --  105 104  --  100  ?CO2 14*  --   --   --  16*  --  17*  ?GLUCOSE 134*  --   --  124* 120*  --  113*  ?BUN 174*  --   --  >130* 173*  --  142*  ?CREATININE 8.91*  --   --  10.10* 9.16* 8.87* 8.09*  ?CALCIUM 8.1*  --   --   --  8.1*  --  8.5*  ?MG  --  3.8*  --   --   --   --  3.6*  ?PHOS  --   --   --   --  >30.0*  --  11.4*  ? ?GFR: ?Estimated Creatinine Clearance: 8.9 mL/min (A) (by C-G formula based on SCr of 8.09 mg/dL (H)). ?Recent Labs  ?Lab 05/13/21 ?8469 05/13/21 ?1210 05/14/21 ?0119  ?WBC 5.0 4.6 8.0  ? ? ?Liver Function Tests: ?Recent Labs  ?Lab 05/13/21 ?6295 05/13/21 ?1203  ?AST 25  --   ?ALT 14  --   ?ALKPHOS 76  --   ?BILITOT 0.7  --   ?PROT 5.9*  --   ?ALBUMIN 2.9* 2.9*  ? ?No results for input(s): LIPASE, AMYLASE in the last 168 hours. ?No results for input(s): AMMONIA in the last 168 hours. ? ?ABG ?   ?Component Value Date/Time  ? HCO3 14.2 (L) 05/13/2021 1040  ? TCO2  18 (L) 05/13/2021 1042  ? ACIDBASEDEF 10.0 (H) 05/13/2021 1040  ? O2SAT 100 05/13/2021 1040  ?  ? ?Coagulation Profile: ?No results for input(s): INR, PROTIME in the last 168 hours. ? ?Cardiac Enzymes: ?No results for input(s): CKTOTAL, CKMB, CKMBINDEX, TROPONINI in the last 168 hours. ? ?HbA1C: ?No results found for: HGBA1C ? ?CBG: ?No results for input(s):  GLUCAP in the last 168 hours. ? ? ?This patient is critically ill with multiple organ system failure; which, requires frequent high complexity decision making, assessment, support, evaluation, and titration of therapies. This was completed through the application of advanced monitoring technologies and extensive interpretation of multiple databases. During this encounter critical care time was devoted to patient care services described in this note for 34 minutes. ? ?Garner Nash, DO ?[facility] ?05/15/2021 8:22 AM  ? ? ? ?  ?

## 2021-05-15 NOTE — Progress Notes (Signed)
Chaplain responded to a request from patient RN for patient support.  Patient has begun to express to the RN that he cannot do this anymore.  Upon arrival the patient was in the bed and began to share how hard it is to have the health changes, not being able to get out of bed on his own, not being able to talk right, not be active, and now starting dialysis, he just says I can't keep this up, again repeating not being able to do for himself and especially dialysis.  Patient and spouse have been married 51 years and moved down here from [place] to be closer to family.  They have a son and daughter.  Patient reports spouse has been coming to see him. Chaplain provided reflective listening and allowing patient to express his feelings.  RN is going to request a consult from [place] and chaplain encouraged patient to talk with his family about his wishes and to speak with the medical team.  Patient identifies as Christian/Catholic, at this time declined chaplain calling their [place] will pass this information along to day chaplain to follow up.  Spiritual Care office available to assist team in any family meeting. ?[place], [place]. ? ? ? 05/15/21 0552  ?Clinical Encounter Type  ?Visited With Patient  ?Visit Type Initial;Psychological support;Spiritual support  ?Referral From Nurse  ?Consult/Referral To Chaplain  ?Spiritual Encounters  ?Spiritual Needs Prayer  ?Stress Factors  ?Patient Stress Factors Health changes  ? ? ?

## 2021-05-15 NOTE — Progress Notes (Signed)
This chaplain responded to [facility] consult for spiritual care. The chaplain read the chart notes and was updated by Voncille Lo before the visit. ? ?The Pt. is preparing for HD in the room when the chaplain arrived for the visit. The chaplain introduced herself and the Pt. replied "I'm going to give it one more try." The Pt. wife-Georgette and granddaughter Colletta [place] are in the room. The chaplain began rapport building with Georgette. Georgette questioned the night time chaplain's presence. Georgette accepted the chaplain's offer of a listening presence when the Pt. needed it. ? ?The Pt. accepted the chaplain's offer of F/U spiritual care on Thursday. ? ?Chaplain Sallyanne Kuster ?[phone] ?

## 2021-05-15 NOTE — Evaluation (Signed)
Occupational Therapy Evaluation ?Patient Details ?Name: Peter Oneill ?MRN: [id number] ?DOB: 1941/01/02 ?Today's Date: 05/15/2021 ? ? ?History of Present Illness 81 y.o. male with a past medical history of CKD5, HTN, OSA, asbestosis, CAD s/p CABG who presented to [facility] with worsening edema, SOB, volume overload.  ? ?Clinical Impression ?  ?Patient admitted for the diagnosis above.  PTA he lives with his spouse, walks with a RW, continues to drive, participates with meals and home management, and needed assist with lower body dressing due to chronic back pain. Deficits impacting independence are listed below.  Currently he is needing up to [place] for basic mobility, and up to Mod A for lower body ADL from a sit/stand level.  Continued OT recommended in the acute setting to maximize his functional status, and patient would benefit from post acute OT is agreed to.   ?   ? ?Recommendations for follow up therapy are one component of a multi-disciplinary discharge planning process, led by the attending physician.  Recommendations may be updated based on patient status, additional functional criteria and insurance authorization.  ? ?Follow Up Recommendations ? Home health OT versus outpatient.   ?  ?Assistance Recommended at Discharge Intermittent Supervision/Assistance  ?Patient can return home with the following A little help with walking and/or transfers;A little help with bathing/dressing/bathroom;Assistance with cooking/housework;Assist for transportation;Help with stairs or ramp for entrance ? ?  ?Functional Status Assessment ? Patient has had a recent decline in their functional status and demonstrates the ability to make significant improvements in function in a reasonable and predictable amount of time.  ?Equipment Recommendations ? None recommended by OT  ?  ?Recommendations for Other Services   ? ? ?  ?Precautions / Restrictions Precautions ?Precautions: Fall ?Restrictions ?Weight Bearing Restrictions: No ?Other  Position/Activity Restrictions: chronic back issues  ? ?  ? ?Mobility Bed Mobility ?Overal bed mobility: Needs Assistance ?Bed Mobility: Supine to Sit ?  ?  ?Supine to sit: Min guard, HOB elevated ?  ?  ?  ?  ? ?Transfers ?Overall transfer level: Needs assistance ?Equipment used: Rolling walker (2 wheels) ?Transfers: Sit to/from Stand, Bed to chair/wheelchair/BSC ?Sit to Stand: Min assist ?  ?  ?Step pivot transfers: Min assist ?  ?  ?  ?  ? ?  ?Balance Overall balance assessment: Needs assistance ?Sitting-balance support: Feet supported, Bilateral upper extremity supported ?Sitting balance-Leahy Scale: Fair ?  ?  ?Standing balance support: Reliant on assistive device for balance ?Standing balance-Leahy Scale: Poor ?  ?  ?  ?  ?  ?  ?  ?  ?  ?  ?  ?  ?   ? ?ADL either performed or assessed with clinical judgement  ? ?ADL   ?  ?  ?Grooming: Wash/dry hands;Wash/dry face;Set up;Sitting ?  ?  ?  ?  ?  ?Upper Body Dressing : Sitting;Minimal assistance ?  ?Lower Body Dressing: Moderate assistance;Sit to/from stand ?  ?Toilet Transfer: Minimal assistance;Ambulation;Rolling walker (2 wheels) ?  ?  ?  ?  ?  ?  ?General ADL Comments: hunched posture  ? ? ? ?Vision Baseline Vision/History: 1 Wears glasses ?Patient Visual Report: No change from baseline ?   ?   ?Perception Perception ?Perception: Within Functional Limits ?  ?Praxis Praxis ?Praxis: Intact ?  ? ?Pertinent Vitals/Pain Pain Assessment ?Pain Assessment: Faces ?Faces Pain Scale: No hurt ?Pain Intervention(s): Monitored during session  ? ? ? ?Hand Dominance Right ?  ?Extremity/Trunk Assessment Upper Extremity Assessment ?Upper Extremity Assessment:  Generalized weakness ?  ?  ?  ?Cervical / Trunk Assessment ?Cervical / Trunk Assessment: Kyphotic ?  ?Communication Communication ?Communication: No difficulties ?  ?Cognition Arousal/Alertness: Lethargic ?Behavior During Therapy: Flat affect ?Overall Cognitive Status: Impaired/Different from baseline ?Area of Impairment:  Orientation, Attention, Memory, Following commands, Awareness, Problem solving ?  ?  ?  ?  ?  ?  ?  ?  ?Orientation Level: Disoriented to, Place, Time ?Current Attention Level: Selective ?Memory: Decreased short-term memory ?Following Commands: Follows one step commands with increased time ?  ?Awareness: Emergent ?Problem Solving: Slow processing, Requires verbal cues ?  ?  ?  ?General Comments   VSS on RA ? ?  ?Exercises   ?  ?Shoulder Instructions    ? ? ?Home Living Family/patient expects to be discharged to:: Private residence ?Living Arrangements: Spouse/significant other ?Available Help at Discharge: Family;Available 24 hours/day ?Type of Home: House ?Home Access: Stairs to enter ?Entrance Stairs-Number of Steps: 2 ?Entrance Stairs-Rails: Left ?Home Layout: One level ?  ?  ?Bathroom Shower/Tub: Walk-in shower ?  ?Bathroom Toilet: Handicapped height ?Bathroom Accessibility: Yes ?How Accessible: Accessible via walker ?Home Equipment: Conservation officer, nature (2 wheels);Hand held shower head;Grab bars - tub/shower;Shower seat ?  ?  ?  ? ?  ?Prior Functioning/Environment Prior Level of Function : Independent/Modified Independent ?  ?  ?  ?  ?  ?  ?  ?ADLs Comments: Patient states continues to drive, manage meds, helps with meal prep and home management.  Completes his own ADL with assist for LB dressing from spouse. ?  ? ?  ?  ?OT Problem List: Decreased strength;Decreased activity tolerance;Impaired balance (sitting and/or standing);Decreased cognition;Increased edema ?  ?   ?OT Treatment/Interventions: Self-care/ADL training;Therapeutic exercise;Energy conservation;DME and/or AE instruction;Cognitive remediation/compensation;Therapeutic activities;Balance training;Patient/family education  ?  ?OT Goals(Current goals can be found in the care plan section) Acute Rehab OT Goals ?Patient Stated Goal: Return home ?OT Goal Formulation: With patient ?Time For Goal Achievement: 05/29/21 ?Potential to Achieve Goals: Fair  ?OT  Frequency: Min 2X/week ?  ? ?Co-evaluation PT/OT/SLP Co-Evaluation/Treatment: Yes ?Reason for Co-Treatment: Complexity of the patient's impairments (multi-system involvement);For patient/therapist safety ?  ?OT goals addressed during session: ADL's and self-care ?  ? ?  ?AM-PAC OT "6 Clicks" Daily Activity     ?Outcome Measure Help from another person eating meals?: None ?Help from another person taking care of personal grooming?: None ?Help from another person toileting, which includes using toliet, bedpan, or urinal?: A Little ?Help from another person bathing (including washing, rinsing, drying)?: A Lot ?Help from another person to put on and taking off regular upper body clothing?: A Little ?Help from another person to put on and taking off regular lower body clothing?: A Lot ?6 Click Score: 18 ?  ?End of Session Equipment Utilized During Treatment: Rolling walker (2 wheels) ?Nurse Communication: Mobility status ? ?Activity Tolerance: Patient limited by fatigue ?Patient left: in chair;with call bell/phone within reach;with chair alarm set;with family/visitor present ? ?OT Visit Diagnosis: Unsteadiness on feet (R26.81);Muscle weakness (generalized) (M62.81);Other symptoms and signs involving cognitive function  ?              ?Time: 3734-2876 ?OT Time Calculation (min): 23 min ?Charges:  OT General Charges ?$OT Visit: 1 Visit ?OT Evaluation ?$OT Eval Moderate Complexity: 1 Mod ? ?05/15/2021 ? ?RP, OTR/L ? ?Acute Rehabilitation Services ? ?Office:  [phone] ? ? ?Vallerie Hentz D Natacia Chaisson ?05/15/2021, 12:07 PM ?

## 2021-05-15 NOTE — Progress Notes (Signed)
Pt expressed wishes to stop treatment and "let me die". RN gave therapeutic conversation and notified [place] Crane PA from [facility]. Pt repositioned in bed and given benadryl per MAR to help sleep. Pt able to rest for approximately one hour before waking and asking to call his wife and express his wishes to die. RN informed patient it was 4am and we could call his wife in the morning and she would be in to visit in the morning as well. Pt resting in bed with TV on, call button in reach. ?

## 2021-05-15 NOTE — Evaluation (Signed)
Physical Therapy Evaluation ?Patient Details ?Name: Peter Oneill ?MRN: [id number] ?DOB: 03-28-1940 ?Today's Date: 05/15/2021 ? ?History of Present Illness ? 81 y.o. male with a past medical history of CKD5, HTN, OSA, asbestosis, CAD s/p CABG who presented to [facility] with worsening edema, SOB, volume overload.  ?Clinical Impression ? PTA, pt lives with his spouse and is independent with mobility using a cane and requires assist for lower body ADL's due to chronic back pain. Pt currently presents with generalized weakness, impaired balance, abnormal posture, cognitive impairment, and decreased activity tolerance. Pt requiring min assist (+2 safety) for functional mobility. Ambulating 20 ft with a walker and close chair follow. Will benefit from HHPT at discharge. ?   ? ?Recommendations for follow up therapy are one component of a multi-disciplinary discharge planning process, led by the attending physician.  Recommendations may be updated based on patient status, additional functional criteria and insurance authorization. ? ?Follow Up Recommendations Home health PT ? ?  ?Assistance Recommended at Discharge Frequent or constant Supervision/Assistance  ?Patient can return home with the following ? A little help with walking and/or transfers;A little help with bathing/dressing/bathroom;Assistance with cooking/housework;Direct supervision/assist for medications management;Direct supervision/assist for financial management;Assist for transportation;Help with stairs or ramp for entrance ? ?  ?Equipment Recommendations Wheelchair (measurements PT);Wheelchair cushion (measurements PT)  ?Recommendations for Other Services ?    ?  ?Functional Status Assessment Patient has had a recent decline in their functional status and demonstrates the ability to make significant improvements in function in a reasonable and predictable amount of time.  ? ?  ?Precautions / Restrictions Precautions ?Precautions: Fall ?Restrictions ?Weight Bearing  Restrictions: No ?Other Position/Activity Restrictions: chronic back issues  ? ?  ? ?Mobility ? Bed Mobility ?Overal bed mobility: Needs Assistance ?Bed Mobility: Supine to Sit ?  ?  ?Supine to sit: Min guard, HOB elevated ?  ?  ?General bed mobility comments: no physical assist required, cues for inititation ?  ? ?Transfers ?Overall transfer level: Needs assistance ?Equipment used: Rolling walker (2 wheels) ?Transfers: Sit to/from Stand, Bed to chair/wheelchair/BSC ?Sit to Stand: Min assist ?  ?  ?  ?  ?  ?General transfer comment: Light minA to rise and steady from edge of bed, pt pushing up from walker ?  ? ?Ambulation/Gait ?Ambulation/Gait assistance: Min assist, +2 safety/equipment ?Gait Distance (Feet): 20 Feet ?Assistive device: Rolling walker (2 wheels) ?Gait Pattern/deviations: Step-through pattern, Decreased stride length, Trunk flexed ?Gait velocity: decreased ?Gait velocity interpretation: <1.8 ft/sec, indicate of risk for recurrent falls ?  ?General Gait Details: Significantly increased trunk/hip flexion, which pt states is baseline. fatigues easily, cues for keeping hands on handles of walker ? ?Stairs ?  ?  ?  ?  ?  ? ?Wheelchair Mobility ?  ? ?Modified Rankin (Stroke Patients Only) ?  ? ?  ? ?Balance Overall balance assessment: Needs assistance ?Sitting-balance support: Feet supported, Bilateral upper extremity supported ?Sitting balance-Leahy Scale: Fair ?  ?  ?Standing balance support: Reliant on assistive device for balance ?Standing balance-Leahy Scale: Poor ?  ?  ?  ?  ?  ?  ?  ?  ?  ?  ?  ?  ?   ? ? ? ?Pertinent Vitals/Pain Pain Assessment ?Pain Assessment: No/denies pain  ? ? ?Home Living Family/patient expects to be discharged to:: Private residence ?Living Arrangements: Spouse/significant other ?Available Help at Discharge: Family;Available 24 hours/day ?Type of Home: House ?Home Access: Stairs to enter ?Entrance Stairs-Rails: Left ?Entrance Stairs-Number of Steps: 2 ?  ?  Home Layout: One  level ?Home Equipment: Conservation officer, nature (2 wheels);Hand held shower head;Grab bars - tub/shower;Shower seat ?   ?  ?Prior Function Prior Level of Function : Independent/Modified Independent ?  ?  ?  ?  ?  ?  ?  ?ADLs Comments: Patient states continues to drive, manage meds, helps with meal prep and home management.  Completes his own ADL with assist for LB dressing from spouse. ?  ? ? ?Hand Dominance  ? Dominant Hand: Right ? ?  ?Extremity/Trunk Assessment  ? Upper Extremity Assessment ?Upper Extremity Assessment: Defer to OT evaluation ?  ? ?Lower Extremity Assessment ?Lower Extremity Assessment: Generalized weakness ?  ? ?Cervical / Trunk Assessment ?Cervical / Trunk Assessment: Kyphotic  ?Communication  ? Communication: No difficulties  ?Cognition Arousal/Alertness: Lethargic ?Behavior During Therapy: Flat affect ?Overall Cognitive Status: Impaired/Different from baseline ?Area of Impairment: Orientation, Attention, Memory, Following commands, Awareness, Problem solving ?  ?  ?  ?  ?  ?  ?  ?  ?Orientation Level: Disoriented to, Place, Time ?Current Attention Level: Selective ?Memory: Decreased short-term memory ?Following Commands: Follows one step commands with increased time ?  ?Awareness: Emergent ?Problem Solving: Slow processing, Requires verbal cues ?  ?  ?  ? ?  ?General Comments   ? ?  ?Exercises    ? ?Assessment/Plan  ?  ?PT Assessment Patient needs continued PT services  ?PT Problem List Decreased strength;Decreased activity tolerance;Decreased mobility;Decreased balance;Decreased cognition ? ?   ?  ?PT Treatment Interventions DME instruction;Gait training;Functional mobility training;Therapeutic activities;Therapeutic exercise;Balance training;Patient/family education;Stair training   ? ?PT Goals (Current goals can be found in the Care Plan section)  ?Acute Rehab PT Goals ?Patient Stated Goal: go home ?PT Goal Formulation: With patient/family ?Time For Goal Achievement: 05/29/21 ?Potential to Achieve  Goals: Good ? ?  ?Frequency Min 3X/week ?  ? ? ?Co-evaluation   ?Reason for Co-Treatment: Complexity of the patient's impairments (multi-system involvement);For patient/therapist safety ?  ?OT goals addressed during session: ADL's and self-care ?  ? ? ?  ?AM-PAC PT "6 Clicks" Mobility  ?Outcome Measure Help needed turning from your back to your side while in a flat bed without using bedrails?: A Little ?Help needed moving from lying on your back to sitting on the side of a flat bed without using bedrails?: A Little ?Help needed moving to and from a bed to a chair (including a wheelchair)?: A Little ?Help needed standing up from a chair using your arms (e.g., wheelchair or bedside chair)?: A Little ?Help needed to walk in hospital room?: A Little ?Help needed climbing 3-5 steps with a railing? : A Lot ?6 Click Score: 17 ? ?  ?End of Session   ?Activity Tolerance: Patient tolerated treatment well ?Patient left: in chair;with call bell/phone within reach;with family/visitor present ?Nurse Communication: Mobility status ?PT Visit Diagnosis: Unsteadiness on feet (R26.81);Muscle weakness (generalized) (M62.81);Difficulty in walking, not elsewhere classified (R26.2) ?  ? ?Time: 5625-6389 ?PT Time Calculation (min) (ACUTE ONLY): 26 min ? ? ?Charges:   PT Evaluation ?$PT Eval Moderate Complexity: 1 Mod ?  ?  ?   ? ? ?Wyona Almas, PT, DPT ?Acute Rehabilitation Services ?Pager [phone] ?Office [phone] ? ? ?Peter Oneill ?05/15/2021, 2:25 PM ? ?

## 2021-05-15 NOTE — Progress Notes (Signed)
?[facility] ?Progress Note  ? ? ?Assessment/ Plan:   ?CKD5, now ESRD ?-follows w/ Dr. Justin Mend as an outpatient ?-started HD 3/20. HD#2 now, HD#3 planned for later today (slow start protocol). Anticipating that his 4th treatment will be on Friday ?-CLIP-referral made to [facility] [place] ?-Continue to monitor daily Cr, Dose meds for GFR<15 ?-Maintain MAP>65 for optimal renal perfusion.  ?-Avoid nephrotoxic medications including NSAIDs and iodinated intravenous contrast exposure unless the latter is absolutely indicated.  Preferred narcotic agents for pain control are hydromorphone, fentanyl, and methadone. Morphine should not be used. Avoid Baclofen and avoid oral sodium phosphate and magnesium citrate based laxatives / bowel preps. Continue strict Input and Output monitoring. Will monitor the patient closely with you and intervene or adjust therapy as indicated by changes in clinical status/labs  ?  ?Hypervolemia, acute on chronic dCHF exacerbation, ESRD ?-failed outpatient diuresis ?-UF as tolerated on HD ?-daily weights--weight inaccurate today ?  ?Bradycardia ?-appreciate recommendations from cardiology ?-on dopamine gtt ?  ?Hyperkalemia ?-improved ?  ?AGMA 2/2 ESRD ?-improving w/ HD ?  ?Anemia due to chronic kidney disease: ?-Transfuse for Hgb<7 g/dL ?-Hgb 11 ?-will give iron load for a total of 1g w/ HD ?  ?Dialysis access ?-s/p Left BC AVF placed 04/23/21--maturing ?-s/p RIJ [facility] 05/13/21 ([facility]) ? ?Subjective:   ?Patient seen and examined in ICU. Had issues w/ 'depression' overnight. Wanted to stop all therapies and wanted to 'give up'. Wife and daughter at bedside. Had a lengthy discussion about this. Will push HD to later today so he can get some rest. Otherwise tolerated HD yesterday with net UF 3L  ? ?Objective:   ?BP (!) 150/60 (BP Location: Right Arm)   Pulse 70   Temp (!) 97.3 ?F (36.3 ?C) (Oral)   Resp 16   Ht 5\' 9"  (1.753 m)   Wt 107.3 kg   SpO2 95%   BMI 34.93 kg/m?  ? ?Intake/Output Summary  (Last 24 hours) at 05/15/2021 0910 ?Last data filed at 05/15/2021 0800 ?Gross per 24 hour  ?Intake 544.75 ml  ?Output 3000 ml  ?Net -2455.25 ml  ? ?Weight change: -12.5 kg ? ?Physical Exam: ?Gen:nad ?QPR:[id number] ?Resp:diminished air entry bibasilar, normal wob ?JTT:[id number] but softer today ?Ext:2+ pitting edema b/l LE's ?Neuro: awake, alert ?Dialysis access: RIJ TDC (in use), LUE AVF +thrill (maturing) ? ?Imaging: ?DG Chest 2 View ? ?Result Date: 05/13/2021 ?CLINICAL DATA:  Shortness of breath EXAM: CHEST - 2 VIEW COMPARISON:  Previous studies including the examination of 04/10/2020 FINDINGS: Transverse diameter of heart is increased. Central pulmonary vessels are prominent. Are scattered pleural calcifications. There are patchy infiltrates in right parahilar region and both lower lung fields. There is blunting CP lateral CP angles. There is no pneumothorax. Tip right IJ dialysis catheter is seen in the superior vena cava. Metallic sutures seen in the sternum. IMPRESSION: There are patchy infiltrates in the right mid and both lower lung fields suggesting atelectasis/pneumonia. Small bilateral pleural effusions, more so on the left side. There scattered linear densities and pleural calcifications in both lungs suggesting scarring. Electronically Signed   By: Elmer Picker M.D.   On: 05/13/2021 10:06  ? ?ECHOCARDIOGRAM COMPLETE ? ?Result Date: 05/13/2021 ?   ECHOCARDIOGRAM REPORT   Patient Name:   Peter [facility]ill Date of Exam: 05/13/2021 Medical Rec #:  [id number]        Height:       69.0 in Accession #:    [id number]       Weight:  240.0 lb Date of Birth:  1941/01/22         BSA:          2.232 m? Patient Age:    81 years         BP:           131/40 mmHg Patient Gender: M                HR:           56 bpm. Exam Location:  Inpatient Procedure: 2D Echo, Cardiac Doppler, Color Doppler and Intracardiac            Opacification Agent Indications:    Complete heart block  History:        Patient has prior  history of Echocardiogram examinations, most                 recent 04/11/2020. CAD, Prior CABG; Risk Factors:Hypertension,                 Dyslipidemia and Sleep Apnea.  Sonographer:    Merrie Roof RDCS Referring Phys: [id number] [facility] D HARRIS  Sonographer Comments: Technically difficult study due to poor echo windows. IMPRESSIONS  1. Left ventricular ejection fraction, by estimation, is 55 to 60%. The left ventricle has normal function. The left ventricle has no regional wall motion abnormalities. Left ventricular diastolic parameters are indeterminate.  2. Right ventricular systolic function is mildly reduced. The right ventricular size is mildly enlarged.  3. Left atrial size was mildly dilated.  4. The mitral valve is abnormal. Trivial mitral valve regurgitation. No evidence of mitral stenosis.  5. The aortic valve is tricuspid. There is mild calcification of the aortic valve. There is mild thickening of the aortic valve. Aortic valve regurgitation is trivial. Aortic valve sclerosis is present, with no evidence of aortic valve stenosis.  6. The inferior vena cava is normal in size with greater than 50% respiratory variability, suggesting right atrial pressure of 3 mmHg. FINDINGS  Left Ventricle: Left ventricular ejection fraction, by estimation, is 55 to 60%. The left ventricle has normal function. The left ventricle has no regional wall motion abnormalities. The left ventricular internal cavity size was normal in size. There is  no left ventricular hypertrophy. Left ventricular diastolic parameters are indeterminate. Right Ventricle: The right ventricular size is mildly enlarged. Right vetricular wall thickness was not assessed. Right ventricular systolic function is mildly reduced. Left Atrium: Left atrial size was mildly dilated. Right Atrium: Right atrial size was normal in size. Pericardium: There is no evidence of pericardial effusion. Mitral Valve: The mitral valve is abnormal. Mild mitral annular  calcification. Trivial mitral valve regurgitation. No evidence of mitral valve stenosis. Tricuspid Valve: The tricuspid valve is normal in structure. Tricuspid valve regurgitation is mild . No evidence of tricuspid stenosis. Aortic Valve: The aortic valve is tricuspid. There is mild calcification of the aortic valve. There is mild thickening of the aortic valve. Aortic valve regurgitation is trivial. Aortic valve sclerosis is present, with no evidence of aortic valve stenosis. Pulmonic Valve: The pulmonic valve was normal in structure. Pulmonic valve regurgitation is trivial. No evidence of pulmonic stenosis. Aorta: The aortic root is normal in size and structure. Venous: The inferior vena cava is normal in size with greater than 50% respiratory variability, suggesting right atrial pressure of 3 mmHg. IAS/Shunts: No atrial level shunt detected by color flow Doppler.  LEFT VENTRICLE PLAX 2D LVIDd:         4.00  cm   Diastology LVIDs:         3.00 cm   LV e' medial:    6.64 cm/s LV PW:         1.10 cm   LV E/e' medial:  19.9 LV IVS:        1.10 cm   LV e' lateral:   10.00 cm/s LVOT diam:     2.00 cm   LV E/e' lateral: 13.2 LV SV:         113 LV SV Index:   51 LVOT Area:     3.14 cm?  RIGHT VENTRICLE RV S prime:     7.88 cm/s TAPSE (M-mode): 1.6 cm LEFT ATRIUM             Index        RIGHT ATRIUM           Index LA diam:        4.00 cm 1.79 cm/m?   RA Area:     28.00 cm? LA Vol (A2C):   75.2 ml 33.68 ml/m?  RA Volume:   106.00 ml 47.48 ml/m? LA Vol (A4C):   97.8 ml 43.81 ml/m? LA Biplane Vol: 94.7 ml 42.42 ml/m?  AORTIC VALVE LVOT Vmax:   180.00 cm/s LVOT Vmean:  99.400 cm/s LVOT VTI:    0.359 m  AORTA Ao Root diam: 3.10 cm Ao Asc diam:  3.40 cm MITRAL VALVE MV Area (PHT): 2.31 cm?     SHUNTS MV Decel Time: 329 msec     Systemic VTI:  0.36 m MV E velocity: 132.00 cm/s  Systemic Diam: 2.00 cm MV A velocity: 100.00 cm/s MV E/A ratio:  1.32 Jenkins Rouge MD Electronically signed by Jenkins Rouge MD Signature Date/Time:  05/13/2021/4:11:06 PM    Final    ? ?Labs: ?BMET ?Recent Labs  ?Lab 05/13/21 ?7824 05/13/21 ?1040 05/13/21 ?1042 05/13/21 ?1203 05/13/21 ?1210 05/14/21 ?0119 05/15/21 ?0750  ?NA 139 134* 136 139  --  136 136  ?K 5.2* 4.9 5.4*

## 2021-05-15 NOTE — Progress Notes (Signed)
Contacted [facility] admissions to f/u on pt's referral. Awaiting a return call.  ? ?Peter [facility] ?Renal Navigator ?[phone] ?

## 2021-05-16 ENCOUNTER — Inpatient Hospital Stay (HOSPITAL_COMMUNITY): Payer: Medicare HMO

## 2021-05-16 DIAGNOSIS — I459 Conduction disorder, unspecified: Secondary | ICD-10-CM | POA: Diagnosis not present

## 2021-05-16 DIAGNOSIS — Z66 Do not resuscitate: Secondary | ICD-10-CM

## 2021-05-16 DIAGNOSIS — Z515 Encounter for palliative care: Secondary | ICD-10-CM

## 2021-05-16 DIAGNOSIS — E8770 Fluid overload, unspecified: Secondary | ICD-10-CM

## 2021-05-16 DIAGNOSIS — R001 Bradycardia, unspecified: Secondary | ICD-10-CM | POA: Diagnosis not present

## 2021-05-16 LAB — PTH, INTACT AND CALCIUM
Calcium, Total (PTH): 8.2 mg/dL — ABNORMAL LOW (ref 8.6–10.2)
PTH: 380 pg/mL — ABNORMAL HIGH (ref 15–65)

## 2021-05-16 LAB — RENAL FUNCTION PANEL
Albumin: 2.8 g/dL — ABNORMAL LOW (ref 3.5–5.0)
Anion gap: 13 (ref 5–15)
BUN: 69 mg/dL — ABNORMAL HIGH (ref 8–23)
CO2: 21 mmol/L — ABNORMAL LOW (ref 22–32)
Calcium: 8.2 mg/dL — ABNORMAL LOW (ref 8.9–10.3)
Chloride: 101 mmol/L (ref 98–111)
Creatinine, Ser: 5.7 mg/dL — ABNORMAL HIGH (ref 0.61–1.24)
GFR, Estimated: 9 mL/min — ABNORMAL LOW (ref >60)
Glucose, Bld: 102 mg/dL — ABNORMAL HIGH (ref 70–99)
Phosphorus: 6.9 mg/dL — ABNORMAL HIGH (ref 2.5–4.6)
Potassium: 3.9 mmol/L (ref 3.5–5.1)
Sodium: 135 mmol/L (ref 135–145)

## 2021-05-16 LAB — CBC
HCT: 29.4 % — ABNORMAL LOW (ref 39.0–52.0)
Hemoglobin: 10 g/dL — ABNORMAL LOW (ref 13.0–17.0)
MCH: 31.3 pg (ref 26.0–34.0)
MCHC: 34 g/dL (ref 30.0–36.0)
MCV: 92.2 fL (ref 80.0–100.0)
Platelets: 40 10*3/uL — ABNORMAL LOW (ref 150–400)
RBC: 3.19 MIL/uL — ABNORMAL LOW (ref 4.22–5.81)
RDW: 16.5 % — ABNORMAL HIGH (ref 11.5–15.5)
WBC: 6 10*3/uL (ref 4.0–10.5)
nRBC: 1 % — ABNORMAL HIGH (ref 0.0–0.2)

## 2021-05-16 MED ORDER — BISACODYL 10 MG RE SUPP: 10.0000 mg | Freq: Every day | RECTAL | Status: DC

## 2021-05-16 MED ORDER — "THROMBI-PAD 3""X3"" EX PADS"
1.0000 | MEDICATED_PAD | Freq: Once | CUTANEOUS | Status: AC
  Administered 2021-05-16: 1 via TOPICAL
  Filled 2021-05-16: qty 1

## 2021-05-16 MED ORDER — POLYETHYLENE GLYCOL 3350 17 G PO PACK
17.0000 g | PACK | Freq: Every day | ORAL | Status: DC
  Administered 2021-05-16: 17 g via ORAL
  Filled 2021-05-16 (×2): qty 1

## 2021-05-16 MED ORDER — HYDROCORTISONE (PERIANAL) 2.5 % EX CREA
TOPICAL_CREAM | Freq: Three times a day (TID) | CUTANEOUS | Status: DC
  Administered 2021-05-16: 1 via RECTAL
  Filled 2021-05-16: qty 28.35

## 2021-05-16 NOTE — Progress Notes (Signed)
MD Tamala Julian notified of patient's heart rate decreasing down to 33 beats per minute and staying at 35-42 BPM while patient sleeping.   Patient's blood pressure stable. ?  ?MD Tamala Julian advised if patient remains asymptomatic, RN to continue to monitor patient.  If patient becomes symptomatic, MD Tamala Julian advised RN to restart dopamine drip.  ?

## 2021-05-16 NOTE — Plan of Care (Signed)

## 2021-05-16 NOTE — Consult Note (Addendum)
? ?                                                                                ?Consultation Note ?Date: 05/16/2021  ? ?Patient Name: Peter Oneill  ?DOB: Dec 28, 1940  MRN: [id number]  Age / Sex: 81 y.o., male  ?PCP: [name], MD ?Referring Physician: Candee Furbish, MD ? ?Reason for Consultation: Establishing goals of care ? ?HPI/Patient Profile: 81 y.o. male  with past medical history CKD5, HTN, OSA, asbestosis, CAD s/p CABG who presents to [facility] with worsening edema, SOB, volume overload. He is s/p RIJ [facility] placement with Dr. Augustin Coupe as an outpatient but was sent directly here after catheter placement for dyspnea. Pt was on lasix 149m BID w/ metolazone 130mdaily as an outpatient. On 3/20, he was bradycardic with HR in the 20's, which responded to atropine. ? ?He completed HD on 3/20 and 3/22 with plans for HD tomorrow 3/24. ? ?Clinical Assessment and Goals of Care: ?I have reviewed medical records including EPIC notes, labs and imaging, assessed the patient and then met with patient and his wife at bedside  to discuss diagnosis prognosis, GOC, EOL wishes, disposition and options. ? ?I introduced Palliative Medicine as specialized medical care for people living with serious illness. It focuses on providing relief from the symptoms and stress of a serious illness. The goal is to improve quality of life for both the patient and the family. ? ?Pt is alert, oriented, and has capacity to make his needs known and to make his own medical decisions.  ? ?We discussed a brief life review of the patient. Patient and his wife have been married for 56 years. [place] be closer to their now grown grandchildren. Patient enjoyed salmon and deep sea fishing and bear and deer hunting.  ? ?As far as functional and nutritional status pt was independent with all ADLs PTA.  ? ?We discussed patient's current illness and what it means in the larger context of patient's on-going co-morbidities. I  attempted to elicit values and goals of care important to the patient.  Pt states that if he is not going to get better and have to sit with the "big pillow" on his chest for 4 hours a day then he would rather not do HD. He says he is not one to sit still and just lay in a hospital bed. He says HD is a lot and hard on him and he just doesn't want to do it. ? ?His wife is in disagreement with his stated wishes. She states he is going to get better and he is going to do HD (perhaps even home HD?) so that he will be fine. Therapeutic silence and active listening offered for patient and patient's wife to share their thoughts and emotions regarding patient's current health status. ? ?The difference between aggressive medical intervention and comfort care was considered in light of the patient's goals of care. Advance directives and concepts specific to code status were considered and discussed.  ?Pt states he has a living will that outlines he does not ever want to be put on a ventilator.  ? ?  When asked in the event that his heart and his lungs were to stop and he were to physically pass away then as it stands right now the medical team will call a CODE BLUE, perform CPR, give medications to attempt to restart his heart, and likely place a breathing tube and connect him to a ventilator.  Throughout this explanation patient was shaking his head no.  He said he does not want this.  He says if that happens then he wants [place] to just "let him go". He again states he thought he already made this clear in a living will. Reviewed that changing pt to DNR status would allow a natural death. Pt said this is what he wants. Wife did not disagree. Code status changed to DNR and nurse and attending Dr. Tamala Julian made aware. ? ?Education offered regarding concept specific to human mortality. Discussed that patient has full agency over his decisions and that loved ones/family may disagree with these decisions but they can support and love the  patient through these hard choices.   ?  ?Discussed with patient the importance of continued conversation with his family and the medical providers regarding overall plan of care and treatment options, ensuring decisions are within the context of the patient?s values and GOCs.  ? ?Questions and concerns were addressed. The family was encouraged to call with questions or concerns.  ? ?Primary Decision Maker ?PATIENT ? ?Code Status/Advance Care Planning: ?DNR ? ?Prognosis:   ?Unable to determine ? ?Discharge Planning: To Be Determined ? ?Primary Diagnoses: ?Present on Admission: ? Heart block ? ? ?Physical Exam ?Vitals and nursing note reviewed.  ?Constitutional:   ?   Appearance: Normal appearance. He is obese.  ?HENT:  ?   Head: Normocephalic and atraumatic.  ?   Mouth/Throat:  ?   Mouth: Mucous membranes are moist.  ?Eyes:  ?   Pupils: Pupils are equal, round, and reactive to light.  ?Cardiovascular:  ?   Rate and Rhythm: Normal rate.  ?   Pulses: Normal pulses.  ?Pulmonary:  ?   Effort: Pulmonary effort is normal.  ?Abdominal:  ?   Palpations: Abdomen is soft.  ?Musculoskeletal:  ?   Cervical back: Normal range of motion.  ?   Comments: Generalized weakness  ?Skin: ?   General: Skin is warm.  ?   Comments: Bruising of LUE  ?Neurological:  ?   Mental Status: He is alert and oriented to person, place, and time.  ?Psychiatric:     ?   Mood and Affect: Mood normal.     ?   Behavior: Behavior normal.     ?   Thought Content: Thought content normal.     ?   Judgment: Judgment normal.  ? ? ?Palliative Assessment/Data: 50% ? ? ? ? ?I discussed this patient's plan of care with patient, patient's wife, nurse, Dr. Tamala Julian. ? ?Thank you for this consult. Palliative medicine will continue to follow and assist holistically.  ? ?Time Total: 75 minutes ?Greater than 50%  of this time was spent counseling and coordinating care related to the above assessment and plan. ? ?Signed by: ?Jordan Hawks, DNP, FNP-BC ?Palliative  Medicine ? ?  ?Please contact Palliative Medicine Team phone at [phone] for questions and concerns.  ?For individual provider: See Amion ? ? ? ? ? ? ? ? ? ? ? ? ? c ?

## 2021-05-16 NOTE — Progress Notes (Signed)
?[facility] ?Progress Note  ? ? ?Assessment/ Plan:   ?CKD5, now ESRD ?-follows w/ Dr. Justin Mend as an outpatient ?-started HD 3/20, s/p slow start protocol (#2 on 3/21, #3 on 3/22). Anticipating HD tomorrow ?-CLIP-referral made to [facility] [place] ?-Continue to monitor daily Cr, Dose meds for GFR<15 ?-Maintain MAP>65 for optimal renal perfusion.  ?-Avoid nephrotoxic medications including NSAIDs and iodinated intravenous contrast exposure unless the latter is absolutely indicated.  Preferred narcotic agents for pain control are hydromorphone, fentanyl, and methadone. Morphine should not be used. Avoid Baclofen and avoid oral sodium phosphate and magnesium citrate based laxatives / bowel preps. Continue strict Input and Output monitoring. Will monitor the patient closely with you and intervene or adjust therapy as indicated by changes in clinical status/labs  ?  ?Hypervolemia, acute on chronic dCHF exacerbation, ESRD ?-failed outpatient diuresis ?-UF as tolerated on HD ?  ?Bradycardia ?-appreciate recommendations from cardiology ?-on dopamine gtt ?-EP to be consulted ?  ?Hyperkalemia ?-improved ?  ?AGMA 2/2 ESRD ?-improving w/ HD ?  ?Anemia due to chronic kidney disease: ?-Transfuse for Hgb<7 g/dL ?-Hgb 10 ?-giving iron load for a total of 1g w/ HD ?  ?Dialysis access ?-s/p Left BC AVF placed 04/23/21--maturing ?-s/p RIJ [facility] 05/13/21 ([facility]) ? ?Subjective:   ?Patient seen and examined in ICU. No acute events. He reports that he feels better in regards to swelling and breathing. Tolerated HD#3 yesterday with net UF 3L ?Oozing from RIJ TDC-thrombipad placed, oozing seems to have slowed down.  ? ?Objective:   ?BP (!) 126/46   Pulse (!) 46   Temp 98 ?F (36.7 ?C) (Oral)   Resp 18   Ht 5\' 9"  (1.753 m)   Wt 104 kg   SpO2 96%   BMI 33.86 kg/m?  ? ?Intake/Output Summary (Last 24 hours) at 05/16/2021 1002 ?Last data filed at 05/16/2021 0900 ?Gross per 24 hour  ?Intake 459.05 ml  ?Output 3200 ml  ?Net -2740.95 ml  ? ?Weight  change: 10.6 kg ? ?Physical Exam: ?Gen:nad ?IRJ:[id number] ?Resp:diminished air entry bibasilar, normal wob ?ZSW:FUXNATFTD but softer today ?Ext:1+ pitting edema b/l LE's ?Neuro: awake, alert ?Dialysis access: RIJ TDC (with surrounding ecchymosis), LUE AVF +thrill (maturing) ? ?Imaging: ?No results found. ? ?Labs: ?BMET ?Recent Labs  ?Lab 05/13/21 ?3220 05/13/21 ?1040 05/13/21 ?1042 05/13/21 ?1203 05/13/21 ?1210 05/14/21 ?0119 05/15/21 ?0750 05/16/21 ?0105  ?NA 139 134* 136 139  --  136 136 135  ?K 5.2* 4.9 5.4* 5.0  --  4.6 4.6 3.9  ?CL 104  --  105 104  --  100 99 101  ?CO2 14*  --   --  16*  --  17* 20* 21*  ?GLUCOSE 134*  --  124* 120*  --  113* 99 102*  ?BUN 174*  --  >130* 173*  --  142* 113* 69*  ?CREATININE 8.91*  --  10.10* 9.16* 8.87* 8.09* 7.58* 5.70*  ?CALCIUM 8.1*  --   --  8.1*  --  8.5* 8.4* 8.2*  ?PHOS  --   --   --  >30.0*  --  11.4*  --  6.9*  ? ?CBC ?Recent Labs  ?Lab 05/13/21 ?2542 05/13/21 ?1040 05/13/21 ?1210 05/14/21 ?0119 05/15/21 ?0750 05/16/21 ?0105  ?WBC 5.0  --  4.6 8.0 6.8 6.0  ?NEUTROABS 4.3  --   --   --   --   --   ?HGB 10.1*   < > 9.7* 11.0* 10.7* 10.0*  ?HCT 29.9*   < >  29.3* 32.5* 30.7* 29.4*  ?MCV 93.7  --  94.2 90.8 90.8 92.2  ?PLT 72*  --  69* 72* 54* 40*  ? < > = values in this interval not displayed.  ? ? ?Medications:   ? ? aspirin EC  81 mg Oral Daily  ? bisacodyl  10 mg Rectal Daily  ? Chlorhexidine Gluconate Cloth  6 each Topical Q0600  ? clopidogrel  75 mg Oral Daily  ? ezetimibe  10 mg Oral Daily  ? heparin  5,000 Units Subcutaneous Q8H  ? hydrocortisone   Rectal TID  ? pantoprazole  40 mg Oral Daily  ? polyethylene glycol  17 g Oral Daily  ? rosuvastatin  10 mg Oral Daily  ? sevelamer carbonate  1,600 mg Oral TID WC  ? Thrombi-Pad  1 each Topical Once  ? umeclidinium-vilanterol  1 puff Inhalation Daily  ? ? ? ? ?Gean Quint, MD ?[facility] ?05/16/2021, 10:02 AM  ? ?

## 2021-05-16 NOTE — Progress Notes (Signed)
Pt has been accepted at [facility] on TTS schedule. Pt can start at clinic on Tuesday, March 28. Pt will need to arrive at 11:00 for first appt to complete paperwork prior to 12:00 chair time. Met with pt and pt's wife at bedside. Discussed arrangements and provided schedule letter. Both agreeable to the plan. Update provided to attending, nephrologist, and [facility] staff. Will assist as needed.  ? ?Melven Sartorius ?Renal Navigator ?[phone] ?

## 2021-05-16 NOTE — Consult Note (Signed)
? ?ELECTROPHYSIOLOGY CONSULT NOTE  ? ? ?Patient ID: Peter Oneill ?MRN: [id number], DOB/AGE: [birth year] 81 y.o. ? ?Admit date: 05/13/2021 ?Date of Consult: 05/17/2021  ? ?Primary Physician: [name], MD ?Primary Cardiologist: Larae Grooms, MD  ?Electrophysiologist:  New  to Dr. Quentin Ore ? ?Referring Provider: Dr. Tamala Julian ? ?Patient Profile: ?Peter Oneill is a 81 y.o. male with a history of extensive CAD s/p CABG in 2003 with subsequent PCI in 2004, repeat CABG x2 (RIMA to LAD and SVG to Diag) in 01/2015, and most recently PCI in 11/2015 in [place], [place]. Also has a history of chronic diastolic CHF, bilateral carotid artery disease s/p prior CEA, asbestosis, hypertension, hyperlipidemia, ESRD on, and GERD who is being seen 05/13/2021 for the evaluation of bradycardia at the request of Dr. Dina Rich. who is being seen today for the evaluation of bradycardia at the request of Dr. Tamala Julian. ? ?HPI:  ?Peter Oneill is a 81 y.o. male with medical history as above.  ? ?Pt followed by Dr. Irish Lack.  ? ?Pt had AV fistula placed in left arm in February which is stil maturing.  He was scheduled to start HD 3/20 but was sent to urgently start in the ED in the setting of marked volume overload.  On arrival to ED, noted to bradycardia into 20-30s requiring atropine. Felt to have as many as 40 lbs of volume on board.  ? ?EKG shows an irregular rhythm with a rate of 32 bpm.  It is difficult to interpret the underlying rhythm as P waves are difficult to see.  Possible atrial fibrillation vs possible high-grade AV block vs junctional rhythm. Initial high-sensitivity troponin 44. BNP elevated at 1,173. Chest x-ray showed patchy infiltrates in the right mid and both lower lung field suggesting atelectasis/pneumonia as well as small bilateral pleural effusions (left > right). WBC 5.0, Hgb 10.1, Plts 72. Na 139, K 5.2, Glucose 134, BUN 174, Cr 8.91. Albumin 2.9, AST 25, ALT 14, Alk Phos 76, Total Bili 0.7. Anion gap 21. Mag 3.8.  Given significant bradycardia and hypotension, patient was given 0.41m of Atropine with improvement in BP and improvement in heart rates to the 40s. Ultimately has been managed on dopamine.   ? ?Weaned off. 3/23 his HRs remained in 40s with occasional dips into the 30s, asymptomatic.  ? ?He has dialyzed throughout the stay and is down approx 12 lbs. ? ?EP asked to see to consider pacing given his on-going bradycardia.  ? ?This am, he is feeling well and tolerating dialysis with HRs maintaining in 50s mostly, occasionally dipping into upper 40s, but lower 60s as well. His wife is not present, and he states she helps with any decision making. He denies any ongoing chest pressure. No dizziness or lightheadedness currently.   He wants to avoid any more "poking and prodding" than necessary.  ? ?Past Medical History:  ?Diagnosis Date  ? Asbestosis ([place]   ? Contraindication to percutaneous coronary intervention (PCI)   ? Coronary artery disease   ? Dyslipidemia   ? Dyspnea   ? History of anemia   ? Hypercholesterolemia   ? Hypertension   ? Ileus ([place]   ? Pleural effusion on left   ? Pneumonia   ? Renal failure   ? Sleep apnea   ?  ? ?Surgical History:  ?Past Surgical History:  ?Procedure Laterality Date  ? AV FISTULA PLACEMENT Left 04/23/2021  ? Procedure: LEFT ARM  FISTULA CREATION;  Surgeon: RBroadus John MD;  [facility]: [facility]  Service:  Vascular;  Laterality: Left;  PERIPHERAL NERVE BLOCK  ? BACK SURGERY    ? CARDIAC CATHETERIZATION    ? CAROTID ENDARTERECTOMY    ? CARPAL TUNNEL RELEASE    ? CORONARY ARTERY BYPASS GRAFT    ? HERNIA REPAIR    ? PERCUTANEOUS CORONARY STENT INTERVENTION (PCI-S)    ? multiple  ? STERNOTOMY    ?  ? ?Medications Prior to Admission  ?Medication Sig Dispense Refill Last Dose  ? allopurinol (ZYLOPRIM) 100 MG tablet Take 100 mg by mouth daily.   05/12/2021  ? amLODipine (NORVASC) 10 MG tablet TAKE 1 TABLET BY MOUTH  DAILY (Patient taking differently: Take 10 mg by mouth daily.) 90 tablet 3  05/13/2021  ? ANORO ELLIPTA 62.5-25 MCG/ACT AEPB Inhale 1 puff into the lungs daily. USE 1 INHALATION ONCE DAILY (Patient taking differently: Inhale 1 puff into the lungs daily.) 60 each 5 05/13/2021  ? aspirin EC 81 MG tablet Take 81 mg by mouth daily.   05/13/2021  ? carvedilol (COREG) 3.125 MG tablet TAKE 1 TABLET BY MOUTH  TWICE DAILY (Patient taking differently: Take 3.125 mg by mouth 2 (two) times daily with a meal.) 180 tablet 3 05/13/2021 at 0830  ? Cholecalciferol (VITAMIN D3 PO) Take 1 tablet by mouth daily.   05/12/2021  ? clopidogrel (PLAVIX) 75 MG tablet Take 75 mg by mouth daily.   05/09/2021  ? ezetimibe (ZETIA) 10 MG tablet Take 10 mg by mouth in the morning.   05/12/2021  ? furosemide (LASIX) 80 MG tablet Take 1 tablet (80 mg total) by mouth 2 (two) times daily. 60 tablet 0 05/12/2021  ? HYDROcodone-acetaminophen (NORCO) 5-325 MG tablet Take 1 tablet by mouth every 6 (six) hours as needed for moderate pain. 10 tablet 0 Past Month  ? hydrocortisone (ANUSOL-HC) 25 MG suppository Place 25 mg rectally daily as needed for hemorrhoids or anal itching.   Past Month  ? latanoprost (XALATAN) 0.005 % ophthalmic solution Place 1 drop into both eyes at bedtime.   05/12/2021  ? Multiple Vitamin (MULTIVITAMIN) tablet Take 1 tablet by mouth daily.   05/12/2021  ? nitroGLYCERIN (NITROSTAT) 0.4 MG SL tablet Place 1 tablet (0.4 mg total) under the tongue every 5 (five) minutes as needed for chest pain. 25 tablet 3 unknown  ? Omega-3 Fatty Acids (FISH OIL PO) Take 2,000 mg by mouth daily.   05/12/2021  ? rosuvastatin (CRESTOR) 10 MG tablet Take 10 mg by mouth daily.   05/12/2021  ? silodosin (RAPAFLO) 4 MG CAPS capsule Take 4 mg by mouth daily with breakfast.   05/12/2021  ? ? ?Inpatient Medications:  ? aspirin EC  81 mg Oral Daily  ? bisacodyl  10 mg Rectal Daily  ? Chlorhexidine Gluconate Cloth  6 each Topical Q0600  ? clopidogrel  75 mg Oral Daily  ? ezetimibe  10 mg Oral Daily  ? hydrocortisone   Rectal TID  ? pantoprazole  40  mg Oral Daily  ? polyethylene glycol  17 g Oral Daily  ? rosuvastatin  10 mg Oral Daily  ? sevelamer carbonate  1,600 mg Oral TID WC  ? umeclidinium-vilanterol  1 puff Inhalation Daily  ? ? ?Allergies:  ?Allergies  ?Allergen Reactions  ? Atorvastatin Other (See Comments)  ?  Muscle aches  ? ? ?Social History  ? ?Socioeconomic History  ? Marital status: Married  ?  Spouse name: Not on file  ? Number of children: Not on file  ? Years of education: Not  on file  ? Highest education level: Not on file  ?Occupational History  ? Not on file  ?Tobacco Use  ? Smoking status: Former  ?  Packs/day: 0.25  ?  Years: 19.00  ?  Pack years: 4.75  ?  Types: Cigarettes  ?  Quit date: 12/10/1976  ?  Years since quitting: 44.4  ? Smokeless tobacco: Never  ?Vaping Use  ? Vaping Use: Never used  ?Substance and Sexual Activity  ? Alcohol use: Yes  ?  Comment: occasional  ? Drug use: No  ? Sexual activity: Yes  ?  Comment: married  ?Other Topics Concern  ? Not on file  ?Social History Narrative  ? Not on file  ? ?Social Determinants of Health  ? ?Financial Resource Strain: Not on file  ?Food Insecurity: Not on file  ?Transportation Needs: Not on file  ?Physical Activity: Not on file  ?Stress: Not on file  ?Social Connections: Not on file  ?Intimate Partner Violence: Not on file  ?  ? ?Family History  ?Problem Relation Age of Onset  ? Heart disease Father   ?  ? ?Review of Systems: ?All other systems reviewed and are otherwise negative except as noted above. ? ?Physical Exam: ?Vitals:  ? 05/17/21 0600 05/17/21 0700 05/17/21 0734 05/17/21 0800  ?BP: (!) 142/60 (!) 132/57  (!) 126/56  ?Pulse: (!) 49 (!) 50  (!) 53  ?Resp: _0 ?Temp:  (!) 97.4 ?F (36.3 ?C) 98.3 ?F (36.8 ?C)   ?TempSrc:  Oral Oral   ?SpO2: 96% 94%  95%  ?Weight:  103.5 kg    ?Height:      ? ? ?GEN- The patient is well appearing, alert and oriented x 3 today.   ?HEENT: normocephalic, atraumatic; sclera clear, conjunctiva pink; hearing intact; oropharynx clear; neck  supple ?Lungs- Clear to ausculation bilaterally, normal work of breathing.  No wheezes, rales, rhonchi ?Heart- Regular rate and rhythm, no murmurs, rubs or gallops ?GI- soft, non-tender, non-distended, bowel so

## 2021-05-16 NOTE — Progress Notes (Addendum)
Physical Therapy Treatment ?Patient Details ?Name: Peter Oneill ?MRN: [id number] ?DOB: 10-28-1940 ?Today's Date: 05/16/2021 ? ? ?History of Present Illness 81 y.o. male who presented 05/13/21 to [facility] with worsening edema, SOB, volume overload. PMH: CKD5, HTN, OSA, asbestosis, CAD s/p CABG ? ?  ?PT Comments  ? ? Pt was able to progress to ambulating x2 bouts of ~20-26 ft each with a RW and min guard assist before fatiguing, likely primarily due to his chronic back pain. Pt also was able to perform all bed mobility, transfers, and ascend forwards and descend backwards a stair multiple times with bil hands on RW over top of the step for stair training simulation all with only min guard assist for safety. Educated pt and family to guard pt with mobility and provide HHA on side without rails on stairs at home. Will continue to follow acutely. Current recommendations remain appropriate. ? ?*HR remains low in 30-40s at rest and 50s with mobility ?   ?Recommendations for follow up therapy are one component of a multi-disciplinary discharge planning process, led by the attending physician.  Recommendations may be updated based on patient status, additional functional criteria and insurance authorization. ? ?Follow Up Recommendations ? Home health PT ?  ?  ?Assistance Recommended at Discharge Frequent or constant Supervision/Assistance  ?Patient can return home with the following A little help with walking and/or transfers;A little help with bathing/dressing/bathroom;Assistance with cooking/housework;Direct supervision/assist for medications management;Direct supervision/assist for financial management;Assist for transportation;Help with stairs or ramp for entrance ?  ?Equipment Recommendations ? Wheelchair (measurements PT);Wheelchair cushion (measurements PT)  ?  ?Recommendations for Other Services   ? ? ?  ?Precautions / Restrictions Precautions ?Precautions: Fall ?Precaution Comments: chronic back pain; watch HR  (low) ?Restrictions ?Weight Bearing Restrictions: No ?Other Position/Activity Restrictions: chronic back issues  ?  ? ?Mobility ? Bed Mobility ?Overal bed mobility: Needs Assistance ?Bed Mobility: Supine to Sit ?  ?  ?Supine to sit: Min guard, HOB elevated ?  ?  ?General bed mobility comments: Pt using bed rails, HOB elevated, no need for physical assistance, min guard for safety. ?  ? ?Transfers ?Overall transfer level: Needs assistance ?Equipment used: Rolling walker (2 wheels) ?Transfers: Sit to/from Stand, Bed to chair/wheelchair/BSC ?Sit to Stand: Min guard ?  ?  ?  ?  ?  ?General transfer comment: Pt needing reminders to push up from surface and reach back for surface with sit <> stand transfers, min guard for safety and extra time to power up to stand. ?  ? ?Ambulation/Gait ?Ambulation/Gait assistance: Min guard, +2 safety/equipment ?Gait Distance (Feet): 26 Feet (x2 bouts of ~20 ft > ~26 ft) ?Assistive device: Rolling walker (2 wheels) ?Gait Pattern/deviations: Step-through pattern, Decreased stride length, Trunk flexed ?Gait velocity: decreased ?Gait velocity interpretation: <1.8 ft/sec, indicate of risk for recurrent falls ?  ?General Gait Details: Significantly increased trunk/hip flexion, which appears to be baseline with chronic back pain. fatigues easily, cues to clear feet, no LOB, min guard for safety with chair follow from pt's wife. Needs cues to keep RW proximal, especially with turning. ? ? ?Stairs ?Stairs: Yes ?Stairs assistance: Min guard ?Stair Management: Two rails, Step to pattern, Forwards, Backwards, With walker ?Number of Stairs: 8 ?General stair comments: Ascended forwards and descended backwards, alterning which foot he lead with, with bil UEs on RW over top of step stool to simulate stairs with bil rails. No LOB, min guard for safety. ? ? ?Wheelchair Mobility ?  ? ?Modified Rankin (Stroke Patients Only) ?  ? ? ?  ?  Balance Overall balance assessment: Needs assistance ?Sitting-balance  support: Bilateral upper extremity supported, No upper extremity supported ?Sitting balance-Leahy Scale: Fair ?  ?  ?Standing balance support: Reliant on assistive device for balance, Bilateral upper extremity supported, During functional activity ?Standing balance-Leahy Scale: Poor ?Standing balance comment: Reliant on RW ?  ?  ?  ?  ?  ?  ?  ?  ?  ?  ?  ?  ? ?  ?Cognition Arousal/Alertness: Awake/alert ?Behavior During Therapy: Flat affect ?Overall Cognitive Status: Impaired/Different from baseline ?Area of Impairment: Attention, Memory, Following commands, Awareness, Problem solving ?  ?  ?  ?  ?  ?  ?  ?  ?  ?Current Attention Level: Selective ?Memory: Decreased short-term memory ?Following Commands: Follows one step commands with increased time ?  ?Awareness: Emergent ?Problem Solving: Slow processing, Requires verbal cues ?General Comments: Pt with flat affect and need for increased time to respond to cues. Needs reminders for hand placement with transfers. ?  ?  ? ?  ?Exercises   ? ?  ?General Comments General comments (skin integrity, edema, etc.): HR 30-40s at rest, 50s with mobility; educated wife to have HHA on stairs and to guard pt with mobility ?  ?  ? ?Pertinent Vitals/Pain Pain Assessment ?Pain Assessment: Faces ?Faces Pain Scale: Hurts little more ?Pain Location: back (chronic) ?Pain Descriptors / Indicators: Discomfort, Grimacing, Guarding ?Pain Intervention(s): Limited activity within patient's tolerance, Monitored during session, Repositioned  ? ? ?Home Living   ?  ?  ?  ?  ?  ?  ?  ?  ?  ?   ?  ?Prior Function    ?  ?  ?   ? ?PT Goals (current goals can now be found in the care plan section) Acute Rehab PT Goals ?Patient Stated Goal: to go home ?PT Goal Formulation: With patient/family ?Time For Goal Achievement: 05/29/21 ?Potential to Achieve Goals: Good ?Progress towards PT goals: Progressing toward goals ? ?  ?Frequency ? ? ? Min 3X/week ? ? ? ?  ?PT Plan Current plan remains appropriate   ? ? ?Co-evaluation   ?  ?  ?  ?  ? ?  ?AM-PAC PT "6 Clicks" Mobility   ?Outcome Measure ? Help needed turning from your back to your side while in a flat bed without using bedrails?: A Little ?Help needed moving from lying on your back to sitting on the side of a flat bed without using bedrails?: A Little ?Help needed moving to and from a bed to a chair (including a wheelchair)?: A Little ?Help needed standing up from a chair using your arms (e.g., wheelchair or bedside chair)?: A Little ?Help needed to walk in hospital room?: A Little ?Help needed climbing 3-5 steps with a railing? : A Little ?6 Click Score: 18 ? ?  ?End of Session Equipment Utilized During Treatment: Gait belt ?Activity Tolerance: Patient tolerated treatment well ?Patient left: in chair;with call bell/phone within reach;with family/visitor present ?Nurse Communication: Mobility status;Other (comment) (HR) ?PT Visit Diagnosis: Unsteadiness on feet (R26.81);Muscle weakness (generalized) (M62.81);Difficulty in walking, not elsewhere classified (R26.2);Other abnormalities of gait and mobility (R26.89) ?  ? ? ?Time: 1030-1058 ?PT Time Calculation (min) (ACUTE ONLY): 28 min ? ?Charges:  $Gait Training: 23-37 mins          ?          ? ?Moishe Spice, PT, DPT ?Acute Rehabilitation Services  ?Pager: [phone] ?Office: [phone] ? ? ? ?Maretta Bees Pettis ?  05/16/2021, 11:07 AM ? ?

## 2021-05-16 NOTE — Progress Notes (Signed)
? ?  Pt overall remains a very poor candidate for EP procedures.  ? ?Given his ESRD access he is a very poor candidate for transvenous pacing.  ? ?He may ultimately be a candidate for leadless pacing; We would not likely recommend this prior to next week given his on-going acute issues and need for HD.  ? ?We will plan to formally consult the patient 3/24 as both providers on staff that day perform leadless pacing.     ? ?I have previously discussed his case with both Dr. Lovena Le and Dr. Quentin Ore.  ? ?Given his age and co-morbidities, goals of care conversation are also recommended.  ? ?Lollie Marrow, PA-C  ?05/16/2021 8:28 AM  ?

## 2021-05-16 NOTE — Progress Notes (Addendum)
MD Tamala Julian notified of patient's heart rate decreasing down to 38 beats per minute.  Patient's heart rate ranging from 38-47 beats per minute.  Patient asymptomatic.  MD Tamala Julian consulted electrophysiology MD.  ? ?MD Tamala Julian advised if patient remains asymptomatic, RN to continue to monitor patient.  If patient becomes symptomatic, MD Tamala Julian advised RN to restart dopamine drip.  ?

## 2021-05-16 NOTE — Progress Notes (Signed)
? ?NAME:  Peter Oneill, MRN:  [id number], DOB:  May 09, 1940, LOS: 3 ?ADMISSION DATE:  05/13/2021, CONSULTATION DATE:  05/13/2021 ?REFERRING MD:  , CHIEF COMPLAINT:  Dyspnea, Bradycardia   ? ?History of Present Illness:  ?Peter Oneill is a/an 81 y.o. male with a past medical history of CKD5, HTN, OSA, asbestosis, CAD s/p CABG who presents to [facility] with worsening edema, SOB, volume overload. Is s/p RIJ TDC placement today with Dr. Augustin Coupe as an outpatient at [facility] but was sent directly here after catheter placement for dyspnea. Had been on lasix 120mg  BID w/ metolazone 10mg  daily as an outpatient. Was found to be bradycardic here  in the ED on 3/20 with HR in the 20's, which responded to atropine, now in the 40's on Dopamine . ? Underlying heart Block vs QRS prolongation.  Plan is to initiate bedside HD today in the ICU  ? ?Pertinent  Medical History  ? ?Past Medical History:  ?Diagnosis Date  ? Asbestosis ([place])   ? Contraindication to percutaneous coronary intervention (PCI)   ? Coronary artery disease   ? Dyslipidemia   ? Dyspnea   ? History of anemia   ? Hypercholesterolemia   ? Hypertension   ? Ileus ([place])   ? Pleural effusion on left   ? Pneumonia   ? Renal failure   ? Sleep apnea   ? CABG 2003, coronary stenting 2004, repeat CABG 2016, repeat stenting 2017.  Also with COPD, CKD 4, bilateral carpal tunnel release. ? ?Significant Hospital Events: ?Including procedures, antibiotic start and stop dates in addition to other pertinent events   ?04/23/2021 left-sided brachiocephalic fistula creation ?05/13/2021 [place] cath placed by Dr. Augustin Coupe for emergent HD.  ?05/13/2021 Admitted from [facility] to [facility] ED for dyspnea and bradycardia into the 20's  requiring atropine and dopamine , ? Heart Block ( Cardiology consulted)  , volume overload.  ?First HD scheduled for today as bedside in the ICU ?Echo 03/2020 LVEF of 50-55% with anterior septal hypokinesis mild LVH as well as mildly enlarged RV with mildly reduced systolic function. ? ?Interim  History / Subjective:  ?Oozing from tunneled cath overnight controlled with thrombipad. ? ?Remains on dopamine due to low HR. ? ?Objective   ?Blood pressure (!) 126/46, pulse (!) 46, temperature 98 ?F (36.7 ?C), temperature source Oral, resp. rate 18, height 5\' 9"  (1.753 m), weight 104 kg, SpO2 96 %. ?   ?   ? ?Intake/Output Summary (Last 24 hours) at 05/16/2021 1054 ?Last data filed at 05/16/2021 0900 ?Gross per 24 hour  ?Intake 459.05 ml  ?Output 3200 ml  ?Net -2740.95 ml  ? ? ?Filed Weights  ? 05/15/21 0500 05/15/21 1506 05/15/21 1806  ?Weight: 107.3 kg 107 kg 104 kg  ? ? ?Examination: ?No distress ?Lungs clear ?Global anasarca stable ?Ext warm, heart sounds bradycardic ?Moves all 4 ext to command, able to ambulate ?Abdomen hypoactive BS, protuberant but he says actually looks smaller than usual (thanks to fluid removal) ?Skin: Sacral pressure wound documented per nursing.  Tunneled RIJ HD cath with some ongoing oozing but improved per nursing. ? ?BMP improved after HD ?Plts are dropping ? ?Resolved Hospital Problem list   ? ? ?Assessment & Plan:  ? ?Bradycardia ?-? wenkebach block vs complete heart block on presentation. Responded to atropine ?P: ?-Wean off dopamine as able, would go by symptoms rather than pure HR ?-If euvolemic and still dropping HR may need consideration for device, EP to see tomorrow ? ?Acute on chronic diastolic HF ?Hx  CAD ?HTN  ?HLD  ?New ESRD on baseline CKD5- follows with Justin Mend as OP. ?-Creatinine on admit 9.16, GFR 5 ?-Dry weight seems to be around 98-99kg, currently 104kg ?P: ?- Aspirin, statin ?- Negative as tolerated by iHD try to get to euvolemia ? ?Restrictive lung disease due to asbestos exposure with related pleural plaques ?Hx OSA, intolerant of CPAP ?Hx COPD ?P: ?-Continue Anora Ellipta ?-I-S mobility pulmonary hygiene ? ?Constipation, hx ileus ?- Check KUB, strengthen bowel regimen, consider reglan if recurrent ileus ? ?Thrombocytopenia- a bit worse today, timing not c/w HIT but  does not need ppx, will D/C heparin for now.  Okay to use for HD catheter lock. ? ?Best Practice (right click and "Reselect all SmartList Selections" daily)  ? ?Diet/type: Regular consistency (see orders) ?DVT prophylaxis: prophylactic heparin  ?GI prophylaxis: PPI ?Lines: Dialysis Catheter ?Foley:  N/A ?Code Status:  full code ?Last date of multidisciplinary goals of care discussion: goal home with iHD; remaining issues are bradycardia, euvolemia ? ?Patient critically ill due to shock, bradycardia ?Interventions to address this today dopamine titration ?Risk of deterioration without these interventions is high ? ?I personally spent 31 minutes providing critical care not including any separately billable procedures ? ?Erskine Emery MD ?[facility] Pulmonary Critical Care ? ?Prefer epic messenger for cross cover needs ?If after hours, please call E-link ? ? ? ? ?  ?

## 2021-05-16 NOTE — Progress Notes (Signed)
eLink Physician-Brief Progress Note ?Patient Name: Peter Oneill ?DOB: January 12, 1941 ?MRN: [id number] ? ? ?Date of Service ? 05/16/2021  ?HPI/Events of Note ? Multiple issues: 1. Oozing R IJ HD catheter site and 2. Irritated hemorrhoid.  ?eICU Interventions ? Plan: ?Thrombipad to R IJ HD catheter site. ?Anusol cream to rectum now and TID.  ? ? ? ?Intervention Category ?Major Interventions: Other: ? ?Peter Oneill ?05/16/2021, 5:22 AM ?

## 2021-05-17 DIAGNOSIS — Z789 Other specified health status: Secondary | ICD-10-CM

## 2021-05-17 DIAGNOSIS — R001 Bradycardia, unspecified: Secondary | ICD-10-CM | POA: Diagnosis not present

## 2021-05-17 DIAGNOSIS — E8770 Fluid overload, unspecified: Secondary | ICD-10-CM | POA: Diagnosis not present

## 2021-05-17 DIAGNOSIS — I459 Conduction disorder, unspecified: Secondary | ICD-10-CM | POA: Diagnosis not present

## 2021-05-17 LAB — RENAL FUNCTION PANEL
Albumin: 2.5 g/dL — ABNORMAL LOW (ref 3.5–5.0)
Anion gap: 12 (ref 5–15)
BUN: 79 mg/dL — ABNORMAL HIGH (ref 8–23)
CO2: 23 mmol/L (ref 22–32)
Calcium: 8.1 mg/dL — ABNORMAL LOW (ref 8.9–10.3)
Chloride: 99 mmol/L (ref 98–111)
Creatinine, Ser: 6.7 mg/dL — ABNORMAL HIGH (ref 0.61–1.24)
GFR, Estimated: 8 mL/min — ABNORMAL LOW (ref >60)
Glucose, Bld: 110 mg/dL — ABNORMAL HIGH (ref 70–99)
Phosphorus: 7.2 mg/dL — ABNORMAL HIGH (ref 2.5–4.6)
Potassium: 4.3 mmol/L (ref 3.5–5.1)
Sodium: 134 mmol/L — ABNORMAL LOW (ref 135–145)

## 2021-05-17 LAB — CBC
HCT: 25.7 % — ABNORMAL LOW (ref 39.0–52.0)
Hemoglobin: 8.6 g/dL — ABNORMAL LOW (ref 13.0–17.0)
MCH: 30.9 pg (ref 26.0–34.0)
MCHC: 33.5 g/dL (ref 30.0–36.0)
MCV: 92.4 fL (ref 80.0–100.0)
Platelets: 38 10*3/uL — ABNORMAL LOW (ref 150–400)
RBC: 2.78 MIL/uL — ABNORMAL LOW (ref 4.22–5.81)
RDW: 16.4 % — ABNORMAL HIGH (ref 11.5–15.5)
WBC: 5.3 10*3/uL (ref 4.0–10.5)
nRBC: 1.9 % — ABNORMAL HIGH (ref 0.0–0.2)

## 2021-05-17 LAB — MAGNESIUM: Magnesium: 2.9 mg/dL — ABNORMAL HIGH (ref 1.7–2.4)

## 2021-05-17 MED ORDER — MENTHOL 3 MG MT LOZG
1.0000 | LOZENGE | OROMUCOSAL | Status: DC | PRN
  Filled 2021-05-17: qty 9

## 2021-05-17 NOTE — Plan of Care (Signed)

## 2021-05-17 NOTE — Progress Notes (Signed)
Contacted [facility] and spoke to [place]. Clinic advised that pt may d/c this weekend. Pt can start at clinic on Tuesday per Elberta Fortis, [facility] manager.Contacted renal PA regarding clinic's need for orders should pt d/c on the weekend. Will assist as needed.  ? ?Melven Sartorius ?Renal Navigator ?[phone] ?

## 2021-05-17 NOTE — Progress Notes (Addendum)
? ?NAME:  Peter Oneill, MRN:  [id number], DOB:  1940-02-27, LOS: 4 ?ADMISSION DATE:  05/13/2021, CONSULTATION DATE:  05/13/2021 ?REFERRING MD:  , CHIEF COMPLAINT:  Dyspnea, Bradycardia   ? ?History of Present Illness:  ?Peter Oneill is a/an 81 y.o. male with a past medical history of CKD5, HTN, OSA, asbestosis, CAD s/p CABG who presents to [facility] with worsening edema, SOB, volume overload. Is s/p RIJ TDC placement today with Dr. Augustin Coupe as an outpatient at [facility] but was sent directly here after catheter placement for dyspnea. Had been on lasix 120mg  BID w/ metolazone 10mg  daily as an outpatient. Was found to be bradycardic here  in the ED on 3/20 with HR in the 20's, which responded to atropine, now in the 40's on Dopamine . ? Underlying heart Block vs QRS prolongation.  Plan is to initiate bedside HD today in the ICU  ? ?Pertinent  Medical History  ? ?Past Medical History:  ?Diagnosis Date  ? Asbestosis ([place])   ? Contraindication to percutaneous coronary intervention (PCI)   ? Coronary artery disease   ? Dyslipidemia   ? Dyspnea   ? History of anemia   ? Hypercholesterolemia   ? Hypertension   ? Ileus ([place])   ? Pleural effusion on left   ? Pneumonia   ? Renal failure   ? Sleep apnea   ? CABG 2003, coronary stenting 2004, repeat CABG 2016, repeat stenting 2017.  Also with COPD, CKD 4, bilateral carpal tunnel release. ? ?Significant Hospital Events: ?Including procedures, antibiotic start and stop dates in addition to other pertinent events   ?04/23/2021 left-sided brachiocephalic fistula creation ?05/13/2021 [facility] cath placed by Dr. Augustin Coupe for emergent HD.  ?05/13/2021 Admitted from [facility] to [facility] ED for dyspnea and bradycardia into the 20's  requiring atropine and dopamine , ? Heart Block ( Cardiology consulted)  , volume overload.  ?First HD scheduled for today as bedside in the ICU ?Echo 03/2020 LVEF of 50-55% with anterior septal hypokinesis mild LVH as well as mildly enlarged RV with mildly reduced systolic function. ? ?Interim  History / Subjective:  ? ?Currently on RA ?Dopamine off, HR 50's ?Underwent HD 3/23 ? ?Objective   ?Blood pressure (!) 132/57, pulse (!) 50, temperature 98.3 ?F (36.8 ?C), temperature source Oral, resp. rate 14, height 5\' 9"  (1.753 m), weight 103.5 kg, SpO2 94 %. ?   ?   ? ?Intake/Output Summary (Last 24 hours) at 05/17/2021 0759 ?Last data filed at 05/16/2021 2100 ?Gross per 24 hour  ?Intake 546.43 ml  ?Output 200 ml  ?Net 346.43 ml  ? ?Filed Weights  ? 05/15/21 1806 05/17/21 0500 05/17/21 0700  ?Weight: 104 kg 103.5 kg 103.5 kg  ? ? ?Examination: ?Obese man, sitting up in bed in no distress on room air ?Oropharynx clear, no stridor ?Lungs distant, clear, decreased to both bases.  No wheezes or crackles ?Heart distant, regular bradycardia 50s ?Abdomen obese, somewhat protuberant with hypoactive bowel sounds.  Nontender ?No rash ?Tunneled right IJ HD catheter in place.  Dressing clean ? ? ?Resolved Hospital Problem list   ? ? ?Assessment & Plan:  ? ?Bradycardia ?-? wenkebach block vs complete heart block on presentation. Responded to atropine ?P: ?-successfully weaned off dopa ?-appreciate cardiology eval, EP input 3/24 ? ?Acute on chronic diastolic HF ?Hx CAD ?HTN  ?HLD  ?New ESRD on baseline CKD5- follows with Peter Oneill as OP. ?-Creatinine on admit 9.16, GFR 5 ?-Dry weight seems to be around 98-99kg ?P: ?-ASA, statin ?-slow volume  removal w HD as he can tolerate ? ?Restrictive lung disease due to asbestos exposure with related pleural plaques ?Hx OSA, intolerant of CPAP ?Hx COPD ?P: ?-Continue Anoro ?-Pulmonary hygiene ? ?Constipation, hx ileus ?-Push bowel regimen ? ?Thrombocytopenia- stable but low. ? DIC, low likelihood HITT ?-following CBC ?-consider DIC panel, HITT panel if drops ?- no evidence bleeding ? ?Best Practice (right click and "Reselect all SmartList Selections" daily)  ? ?Diet/type: Regular consistency (see orders) ?DVT prophylaxis: prophylactic heparin  ?GI prophylaxis: PPI ?Lines: Dialysis  Catheter ?Foley:  N/A ?Code Status:  full code ?Last date of multidisciplinary goals of care discussion: goal home with iHD; remaining issues are bradycardia, euvolemia ? ?Family: Discussed plans with patient and wife at bedside ?Disposition: Patient can transition to telemetry 3/24, will ask TRH to assume his care as of 3/25 ? ?I personally spent 31 minutes providing critical care not including any separately billable procedures ? ? ? ?Baltazar Apo, MD, PhD ?05/17/2021, 8:09 AM ?[facility] ?[phone] or if no answer before 7:00PM call [phone] ?For any issues after 7:00PM please call eLink [phone] ? ? ?  ?

## 2021-05-17 NOTE — Progress Notes (Signed)
?                                                   ?Palliative Care Progress Note, Assessment & Plan  ? ?Patient Name: Peter Oneill       Date: 05/17/2021 ?DOB: 1940/12/24  Age: 81 y.o. MRN#: [id number] ?Attending Physician: Collene Gobble, MD ?Primary Care Physician: [name], MD ?Admit Date: 05/13/2021 ? ?Reason for Consultation/Follow-up: Establishing goals of care ? ?Subjective: ?Patient is sitting in bed receiving bedside HD.  No complications and patient is on fourth hour of HD.  He has no acute complaints.  He remembers our discussion and my presence from yesterday.  Wife and granddaughter at bedside. ? ?HPI: ?81 y.o. male  with past medical history CKD5, HTN, OSA, asbestosis, CAD s/p CABG who presents to [facility] with worsening edema, SOB, volume overload. He is s/p RIJ [facility] placement with Dr. Augustin Coupe as an outpatient but was sent directly here after catheter placement for dyspnea. Pt was on lasix 126m BID w/ metolazone 1107mdaily as an outpatient. On 3/20, he was bradycardic with HR in the 20's, which responded to atropine.[age].o. male  with past medical history CKD5, HTN, OSA, asbestosis, CAD s/p CABG [facility], [facility]. [facility] but was sent directly here after catheter placement for dyspnea. Pt was on lasix 12057mID w/ metolazone 24m67mily as an outpatient. On 3/20, he was bradycardic with HR in the 20's, which responded to atropine. ? ?Pt has completed HD on 3/20, 3/22, and is currently receiving HD.  His plan is to be discharged tomorrow and continue with outpatient HD on TTS schedule. ? ?Summary of counseling/coordination of care: ?After reviewing the patient's chart and assessing the patient at bedside, I met with patient, his wife, and his granddaughter at bedside.   Patient shares he is doing what he can.  He says he feels fine on the HD machine.  His wife at bedside continued to say that he is going to get better, he is doing really well, and that his kidneys will improve. ? ?After my discussion with the patient, patient's wife asked to speak with me in the hallway.  She shared her concerns regarding patient's CODE STATUS.  See note from 3/23 wherein patient declared himself DNR with wife present and in agreement.  ? ?She shared that patient does not know what he is talking about and as his medical power of attorney she would like to reverse his CODE STATUS.  I outlined that patient has capacity and that medical POA comes into play if patient does not have capacity.  Patient's wife shared that she does not think he would remember any conversation that he had yesterday.  I offered to discuss with patient and patient's wife declined.  She stated that granddaughter at bedside will be too upset to have a CODE STATUS discussion at this time.  Wife shared she is going to "take the armband off and change everything when he goes home tomorrow anyway". ? ?Patient CODE STATUS changed to full code.  Encouraged wife to contact PMT to further discuss CODE STATUS when patient's granddaughter is no longer at bedside.  I notified bedside nursing and attending Dr. ByruLamonte Sakaih patient's CODE STATUS changed due  to wife's concerns. ? ?Questions and concerns were addressed.  ? ?Code Status: ?Full code ? ?Discharge Planning: ?Home with Palliative Services ? ?Recommendations/Plan: ?Code status changed back to FULL CODE ?Continued discussions needed with pt input regarding ACP ?Outpt palliative recommended ? ?Care plan was discussed with patient, patient's wife, Dr. Lamonte Sakai, RN  ? ?Physical Exam ?Vitals and nursing note reviewed.  ?Constitutional:   ?   Appearance: Normal appearance.  ?HENT:  ?   Head: Normocephalic and atraumatic.  ?   Mouth/Throat:  ?   Mouth: Mucous membranes are moist.  ?Eyes:  ?    Pupils: Pupils are equal, round, and reactive to light.  ?Cardiovascular:  ?   Rate and Rhythm: Normal rate.  ?   Pulses: Normal pulses.  ?Pulmonary:  ?   Effort: Pulmonary effort is normal.  ?Abdominal:  ?   Palpations: Abdomen is soft.  ?Musculoskeletal:  ?   Comments: Generalized weakness  ?Skin: ?   General: Skin is warm and dry.  ?Neurological:  ?   Mental Status: He is alert and oriented to person, place, and time.  ?Psychiatric:     ?   Mood and Affect: Mood normal.     ?   Behavior: Behavior normal.     ?   Thought Content: Thought content normal.     ?   Judgment: Judgment normal.  ?         ? ?Palliative Assessment/Data: 60% ? ? ? ?Total Time 35 minutes  ?Greater than 50%  of this time was spent counseling and coordinating care related to the above assessment and plan. ? ?Thank you for allowing the Palliative Medicine Team to assist in the care of this patient. ? ?Verdell Carmine. Vidyuth Belsito, DNP, FNP-BC ?Palliative Medicine Team ?Team Phone # [phone] ?  ? ?

## 2021-05-17 NOTE — Progress Notes (Signed)
Occupational Therapy Treatment ?Patient Details ?Name: Peter Oneill ?MRN: [id number] ?DOB: [birth year] ?Today's Date: 05/17/2021 ? ? ?History of present illness 81 y.o. male who presented 05/13/21 to [facility] with worsening edema, SOB, volume overload. PMH: CKD5, HTN, OSA, asbestosis, CAD s/p CABG ?  ?OT comments ? Patient just completing dialysis, but will to participate with light ADL sinkside.  Patient needing up to [facility] for in room mobility due to min safety deficits, setup and supervision for upper body bathing, and up to Mod A for lower body ADL from a sit/stand level.  Patient able to stand for brushing his teeth with supervision.  Declined up to the recliner after ADL, but agreed to sit EOB with spouse to eat lunch.  OT will continue efforts in the acute setting, with [facility] OT recommended if he agrees.    ? ?Recommendations for follow up therapy are one component of a multi-disciplinary discharge planning process, led by the attending physician.  Recommendations may be updated based on patient status, additional functional criteria and insurance authorization. ?   ?Follow Up Recommendations ? Home health OT  ?  ?Assistance Recommended at Discharge Intermittent Supervision/Assistance  ?Patient can return home with the following ? A little help with walking and/or transfers;A little help with bathing/dressing/bathroom;Assistance with cooking/housework;Assist for transportation;Help with stairs or ramp for entrance ?  ?Equipment Recommendations ? None recommended by OT  ?  ?Recommendations for Other Services   ? ?  ?Precautions / Restrictions Precautions ?Precautions: Fall ?Precaution Comments: chronic back pain; watch HR (low) ?Restrictions ?Weight Bearing Restrictions: No  ? ? ?  ? ?Mobility Bed Mobility ?Overal bed mobility: Needs Assistance ?Bed Mobility: Supine to Sit ?  ?  ?Supine to sit: Supervision, HOB elevated ?  ?  ?  ?  ? ?Transfers ?Overall transfer level: Needs assistance ?Equipment used: Rolling  walker (2 wheels) ?Transfers: Sit to/from Stand, Bed to chair/wheelchair/BSC ?Sit to Stand: Supervision ?  ?  ?Step pivot transfers: Supervision ?  ?  ?  ?  ?  ?Balance Overall balance assessment: Needs assistance ?Sitting-balance support: Feet supported ?Sitting balance-Leahy Scale: Good ?  ?  ?Standing balance support: Reliant on assistive device for balance ?Standing balance-Leahy Scale: Poor ?  ?  ?  ?  ?  ?  ?  ?  ?  ?  ?  ?  ?   ? ?ADL either performed or assessed with clinical judgement  ? ?ADL   ?  ?  ?Grooming: Wash/dry hands;Wash/dry face;Standing;Supervision/safety ?  ?  ?  ?  ?  ?Upper Body Dressing : Sitting;Minimal assistance ?  ?Lower Body Dressing: Moderate assistance;Sit to/from stand ?  ?Toilet Transfer: Min guard;Rolling walker (2 wheels);Ambulation ?  ?  ?  ?  ?  ?  ?  ?  ? ?Extremity/Trunk Assessment Upper Extremity Assessment ?Upper Extremity Assessment: Generalized weakness ?  ?  ?  ?Cervical / Trunk Assessment ?Cervical / Trunk Assessment: Kyphotic ?  ? ?Vision   ?  ?  ?Perception   ?  ?Praxis   ?  ? ?Cognition Arousal/Alertness: Awake/alert ?Behavior During Therapy: Flat affect ?Overall Cognitive Status: Within Functional Limits for tasks assessed ?  ?  ?  ?  ?  ?  ?  ?  ?  ?  ?  ?  ?  ?  ?  ?  ?  ?  ?  ?   ?   ? ?  ?   ? ? ?  ?    ? ? ?  Pertinent Vitals/ Pain       Pain Assessment ?Pain Assessment: No/denies pain ?Pain Intervention(s): Monitored during session ? ?   ?  ?  ?  ?  ?  ?  ?  ?  ?  ?  ?  ?  ?  ?  ?  ?  ?  ?  ? ?  ?    ?  ?  ?  ?   ? ?Frequency ? Min 2X/week  ? ? ? ? ?  ?Progress Toward Goals ? ?OT Goals(current goals can now be found in the care plan section) ? Progress towards OT goals: Progressing toward goals ? ?Acute Rehab OT Goals ?OT Goal Formulation: With patient ?Time For Goal Achievement: 05/29/21 ?Potential to Achieve Goals: Good  ?Plan Discharge plan remains appropriate   ? ?Co-evaluation ? ? ?   ?  ?  ?  ?  ? ?  ?AM-PAC OT "6 Clicks" Daily Activity     ?Outcome  Measure ? ? Help from another person eating meals?: None ?Help from another person taking care of personal grooming?: A Little ?Help from another person toileting, which includes using toliet, bedpan, or urinal?: A Little ?Help from another person bathing (including washing, rinsing, drying)?: A Lot ?Help from another person to put on and taking off regular upper body clothing?: A Little ?Help from another person to put on and taking off regular lower body clothing?: A Lot ?6 Click Score: 17 ? ?  ?End of Session Equipment Utilized During Treatment: Rolling walker (2 wheels) ? ?OT Visit Diagnosis: Unsteadiness on feet (R26.81);Muscle weakness (generalized) (M62.81);Other symptoms and signs involving cognitive function ?  ?Activity Tolerance Patient tolerated treatment well ?  ?Patient Left in bed;with call bell/phone within reach;with family/visitor present ?  ?Nurse Communication Mobility status ?  ? ?   ? ?Time: 6314-9702 ?OT Time Calculation (min): 21 min ? ?Charges: OT General Charges ?$OT Visit: 1 Visit ?OT Treatments ?$Self Care/Home Management : 8-22 mins ? ?05/17/2021 ? ?RP, OTR/L ? ?Acute Rehabilitation Services ? ?Office:  [phone] ? ? ?Ivelise Castillo D [place] ?05/17/2021, 11:50 AM ?

## 2021-05-17 NOTE — Progress Notes (Signed)
?[facility] ?Progress Note  ? ? ?Assessment/ Plan:   ?CKD5, now ESRD ?-follows w/ Dr. Justin Mend as an outpatient ?-started HD 3/20, s/p slow start protocol (#2 on 3/21, #3 on 3/22).  Plan for another session of dialysis today with volume removal as tolerated ?-CLIP-referral made to [facility]; can start as early as Tuesday March 28th ?-Avoid nephrotoxic medications including NSAIDs and iodinated intravenous contrast exposure unless the latter is absolutely indicated.  Preferred narcotic agents for pain control are hydromorphone, fentanyl, and methadone. Morphine should not be used. Avoid Baclofen and avoid oral sodium phosphate and magnesium citrate based laxatives / bowel preps. Continue strict Input and Output monitoring. Will monitor the patient closely with you and intervene or adjust therapy as indicated by changes in clinical status/labs  ?  ?Hypervolemia, acute on chronic dCHF exacerbation, ESRD ?-failed outpatient diuresis ?-UF as tolerated on HD ?  ?Bradycardia ?-appreciate recommendations from cardiology ?-EP is consulted ?  ?Hyperkalemia ?-improved ?  ?AGMA 2/2 ESRD ?-improving w/ HD ?  ?Anemia due to chronic kidney disease: ?-Transfuse for Hgb<7 g/dL ?-Hgb dropped to 8.6. CTM; possibly dilutional ?-giving iron load for a total of 1g w/ HD ?  ?Dialysis access ?-s/p Left BC AVF placed 04/23/21--maturing ?-s/p RIJ [facility] 05/13/21 ([facility]) ? ?Thrombocytopenia ?Mgmt per primary ? ?Hyperphosphatemia ?-phos 7.2; ctm. Consider binder ? ? ? ?Subjective:   ?Patient feels okay today without any complaints.  Now DNR.  Says that he is tolerating dialysis fairly well.  ? ?Objective:   ?BP (!) 142/60   Pulse (!) 49   Temp (!) 97.4 ?F (36.3 ?C) (Oral)   Resp 13   Ht 5\' 9"  (1.753 m)   Wt 103.5 kg   SpO2 96%   BMI 33.70 kg/m?  ? ?Intake/Output Summary (Last 24 hours) at 05/17/2021 0640 ?Last data filed at 05/16/2021 2100 ?Gross per 24 hour  ?Intake 556.64 ml  ?Output 200 ml  ?Net 356.64 ml  ? ?Weight change: -3.5  kg ? ?Physical Exam: ?Gen:nad, lying in bed ?UJW:JXBJYNWGNFA, no obvious rub ?Resp: Bilateral chest rise with no increased work of breathing ?OZH:YQMVHQION but overall soft and nontender to palpation ?Ext:2+ pitting edema b/l LE's, warm and well perfused ?Neuro: awake, alert ?Dialysis access: RIJ TDC (with surrounding ecchymosis), LUE AVF +thrill (maturing) ? ?Imaging: ?DG Abd 1 View ? ?Result Date: 05/16/2021 ?CLINICAL DATA:  Ileus. EXAM: ABDOMEN - 1 VIEW COMPARISON:  None. FINDINGS: The bowel gas pattern is normal. No radio-opaque calculi or other significant radiographic abnormality are seen. IMPRESSION: Negative. Electronically Signed   By: Titus Dubin M.D.   On: 05/16/2021 15:55   ? ?Labs: ?BMET ?Recent Labs  ?Lab 05/13/21 ?6295 05/13/21 ?1040 05/13/21 ?1042 05/13/21 ?1203 05/13/21 ?1210 05/14/21 ?0119 05/15/21 ?0750 05/16/21 ?0105 05/17/21 ?2841  ?NA 139 134* 136 139  --  136 136 135 134*  ?K 5.2* 4.9 5.4* 5.0  --  4.6 4.6 3.9 4.3  ?CL 104  --  105 104  --  100 99 101 99  ?CO2 14*  --   --  16*  --  17* 20* 21* 23  ?GLUCOSE 134*  --  124* 120*  --  113* 99 102* 110*  ?BUN 174*  --  >130* 173*  --  142* 113* 69* 79*  ?CREATININE 8.91*  --  10.10* 9.16* 8.87* 8.09* 7.58* 5.70* 6.70*  ?CALCIUM 8.1*  --   --  8.1*  --  8.5* 8.4*  8.2* 8.2* 8.1*  ?PHOS  --   --   --  >  30.0*  --  11.4*  --  6.9* 7.2*  ? ?CBC ?Recent Labs  ?Lab 05/13/21 ?0029 05/13/21 ?1040 05/14/21 ?0119 05/15/21 ?0750 05/16/21 ?0105 05/17/21 ?8473  ?WBC 5.0   < > 8.0 6.8 6.0 5.3  ?NEUTROABS 4.3  --   --   --   --   --   ?HGB 10.1*   < > 11.0* 10.7* 10.0* 8.6*  ?HCT 29.9*   < > 32.5* 30.7* 29.4* 25.7*  ?MCV 93.7   < > 90.8 90.8 92.2 92.4  ?PLT 72*   < > 72* 54* 40* 38*  ? < > = values in this interval not displayed.  ? ? ?Medications:   ? ? aspirin EC  81 mg Oral Daily  ? bisacodyl  10 mg Rectal Daily  ? Chlorhexidine Gluconate Cloth  6 each Topical Q0600  ? clopidogrel  75 mg Oral Daily  ? ezetimibe  10 mg Oral Daily  ? hydrocortisone   Rectal  TID  ? pantoprazole  40 mg Oral Daily  ? polyethylene glycol  17 g Oral Daily  ? rosuvastatin  10 mg Oral Daily  ? sevelamer carbonate  1,600 mg Oral TID WC  ? umeclidinium-vilanterol  1 puff Inhalation Daily  ? ? ? ? ?Reesa Chew ? ?[facility] ?05/17/2021, 6:40 AM  ? ?

## 2021-05-18 DIAGNOSIS — N186 End stage renal disease: Secondary | ICD-10-CM

## 2021-05-18 DIAGNOSIS — D696 Thrombocytopenia, unspecified: Secondary | ICD-10-CM

## 2021-05-18 LAB — BASIC METABOLIC PANEL
Anion gap: 10 (ref 5–15)
BUN: 43 mg/dL — ABNORMAL HIGH (ref 8–23)
CO2: 26 mmol/L (ref 22–32)
Calcium: 8.3 mg/dL — ABNORMAL LOW (ref 8.9–10.3)
Chloride: 99 mmol/L (ref 98–111)
Creatinine, Ser: 4.67 mg/dL — ABNORMAL HIGH (ref 0.61–1.24)
GFR, Estimated: 12 mL/min — ABNORMAL LOW (ref >60)
Glucose, Bld: 109 mg/dL — ABNORMAL HIGH (ref 70–99)
Potassium: 3.6 mmol/L (ref 3.5–5.1)
Sodium: 135 mmol/L (ref 135–145)

## 2021-05-18 LAB — CBC
HCT: 25.7 % — ABNORMAL LOW (ref 39.0–52.0)
Hemoglobin: 8.7 g/dL — ABNORMAL LOW (ref 13.0–17.0)
MCH: 31.4 pg (ref 26.0–34.0)
MCHC: 33.9 g/dL (ref 30.0–36.0)
MCV: 92.8 fL (ref 80.0–100.0)
Platelets: 44 10*3/uL — ABNORMAL LOW (ref 150–400)
RBC: 2.77 MIL/uL — ABNORMAL LOW (ref 4.22–5.81)
RDW: 16.6 % — ABNORMAL HIGH (ref 11.5–15.5)
WBC: 5.9 10*3/uL (ref 4.0–10.5)
nRBC: 2.2 % — ABNORMAL HIGH (ref 0.0–0.2)

## 2021-05-18 LAB — PHOSPHORUS: Phosphorus: 5 mg/dL — ABNORMAL HIGH (ref 2.5–4.6)

## 2021-05-18 LAB — MAGNESIUM: Magnesium: 2.4 mg/dL (ref 1.7–2.4)

## 2021-05-18 MED ORDER — HEPARIN SODIUM (PORCINE) 1000 UNIT/ML IJ SOLN
INTRAMUSCULAR | Status: AC
  Administered 2021-05-18: 1000 [IU]
  Filled 2021-05-18: qty 4

## 2021-05-18 NOTE — Assessment & Plan Note (Addendum)
This is relatively new as far as I can see.  His platelets were 120s in 2022, but here down to 70s on admisison, stable in 40s since then. ?- Stop Plavix ?- May stop aspirin as well ?- Hematology follow up ?

## 2021-05-18 NOTE — Assessment & Plan Note (Signed)
Volume control via HD ?- Consult Nephrology, appreciate cares ?

## 2021-05-18 NOTE — Assessment & Plan Note (Signed)
-  Continue Anoro ?- Consult Palliative care ?

## 2021-05-18 NOTE — Assessment & Plan Note (Signed)
-   Consult EP, appreciate cares ?

## 2021-05-18 NOTE — Progress Notes (Addendum)
?  Progress Note ? ? ?Patient: Peter Oneill ZOX:[id number] DOB: 02-May-1940 DOA: 05/13/2021     5 ?DOS: the patient was seen and examined on 05/18/2021 ?  ? ? ? ?Brief hospital course: ?Dillinger Aston is a/an 81 y.o. male with a past medical history of CKD5, HTN, OSA, asbestosis, CAD s/p CABG who presents to [facility] with worsening edema, SOB, volume overload. Is s/p RIJ TDC placement on day of admission with Dr. Augustin Coupe as an outpatient at [facility] but was sent directly here after catheter placement for dyspnea.  ? ?Was found to be bradycardic here  in the ED on 3/20 with HR in the 20's, which responded to atropine, up to the 40's on Dopamine .  Admitted for bedside initiation of HD in the ICU and EP eval ? ? ?05/13/2021 [facility] cath placed by Dr. Augustin Coupe for emergent HD then admitted from [facility] to [facility] ED for dyspnea and bradycardia into the 20's  requiring atropine and dopamine , ? Heart Block ( Cardiology consulted)  , volume overload.  ?First HD scheduled for today as bedside in the ICU ?Echo 03/2020 LVEF of 50-55% with anterior septal hypokinesis mild LVH as well as mildly enlarged RV with mildly reduced systolic function. ? ? ? ? ? ?Assessment and Plan: ?* Bradycardia ?- Consult EP, appreciate cares ? ?ESRD (end stage renal disease) ([place]) ?- Consult Nephrology ? ?Thrombocytopenia ([place]) ?This is relatively new as far as I can see.  His platelets were 120s in 2022, but here down to 70s on admisison, stable in 40s since then. ?- Stop Plavix ?- May stop aspirin as well ?- Hematology follow up ? ?Acute CHF (congestive heart failure) ([place]) ?Volume control via HD ?- Consult Nephrology, appreciate cares ? ?Hypertension ?-Hold amlodipine ?- Stop furosemide now that HD ?- Stop Coreg now that CHB ? ?Asbestosis ([place]) ?-Continue Anoro ?- Consult Palliative care ? ?Carotid disease, bilateral ([place]) ?-Continue Zetia, Crestor ?-Continue aspirin for now, but probably should stop this with his new Thrombocytopenia ? ? ? ? ?  ? ?Subjective: Expressing  desire to die.  Does not wish to do dialysis.  Feels weak and tired.  No chest pain, dyspnea, fever. ? ?Physical Exam: ?Vitals:  ? 05/18/21 0425 05/18/21 8119 05/18/21 1478 05/18/21 2956  ?BP: (!) 124/51  (!) 144/70   ?Pulse: (!) 52  60   ?Resp: 17  15   ?Temp: 98.5 ?F (36.9 ?C)  97.6 ?F (36.4 ?C)   ?TempSrc: Oral  Oral   ?SpO2: 98%  93% 95%  ?Weight:  100.2 kg    ?Height:      ? ?Elderly adult male. ?RRR, slow regular, systolic murmur, JVP up, mild pititng edema. ?Respiratory rate normal.  Lungs clear, no wheezing or rales. ?Attention normal, affect sad, moves upper extreimties with waekness but symmetric cordination, face symmetric. Oriented to person, place time and situation. ? ? ? ? ?Data Reviewed: ?Nephrology notes reviewed, CCM notes reviewed, palliative notes reviewed.  Nursing notes and vital signs reviewed. ?Blood counts and electrolytes reviewed. ? ?Family Communication:  ? ? ?Disposition: ?Status is: Inpatient ?Remains inpatient appropriate because: He will require significant rehabilitation to return to his prior level of function, we will await placement. ? ? ? ? Planned Discharge Destination: Skilled nursing facility ? ? ? ? ?Author: ?Edwin Dada, MD ?05/18/2021 8:31 AM ? ?For on call review [email or web address].  ?

## 2021-05-18 NOTE — Assessment & Plan Note (Signed)
-   Consult Nephrology 

## 2021-05-18 NOTE — Plan of Care (Signed)

## 2021-05-18 NOTE — Assessment & Plan Note (Addendum)
-  Continue Zetia, Crestor ?-Continue aspirin for now, but probably should stop this with his new Thrombocytopenia ?

## 2021-05-18 NOTE — Hospital Course (Addendum)
Peter Oneill is a/an 81 y.o. male with a past medical history of CKD5, HTN, OSA, asbestosis, CAD s/p CABG who presents to [facility] with worsening edema, SOB, volume overload. Is s/p RIJ TDC placement on day of admission with Dr. Augustin Coupe as an outpatient at [facility] but was sent directly here after catheter placement for dyspnea.  ? ?Was found to be bradycardic here  in the ED on 3/20 with HR in the 20's, which responded to atropine, up to the 40's on Dopamine .  Admitted for bedside initiation of HD in the ICU and EP eval ? ? ?05/13/2021 [facility] cath placed by Dr. Augustin Coupe for emergent HD then admitted from [facility] to [facility] ED for dyspnea and bradycardia into the 20's  requiring atropine and dopamine , ? Heart Block ( Cardiology consulted)  , volume overload.  ?First HD scheduled for today as bedside in the ICU ?Echo 03/2020 LVEF of 50-55% with anterior septal hypokinesis mild LVH as well as mildly enlarged RV with mildly reduced systolic function. ?

## 2021-05-18 NOTE — Assessment & Plan Note (Addendum)
-  Hold amlodipine ?- Stop furosemide now that HD ?- Stop Coreg now that CHB ?

## 2021-05-18 NOTE — Progress Notes (Signed)
?[facility] ?Progress Note  ? ? ?Assessment/ Plan:   ?CKD5, now ESRD ?-follows w/ Dr. Justin Mend as an outpatient ?-started HD 3/20, s/p slow start protocol (#2 on 3/21, #3 on 3/22).  S/p HD yesterday, will plan for extra treatment today for volume removal and to keep him on TTS sched ?-CLIP-referral made to [facility]; can start as early as Tuesday March 28th ?-Avoid nephrotoxic medications including NSAIDs and iodinated intravenous contrast exposure unless the latter is absolutely indicated.  Preferred narcotic agents for pain control are hydromorphone, fentanyl, and methadone. Morphine should not be used. Avoid Baclofen and avoid oral sodium phosphate and magnesium citrate based laxatives / bowel preps. Continue strict Input and Output monitoring. Will monitor the patient closely with you and intervene or adjust therapy as indicated by changes in clinical status/labs  ?  ?Hypervolemia, acute on chronic dCHF exacerbation, ESRD ?-failed outpatient diuresis ?-UF as tolerated on HD, extra tx today ?  ?Bradycardia ?-appreciate recommendations from cardiology ?-EP is consulted ?  ?Anemia due to chronic kidney disease: ?-Transfuse for Hgb<7 g/dL ?-giving iron load for a total of 1g w/ HD ?  ?Dialysis access ?-s/p Left BC AVF placed 04/23/21--maturing ?-s/p RIJ [facility] 05/13/21 (CKV) ? ?Thrombocytopenia ?Mgmt per primary ? ?Hyperphosphatemia ?-Consider binder, phos improving. On renvela ? ? ? ?Subjective:   ?Patient seen and examined bedside this morning.  Out of ICU. He reports that he does not want to go home. Tolerated hd yesterday with net uf 3L. He is open to having HD today  ? ?Objective:   ?BP (!) 144/70 (BP Location: Right Arm)   Pulse 60   Temp 97.6 ?F (36.4 ?C) (Oral)   Resp 15   Ht 5\' 9"  (1.753 m)   Wt 100.2 kg   SpO2 93%   BMI 32.62 kg/m?  ? ?Intake/Output Summary (Last 24 hours) at 05/18/2021 0806 ?Last data filed at 05/18/2021 0756 ?Gross per 24 hour  ?Intake 360 ml  ?Output 3025 ml  ?Net -2665 ml   ? ?Weight change: -3 kg ? ?Physical Exam: ?Gen:nad, sitting up in bed ?DXA:JOINOMVEHMC, no obvious rub ?Resp: Bilateral chest rise with no increased work of breathing ?NOB:SJGGEZMOQ but overall soft and nontender to palpation ?Ext:2+ pitting edema b/l LE's, warm and well perfused ?Neuro: awake, alert ?Dialysis access: RIJ TDC (with surrounding ecchymosis), LUE AVF +thrill (maturing) ? ?Imaging: ?DG Abd 1 View ? ?Result Date: 05/16/2021 ?CLINICAL DATA:  Ileus. EXAM: ABDOMEN - 1 VIEW COMPARISON:  None. FINDINGS: The bowel gas pattern is normal. No radio-opaque calculi or other significant radiographic abnormality are seen. IMPRESSION: Negative. Electronically Signed   By: Titus Dubin M.D.   On: 05/16/2021 15:55   ? ?Labs: ?BMET ?Recent Labs  ?Lab 05/13/21 ?9476 05/13/21 ?1040 05/13/21 ?1042 05/13/21 ?1203 05/13/21 ?1210 05/14/21 ?0119 05/15/21 ?0750 05/16/21 ?0105 05/17/21 ?5465 05/18/21 ?0354  ?NA 139   < > 136 139  --  136 136 135 134* 135  ?K 5.2*   < > 5.4* 5.0  --  4.6 4.6 3.9 4.3 3.6  ?CL 104  --  105 104  --  100 99 101 99 99  ?CO2 14*  --   --  16*  --  17* 20* 21* 23 26  ?GLUCOSE 134*  --  124* 120*  --  113* 99 102* 110* 109*  ?BUN 174*  --  >130* 173*  --  142* 113* 69* 79* 43*  ?CREATININE 8.91*  --  10.10* 9.16* 8.87* 8.09* 7.58*  5.70* 6.70* 4.67*  ?CALCIUM 8.1*  --   --  8.1*  --  8.5* 8.4*  8.2* 8.2* 8.1* 8.3*  ?PHOS  --   --   --  >30.0*  --  11.4*  --  6.9* 7.2* 5.0*  ? < > = values in this interval not displayed.  ? ?CBC ?Recent Labs  ?Lab 05/13/21 ?6433 05/13/21 ?1040 05/15/21 ?0750 05/16/21 ?0105 05/17/21 ?2951 05/18/21 ?8841  ?WBC 5.0   < > 6.8 6.0 5.3 5.9  ?NEUTROABS 4.3  --   --   --   --   --   ?HGB 10.1*   < > 10.7* 10.0* 8.6* 8.7*  ?HCT 29.9*   < > 30.7* 29.4* 25.7* 25.7*  ?MCV 93.7   < > 90.8 92.2 92.4 92.8  ?PLT 72*   < > 54* 40* 38* 44*  ? < > = values in this interval not displayed.  ? ? ?Medications:   ? ? aspirin EC  81 mg Oral Daily  ? bisacodyl  10 mg Rectal Daily  ?  Chlorhexidine Gluconate Cloth  6 each Topical Q0600  ? clopidogrel  75 mg Oral Daily  ? ezetimibe  10 mg Oral Daily  ? hydrocortisone   Rectal TID  ? pantoprazole  40 mg Oral Daily  ? polyethylene glycol  17 g Oral Daily  ? rosuvastatin  10 mg Oral Daily  ? sevelamer carbonate  1,600 mg Oral TID WC  ? umeclidinium-vilanterol  1 puff Inhalation Daily  ? ? ? ? ?Peter Oneill ? ?[facility] ?05/18/2021, 8:06 AM  ? ?

## 2021-05-19 MED ORDER — AMLODIPINE BESYLATE 5 MG PO TABS: 5.0000 mg | ORAL_TABLET | Freq: Every day | ORAL | 3 refills | Status: DC

## 2021-05-19 MED ORDER — SEVELAMER CARBONATE 800 MG PO TABS: 1600.0000 mg | ORAL_TABLET | Freq: Three times a day (TID) | ORAL | 3 refills | Status: DC

## 2021-05-19 MED ORDER — FERROUS SULFATE 325 (65 FE) MG PO TBEC: 325.0000 mg | DELAYED_RELEASE_TABLET | Freq: Two times a day (BID) | ORAL | 3 refills | Status: DC

## 2021-05-19 NOTE — Discharge Summary (Signed)
?Physician Discharge Summary ?  ?Patient: Peter Oneill MRN: [id number] DOB: [birth year]  ?Admit date:     05/13/2021  ?Discharge date: 05/19/21  ?Discharge Physician: Edwin Dada  ? ?PCP: [name], MD  ? ?Recommendations at discharge:  ?Follow up with PCP Dr. Sabra Heck in 1 week ?Dr. Sabra Heck: Please repeat CBC in 1 week and refer for Hematology if platelets remain low ?Follow up with Nephrology for HD starting this Tuesday ?Follow up with Electrophysiology Dr. Quentin Ore for bradycardia ?Dr. Sabra Heck: Patient is high risk for failure to thrive on HD, given his bradyarrhythmia, CHF, and asbestosis; please revisit goals of care ? ? ? ? ?Discharge Diagnoses: ?Principal Problem: ?  Acute renal failure ?Active Problems: ?  Complete heart block ?  Acute on chronic diastolic CHF (congestive heart failure) ([place]) ?  Coronary artery disease ?  Carotid disease, bilateral ([place]) ?  Asbestosis ([place]) ?  Hypertension ?  Heart block ?  Pressure injury of skin ?  Thrombocytopenia ([place]) ?  ESRD (end stage renal disease) ([place]) ?  ? ? ? ? ?Hospital Course: ?Peter Oneill is a/an 81 y.o. male with a past medical history of CKD5, HTN, OSA, asbestosis, CAD s/p CABG who presented to [facility] with worsening edema, SOB, volume overload. The patient had been developing progresive swelling and shortness of breath prior to admission.   ? ?On the day of admission with Dr. Augustin Coupe placed RIJ [facility] placement as an outpatient at [facility] but was sent directly here after catheter placement for dyspnea.  ? ?Was found to be bradycardic here in the ED on 3/20 with HR in the 20's, which responded to atropine, up to the 40's on Dopamine.  Admitted for bedside initiation of HD in the ICU and EP eval ? ? ? ? ?* Bradycardia ?Acute renal failure on Chronic kidney disease stage V ?Likely now ESRD (end stage renal disease) ([place]) ?05/13/2021 [facility] cath placed by Dr. Augustin Coupe for emergent HD given swelling, hypoxia, dyspena.   ? ?Found to have bradycardia into the 20s, given  atropine and started on dopamine and initiated HD in the ICU at the bedside.   ? ? ?Cardiology consulted, found to have complete heart block intermittently and high grade sinus bradycardia.  Pacemaker was discussed but deferred given his thrombocytopenia and improvement in his bradycardia with stopping BB and dialysis. ? ? ? ?Thrombocytopenia ([place]) ?This is relatively new as far as I can see.  His platelets were 120s in 2022, but here down to 70s on admisison, stable in 40s since then. ?- Stop Plavix and aspirin ?- Repeat as an outpatient ? ?Acute on chronic diastolic CHF ?Underwent serial dialysis of 15L fluid, still overloaded at discharge but improved symptomatically.   ? ?Echo 03/2020 LVEF of 50-55% with anterior septal hypokinesis mild LVH as well as mildly enlarged RV with mildly reduced systolic function. ? ?Hypertension ?- Stop furosemide now that he has initiated dialysis ?- Stop Coreg now that CHB ? ? ? ? ?  ?Pain control - [employer] Controlled Substance Reporting System database was reviewed.  ? ? ?Consultants: Electrophysiology, PCCM, Nephrology ?Disposition: Home ? ?DISCHARGE MEDICATION: ?Allergies as of 05/19/2021   ? ?   Reactions  ? Atorvastatin Other (See Comments)  ? Muscle aches  ? ?  ? ?  ?Medication List  ?  ? ?STOP taking these medications   ? ?aspirin EC 81 MG tablet ?  ?carvedilol 3.125 MG tablet ?Commonly known as: COREG ?  ?clopidogrel 75 MG tablet ?Commonly known  as: PLAVIX ?  ?furosemide 80 MG tablet ?Commonly known as: LASIX ?  ?VITAMIN D3 PO ?  ? ?  ? ?TAKE these medications   ? ?allopurinol 100 MG tablet ?Commonly known as: ZYLOPRIM ?Take 100 mg by mouth daily. ?  ?amLODipine 5 MG tablet ?Commonly known as: NORVASC ?Take 1 tablet (5 mg total) by mouth daily. ?What changed:  ?medication strength ?how much to take ?  ?Anoro Ellipta 62.5-25 MCG/ACT Aepb ?Generic drug: umeclidinium-vilanterol ?Inhale 1 puff into the lungs daily. USE 1 INHALATION ONCE DAILY ?What changed: additional  instructions ?  ?ezetimibe 10 MG tablet ?Commonly known as: ZETIA ?Take 10 mg by mouth in the morning. ?  ?ferrous sulfate 325 (65 FE) MG EC tablet ?Take 1 tablet (325 mg total) by mouth 2 (two) times daily. ?  ?FISH OIL PO ?Take 2,000 mg by mouth daily. ?  ?HYDROcodone-acetaminophen 5-325 MG tablet ?Commonly known as: Norco ?Take 1 tablet by mouth every 6 (six) hours as needed for moderate pain. ?  ?hydrocortisone 25 MG suppository ?Commonly known as: ANUSOL-HC ?Place 25 mg rectally daily as needed for hemorrhoids or anal itching. ?  ?latanoprost 0.005 % ophthalmic solution ?Commonly known as: XALATAN ?Place 1 drop into both eyes at bedtime. ?  ?multivitamin tablet ?Take 1 tablet by mouth daily. ?  ?nitroGLYCERIN 0.4 MG SL tablet ?Commonly known as: NITROSTAT ?Place 1 tablet (0.4 mg total) under the tongue every 5 (five) minutes as needed for chest pain. ?  ?rosuvastatin 10 MG tablet ?Commonly known as: CRESTOR ?Take 10 mg by mouth daily. ?  ?sevelamer carbonate 800 MG tablet ?Commonly known as: RENVELA ?Take 2 tablets (1,600 mg total) by mouth 3 (three) times daily with meals. ?  ?silodosin 4 MG Caps capsule ?Commonly known as: RAPAFLO ?Take 4 mg by mouth daily with breakfast. ?  ? ?  ? ? Follow-up Information   ? ? [facility], [facility] Go on 05/21/2021.   ?Why: Schedule is Tuesday/Thursday/Saturday with 12:00 chair time.  ?For first appointment, patient needs to arrive at 11:00 to complete paperwork prior to treatment. ?Contact information: ?[place] [address] ?[phone] ? ? ?  ?  ? ? [name], MD. Schedule an appointment as soon as possible for a visit in 1 week(s).   ?Specialty: Family Medicine ?Contact information: ?[address] ?[place] [address] ?[phone] ? ? ?  ?  ? ? Edrick Oh, MD. Call.   ?Specialty: Nephrology ?Why: To arrange follow up as directed by Dr. Justin Mend ?Contact information: ?[address] ?[place] [address] ?[phone] ? ? ?  ?   ? ? Vickie Epley, MD Follow up.   ?Specialties: Cardiology, Radiology ?Why: office will give a call with appointment ?Contact information: ?[address] ?[place] [address] ?[phone] ? ? ?  ?  ? ? Care, [place] Follow up.   ?Why: HHPTOT arranged ?Contact information: ?[place] [address] ?[phone] ? ? ?  ?  ? ?  ?  ? ?  ? ?Discharge Instructions   ? ? Discharge instructions   Complete by: As directed ?  ? From Dr. Loleta Books: ?You were admitted for worsening kidney failure and needing to start dialysis.  ?You were also found to have an abnormally slow heart rate ?This is called "bradycardia" ?This improved slightly with dialysis, but I would still rcommmend you follo up with the Electrophysiology Cardiologist, Dr. Quentin Ore. ?Call his office (See below)  for instructions on follow up ? ? ?For now: ?STOP carvedilol/Coreg because this slows the heart rate which would be dangerous for you now ?You should also stop Lasix, until you see Dr. Justin Mend and ask him about this medication, likely you will not take it anymore ? ?Reduce your amlodipine to 5 mg daily from 10 ? ?Also, your "platelets" (clotting cells in blood) are very low. ? ?STOP aspirin and clopidogrel/Plavix (do not take them) ?Go see your primary in 1 week and repeat platelets at that time ? ? ?To help with your anemia (low blood levels) take iron supplements, ferrous sulfate ?The pills may say to take them every day, but I recommend every other day  ? Increase activity slowly   Complete by: As directed ?  ? No wound care   Complete by: As directed ?  ? ?  ? ? ?Discharge Exam: ?Filed Weights  ? 05/18/21 1345 05/18/21 1656 05/19/21 0530  ?Weight: 103 kg 100 kg 97.8 kg  ? ?General: Pt is alert, awake, not in acute distress ?Cardiovascular: RRR, nl S1-S2, no murmurs appreciated.   Still 2+ edema.   ?Respiratory: Normal respiratory rate and rhythm.  CTAB without rales or wheezes. ?Abdominal: Abdomen soft and  non-tender.  No distension or HSM.   ?Neuro/Psych: Strength symmetric in upper and lower extremities but generally weak.  Judgment and insight appear normal, ambulated in the hall without symptoms ? ? ?Condition

## 2021-05-19 NOTE — Progress Notes (Signed)
?[facility] ?Progress Note  ? ? ?Assessment/ Plan:   ?CKD5, now ESRD ?-follows w/ Dr. Justin Mend as an outpatient ?-started HD 3/20, s/p slow start protocol (#2 on 3/21, #3 on 3/22).  S/p extra HD yesterday, next HD Tues provided he is doing okay from a volume standpoint ?-CLIP-referral made to [facility]; can start as early as Tuesday March 28th ?-Avoid nephrotoxic medications including NSAIDs and iodinated intravenous contrast exposure unless the latter is absolutely indicated.  Preferred narcotic agents for pain control are hydromorphone, fentanyl, and methadone. Morphine should not be used. Avoid Baclofen and avoid oral sodium phosphate and magnesium citrate based laxatives / bowel preps. Continue strict Input and Output monitoring. Will monitor the patient closely with you and intervene or adjust therapy as indicated by changes in clinical status/labs  ?  ?Hypervolemia, acute on chronic dCHF exacerbation, ESRD ?-failed outpatient diuresis ?-UF as tolerated on HD ?  ?Bradycardia ?-appreciate recommendations from cardiology/EP, off dopamine gtt ?  ?Anemia due to chronic kidney disease: ?-Transfuse for Hgb<7 g/dL ?-giving iron load for a total of 1g w/ HD ?  ?Dialysis access ?-s/p Left BC AVF placed 04/23/21--maturing ?-s/p RIJ [facility] 05/13/21 ([facility]) ? ?Thrombocytopenia ?Mgmt per primary ? ?Hyperphosphatemia ?-Consider binder, phos improving. On renvela ? ? ? ?Subjective:   ?Patient seen and examined bedside this morning.  No acute events. Tolerated hd yesterday w/ net uf 3L  ? ?Objective:   ?BP (!) 123/58 (BP Location: Right Arm)   Pulse 62   Temp (!) 97.5 ?F (36.4 ?C) (Oral)   Resp 19   Ht 5\' 9"  (1.753 m)   Wt 97.8 kg   SpO2 95%   BMI 31.84 kg/m?  ? ?Intake/Output Summary (Last 24 hours) at 05/19/2021 0834 ?Last data filed at 05/18/2021 1656 ?Gross per 24 hour  ?Intake 240 ml  ?Output 3000 ml  ?Net -2760 ml  ? ?Weight change: 2.5 kg ? ?Physical Exam: ?Gen:nad ?ZHY:QMVHQIONGEX, no obvious rub ?Resp:  Bilateral chest rise with no increased work of breathing ?BMW:UXLKGMWNU but overall soft and nontender to palpation ?Ext:2+ pitting edema b/l LE's, warm and well perfused ?Neuro: awake, alert ?Dialysis access: RIJ TDC (with surrounding ecchymosis), LUE AVF +thrill (maturing) ? ?Imaging: ?No results found. ? ?Labs: ?BMET ?Recent Labs  ?Lab 05/13/21 ?2725 05/13/21 ?1040 05/13/21 ?1042 05/13/21 ?1203 05/13/21 ?1210 05/14/21 ?0119 05/15/21 ?0750 05/16/21 ?0105 05/17/21 ?3664 05/18/21 ?4034  ?NA 139   < > 136 139  --  136 136 135 134* 135  ?K 5.2*   < > 5.4* 5.0  --  4.6 4.6 3.9 4.3 3.6  ?CL 104  --  105 104  --  100 99 101 99 99  ?CO2 14*  --   --  16*  --  17* 20* 21* 23 26  ?GLUCOSE 134*  --  124* 120*  --  113* 99 102* 110* 109*  ?BUN 174*  --  >130* 173*  --  142* 113* 69* 79* 43*  ?CREATININE 8.91*  --  10.10* 9.16* 8.87* 8.09* 7.58* 5.70* 6.70* 4.67*  ?CALCIUM 8.1*  --   --  8.1*  --  8.5* 8.4*  8.2* 8.2* 8.1* 8.3*  ?PHOS  --   --   --  >30.0*  --  11.4*  --  6.9* 7.2* 5.0*  ? < > = values in this interval not displayed.  ? ?CBC ?Recent Labs  ?Lab 05/13/21 ?7425 05/13/21 ?1040 05/15/21 ?0750 05/16/21 ?0105 05/17/21 ?9563 05/18/21 ?8756  ?WBC 5.0   < >  6.8 6.0 5.3 5.9  ?NEUTROABS 4.3  --   --   --   --   --   ?HGB 10.1*   < > 10.7* 10.0* 8.6* 8.7*  ?HCT 29.9*   < > 30.7* 29.4* 25.7* 25.7*  ?MCV 93.7   < > 90.8 92.2 92.4 92.8  ?PLT 72*   < > 54* 40* 38* 44*  ? < > = values in this interval not displayed.  ? ? ?Medications:   ? ? aspirin EC  81 mg Oral Daily  ? bisacodyl  10 mg Rectal Daily  ? Chlorhexidine Gluconate Cloth  6 each Topical Q0600  ? ezetimibe  10 mg Oral Daily  ? hydrocortisone   Rectal TID  ? pantoprazole  40 mg Oral Daily  ? polyethylene glycol  17 g Oral Daily  ? rosuvastatin  10 mg Oral Daily  ? sevelamer carbonate  1,600 mg Oral TID WC  ? umeclidinium-vilanterol  1 puff Inhalation Daily  ? ? ? ? ?Peter Oneill ? ?[facility] ?05/19/2021, 8:34 AM  ? ?

## 2021-05-19 NOTE — Progress Notes (Signed)
Pt got discharged to home, discharge instructions provided and patient showed understanding to it, IV taken out,Telemonitor DC,pt left unit in wheelchair with all of the belongings accompanied with a family member (wife) 

## 2021-05-19 NOTE — Plan of Care (Signed)
?  Problem: Education: ?Goal: Knowledge of General Education information will improve ?Description: Including pain rating scale, medication(s)/side effects and non-pharmacologic comfort measures ?Outcome: Completed/Met ?  ?Problem: Health Behavior/Discharge Planning: ?Goal: Ability to manage health-related needs will improve ?Outcome: Completed/Met ?  ?Problem: Clinical Measurements: ?Goal: Ability to maintain clinical measurements within normal limits will improve ?Outcome: Completed/Met ?Goal: Will remain free from infection ?Outcome: Completed/Met ?Goal: Diagnostic test results will improve ?Outcome: Completed/Met ?Goal: Respiratory complications will improve ?Outcome: Completed/Met ?Goal: Cardiovascular complication will be avoided ?Outcome: Completed/Met ?  ?Problem: Activity: ?Goal: Risk for activity intolerance will decrease ?Outcome: Completed/Met ?  ?Problem: Nutrition: ?Goal: Adequate nutrition will be maintained ?Outcome: Completed/Met ?  ?Problem: Coping: ?Goal: Level of anxiety will decrease ?Outcome: Completed/Met ?  ?Problem: Elimination: ?Goal: Will not experience complications related to bowel motility ?Outcome: Completed/Met ?Goal: Will not experience complications related to urinary retention ?Outcome: Completed/Met ?  ?Problem: Pain Managment: ?Goal: General experience of comfort will improve ?Outcome: Completed/Met ?  ?Problem: Safety: ?Goal: Ability to remain free from injury will improve ?Outcome: Completed/Met ?  ?Problem: Skin Integrity: ?Goal: Risk for impaired skin integrity will decrease ?Outcome: Completed/Met ?  ?Problem: Acute Rehab OT Goals (only OT should resolve) ?Goal: Pt. Will Perform Grooming ?Outcome: Completed/Met ?Goal: Pt. Will Perform Upper Body Bathing ?Outcome: Completed/Met ?Goal: Pt. Will Perform Upper Body Dressing ?Outcome: Completed/Met ?Goal: Pt. Will Transfer To Toilet ?Outcome: Completed/Met ?Goal: Pt. Will Perform Toileting-Clothing Manipulation ?Outcome:  Completed/Met ?Goal: Pt/Caregiver Will Perform Home Exercise Program ?Outcome: Completed/Met ?  ?Problem: Acute Rehab PT Goals(only PT should resolve) ?Goal: Pt Will Go Supine/Side To Sit ?Outcome: Completed/Met ?Goal: Pt Will Go Sit To Supine/Side ?Outcome: Completed/Met ?Goal: Patient Will Transfer Sit To/From Stand ?Outcome: Completed/Met ?Goal: Pt Will Ambulate ?Outcome: Completed/Met ?Goal: Pt Will Go Up/Down Stairs ?Outcome: Completed/Met ?  ?

## 2021-05-19 NOTE — TOC Transition Note (Signed)
Transition of Care (TOC) - CM/SW Discharge Note ?Marvetta Gibbons Therapist, sports, BSN ?Transitions of Care ?Unit 4E- RN Case Manager ?See Treatment Team for direct phone #  ?Cross Coverage for 2C ? ?Patient Details  ?Name: Peter Oneill ?MRN: [id number] ?Date of Birth: 01/26/1941 ? ?Transition of Care (TOC) CM/SW Contact:  ?Dahlia Client, Romeo Rabon, RN ?Phone Number: ?05/19/2021, 2:51 PM ? ? ?Clinical Narrative:    ?Pt stable for transition home today, orders placed for HHPT/OT.  ?CM in to speak with pt- however pt in bathroom. Spoke with pt's wife- son also present in the room.  ?Wife agreeable to [facility] services- list provided for [facility] choice Per CMS guidelines from medicare.gov website with star ratings (copy placed in shadow chart) - after review of list [facility] selected as first choice- if unable to accept then wife does not have any further preference and will defer to this writer to secure an agency on pt's behalf.  ?Discussed DME needs- wife reports pt has walker at home, declines and further DME needs at this time. ? ?Address, phone # and PCP all confirmed with wife in epic. Family to transport home in private vehicle  ? ?Call made to [facility] for HHPT/OT referral- unable to accept at this time per [facility] ?Additional calls made to: ?[facility]- unable to accept per Alwyn Ren due to staffing ?[facility]- msg left- return call pending ?[facility] - msg left- return call pending ?[facility]- referral has been accepted- per Sharmon Revere for PT/OT needs.  ? ?1500- call made to wife to let her know updated [place] arrangements and provide contact info for [facility] liaison- [phone] ? ? ? ? ? ? ?Final next level of care: [place] ?Barriers to Discharge: No Barriers Identified ? ? ?Patient Goals and CMS Choice ?Patient states their goals for this hospitalization and ongoing recovery are:: return home ?CMS Medicare.gov Compare Post Acute Care list provided to:: Patient ?Choice offered to / list presented to : Patient,  Spouse ? ?Discharge Placement ?  ?           ? Home w/ HH ?  ?  ?  ? ?Discharge Plan and Services ?  ?Discharge Planning Services: CM Consult ?Post Acute Care Choice: Home Health          ?DME Arranged: N/A ?DME Agency: NA ?  ?  ?  ?HH Arranged: PT, OT ?[address] Agency: [facility] ?Date HH Agency Contacted: 05/19/21 ?Time [place]: 1020 ?Representative spoke with at [place]: Anderson Malta ? ?Social Determinants of Health (SDOH) Interventions ?  ? ? ?Readmission Risk Interventions ? ?  05/19/2021  ? 10:24 AM  ?Readmission Risk Prevention Plan  ?Transportation Screening Complete  ?PCP or Specialist Appt within 3-5 Days Complete  ?[place] or Home Care Consult Complete  ?Social Work Consult for [place] Planning/Counseling Complete  ?Palliative Care Screening Complete  ?Medication Review Press photographer) Complete  ? ? ? ? ? ?

## 2021-05-20 ENCOUNTER — Encounter: Payer: Self-pay | Admitting: Interventional Cardiology

## 2021-05-20 NOTE — Telephone Encounter (Signed)
I spoke with patient's wife and gave her information from Dr Irish Lack.  Patient is starting dialysis tomorrow.  Wife will check on having CBC drawn there in a week.  She will let us know if order needed. ?Wife reports patient is not having any bleeding.  ?

## 2021-05-20 NOTE — Progress Notes (Signed)
Late Entry Note: ? ?Pt was d/c to  home yesterday. Contacted [facility] and spoke to [place]. Clinic aware pt will start tomorrow. Elberta Fortis to contact navigator if orders were not received.  ? ?Melven Sartorius ?Renal Navigator ?[phone] ?

## 2021-05-20 NOTE — Telephone Encounter (Signed)
Platelets have been low.  COntinue to hold aspirin and plavix for now.  Recheck CBC with dialysis in 1 week.  If platelets increased, could consider adding back clopidogrel.  Has he had any blkeeding problems with the low platelets?  ? ?JV  ?

## 2021-05-23 ENCOUNTER — Other Ambulatory Visit: Payer: Self-pay | Admitting: *Deleted

## 2021-05-23 DIAGNOSIS — N184 Chronic kidney disease, stage 4 (severe): Secondary | ICD-10-CM

## 2021-05-24 ENCOUNTER — Ambulatory Visit (HOSPITAL_COMMUNITY)
Admission: RE | Admit: 2021-05-24 | Discharge: 2021-05-24 | Disposition: A | Discharge status: Home / Self Care | Payer: Medicare HMO | Source: Ambulatory Visit | Attending: Vascular Surgery | Admitting: Vascular Surgery

## 2021-05-24 DIAGNOSIS — N184 Chronic kidney disease, stage 4 (severe): Secondary | ICD-10-CM | POA: Insufficient documentation

## 2021-05-27 ENCOUNTER — Encounter: Payer: Self-pay | Admitting: Vascular Surgery

## 2021-05-27 ENCOUNTER — Ambulatory Visit (INDEPENDENT_AMBULATORY_CARE_PROVIDER_SITE_OTHER): Payer: Medicare HMO | Admitting: Vascular Surgery

## 2021-05-27 VITALS — BP 149/64 | HR 61 | Temp 98.2°F | Resp 18 | Ht 69.0 in | Wt 213.0 lb

## 2021-05-27 DIAGNOSIS — N186 End stage renal disease: Secondary | ICD-10-CM

## 2021-05-27 DIAGNOSIS — Z992 Dependence on renal dialysis: Secondary | ICD-10-CM

## 2021-05-27 NOTE — Progress Notes (Signed)
?Office Note  ? ? ? ?CC:  ESRD ?Requesting Provider:  [name], MD ? ?HPI: Peter Oneill is a Right handed 81 y.o. (Aug 23, 1940) male with kidney disease who presents in follow-up after 04/23/2021 left-sided brachiocephalic fistula creation.  Since that time, Peter Oneill had a tunneled dialysis catheter placed with dialysis initiated. ? ?Presents today accompanied by his wife.  He had no complaints.  Denies symptoms associated with steal syndrome.  Denies wound healing concerns. ? ?The pt is  on a statin for cholesterol management.  ?The pt is  on a daily aspirin.   Other AC:  plavix ?The pt is  on medications for hypertension.   ?The pt is not diabetic. ?Tobacco hx:  - ? ?Past Medical History:  ?Diagnosis Date  ? Asbestosis ([place])   ? Contraindication to percutaneous coronary intervention (PCI)   ? Coronary artery disease   ? Dyslipidemia   ? Dyspnea   ? History of anemia   ? Hypercholesterolemia   ? Hypertension   ? Ileus ([place])   ? Pleural effusion on left   ? Pneumonia   ? Renal failure   ? Sleep apnea   ? ? ?Past Surgical History:  ?Procedure Laterality Date  ? AV FISTULA PLACEMENT Left 04/23/2021  ? Procedure: LEFT ARM  FISTULA CREATION;  Surgeon: Broadus John, MD;  Location: [facility] OR;  Service: Vascular;  Laterality: Left;  PERIPHERAL NERVE BLOCK  ? BACK SURGERY    ? CARDIAC CATHETERIZATION    ? CAROTID ENDARTERECTOMY    ? CARPAL TUNNEL RELEASE    ? CORONARY ARTERY BYPASS GRAFT    ? HERNIA REPAIR    ? PERCUTANEOUS CORONARY STENT INTERVENTION (PCI-S)    ? multiple  ? STERNOTOMY    ? ? ?Social History  ? ?Socioeconomic History  ? Marital status: Married  ?  Spouse name: Not on file  ? Number of children: Not on file  ? Years of education: Not on file  ? Highest education level: Not on file  ?Occupational History  ? Not on file  ?Tobacco Use  ? Smoking status: Former  ?  Packs/day: 0.25  ?  Years: 19.00  ?  Pack years: 4.75  ?  Types: Cigarettes  ?  Quit date: 12/10/1976  ?  Years since quitting: 44.4  ? Smokeless  tobacco: Never  ?Vaping Use  ? Vaping Use: Never used  ?Substance and Sexual Activity  ? Alcohol use: Yes  ?  Comment: occasional  ? Drug use: No  ? Sexual activity: Yes  ?  Comment: married  ?Other Topics Concern  ? Not on file  ?Social History Narrative  ? Not on file  ? ?Social Determinants of Health  ? ?Financial Resource Strain: Not on file  ?Food Insecurity: Not on file  ?Transportation Needs: Not on file  ?Physical Activity: Not on file  ?Stress: Not on file  ?Social Connections: Not on file  ?Intimate Partner Violence: Not on file  ? ?Family History  ?Problem Relation Age of Onset  ? Heart disease Father   ? ? ?Current Outpatient Medications  ?Medication Sig Dispense Refill  ? allopurinol (ZYLOPRIM) 100 MG tablet Take 100 mg by mouth daily.    ? amLODipine (NORVASC) 5 MG tablet Take 1 tablet (5 mg total) by mouth daily. 30 tablet 3  ? ANORO ELLIPTA 62.5-25 MCG/ACT AEPB Inhale 1 puff into the lungs daily. USE 1 INHALATION ONCE DAILY (Patient taking differently: Inhale 1 puff into the lungs daily.) 60 each  5  ? ezetimibe (ZETIA) 10 MG tablet Take 10 mg by mouth in the morning.    ? ferrous sulfate 325 (65 FE) MG EC tablet Take 1 tablet (325 mg total) by mouth 2 (two) times daily. 60 tablet 3  ? hydrocortisone (ANUSOL-HC) 25 MG suppository Place 25 mg rectally daily as needed for hemorrhoids or anal itching.    ? latanoprost (XALATAN) 0.005 % ophthalmic solution Place 1 drop into both eyes at bedtime.    ? Multiple Vitamin (MULTIVITAMIN) tablet Take 1 tablet by mouth daily.    ? nitroGLYCERIN (NITROSTAT) 0.4 MG SL tablet Place 1 tablet (0.4 mg total) under the tongue every 5 (five) minutes as needed for chest pain. 25 tablet 3  ? Omega-3 Fatty Acids (FISH OIL PO) Take 2,000 mg by mouth daily.    ? rosuvastatin (CRESTOR) 10 MG tablet Take 10 mg by mouth daily.    ? sevelamer carbonate (RENVELA) 800 MG tablet Take 2 tablets (1,600 mg total) by mouth 3 (three) times daily with meals. 180 tablet 3  ? silodosin  (RAPAFLO) 4 MG CAPS capsule Take 4 mg by mouth daily with breakfast.    ? HYDROcodone-acetaminophen (NORCO) 5-325 MG tablet Take 1 tablet by mouth every 6 (six) hours as needed for moderate pain. (Patient not taking: Reported on 05/27/2021) 10 tablet 0  ? ?No current facility-administered medications for this visit.  ? ? ?Allergies  ?Allergen Reactions  ? Atorvastatin Other (See Comments)  ?  Muscle aches  ? ? ? ?REVIEW OF SYSTEMS:  ?[X]  denotes positive finding, [ ]  denotes negative finding ?Cardiac  Comments:  ?Chest pain or chest pressure:    ?Shortness of breath upon exertion:    ?Short of breath when lying flat:    ?Irregular heart rhythm:    ?    ?Vascular    ?Pain in calf, thigh, or hip brought on by ambulation:    ?Pain in feet at night that wakes you up from your sleep:     ?Blood clot in your veins:    ?Leg swelling:     ?    ?Pulmonary    ?Oxygen at home:    ?Productive cough:     ?Wheezing:     ?    ?Neurologic    ?Sudden weakness in arms or legs:     ?Sudden numbness in arms or legs:     ?Sudden onset of difficulty speaking or slurred speech:    ?Temporary loss of vision in one eye:     ?Problems with dizziness:     ?    ?Gastrointestinal    ?Blood in stool:     ?Vomited blood:     ?    ?Genitourinary    ?Burning when urinating:     ?Blood in urine:    ?    ?Psychiatric    ?Major depression:     ?    ?Hematologic    ?Bleeding problems:    ?Problems with blood clotting too easily:    ?    ?Skin    ?Rashes or ulcers:    ?    ?Constitutional    ?Fever or chills:    ? ? ?PHYSICAL EXAMINATION: ? ?Vitals:  ? 05/27/21 1115  ?BP: (!) 149/64  ?Pulse: 61  ?Resp: 18  ?Temp: 98.2 ?F (36.8 ?C)  ?TempSrc: Temporal  ?SpO2: 95%  ?Weight: 213 lb (96.6 kg)  ?Height: 5\' 9"  (1.753 m)  ? ? ?General:  WDWN in NAD;  vital signs documented above ?Gait: Not observed ?HENT: WNL, normocephalic ?Pulmonary: normal non-labored breathing , without Rales, rhonchi,  wheezing ?Cardiac: regular HR ?Abdomen: soft, NT, no masses ?Skin:  without rashes ?Vascular Exam/Pulses: ? Right Left  ?Radial 2+ (normal) 2+ (normal)  ?    ?    ?    ?    ?    ? ?Extremities: without ischemic changes, without Gangrene , without cellulitis; without open wounds;  ?Musculoskeletal: no muscle wasting or atrophy  ?Neurologic: A&O X 3;  No focal weakness or paresthesias are detected ?Psychiatric:  The pt has Normal affect. ? ? ?Non-Invasive Vascular Imaging:   ? ?+------------+----------+-------------+----------+---------------+  ?OUTFLOW VEINPSV (cm/s)Diameter (cm)Depth (cm)   Describe      ?+------------+----------+-------------+----------+---------------+  ?Prox UA        177        0.58        1.18                    ?+------------+----------+-------------+----------+---------------+  ?Mid UA         151        0.63        0.30   branch 0.253 cm  ?+------------+----------+-------------+----------+---------------+  ?Dist UA        130        0.62        0.27                    ?+------------+----------+-------------+----------+---------------+  ?AC Fossa       277        0.82        0.22   branch 0.421 cm  ? ? ? ?ASSESSMENT/PLAN: ? ?Christianjames Soule is a 81 y.o. male who presents s/p/28/23 left brachiocephalic fistula creation.  Since surgery, Clerence's been doing well, he has no complaints.  He denies symptoms associated with steal syndrome.  2+ pulse in the left wrist, well-healed incision. ?Noninvasive duplex ultrasonography of his fistula demonstrated adequate flows, adequate size, and superficial location.   ? ?Recommend attempting access at this time. ? ? ?Broadus John, MD ?[facility] ?[phone] ? ?

## 2021-05-31 ENCOUNTER — Encounter (HOSPITAL_COMMUNITY): Payer: Medicare HMO

## 2021-06-03 ENCOUNTER — Telehealth: Payer: Self-pay

## 2021-06-03 NOTE — Telephone Encounter (Signed)
Pt called to report L arm discomfort and bruising post dialysis treatment on Tuesday 4/4. He stated that when the tech stuck him it was painful . He also stated that after the treatment there was bruising and some pain. He stated that the clinic staff at the center did not mention him needing to be seen by vascular. Pt denied pain in fingers or hand, fever/chills, SOB, pt agreed that sx's are improving. Advised pt to continue to ice area as tolerated and keep his L arm elevated above the level of his heart, also advised if pain increases significantly and or he develops S/S of infection he should give our office a call or go the nearest ED or urgent care - made f/u call to dialysis center on [facility], s/w nurse Elberta Fortis who confirmed that pt was able to run a full tx on Tuesday and that bruising was more significant on Thursday but did not appear to warrant a ref to [place]. Stated that on Saturday it looked much better. Asked if he could eval on tomorrow and give our office a call if an appt is needed. Pt and Elberta Fortis bverbalized understanding of the individual instructions and agreed with the plan.  ?

## 2021-06-12 ENCOUNTER — Telehealth: Payer: Self-pay | Admitting: *Deleted

## 2021-06-12 NOTE — Telephone Encounter (Signed)
Patient was to have CBC drawn in early April at dialysis.   See my chart message from 05/20/21.  Results not in chart.  I spoke with patient's wife who reports patient had CBC done at dialysis on 4/13.  She reports Platelets were 144, Hct was 29.2 and Hgb was 9.0.  Will call dialysis center on [address] to request results.  ?Patient is still holding ASA and Plavix ?

## 2021-06-12 NOTE — Telephone Encounter (Signed)
Lab work has been received and sent to medical records to scan into chart ?

## 2021-06-20 ENCOUNTER — Ambulatory Visit: Payer: Medicare HMO | Admitting: Student

## 2021-06-20 NOTE — Telephone Encounter (Signed)
Lab work has been scanned into chart ?

## 2021-06-21 ENCOUNTER — Encounter: Payer: Self-pay | Admitting: Student

## 2021-06-21 ENCOUNTER — Ambulatory Visit: Payer: Medicare HMO | Admitting: Student

## 2021-06-21 VITALS — BP 148/64 | HR 70 | Ht 69.0 in | Wt 211.0 lb

## 2021-06-21 DIAGNOSIS — I25119 Atherosclerotic heart disease of native coronary artery with unspecified angina pectoris: Secondary | ICD-10-CM

## 2021-06-21 DIAGNOSIS — N186 End stage renal disease: Secondary | ICD-10-CM

## 2021-06-21 DIAGNOSIS — I10 Essential (primary) hypertension: Secondary | ICD-10-CM | POA: Diagnosis not present

## 2021-06-21 MED ORDER — CLOPIDOGREL BISULFATE 75 MG PO TABS: 75.0000 mg | ORAL_TABLET | Freq: Every day | ORAL | 3 refills | Status: DC

## 2021-06-21 NOTE — Telephone Encounter (Signed)
Patient's wife notified.  Prescription sent to [facility].  Wife will make dialysis center aware and ask them to fax results to our office.  ?

## 2021-06-21 NOTE — Patient Instructions (Signed)
Medication Instructions:  °Your physician recommends that you continue on your current medications as directed. Please refer to the Current Medication list given to you today. ° °*If you need a refill on your cardiac medications before your next appointment, please call your pharmacy* ° ° °Lab Work: °None °If you have labs (blood work) drawn today and your tests are completely normal, you will receive your results only by: °MyChart Message (if you have MyChart) OR °A paper copy in the mail °If you have any lab test that is abnormal or we need to change your treatment, we will call you to review the results. ° ° °Follow-Up: °At [facility], you and your health needs are our priority.  As part of our continuing mission to provide you with exceptional heart care, we have created designated Provider Care Teams.  These Care Teams include your primary Cardiologist (physician) and Advanced Practice Providers (APPs -  Physician Assistants and Nurse Practitioners) who all work together to provide you with the care you need, when you need it. ° ° °Your next appointment:   °As needed °

## 2021-06-21 NOTE — Progress Notes (Signed)
? ?PCP:  [name], MD ?Primary Cardiologist: Larae Grooms, MD ?Electrophysiologist: Vickie Epley, MD  ? ?Peter Oneill is a 81 y.o. male seen today for Vickie Epley, MD for post hospital follow up. .   ? ?Pt admitted 3/20 - 05/19/2021 with need for urgent dialysis after left fistula malfunctioned and in the setting of marked volume overload. He was also noted to have transient bradycardia on his home coreg, initially responding to atropine, then dopamine. As he underwent multiple days of HD, his HRs improved and stabilized. Determined to not currently have indication for pacing, and be poor candidate overall.  ? ?Since discharge from hospital, pt reports doing OK from a cardiac perspective.  ? ?He denies syncope, dizziness, or lightheadedness. Overall tolerating HD well. Denies recurrent bradycardia. States his Platelets and Hgb are trending upwards.  ? ?Past Medical History:  ?Diagnosis Date  ? Asbestosis ([place])   ? Contraindication to percutaneous coronary intervention (PCI)   ? Coronary artery disease   ? Dyslipidemia   ? Dyspnea   ? History of anemia   ? Hypercholesterolemia   ? Hypertension   ? Ileus ([place])   ? Pleural effusion on left   ? Pneumonia   ? Renal failure   ? Sleep apnea   ? ?Past Surgical History:  ?Procedure Laterality Date  ? AV FISTULA PLACEMENT Left 04/23/2021  ? Procedure: LEFT ARM  FISTULA CREATION;  Surgeon: Broadus John, MD;  Location: [facility] OR;  Service: Vascular;  Laterality: Left;  PERIPHERAL NERVE BLOCK  ? BACK SURGERY    ? CARDIAC CATHETERIZATION    ? CAROTID ENDARTERECTOMY    ? CARPAL TUNNEL RELEASE    ? CORONARY ARTERY BYPASS GRAFT    ? HERNIA REPAIR    ? PERCUTANEOUS CORONARY STENT INTERVENTION (PCI-S)    ? multiple  ? STERNOTOMY    ? ? ?Current Outpatient Medications  ?Medication Sig Dispense Refill  ? allopurinol (ZYLOPRIM) 100 MG tablet Take 100 mg by mouth daily.    ? amLODipine (NORVASC) 5 MG tablet Take 1 tablet (5 mg total) by mouth daily. 30 tablet 3  ?  ANORO ELLIPTA 62.5-25 MCG/ACT AEPB Inhale 1 puff into the lungs daily. USE 1 INHALATION ONCE DAILY (Patient taking differently: Inhale 1 puff into the lungs daily.) 60 each 5  ? ezetimibe (ZETIA) 10 MG tablet Take 10 mg by mouth in the morning.    ? ferrous sulfate 325 (65 FE) MG EC tablet Take 1 tablet (325 mg total) by mouth 2 (two) times daily. 60 tablet 3  ? hydrocortisone (ANUSOL-HC) 25 MG suppository Place 25 mg rectally daily as needed for hemorrhoids or anal itching.    ? latanoprost (XALATAN) 0.005 % ophthalmic solution Place 1 drop into both eyes at bedtime.    ? Multiple Vitamin (MULTIVITAMIN) tablet Take 1 tablet by mouth daily.    ? nitroGLYCERIN (NITROSTAT) 0.4 MG SL tablet Place 1 tablet (0.4 mg total) under the tongue every 5 (five) minutes as needed for chest pain. 25 tablet 3  ? Omega-3 Fatty Acids (FISH OIL PO) Take 2,000 mg by mouth daily.    ? rosuvastatin (CRESTOR) 10 MG tablet Take 10 mg by mouth daily.    ? sevelamer carbonate (RENVELA) 800 MG tablet Take 2 tablets (1,600 mg total) by mouth 3 (three) times daily with meals. 180 tablet 3  ? silodosin (RAPAFLO) 4 MG CAPS capsule Take 4 mg by mouth daily with breakfast.    ? ?No current facility-administered  medications for this visit.  ? ? ?Allergies  ?Allergen Reactions  ? Atorvastatin Other (See Comments)  ?  Muscle aches  ? ? ?Social History  ? ?Socioeconomic History  ? Marital status: Married  ?  Spouse name: Not on file  ? Number of children: Not on file  ? Years of education: Not on file  ? Highest education level: Not on file  ?Occupational History  ? Not on file  ?Tobacco Use  ? Smoking status: Former  ?  Packs/day: 0.25  ?  Years: 19.00  ?  Pack years: 4.75  ?  Types: Cigarettes  ?  Quit date: 12/10/1976  ?  Years since quitting: 44.5  ? Smokeless tobacco: Never  ?Vaping Use  ? Vaping Use: Never used  ?Substance and Sexual Activity  ? Alcohol use: Yes  ?  Comment: occasional  ? Drug use: No  ? Sexual activity: Yes  ?  Comment: married   ?Other Topics Concern  ? Not on file  ?Social History Narrative  ? Not on file  ? ?Social Determinants of Health  ? ?Financial Resource Strain: Not on file  ?Food Insecurity: Not on file  ?Transportation Needs: Not on file  ?Physical Activity: Not on file  ?Stress: Not on file  ?Social Connections: Not on file  ?Intimate Partner Violence: Not on file  ? ? ? ?Review of Systems: ?All other systems reviewed and are otherwise negative except as noted above. ? ?Physical Exam: ?Vitals:  ? 06/21/21 1043  ?BP: (!) 148/64  ?Pulse: 70  ?SpO2: 100%  ?Weight: 211 lb (95.7 kg)  ?Height: 5\' 9"  (1.753 m)  ? ? ?GEN- The patient is well appearing, alert and oriented x 3 today.   ?HEENT: normocephalic, atraumatic; sclera clear, conjunctiva pink; hearing intact; oropharynx clear; neck supple, no JVP ?Lymph- no cervical lymphadenopathy ?Lungs- Clear to ausculation bilaterally, normal work of breathing.  No wheezes, rales, rhonchi ?Heart- Regular rate and rhythm, no murmurs, rubs or gallops, PMI not laterally displaced ?GI- soft, non-tender, non-distended, bowel sounds present, no hepatosplenomegaly ?Extremities- no clubbing, cyanosis, or edema; DP/PT/radial pulses 2+ bilaterally ?MS- no significant deformity or atrophy ?Skin- warm and dry, no rash or lesion ?Psych- euthymic mood, full affect ?Neuro- strength and sensation are intact ? ?EKG is ordered. Personal review of EKG from today shows NSR in 70s ? ?Additional studies reviewed include: ?Previous EP office notes.  ? ?Assessment and Plan: ? ?1.  Bradycardia / Sinus Node Dysfunction ?EKG today shows NSR with stable rate.  ?Avoid AV nodal agents ?In hospital he was sinus brady / NSR 50-60s, rarely dipping into upper 40s.  ?He has Left sided fistula, R sided temp cath. He is not currently a candidate for transvenous pacing.  ?He would prefer to avoid any additional procedures if possible, and was made a DNR during recent admission ?It is not felt a pacemaker will significant change  his trajectory, especially with current HRs in 50-60s.  ?Hopefully, as he continues to stabilize from dialysis, his HRs will remain stable.  ?  ?2. ESRD ?On HD  ?  ?3. Thrombocytopenia ?Most recent CBC with Hgb 9.0 and 144k platelets on 4/18 (HD) ?Will ask Dr. Irish Lack about plavix.  ?  ?4. CAD ?Patient has a history of extensive CAD s/p CABG in 2003 and redo CABG x2 in 01/2015 with multiple PCIs as well most recently in 2017. ?Denies s/s ischemia ?He is off ASA and plavix with thrombocytopenia and anemia.  ? ?5. H/o restrictive lung  disease due to asbestos exposure ?Per primary.  ?Also with h/o OSA and COPD.  ?  ?Follow up with  EP as needed ? ?Shirley Friar, PA-C  ?06/21/21 ?11:42 AM  ?

## 2021-06-21 NOTE — Telephone Encounter (Signed)
Restart Plavix.  No aspirin.  CBC weekly at dialysis for 3 weeks.  ? ?JV ?

## 2021-06-26 ENCOUNTER — Encounter: Payer: Self-pay | Admitting: Vascular Surgery

## 2021-06-27 ENCOUNTER — Encounter: Payer: Self-pay | Admitting: Interventional Cardiology

## 2021-06-27 DIAGNOSIS — I10 Essential (primary) hypertension: Secondary | ICD-10-CM

## 2021-06-27 DIAGNOSIS — I25119 Atherosclerotic heart disease of native coronary artery with unspecified angina pectoris: Secondary | ICD-10-CM

## 2021-06-28 ENCOUNTER — Other Ambulatory Visit: Payer: Medicare HMO | Admitting: *Deleted

## 2021-06-28 DIAGNOSIS — I10 Essential (primary) hypertension: Secondary | ICD-10-CM

## 2021-06-28 DIAGNOSIS — I25119 Atherosclerotic heart disease of native coronary artery with unspecified angina pectoris: Secondary | ICD-10-CM

## 2021-06-29 LAB — CBC
Hematocrit: 32.5 % — ABNORMAL LOW (ref 37.5–51.0)
Hemoglobin: 10.8 g/dL — ABNORMAL LOW (ref 13.0–17.7)
MCH: 31.8 pg (ref 26.6–33.0)
MCHC: 33.2 g/dL (ref 31.5–35.7)
MCV: 96 fL (ref 79–97)
Platelets: 166 10*3/uL (ref 150–450)
RBC: 3.4 x10E6/uL — ABNORMAL LOW (ref 4.14–5.80)
RDW: 15.6 % — ABNORMAL HIGH (ref 11.6–15.4)
WBC: 6 10*3/uL (ref 3.4–10.8)

## 2021-07-02 DIAGNOSIS — D696 Thrombocytopenia, unspecified: Secondary | ICD-10-CM

## 2021-07-10 ENCOUNTER — Other Ambulatory Visit: Payer: Self-pay | Admitting: *Deleted

## 2021-07-10 DIAGNOSIS — N186 End stage renal disease: Secondary | ICD-10-CM

## 2021-07-12 ENCOUNTER — Other Ambulatory Visit: Payer: Medicare HMO | Admitting: *Deleted

## 2021-07-12 DIAGNOSIS — D696 Thrombocytopenia, unspecified: Secondary | ICD-10-CM

## 2021-07-12 LAB — CBC
Hematocrit: 33.5 % — ABNORMAL LOW (ref 37.5–51.0)
Hemoglobin: 11.4 g/dL — ABNORMAL LOW (ref 13.0–17.7)
MCH: 30.6 pg (ref 26.6–33.0)
MCHC: 34 g/dL (ref 31.5–35.7)
MCV: 90 fL (ref 79–97)
Platelets: 212 10*3/uL (ref 150–450)
RBC: 3.72 x10E6/uL — ABNORMAL LOW (ref 4.14–5.80)
RDW: 15.8 % — ABNORMAL HIGH (ref 11.6–15.4)
WBC: 5.5 10*3/uL (ref 3.4–10.8)

## 2021-07-15 ENCOUNTER — Encounter: Payer: Self-pay | Admitting: Surgery

## 2021-07-15 ENCOUNTER — Ambulatory Visit (INDEPENDENT_AMBULATORY_CARE_PROVIDER_SITE_OTHER): Payer: Medicare HMO | Admitting: Surgery

## 2021-07-15 ENCOUNTER — Other Ambulatory Visit: Payer: Self-pay

## 2021-07-15 ENCOUNTER — Ambulatory Visit (HOSPITAL_COMMUNITY)
Admission: RE | Admit: 2021-07-15 | Discharge: 2021-07-15 | Disposition: A | Discharge status: Home / Self Care | Payer: Medicare HMO | Source: Ambulatory Visit | Attending: Surgery | Admitting: Surgery

## 2021-07-15 VITALS — BP 167/62 | HR 81 | Temp 98.0°F | Resp 20 | Ht 69.0 in | Wt 207.6 lb

## 2021-07-15 DIAGNOSIS — N186 End stage renal disease: Secondary | ICD-10-CM | POA: Insufficient documentation

## 2021-07-15 DIAGNOSIS — Z992 Dependence on renal dialysis: Secondary | ICD-10-CM

## 2021-07-15 NOTE — Progress Notes (Signed)
Patient name: Peter Oneill MRN: [id number] DOB: November 03, 1940 Sex: male  REASON FOR VISIT:     Follow up  HISTORY OF PRESENT ILLNESS:   Peter Oneill is a 81 y.o. male who is status post left brachiocephalic fistula creation on 04/23/2021 by Dr. Unk Lightning.  They have been trying to use it at the dialysis center but have been unsuccessful.  He developed a large hematoma in his arm and has gone back to using the catheter.  CURRENT MEDICATIONS:    Current Outpatient Medications  Medication Sig Dispense Refill   allopurinol (ZYLOPRIM) 100 MG tablet Take 100 mg by mouth daily.     amLODipine (NORVASC) 5 MG tablet Take 1 tablet (5 mg total) by mouth daily. 30 tablet 3   ANORO ELLIPTA 62.5-25 MCG/ACT AEPB Inhale 1 puff into the lungs daily. USE 1 INHALATION ONCE DAILY (Patient taking differently: Inhale 1 puff into the lungs daily.) 60 each 5   clopidogrel (PLAVIX) 75 MG tablet Take 1 tablet (75 mg total) by mouth daily. 90 tablet 3   ezetimibe (ZETIA) 10 MG tablet Take 10 mg by mouth in the morning.     ferrous sulfate 325 (65 FE) MG EC tablet Take 1 tablet (325 mg total) by mouth 2 (two) times daily. 60 tablet 3   hydrocortisone (ANUSOL-HC) 25 MG suppository Place 25 mg rectally daily as needed for hemorrhoids or anal itching.     latanoprost (XALATAN) 0.005 % ophthalmic solution Place 1 drop into both eyes at bedtime.     Multiple Vitamin (MULTIVITAMIN) tablet Take 1 tablet by mouth daily.     nitroGLYCERIN (NITROSTAT) 0.4 MG SL tablet Place 1 tablet (0.4 mg total) under the tongue every 5 (five) minutes as needed for chest pain. 25 tablet 3   Omega-3 Fatty Acids (FISH OIL PO) Take 2,000 mg by mouth daily.     rosuvastatin (CRESTOR) 10 MG tablet Take 10 mg by mouth daily.     sevelamer carbonate (RENVELA) 800 MG tablet Take 2 tablets (1,600 mg total) by mouth 3 (three) times daily with meals. 180 tablet 3   silodosin (RAPAFLO) 4 MG CAPS capsule Take 4 mg by  mouth daily with breakfast.     No current facility-administered medications for this visit.    REVIEW OF SYSTEMS:   [X]  denotes positive finding, [ ]  denotes negative finding Cardiac  Comments:  Chest pain or chest pressure:    Shortness of breath upon exertion:    Short of breath when lying flat:    Irregular heart rhythm:    Constitutional    Fever or chills:      PHYSICAL EXAM:   Vitals:   07/15/21 1128  BP: (!) 167/62  Pulse: 81  Resp: 20  Temp: 98 F (36.7 C)  SpO2: 95%  Weight: 207 lb 9.6 oz (94.2 kg)  Height: 5\' 9"  (1.753 m)    GENERAL: The patient is a well-nourished male, in no acute distress. The vital signs are documented above. CARDIOVASCULAR: There is a regular rate and rhythm. PULMONARY: Non-labored respirations SonoSite was used to evaluate the fistula.  It appears to be of adequate diameter.  There are some branches however they do not appear to be significant.  The vein does not appear to be too superficial and has an excellent thrill  STUDIES:    +------------+----------+-------------+----------+----------------+  OUTFLOW VEINPSV (cm/s)Diameter (cm)Depth (cm)    Describe      +------------+----------+-------------+----------+----------------+  Prox UA  223        0.67        0.84                     +------------+----------+-------------+----------+----------------+  Mid UA         246        0.68        0.13   competing branch  +------------+----------+-------------+----------+----------------+  Dist UA        187        0.65        0.10   competing branch  +------------+----------+-------------+----------+----------------+  AC Fossa       144        0.96        0.13                     +------------+----------+-------------+----------+----------------+       MEDICAL ISSUES:   The patient is having difficulty using his fistula.  I discussed with the family that the neck step is to proceed with a fistulogram  to better evaluate the fistula and make future recommendations.  This will be scheduled with Dr. Unk Lightning per the family request.  Annamarie Major, IV, MD, FACS [facility] [phone] Pager [phone]

## 2021-07-17 ENCOUNTER — Encounter (HOSPITAL_COMMUNITY)
Admission: RE | Disposition: A | Discharge status: Home / Self Care | Payer: Self-pay | Source: Home / Self Care | Attending: Vascular Surgery

## 2021-07-17 ENCOUNTER — Ambulatory Visit (HOSPITAL_COMMUNITY)
Admission: RE | Admit: 2021-07-17 | Discharge: 2021-07-17 | Disposition: A | Discharge status: Home / Self Care | Payer: Medicare HMO | Attending: Vascular Surgery | Admitting: Vascular Surgery

## 2021-07-17 DIAGNOSIS — T829XXA Unspecified complication of cardiac and vascular prosthetic device, implant and graft, initial encounter: Secondary | ICD-10-CM | POA: Insufficient documentation

## 2021-07-17 DIAGNOSIS — N186 End stage renal disease: Secondary | ICD-10-CM

## 2021-07-17 DIAGNOSIS — Y832 Surgical operation with anastomosis, bypass or graft as the cause of abnormal reaction of the patient, or of later complication, without mention of misadventure at the time of the procedure: Secondary | ICD-10-CM | POA: Insufficient documentation

## 2021-07-17 DIAGNOSIS — Z992 Dependence on renal dialysis: Secondary | ICD-10-CM | POA: Insufficient documentation

## 2021-07-17 DIAGNOSIS — N185 Chronic kidney disease, stage 5: Secondary | ICD-10-CM

## 2021-07-17 HISTORY — PX: A/V FISTULAGRAM: CATH118298

## 2021-07-17 LAB — POCT I-STAT, CHEM 8
BUN: 26 mg/dL — ABNORMAL HIGH (ref 8–23)
Calcium, Ion: 1.07 mmol/L — ABNORMAL LOW (ref 1.15–1.40)
Chloride: 96 mmol/L — ABNORMAL LOW (ref 98–111)
Creatinine, Ser: 3.1 mg/dL — ABNORMAL HIGH (ref 0.61–1.24)
Glucose, Bld: 86 mg/dL (ref 70–99)
HCT: 39 % (ref 39.0–52.0)
Hemoglobin: 13.3 g/dL (ref 13.0–17.0)
Potassium: 3.9 mmol/L (ref 3.5–5.1)
Sodium: 135 mmol/L (ref 135–145)
TCO2: 29 mmol/L (ref 22–32)

## 2021-07-17 SURGERY — A/V FISTULAGRAM
Anesthesia: LOCAL | Laterality: Left

## 2021-07-17 MED ORDER — HEPARIN (PORCINE) IN NACL 1000-0.9 UT/500ML-% IV SOLN
INTRAVENOUS | Status: AC
  Filled 2021-07-17: qty 500

## 2021-07-17 MED ORDER — SODIUM CHLORIDE 0.9% FLUSH: 3.0000 mL | INTRAVENOUS | Status: DC | PRN

## 2021-07-17 MED ORDER — SODIUM CHLORIDE 0.9 % IV SOLN: 250.0000 mL | INTRAVENOUS | Status: DC | PRN

## 2021-07-17 MED ORDER — LIDOCAINE HCL (PF) 1 % IJ SOLN
INTRAMUSCULAR | Status: AC
  Filled 2021-07-17: qty 30

## 2021-07-17 MED ORDER — SODIUM CHLORIDE 0.9% FLUSH: 3.0000 mL | Freq: Two times a day (BID) | INTRAVENOUS | Status: DC

## 2021-07-17 MED ORDER — LIDOCAINE HCL (PF) 1 % IJ SOLN
INTRAMUSCULAR | Status: DC | PRN
  Administered 2021-07-17: 2 mL

## 2021-07-17 MED ORDER — HEPARIN (PORCINE) IN NACL 1000-0.9 UT/500ML-% IV SOLN
INTRAVENOUS | Status: DC | PRN
  Administered 2021-07-17: 500 mL

## 2021-07-17 MED ORDER — IODIXANOL 320 MG/ML IV SOLN
INTRAVENOUS | Status: DC | PRN
Start: 2021-07-17 — End: 2021-07-17
  Administered 2021-07-17: 20 mL

## 2021-07-17 SURGICAL SUPPLY — 9 items

## 2021-07-17 NOTE — Op Note (Signed)
    Patient name: Peter Oneill MRN: [id number] DOB: 30-Dec-1940 Sex: male  07/17/2021 Pre-operative Diagnosis: Fistula function, difficulty with cannulation Post-operative diagnosis:  Same Surgeon:  Broadus John, MD Procedure Performed: 1. ultrasound-guided micropuncture access of the left brachiocephalic fistula 2.  Fistulogram 3.  Access managed with Monocryl suture   Indications:  The patient is having difficulty using his fistula.  I discussed with the family that the neck step is to proceed with a fistulogram to better evaluate the fistula and make future recommendations.    Findings:  Widely patent arterial anastomosis. No flow-limiting stenosis appreciated in the fistula Four small branches appreciated along the length of the brachiocephalic fistula   Procedure:   Patient was brought to the cardiac Cath Lab and laid in supine position.  He was prepped and draped in standard fashion a timeout was performed. The case began with ultrasound-guided micropuncture access of the left brachiocephalic fistula.  Fistulogram followed through the micropuncture sheath. No intervention performed.  Impression: Fistula widely patent.  No anatomic reason to explain difficulty with cannulation.   Cassandria Santee, MD [facility] [place] Office: [phone]

## 2021-07-17 NOTE — H&P (Signed)
Patient name: Peter Oneill MRN: [id number] DOB: 10/09/1940 Sex: male  Patient seen and examined in preop holding.  No complaints. No changes to medication history or physical exam since last seen in clinic. After discussing the risks and benefits of left arm fistulagram for difficulty accessing at dialysis, Peter Oneill elected to proceed.   Broadus John MD   REASON FOR VISIT:     Follow up  HISTORY OF PRESENT ILLNESS:   Peter Oneill is a 81 y.o. male who is status post left brachiocephalic fistula creation on 04/23/2021 by Dr. Unk Lightning.  They have been trying to use it at the dialysis center but have been unsuccessful.  He developed a large hematoma in his arm and has gone back to using the catheter.  CURRENT MEDICATIONS:    Current Facility-Administered Medications  Medication Dose Route Frequency Provider Last Rate Last Admin   0.9 %  sodium chloride infusion  250 mL Intravenous PRN Broadus John, MD       sodium chloride flush (NS) 0.9 % injection 3 mL  3 mL Intravenous Q12H Broadus John, MD       sodium chloride flush (NS) 0.9 % injection 3 mL  3 mL Intravenous PRN Broadus John, MD        REVIEW OF SYSTEMS:   [X]  denotes positive finding, [ ]  denotes negative finding Cardiac  Comments:  Chest pain or chest pressure:    Shortness of breath upon exertion:    Short of breath when lying flat:    Irregular heart rhythm:    Constitutional    Fever or chills:      PHYSICAL EXAM:   Vitals:   07/17/21 1123  BP: (!) 141/63  Pulse: 78  Resp: 18  SpO2: 98%  Weight: 89.9 kg  Height: 5\' 8"  (1.727 m)    GENERAL: The patient is a well-nourished male, in no acute distress. The vital signs are documented above. CARDIOVASCULAR: There is a regular rate and rhythm. PULMONARY: Non-labored respirations SonoSite was used to evaluate the fistula.  It appears to be of adequate diameter.  There are some branches however  they do not appear to be significant.  The vein does not appear to be too superficial and has an excellent thrill  STUDIES:    +------------+----------+-------------+----------+----------------+  OUTFLOW VEINPSV (cm/s)Diameter (cm)Depth (cm)    Describe      +------------+----------+-------------+----------+----------------+  Prox UA        223        0.67        0.84                     +------------+----------+-------------+----------+----------------+  Mid UA         246        0.68        0.13   competing branch  +------------+----------+-------------+----------+----------------+  Dist UA        187        0.65        0.10   competing branch  +------------+----------+-------------+----------+----------------+  AC Fossa       144        0.96        0.13                     +------------+----------+-------------+----------+----------------+  MEDICAL ISSUES:   The patient is having difficulty using his fistula.  I discussed with the family that the neck step is to proceed with a fistulogram to better evaluate the fistula and make future recommendations.  This will be scheduled with Dr. Unk Lightning per the family request.  Annamarie Major, IV, MD, FACS [facility] [phone] Pager [phone]

## 2021-07-18 ENCOUNTER — Encounter (HOSPITAL_COMMUNITY): Payer: Self-pay | Admitting: Vascular Surgery

## 2021-08-02 NOTE — Telephone Encounter (Signed)
Patient was scheduled for office visit to discuss this issue.

## 2021-08-07 ENCOUNTER — Encounter (HOSPITAL_BASED_OUTPATIENT_CLINIC_OR_DEPARTMENT_OTHER): Payer: Medicare HMO | Attending: Physician Assistant | Admitting: Physician Assistant

## 2021-08-07 DIAGNOSIS — Z992 Dependence on renal dialysis: Secondary | ICD-10-CM | POA: Diagnosis not present

## 2021-08-07 DIAGNOSIS — Z7901 Long term (current) use of anticoagulants: Secondary | ICD-10-CM | POA: Insufficient documentation

## 2021-08-07 DIAGNOSIS — G4730 Sleep apnea, unspecified: Secondary | ICD-10-CM | POA: Insufficient documentation

## 2021-08-07 DIAGNOSIS — Z7902 Long term (current) use of antithrombotics/antiplatelets: Secondary | ICD-10-CM | POA: Insufficient documentation

## 2021-08-07 DIAGNOSIS — L89313 Pressure ulcer of right buttock, stage 3: Secondary | ICD-10-CM | POA: Diagnosis not present

## 2021-08-07 DIAGNOSIS — I5042 Chronic combined systolic (congestive) and diastolic (congestive) heart failure: Secondary | ICD-10-CM | POA: Diagnosis not present

## 2021-08-07 DIAGNOSIS — N186 End stage renal disease: Secondary | ICD-10-CM | POA: Diagnosis not present

## 2021-08-07 DIAGNOSIS — L89323 Pressure ulcer of left buttock, stage 3: Secondary | ICD-10-CM | POA: Insufficient documentation

## 2021-08-07 DIAGNOSIS — I132 Hypertensive heart and chronic kidney disease with heart failure and with stage 5 chronic kidney disease, or end stage renal disease: Secondary | ICD-10-CM | POA: Insufficient documentation

## 2021-08-07 DIAGNOSIS — I2510 Atherosclerotic heart disease of native coronary artery without angina pectoris: Secondary | ICD-10-CM | POA: Insufficient documentation

## 2021-08-09 NOTE — Progress Notes (Signed)
BERTIE, SIMIEN ([id number]) Visit Report for 08/07/2021 Abuse Risk Screen Details Patient Name: Date of Service: [name] [facility] 08/07/2021 9:45 A M Medical Record Number: [id number] Patient Account Number: [id number] Date of Birth/Sex: Treating RN: [birth year] (81 y.o. [name] Primary Care Caralina Nop: Juline Patch Other Clinician: Referring Obert Espindola: Treating Kienna Moncada/Extender: Gardner Candle in Treatment: 0 Abuse Risk Screen Items Answer ABUSE RISK SCREEN: Has anyone close to you tried to hurt or harm you recentlyo No Do you feel uncomfortable with anyone in your familyo No Has anyone forced you do things that you didnt want to doo No Electronic Signature(s) Signed: 08/07/2021 5:24:59 PM By: Deon Pilling RN, BSN Signed: 08/09/2021 11:45:44 AM By: Erenest Blank Entered By: Erenest Blank on 08/07/2021 09:45:15 -------------------------------------------------------------------------------- Activities of Daily Living Details Patient Name: Date of Service: [name], [place] 08/07/2021 9:45 A M Medical Record Number: [id number] Patient Account Number: [id number] Date of Birth/Sex: Treating RN: 1940/03/08 (81 y.o. [name] Primary Care Keyvon Herter: Juline Patch Other Clinician: Referring Trystyn Sitts: Treating Samel Bruna/Extender: Gardner Candle in Treatment: 0 Activities of Daily Living Items Answer Activities of Daily Living (Please select one for each item) Drive Automobile Completely Able T Medications ake Completely Able Use T elephone Completely Able Care for Appearance Completely Able Use T oilet Completely Able Bath / Shower Completely Able Dress Self Completely Able Feed Self Completely Able Walk Completely Able Get In / Out Bed Completely Able Housework Completely Able Prepare Meals Completely [place] for Self Completely Able Electronic Signature(s) Signed: 08/07/2021 5:24:59 PM By:  Deon Pilling RN, BSN Signed: 08/09/2021 11:45:44 AM By: Erenest Blank Entered By: Erenest Blank on 08/07/2021 09:45:39 -------------------------------------------------------------------------------- Education Screening Details Patient Name: Date of Service: [name], [place] 08/07/2021 9:45 A M Medical Record Number: [id number] Patient Account Number: [id number] Date of Birth/Sex: Treating RN: 1940/09/23 (81 y.o. [name], [name] Primary Care Reyli Schroth: Juline Patch Other Clinician: Referring Ival Pacer: Treating Whitten Andreoni/Extender: Gardner Candle in Treatment: 0 Primary Learner Assessed: Patient Learning Preferences/Education Level/Primary Language Learning Preference: Explanation, Demonstration, Printed Material Highest Education Level: High School Preferred Language: English Cognitive Barrier Language Barrier: No Translator Needed: No Memory Deficit: No Emotional Barrier: No Cultural/Religious Beliefs Affecting Medical Care: No Physical Barrier Impaired Vision: Yes Glasses Impaired Hearing: No Decreased Hand dexterity: No Knowledge/Comprehension Knowledge Level: High Comprehension Level: High Ability to understand written instructions: High Ability to understand verbal instructions: High Motivation Anxiety Level: Calm Cooperation: Cooperative Education Importance: Acknowledges Need Interest in Health Problems: Asks Questions Perception: Coherent Willingness to Engage in Self-Management High Activities: Readiness to Engage in Self-Management High Activities: Electronic Signature(s) Signed: 08/07/2021 5:24:59 PM By: Deon Pilling RN, BSN Signed: 08/09/2021 11:45:44 AM By: Erenest Blank Entered By: Erenest Blank on 08/07/2021 09:46:06 -------------------------------------------------------------------------------- Fall Risk Assessment Details Patient Name: Date of Service: [name], [place] LTER 08/07/2021 9:45 A M Medical Record Number:  [id number] Patient Account Number: [id number] Date of Birth/Sex: Treating RN: 20-Apr-1940 (81 y.o. [name], [name] Primary Care Fara Worthy: Juline Patch Other Clinician: Referring Rikita Grabert: Treating Anne-Marie Genson/Extender: Gardner Candle in Treatment: 0 Fall Risk Assessment Items Have you had 2 or more falls in the last 12 monthso 0 No Have you had any fall that resulted in injury in the last 12 monthso 0 No FALLS RISK SCREEN History of falling - immediate or within 3 months 0 No Secondary diagnosis (Do you have 2 or more  medical diagnoseso) 0 No Ambulatory aid None/bed rest/wheelchair/nurse 0 No Crutches/cane/walker 15 Yes Furniture 0 No Intravenous therapy Access/Saline/Heparin Lock 0 No Gait/Transferring Normal/ bed rest/ wheelchair 0 Yes Weak (short steps with or without shuffle, stooped but able to lift head while walking, may seek 0 No support from furniture) Impaired (short steps with shuffle, may have difficulty arising from chair, head down, impaired 0 No balance) Mental Status Oriented to own ability 0 Yes Electronic Signature(s) Signed: 08/07/2021 5:24:59 PM By: Deon Pilling RN, BSN Signed: 08/09/2021 11:45:44 AM By: Erenest Blank Entered By: Erenest Blank on 08/07/2021 09:46:13 -------------------------------------------------------------------------------- Foot Assessment Details Patient Name: Date of Service: [name], [place] 08/07/2021 9:45 A M Medical Record Number: [id number] Patient Account Number: [id number] Date of Birth/Sex: Treating RN: 21-Feb-1941 (81 y.o. [name] Primary Care Kelechi Orgeron: Juline Patch Other Clinician: Referring Sparkle Aube: Treating Hurshell Dino/Extender: Gardner Candle in Treatment: 0 Foot Assessment Items Site Locations + = Sensation present, - = Sensation absent, C = Callus, U = Ulcer R = Redness, W = Warmth, M = Maceration, PU = Pre-ulcerative lesion F = Fissure, S = Swelling, D =  Dryness Assessment Right: Left: Other Deformity: No No Prior Foot Ulcer: No No Prior Amputation: No No Charcot Joint: No No Ambulatory Status: Gait: Notes No BLE wounds. Electronic Signature(s) Signed: 08/07/2021 5:24:59 PM By: Deon Pilling RN, BSN Signed: 08/09/2021 11:45:44 AM By: Erenest Blank Entered By: Erenest Blank on 08/07/2021 09:46:52 -------------------------------------------------------------------------------- Nutrition Risk Screening Details Patient Name: Date of Service: [name], [place] 08/07/2021 9:45 A M Medical Record Number: [id number] Patient Account Number: [id number] Date of Birth/Sex: Treating RN: Sep 21, 1940 (81 y.o. [name], [name] Primary Care Jasie Meleski: Juline Patch Other Clinician: Referring Addis Bennie: Treating Kayona Foor/Extender: Gardner Candle in Treatment: 0 Height (in): 68 Weight (lbs): 200 Body Mass Index (BMI): 30.4 Nutrition Risk Screening Items Score Screening NUTRITION RISK SCREEN: I have an illness or condition that made me change the kind and/or amount of food I eat 2 Yes I eat fewer than two meals per day 0 No I eat few fruits and vegetables, or milk products 0 No I have three or more drinks of beer, liquor or wine almost every day 0 No I have tooth or mouth problems that make it hard for me to eat 0 No I don't always have enough money to buy the food I need 0 No I eat alone most of the time 0 No I take three or more different prescribed or over-the-counter drugs a day 1 Yes Without wanting to, I have lost or gained 10 pounds in the last six months 0 No I am not always physically able to shop, cook and/or feed myself 0 No Nutrition Protocols Good Risk Protocol Moderate Risk Protocol 0 Provide education on nutrition High Risk Proctocol Risk Level: Moderate Risk Score: 3 Electronic Signature(s) Signed: 08/07/2021 5:24:59 PM By: Deon Pilling RN, BSN Signed: 08/09/2021 11:45:44 AM By: Erenest Blank Entered  By: Erenest Blank on 08/07/2021 09:46:29

## 2021-08-09 NOTE — Progress Notes (Signed)
[name], Oneill ([id number]) Visit Report for 08/07/2021 Chief Complaint Document Details Patient Name: Date of Service: Peter Oneill [facility] 08/07/2021 9:45 A M Medical Record Number: [id number] Patient Account Number: [id number] Date of Birth/Sex: Treating RN: [birth year] (81 y.o. Peter Oneill Primary Care Provider: Juline Patch Other Clinician: Referring Provider: Treating Provider/Extender: Gardner Candle in Treatment: 0 Information Obtained from: Patient Chief Complaint Bilateral gluteal pressure ulcers Electronic Signature(s) Signed: 08/07/2021 10:33:14 AM By: Worthy Keeler PA-C Entered By: Worthy Keeler on 08/07/2021 10:33:13 -------------------------------------------------------------------------------- Debridement Details Patient Name: Date of Service: Peter Oneill, [place] LTER 08/07/2021 9:45 A M Medical Record Number: [id number] Patient Account Number: [id number] Date of Birth/Sex: Treating RN: 1940-10-24 (81 y.o. Peter Oneill, [place] Primary Care Provider: Juline Patch Other Clinician: Referring Provider: Treating Provider/Extender: Gardner Candle in Treatment: 0 Debridement Performed for Assessment: Wound #1 Right Gluteus Performed By: Physician Worthy Keeler, PA Debridement Type: Debridement Level of Consciousness (Pre-procedure): Awake and Alert Pre-procedure Verification/Time Out Yes - 10:30 Taken: Start Time: 10:31 Pain Control: Lidocaine 5% topical ointment T Area Debrided (L x W): otal 1.3 (cm) x 0.6 (cm) = 0.78 (cm) Tissue and other material debrided: Viable, Non-Viable, Slough, Subcutaneous, Skin: Dermis , Skin: Epidermis, Fibrin/Exudate, Slough Level: Skin/Subcutaneous Tissue Debridement Description: Excisional Instrument: Curette Bleeding: Minimum Hemostasis Achieved: Pressure End Time: 10:39 Procedural Pain: 2 Post Procedural Pain: 3 Response to Treatment: Procedure was tolerated well Level of Consciousness  (Post- Awake and Alert procedure): Post Debridement Measurements of Total Wound Length: (cm) 1.3 Stage: Category/Stage III Width: (cm) 0.8 Depth: (cm) 0.4 Volume: (cm) 0.327 Character of Wound/Ulcer Post Debridement: Improved Post Procedure Diagnosis Same as Pre-procedure Electronic Signature(s) Signed: 08/07/2021 5:11:12 PM By: Worthy Keeler PA-C Signed: 08/07/2021 5:24:59 PM By: Deon Pilling RN, BSN Entered By: Deon Pilling on 08/07/2021 10:40:02 -------------------------------------------------------------------------------- Debridement Details Patient Name: Date of Service: Peter Oneill, [place] LTER 08/07/2021 9:45 A M Medical Record Number: [id number] Patient Account Number: [id number] Date of Birth/Sex: Treating RN: May 27, 1940 (81 y.o. Peter Oneill, [place]eding Primary Care Provider: Juline Patch Other Clinician: Referring Provider: Treating Provider/Extender: Gardner Candle in Treatment: 0 Debridement Performed for Assessment: Wound #2 Left Gluteus Performed By: Physician Worthy Keeler, PA Debridement Type: Debridement Level of Consciousness (Pre-procedure): Awake and Alert Pre-procedure Verification/Time Out Yes - 10:30 Taken: Start Time: 10:31 Pain Control: Lidocaine 5% topical ointment T Area Debrided (L x W): otal 0.8 (cm) x 0.7 (cm) = 0.56 (cm) Tissue and other material debrided: Viable, Non-Viable, Slough, Subcutaneous, Skin: Dermis , Skin: Epidermis, Fibrin/Exudate, Slough Level: Skin/Subcutaneous Tissue Debridement Description: Excisional Instrument: Curette Bleeding: Minimum Hemostasis Achieved: Pressure End Time: 10:39 Procedural Pain: 2 Post Procedural Pain: 3 Response to Treatment: Procedure was tolerated well Level of Consciousness (Post- Awake and Alert procedure): Post Debridement Measurements of Total Wound Length: (cm) 0.8 Stage: Category/Stage III Width: (cm) 0.7 Depth: (cm) 0.4 Volume: (cm) 0.176 Character of Wound/Ulcer  Post Debridement: Improved Post Procedure Diagnosis Same as Pre-procedure Electronic Signature(s) Signed: 08/07/2021 5:11:12 PM By: Worthy Keeler PA-C Signed: 08/07/2021 5:24:59 PM By: Deon Pilling RN, BSN Entered By: Deon Pilling on 08/07/2021 10:40:20 -------------------------------------------------------------------------------- HPI Details Patient Name: Date of Service: Peter Oneill, [place] LTER 08/07/2021 9:45 A M Medical Record Number: [id number] Patient Account Number: [id number] Date of Birth/Sex: Treating RN: 02-Apr-1940 (81 y.o. Peter Oneill Primary Care Provider: Juline Patch Other Clinician: Referring Provider: Treating Provider/Extender: Bradly Bienenstock, Oris Drone  in Treatment: 0 History of Present Illness HPI Description: 08-07-2021 upon evaluation today patient appears to be doing somewhat poorly in regard to wounds that he has over the bilateral gluteal region and this has been present for about a month. It started out as a rash this cleared and then since then he has had 2 open spots that seem to be more pressure related he was referred from dermatology to [place] here at the wound care center. He does have a culture previous that showed 1 bacteria and 1 fungal organism he was treated for both. He also is on Plavix due to his history of heart disease in general. He is also been placed more recently on dialysis and he has a recent fistula this might be part of the reason for the Plavix as well. Nonetheless in regard to the overall appearance of his wound things seem to be doing decently well although there is some necrotic tissue we need to clean away I think this is something we get him to heal he just needs to make sure that he is getting up and moving around not sitting for extended periods of time. Patient does have a history of end-stage renal disease with dependence on renal dialysis, long-term use of anticoagulant therapy, sleep apnea, coronary artery disease,  hypertension, and congestive heart failure. Electronic Signature(s) Signed: 08/07/2021 10:58:53 AM By: Worthy Keeler PA-C Entered By: Worthy Keeler on 08/07/2021 10:58:53 -------------------------------------------------------------------------------- Physical Exam Details Patient Name: Date of Service: Peter Oneill LTER 08/07/2021 9:45 A M Medical Record Number: [id number] Patient Account Number: [id number] Date of Birth/Sex: Treating RN: Apr 24, 1940 (81 y.o. Peter Oneill Primary Care Provider: Juline Patch Other Clinician: Referring Provider: Treating Provider/Extender: Gardner Candle in Treatment: 0 Constitutional sitting or standing blood pressure is within target range for patient.. pulse regular and within target range for patient.Marland Kitchen respirations regular, non-labored and within target range for patient.Marland Kitchen temperature within target range for patient.. Well-nourished and well-hydrated in no acute distress. Eyes conjunctiva clear no eyelid edema noted. pupils equal round and reactive to light and accommodation. Ears, Nose, Mouth, and Throat no gross abnormality of ear auricles or external auditory canals. normal hearing noted during conversation. mucus membranes moist. Respiratory normal breathing without difficulty. Cardiovascular 2+ dorsalis pedis/posterior tibialis pulses. Musculoskeletal normal gait and posture. no significant deformity or arthritic changes, no loss or range of motion, no clubbing. Psychiatric this patient is able to make decisions and demonstrates good insight into disease process. Alert and Oriented x 3. pleasant and cooperative. Notes Upon inspection patient's wound bed actually showed signs of good granulation and epithelization at this point. Fortunately there does not appear to be any evidence of infection there was a lot of necrotic tissue that did have to be cleared away. I did perform debridement to clear this away today he  tolerated that without complication postdebridement the wound bed appears to be doing much better which is great news. Electronic Signature(s) Signed: 08/07/2021 10:59:24 AM By: Worthy Keeler PA-C Entered By: Worthy Keeler on 08/07/2021 10:59:23 -------------------------------------------------------------------------------- Physician Orders Details Patient Name: Date of Service: Peter Oneill, [place] LTER 08/07/2021 9:45 A M Medical Record Number: [id number] Patient Account Number: [id number] Date of Birth/Sex: Treating RN: [birth year] (81 y.o. Peter Oneill Primary Care Provider: Juline Patch Other Clinician: Referring Provider: Treating Provider/Extender: Gardner Candle in Treatment: 0 Verbal / Phone Orders: No Diagnosis Coding ICD-10 Coding Code Description [phone] Pressure ulcer of  left buttock, stage 3 L89.313 Pressure ulcer of right buttock, stage 3 I50.42 Chronic combined systolic (congestive) and diastolic (congestive) heart failure I10 Essential (primary) hypertension I25.10 Atherosclerotic heart disease of native coronary artery without angina pectoris G47.30 Sleep apnea, unspecified Z79.01 Long term (current) use of anticoagulants Z99.2 Dependence on renal dialysis N18.6 End stage renal disease Follow-up Appointments ppointment in 1 week. Jeri Cos, PA and [place], Room 8 Wednesday 0900 08/14/2021 Return A Other: - [employer]- to send wound care supplies. Bathing/ Shower/ Hygiene May shower and wash wound with soap and water. Off-Loading Other: - ensure to stand and/or walk every hour while sitting. use a piece of memory foam to sit home while sitting. Wound Treatment Wound #1 - Gluteus Wound Laterality: Right Cleanser: Soap and Water 1 x Per Day/30 Days Discharge Instructions: May shower and wash wound with dial antibacterial soap and water prior to dressing change. Cleanser: Wound Cleanser 1 x Per Day/30 Days Discharge Instructions:  Cleanse the wound with wound cleanser prior to applying a clean dressing using gauze sponges, not tissue or cotton balls. Peri-Wound Care: Skin Prep 1 x Per Day/30 Days Discharge Instructions: Use skin prep as directed Prim Dressing: Hydrofera Blue Ready Foam, 2.5 x2.5 in 1 x Per Day/30 Days ary Discharge Instructions: Apply to wound bed as instructed Secondary Dressing: Bordered Gauze, 2x2 in 1 x Per Day/30 Days Discharge Instructions: Apply over primary dressing as directed. Wound #2 - Gluteus Wound Laterality: Left Cleanser: Soap and Water 1 x Per Day/30 Days Discharge Instructions: May shower and wash wound with dial antibacterial soap and water prior to dressing change. Cleanser: Wound Cleanser 1 x Per Day/30 Days Discharge Instructions: Cleanse the wound with wound cleanser prior to applying a clean dressing using gauze sponges, not tissue or cotton balls. Peri-Wound Care: Skin Prep 1 x Per Day/30 Days Discharge Instructions: Use skin prep as directed Prim Dressing: Hydrofera Blue Ready Foam, 2.5 x2.5 in 1 x Per Day/30 Days ary Discharge Instructions: Apply to wound bed Secondary Dressing: Bordered Gauze, 2x2 in 1 x Per Day/30 Days Discharge Instructions: Apply over primary dressing as directed. Patient Medications llergies: atorvastatin A Notifications Medication Indication Start End lidocaine DOSE topical 5 % ointment - ointment topical only in clinic. Electronic Signature(s) Signed: 08/07/2021 5:11:12 PM By: Worthy Keeler PA-C Signed: 08/07/2021 5:24:59 PM By: Deon Pilling RN, BSN Entered By: Deon Pilling on 08/07/2021 10:47:05 Prescription 08/07/2021 -------------------------------------------------------------------------------- Casilda Carls PA Patient Name: Provider: [birth year] [id number] Date of Birth: NPI#: Jerilynn Mages [id number] Sex: DEA #: [phone] Phone #: License #: [place] Patient Address: [place] [address] [place], [place] [address] [place],  [address] [phone] Allergies atorvastatin Medication Medication: Route: Strength: Form: lidocaine topical 5% ointment Class: TOPICAL LOCAL ANESTHETICS Dose: Frequency / Time: Indication: ointment topical only in clinic. Number of Refills: Number of Units: 0 Generic Substitution: Start Date: End Date: Administered at Facility: Substitution Permitted Yes Time Administered: Time Discontinued: Note to Pharmacy: Hand Signature: Date(s): Electronic Signature(s) Signed: 08/07/2021 5:11:12 PM By: Worthy Keeler PA-C Signed: 08/07/2021 5:24:59 PM By: Deon Pilling RN, BSN Entered By: Deon Pilling on 08/07/2021 10:47:05 -------------------------------------------------------------------------------- Problem List Details Patient Name: Date of Service: Peter Oneill, [place] 08/07/2021 9:45 A M Medical Record Number: [id number] Patient Account Number: [id number] Date of Birth/Sex: Treating RN: 04-30-1940 (81 y.o. Peter Oneill Primary Care Provider: Juline Patch Other Clinician: Referring Provider: Treating Provider/Extender:  Stone III, Viann Shove, Erin Weeks in Treatment: 0 Active Problems ICD-10 Encounter Code Description Active Date MDM Diagnosis [phone] Pressure ulcer of left buttock, stage 3 08/07/2021 No Yes L89.313 Pressure ulcer of right buttock, stage 3 08/07/2021 No Yes I50.42 Chronic combined systolic (congestive) and diastolic (congestive) heart failure 08/07/2021 No Yes I10 Essential (primary) hypertension 08/07/2021 No Yes I25.10 Atherosclerotic heart disease of native coronary artery without angina pectoris 08/07/2021 No Yes G47.30 Sleep apnea, unspecified 08/07/2021 No Yes Z79.01 Long term (current) use of anticoagulants 08/07/2021 No Yes Z99.2 Dependence on renal dialysis 08/07/2021 No Yes N18.6 End stage renal disease 08/07/2021 No Yes Inactive Problems Resolved  Problems Electronic Signature(s) Signed: 08/07/2021 10:32:56 AM By: Worthy Keeler PA-C Entered By: Worthy Keeler on 08/07/2021 10:32:56 -------------------------------------------------------------------------------- Progress Note Details Patient Name: Date of Service: Peter Oneill, [place] 08/07/2021 9:45 A M Medical Record Number: [id number] Patient Account Number: [id number] Date of Birth/Sex: Treating RN: [birth year] (81 y.o. Peter Oneill Primary Care Provider: Juline Patch Other Clinician: Referring Provider: Treating Provider/Extender: Gardner Candle in Treatment: 0 Subjective Chief Complaint Information obtained from Patient Bilateral gluteal pressure ulcers History of Present Illness (HPI) 08-07-2021 upon evaluation today patient appears to be doing somewhat poorly in regard to wounds that he has over the bilateral gluteal region and this has been present for about a month. It started out as a rash this cleared and then since then he has had 2 open spots that seem to be more pressure related he was referred from dermatology to [place] here at the wound care center. He does have a culture previous that showed 1 bacteria and 1 fungal organism he was treated for both. He also is on Plavix due to his history of heart disease in general. He is also been placed more recently on dialysis and he has a recent fistula this might be part of the reason for the Plavix as well. Nonetheless in regard to the overall appearance of his wound things seem to be doing decently well although there is some necrotic tissue we need to clean away I think this is something we get him to heal he just needs to make sure that he is getting up and moving around not sitting for extended periods of time. Patient does have a history of end-stage renal disease with dependence on renal dialysis, long-term use of anticoagulant therapy, sleep apnea, coronary artery disease, hypertension, and  congestive heart failure. Patient History Information obtained from Chart. Allergies atorvastatin (Reaction: muscle aches) Family History Cancer - Mother, Diabetes - Paternal Grandparents, Heart Disease - Father, No family history of Hereditary Spherocytosis, Hypertension, Kidney Disease, Lung Disease, Seizures, Stroke, Thyroid Problems, Tuberculosis. Social History Former smoker - quit 1978, Marital Status - Married, Alcohol Use - Rarely, Drug Use - No History, Caffeine Use - Daily. Medical History Eyes Denies history of Cataracts, Glaucoma, Optic Neuritis Ear/Nose/Mouth/Throat Denies history of Chronic sinus problems/congestion, Middle ear problems Hematologic/Lymphatic Patient has history of Anemia Denies history of Hemophilia, Human Immunodeficiency Virus, Lymphedema, Sickle Cell Disease Respiratory Patient has history of Sleep Apnea Denies history of Aspiration, Asthma, Chronic Obstructive Pulmonary Disease (COPD), Pneumothorax, Tuberculosis Cardiovascular Patient has history of Coronary Artery Disease, Hypertension Gastrointestinal Denies history of Cirrhosis , Colitis, Crohnoos, Hepatitis A, Hepatitis B, Hepatitis C Endocrine Denies history of Type I Diabetes, Type II Diabetes Genitourinary Patient has history of End Stage Renal Disease - dialysis 04/2021 Immunological Denies history of Lupus Erythematosus, Raynaudoos, Scleroderma Integumentary (Skin) Denies  history of History of Burn Musculoskeletal Denies history of Gout, Rheumatoid Arthritis, Osteoarthritis, Osteomyelitis Neurologic Denies history of Dementia, Neuropathy, Quadriplegia, Paraplegia, Seizure Disorder Oncologic Denies history of Received Chemotherapy, Received Radiation Psychiatric Denies history of Anorexia/bulimia, Confinement Anxiety Hospitalization/Surgery History - A/V Fisulagram Left. - AV Fisula placement left. - Back Surgery. - Cardiac Catheterization. - Carotid Endarterectomy. -  Carpal tunnel. - Coronary artery bypass. - hernia repair. - percutaneous coronary stent. - Sternotomy. Medical A Surgical History Notes nd Constitutional Symptoms (General Health) asbestosis Respiratory PNA Cardiovascular heart disease hypercholesterolemia CABG Gastrointestinal ileus Integumentary (Skin) Psoriasis Review of Systems (ROS) Constitutional Symptoms (General Health) Denies complaints or symptoms of Fatigue, Fever, Chills, Marked Weight Change. Eyes Complains or has symptoms of Glasses / Contacts. Ear/Nose/Mouth/Throat Denies complaints or symptoms of Chronic sinus problems or rhinitis. Respiratory Denies complaints or symptoms of Chronic or frequent coughs, Shortness of Breath. Cardiovascular Denies complaints or symptoms of Chest pain. Gastrointestinal Denies complaints or symptoms of Frequent diarrhea, Nausea, Vomiting. Endocrine Denies complaints or symptoms of Heat/cold intolerance. Integumentary (Skin) Complains or has symptoms of Wounds - BL buttock. Musculoskeletal Denies complaints or symptoms of Muscle Pain, Muscle Weakness. Neurologic Denies complaints or symptoms of Numbness/parasthesias. Psychiatric Denies complaints or symptoms of Claustrophobia, Suicidal. Objective Constitutional sitting or standing blood pressure is within target range for patient.. pulse regular and within target range for patient.Marland Kitchen respirations regular, non-labored and within target range for patient.Marland Kitchen temperature within target range for patient.. Well-nourished and well-hydrated in no acute distress. Vitals Time Taken: 9:40 AM, Height: 68 in, Source: Stated, Weight: 200 lbs, Source: Stated, BMI: 30.4, Temperature: 97.7 F, Pulse: 85 bpm, Respiratory Rate: 20 breaths/min, Blood Pressure: 136/61 mmHg. Eyes conjunctiva clear no eyelid edema noted. pupils equal round and reactive to light and accommodation. Ears, Nose, Mouth, and Throat no gross abnormality of ear auricles or  external auditory canals. normal hearing noted during conversation. mucus membranes moist. Respiratory normal breathing without difficulty. Cardiovascular 2+ dorsalis pedis/posterior tibialis pulses. Musculoskeletal normal gait and posture. no significant deformity or arthritic changes, no loss or range of motion, no clubbing. Psychiatric this patient is able to make decisions and demonstrates good insight into disease process. Alert and Oriented x 3. pleasant and cooperative. General Notes: Upon inspection patient's wound bed actually showed signs of good granulation and epithelization at this point. Fortunately there does not appear to be any evidence of infection there was a lot of necrotic tissue that did have to be cleared away. I did perform debridement to clear this away today he tolerated that without complication postdebridement the wound bed appears to be doing much better which is great news. Integumentary (Hair, Skin) Wound #1 status is Open. Original cause of wound was Pressure Injury. The date acquired was: 07/25/2020. The wound is located on the Right Gluteus. The wound measures 1.3cm length x 0.8cm width x 0.4cm depth; 0.817cm^2 area and 0.327cm^3 volume. There is Fat Layer (Subcutaneous Tissue) exposed. There is undermining starting at 12:00 and ending at 12:00 with a maximum distance of 0.4cm. There is a medium amount of serosanguineous drainage noted. The wound margin is distinct with the outline attached to the wound base. There is medium (34-66%) red granulation within the wound bed. There is a medium (34-66%) amount of necrotic tissue within the wound bed including Adherent Slough. Wound #2 status is Open. Original cause of wound was Gradually Appeared. The date acquired was: 07/25/2020. The wound is located on the Left Gluteus. The wound measures 0.8cm length x 0.7cm width x  0.4cm depth; 0.44cm^2 area and 0.176cm^3 volume. There is Fat Layer (Subcutaneous Tissue) exposed.  There is no tunneling noted, however, there is undermining starting at 12:00 and ending at 12:00 with a maximum distance of 0.6cm. There is a small amount of serosanguineous drainage noted. The wound margin is distinct with the outline attached to the wound base. There is medium (34-66%) red granulation within the wound bed. There is a medium (34-66%) amount of necrotic tissue within the wound bed including Adherent Slough. Assessment Active Problems ICD-10 Pressure ulcer of left buttock, stage 3 Pressure ulcer of right buttock, stage 3 Chronic combined systolic (congestive) and diastolic (congestive) heart failure Essential (primary) hypertension Atherosclerotic heart disease of native coronary artery without angina pectoris Sleep apnea, unspecified Long term (current) use of anticoagulants Dependence on renal dialysis End stage renal disease Procedures Wound #1 Pre-procedure diagnosis of Wound #1 is a Pressure Ulcer located on the Right Gluteus . There was a Excisional Skin/Subcutaneous Tissue Debridement with a total area of 0.78 sq cm performed by Worthy Keeler, PA. With the following instrument(s): Curette to remove Viable and Non-Viable tissue/material. Material removed includes Subcutaneous Tissue, Slough, Skin: Dermis, Skin: Epidermis, and Fibrin/Exudate after achieving pain control using Lidocaine 5% topical ointment. A time out was conducted at 10:30, prior to the start of the procedure. A Minimum amount of bleeding was controlled with Pressure. The procedure was tolerated well with a pain level of 2 throughout and a pain level of 3 following the procedure. Post Debridement Measurements: 1.3cm length x 0.8cm width x 0.4cm depth; 0.327cm^3 volume. Post debridement Stage noted as Category/Stage III. Character of Wound/Ulcer Post Debridement is improved. Post procedure Diagnosis Wound #1: Same as Pre-Procedure Wound #2 Pre-procedure diagnosis of Wound #2 is a Pressure Ulcer  located on the Left Gluteus . There was a Excisional Skin/Subcutaneous Tissue Debridement with a total area of 0.56 sq cm performed by Worthy Keeler, PA. With the following instrument(s): Curette to remove Viable and Non-Viable tissue/material. Material removed includes Subcutaneous Tissue, Slough, Skin: Dermis, Skin: Epidermis, and Fibrin/Exudate after achieving pain control using Lidocaine 5% topical ointment. A time out was conducted at 10:30, prior to the start of the procedure. A Minimum amount of bleeding was controlled with Pressure. The procedure was tolerated well with a pain level of 2 throughout and a pain level of 3 following the procedure. Post Debridement Measurements: 0.8cm length x 0.7cm width x 0.4cm depth; 0.176cm^3 volume. Post debridement Stage noted as Category/Stage III. Character of Wound/Ulcer Post Debridement is improved. Post procedure Diagnosis Wound #2: Same as Pre-Procedure Plan Follow-up Appointments: Return Appointment in 1 week. Jeri Cos, PA and [place], Room 8 Wednesday 0900 08/14/2021 Other: - [employer]- to send wound care supplies. Bathing/ Shower/ Hygiene: May shower and wash wound with soap and water. Off-Loading: Other: - ensure to stand and/or walk every hour while sitting. use a piece of memory foam to sit home while sitting. The following medication(s) was prescribed: lidocaine topical 5 % ointment ointment topical only in clinic. was prescribed at facility WOUND #1: - Gluteus Wound Laterality: Right Cleanser: Soap and Water 1 x Per Day/30 Days Discharge Instructions: May shower and wash wound with dial antibacterial soap and water prior to dressing change. Cleanser: Wound Cleanser 1 x Per Day/30 Days Discharge Instructions: Cleanse the wound with wound cleanser prior to applying a clean dressing using gauze sponges, not tissue or cotton balls. Peri-Wound Care: Skin Prep 1 x Per Day/30 Days Discharge Instructions: Use  skin prep as  directed Prim Dressing: Hydrofera Blue Ready Foam, 2.5 x2.5 in 1 x Per Day/30 Days ary Discharge Instructions: Apply to wound bed as instructed Secondary Dressing: Bordered Gauze, 2x2 in 1 x Per Day/30 Days Discharge Instructions: Apply over primary dressing as directed. WOUND #2: - Gluteus Wound Laterality: Left Cleanser: Soap and Water 1 x Per Day/30 Days Discharge Instructions: May shower and wash wound with dial antibacterial soap and water prior to dressing change. Cleanser: Wound Cleanser 1 x Per Day/30 Days Discharge Instructions: Cleanse the wound with wound cleanser prior to applying a clean dressing using gauze sponges, not tissue or cotton balls. Peri-Wound Care: Skin Prep 1 x Per Day/30 Days Discharge Instructions: Use skin prep as directed Prim Dressing: Hydrofera Blue Ready Foam, 2.5 x2.5 in 1 x Per Day/30 Days ary Discharge Instructions: Apply to wound bed Secondary Dressing: Bordered Gauze, 2x2 in 1 x Per Day/30 Days Discharge Instructions: Apply over primary dressing as directed. 1. I am going to suggest that we go ahead and continue with the wound care measures as before and the patient is in agreement with plan. This includes the use of the dressing to cover I think that is going to be necessary. We will be utilizing Hydrofera Blue. 2. I am also can recommend that we have the patient continue to offload in fact he needs to do this much more aggressively and frequently and I discussed that with him today. This includes the getting up at least once an hour to walk around for 5 to 10 minutes so that he is not sitting for extended periods of time I think this alone is can be one of the biggest factors in getting things improving. We will see patient back for reevaluation in 1 week here in the clinic. If anything worsens or changes patient will contact our office for additional recommendations. Electronic Signature(s) Signed: 08/07/2021 11:01:31 AM By: Worthy Keeler  PA-C Entered By: Worthy Keeler on 08/07/2021 11:01:30 -------------------------------------------------------------------------------- HxROS Details Patient Name: Date of Service: Peter Oneill, [place] LTER 08/07/2021 9:45 A M Medical Record Number: [id number] Patient Account Number: [id number] Date of Birth/Sex: Treating RN: Jan 28, 1941 (81 y.o. Peter Oneill Primary Care Provider: Juline Patch Other Clinician: Referring Provider: Treating Provider/Extender: Gardner Candle in Treatment: 0 Information Obtained From Chart Constitutional Symptoms (General Health) Complaints and Symptoms: Negative for: Fatigue; Fever; Chills; Marked Weight Change Medical History: Past Medical History Notes: asbestosis Eyes Complaints and Symptoms: Positive for: Glasses / Contacts Medical History: Negative for: Cataracts; Glaucoma; Optic Neuritis Ear/Nose/Mouth/Throat Complaints and Symptoms: Negative for: Chronic sinus problems or rhinitis Medical History: Negative for: Chronic sinus problems/congestion; Middle ear problems Respiratory Complaints and Symptoms: Negative for: Chronic or frequent coughs; Shortness of Breath Medical History: Positive for: Sleep Apnea Negative for: Aspiration; Asthma; Chronic Obstructive Pulmonary Disease (COPD); Pneumothorax; Tuberculosis Past Medical History Notes: PNA Cardiovascular Complaints and Symptoms: Negative for: Chest pain Medical History: Positive for: Coronary Artery Disease; Hypertension Past Medical History Notes: heart disease hypercholesterolemia CABG Gastrointestinal Complaints and Symptoms: Negative for: Frequent diarrhea; Nausea; Vomiting Medical History: Negative for: Cirrhosis ; Colitis; Crohns; Hepatitis A; Hepatitis B; Hepatitis C Past Medical History Notes: ileus Endocrine Complaints and Symptoms: Negative for: Heat/cold intolerance Medical History: Negative for: Type I Diabetes; Type II  Diabetes Integumentary (Skin) Complaints and Symptoms: Positive for: Wounds - BL buttock Medical History: Negative for: History of Burn Past Medical History Notes: Psoriasis Musculoskeletal Complaints and Symptoms: Negative for: Muscle Pain; Muscle  Weakness Medical History: Negative for: Gout; Rheumatoid Arthritis; Osteoarthritis; Osteomyelitis Neurologic Complaints and Symptoms: Negative for: Numbness/parasthesias Medical History: Negative for: Dementia; Neuropathy; Quadriplegia; Paraplegia; Seizure Disorder Psychiatric Complaints and Symptoms: Negative for: Claustrophobia; Suicidal Medical History: Negative for: Anorexia/bulimia; Confinement Anxiety Hematologic/Lymphatic Medical History: Positive for: Anemia Negative for: Hemophilia; Human Immunodeficiency Virus; Lymphedema; Sickle Cell Disease Genitourinary Medical History: Positive for: End Stage Renal Disease - dialysis 04/2021 Immunological Medical History: Negative for: Lupus Erythematosus; Raynauds; Scleroderma Oncologic Medical History: Negative for: Received Chemotherapy; Received Radiation Immunizations Pneumococcal Vaccine: Received Pneumococcal Vaccination: Yes Received Pneumococcal Vaccination On or After 60th Birthday: Yes Implantable Devices None Hospitalization / Surgery History Type of Hospitalization/Surgery A/V Fisulagram Left AV Fisula placement left Back Surgery Cardiac Catheterization Carotid Endarterectomy Carpal tunnel Coronary artery bypass hernia repair percutaneous coronary stent Sternotomy Family and Social History Cancer: Yes - Mother; Diabetes: Yes - Paternal Grandparents; Heart Disease: Yes - Father; Hereditary Spherocytosis: No; Hypertension: No; Kidney Disease: No; Lung Disease: No; Seizures: No; Stroke: No; Thyroid Problems: No; Tuberculosis: No; Former smoker - quit 1978; Marital Status - Married; Alcohol Use: Rarely; Drug Use: No History; Caffeine Use: Daily; Financial  Concerns: No; Food, Clothing or Shelter Needs: No; Support System Lacking: No; Transportation Concerns: No Electronic Signature(s) Signed: 08/07/2021 5:11:12 PM By: Worthy Keeler PA-C Signed: 08/07/2021 5:24:59 PM By: Deon Pilling RN, BSN Signed: 08/09/2021 11:45:44 AM By: Erenest Blank Entered By: Erenest Blank on 08/07/2021 10:05:08 -------------------------------------------------------------------------------- SuperBill Details Patient Name: Date of Service: Peter Oneill, [place] 08/07/2021 Medical Record Number: [id number] Patient Account Number: [id number] Date of Birth/Sex: Treating RN: Jul 10, 1940 (81 y.o. Peter Oneill, [name] Primary Care Provider: Juline Patch Other Clinician: Referring Provider: Treating Provider/Extender: Gardner Candle in Treatment: 0 Diagnosis Coding ICD-10 Codes Code Description [phone] Pressure ulcer of left buttock, stage 3 L89.313 Pressure ulcer of right buttock, stage 3 I50.42 Chronic combined systolic (congestive) and diastolic (congestive) heart failure I10 Essential (primary) hypertension I25.10 Atherosclerotic heart disease of native coronary artery without angina pectoris G47.30 Sleep apnea, unspecified Z79.01 Long term (current) use of anticoagulants Z99.2 Dependence on renal dialysis N18.6 End stage renal disease Facility Procedures CPT4 Code: [id number] Description: [place] VISIT-LEV 3 EST PT Modifier: Quantity: 1 CPT4 Code: [id number] Description: 11042 - DEB SUBQ TISSUE 20 SQ CM/< ICD-10 Diagnosis Description L89.323 Pressure ulcer of left buttock, stage 3 L89.313 Pressure ulcer of right buttock, stage 3 Modifier: Quantity: 1 Physician Procedures : CPT4 Code Description Modifier [id number] WC PHYS LEVEL 3 NEW PT 25 ICD-10 Diagnosis Description L89.323 Pressure ulcer of left buttock, stage 3 L89.313 Pressure ulcer of right buttock, stage 3 I50.42 Chronic combined systolic (congestive) and diastolic  (congestive)  heart failure I10 Essential (primary) hypertension Quantity: 1 : [id number] 11042 - WC PHYS SUBQ TISS 20 SQ CM ICD-10 Diagnosis Description L89.323 Pressure ulcer of left buttock, stage 3 L89.313 Pressure ulcer of right buttock, stage 3 Quantity: 1 Electronic Signature(s) Signed: 08/07/2021 11:04:11 AM By: Worthy Keeler PA-C Entered By: Worthy Keeler on 08/07/2021 11:04:10

## 2021-08-09 NOTE — Progress Notes (Signed)
Peter Oneill ([id number]) Visit Report for 08/07/2021 Allergy List Details Patient Name: Date of Service: Peter Oneill [facility] 08/07/2021 9:45 A M Medical Record Number: [id number] Patient Account Number: [id number] Date of Birth/Sex: Treating RN: 1940/11/15 (81 y.o. Peter Oneill Primary Care Peter Oneill: Peter Oneill Other Clinician: Referring Peter Oneill: Treating Peter Oneill Weeks in Treatment: Oneill Allergies Active Allergies atorvastatin Reaction: muscle aches Allergy Notes Electronic Signature(s) Signed: 08/07/2021 5:24:59 PM By: Deon Pilling RN, BSN Entered By: Deon Pilling on 08/06/2021 17:05:51 -------------------------------------------------------------------------------- Arrival Information Details Patient Name: Date of Service: Peter Oneill, [place] LTER 08/07/2021 9:45 A M Medical Record Number: [id number] Patient Account Number: [id number] Date of Birth/Sex: Treating RN: [birth year] (81 y.o. Peter Oneill Primary Care Peter Oneill: Peter Oneill Other Clinician: Referring Peter Oneill: Treating Peter Oneill/Extender: Peter Oneill in Treatment: Oneill Visit Information Patient Arrived: Peter Oneill Time: 09:42 Accompanied By: wife Transfer Assistance: None Patient Identification Verified: Yes Secondary Verification Process Completed: Yes Patient Requires Transmission-Based Precautions: No Patient Has Alerts: Yes Patient Alerts: Patient on Blood Thinner Electronic Signature(s) Signed: 08/09/2021 11:45:44 AM By: Erenest Blank Entered By: Erenest Blank on 08/07/2021 09:44:25 -------------------------------------------------------------------------------- Clinic Level of Care Assessment Details Patient Name: Date of Service: Peter Oneill [facility] 08/07/2021 9:45 A M Medical Record Number: [id number] Patient Account Number: [id number] Date of Birth/Sex: Treating RN: [birth year] (81 y.o. Peter Oneill Primary Care Shironda Kain: Peter Oneill Other Clinician: Referring Eunique Balik: Treating Marguerita Stapp/Extender: Peter Oneill in Treatment: Oneill Clinic Level of Care Assessment Items TOOL 1 Quantity Score X- 1 Oneill Use when EandM and Procedure is performed on INITIAL visit ASSESSMENTS - Nursing Assessment / Reassessment X- 1 20 General Physical Exam (combine w/ comprehensive assessment (listed just below) when performed on new pt. evals) X- 1 25 Comprehensive Assessment (HX, ROS, Risk Assessments, Wounds Hx, etc.) ASSESSMENTS - Wound and Skin Assessment / Reassessment X- 1 10 Dermatologic / Skin Assessment (not related to wound area) ASSESSMENTS - Ostomy and/or Continence Assessment and Care '[]'  - Oneill Incontinence Assessment and Management '[]'  - Oneill Ostomy Care Assessment and Management (repouching, etc.) PROCESS - Coordination of Care '[]'  - Oneill Simple Patient / Family Education for ongoing care X- 1 20 Complex (extensive) Patient / Family Education for ongoing care X- 1 10 Staff obtains Programmer, systems, Records, T Results / Process Orders est '[]'  - Oneill Staff telephones HHA, Nursing Homes / Clarify orders / etc '[]'  - Oneill Routine Transfer to another Facility (non-emergent condition) '[]'  - Oneill Routine Hospital Admission (non-emergent condition) X- 1 15 New Admissions / Biomedical engineer / Ordering NPWT Apligraf, etc. , '[]'  - Oneill Emergency Hospital Admission (emergent condition) PROCESS - Special Needs '[]'  - Oneill Pediatric / Minor Patient Management '[]'  - Oneill Isolation Patient Management '[]'  - Oneill Hearing / Language / Visual special needs '[]'  - Oneill Assessment of Community assistance (transportation, D/C planning, etc.) '[]'  - Oneill Additional assistance / Altered mentation '[]'  - Oneill Support Surface(s) Assessment (bed, cushion, seat, etc.) INTERVENTIONS - Miscellaneous '[]'  - Oneill External ear exam '[]'  - Oneill Patient Transfer (multiple staff / Civil Service fast streamer / Similar devices) '[]'  - Oneill Simple Staple / Suture removal (25 or less) '[]'  -  Oneill Complex Staple / Suture removal (26 or more) '[]'  - Oneill Hypo/Hyperglycemic Management (do not check if billed separately) '[]'  - Oneill Ankle / Brachial Index (ABI) - do not check if billed separately Has the patient been seen at the hospital within the last  three years: Yes Total Score: 100 Level Of Care: New/Established - Level 3 Electronic Signature(s) Signed: 08/07/2021 5:24:59 PM By: Deon Pilling RN, BSN Entered By: Deon Pilling on 08/07/2021 10:47:33 -------------------------------------------------------------------------------- Encounter Discharge Information Details Patient Name: Date of Service: Peter Oneill, [place] 08/07/2021 9:45 A M Medical Record Number: [id number] Patient Account Number: [id number] Date of Birth/Sex: Treating RN: [birth year] (81 y.o. Peter Oneill Primary Care [name]: Peter Oneill Other Clinician: Referring Chelsi Warr: Treating Azusena Erlandson/Extender: Peter Oneill in Treatment: Oneill Encounter Discharge Information Items Post Procedure Vitals Discharge Condition: Stable Temperature (F): 97.7 Ambulatory Status: Walker Pulse (bpm): 85 Discharge Destination: Home Respiratory Rate (breaths/min): 20 Transportation: Private Auto Blood Pressure (mmHg): 136/61 Accompanied By: wife Schedule Follow-up Appointment: Yes Clinical Summary of Care: Electronic Signature(s) Signed: 08/07/2021 5:24:59 PM By: Deon Pilling RN, BSN Entered By: Deon Pilling on 08/07/2021 10:48:19 -------------------------------------------------------------------------------- Lower Extremity Assessment Details Patient Name: Date of Service: Peter Oneill, [place] 08/07/2021 9:45 A M Medical Record Number: [id number] Patient Account Number: [id number] Date of Birth/Sex: Treating RN: 26-Nov-1940 (81 y.o. Peter Oneill Primary Care Chisom Muntean: Peter Oneill Other Clinician: Referring Bertin Inabinet: Treating Faithann Natal/Extender: Peter Oneill in Treatment:  Oneill Electronic Signature(s) Signed: 08/07/2021 5:24:59 PM By: Deon Pilling RN, BSN Signed: 08/09/2021 11:45:44 AM By: Erenest Blank Entered By: Erenest Blank on 08/07/2021 09:46:57 -------------------------------------------------------------------------------- Multi-Disciplinary Care Plan Details Patient Name: Date of Service: Peter Oneill, [place] LTER 08/07/2021 9:45 A M Medical Record Number: [id number] Patient Account Number: [id number] Date of Birth/Sex: Treating RN: June 22, 1940 (81 y.o. Peter Oneill, Peter Oneill Primary Care Colleene Swarthout: Peter Oneill Other Clinician: Referring Deaire Mcwhirter: Treating Amiah Frohlich/Extender: Peter Oneill in Treatment: Oneill Active Inactive Orientation to the Wound Care Program Nursing Diagnoses: Knowledge deficit related to the wound healing center program Goals: Patient/caregiver will verbalize understanding of the [facility] Program Date Initiated: 08/07/2021 Target Resolution Date: 08/30/2021 Goal Status: Active Interventions: Provide education on orientation to the wound center Notes: Pain, Acute or Chronic Nursing Diagnoses: Pain, acute or chronic: actual or potential Potential alteration in comfort, pain Goals: Patient will verbalize adequate pain control and receive pain control interventions during procedures as needed Date Initiated: 08/07/2021 Target Resolution Date: 08/30/2021 Goal Status: Active Patient/caregiver will verbalize comfort level met Date Initiated: 08/07/2021 Target Resolution Date: 08/30/2021 Goal Status: Active Interventions: Encourage patient to take pain medications as prescribed Provide education on pain management Treatment Activities: Administer pain control measures as ordered : 08/07/2021 Notes: Pressure Nursing Diagnoses: Knowledge deficit related to management of pressures ulcers Potential for impaired tissue integrity related to pressure, friction, moisture, and shear Goals: Patient will remain free  from development of additional pressure ulcers Date Initiated: 08/07/2021 Target Resolution Date: 08/30/2021 Goal Status: Active Patient will remain free of pressure ulcers Date Initiated: 08/07/2021 Target Resolution Date: 08/29/2021 Goal Status: Active Patient/caregiver will verbalize understanding of pressure ulcer management Date Initiated: 08/07/2021 Target Resolution Date: 08/29/2021 Goal Status: Active Interventions: Assess: immobility, friction, shearing, incontinence upon admission and as needed Assess offloading mechanisms upon admission and as needed Provide education on pressure ulcers Treatment Activities: Patient referred for pressure reduction/relief devices : 08/07/2021 Pressure reduction/relief device ordered : 08/07/2021 Notes: Electronic Signature(s) Signed: 08/07/2021 5:24:59 PM By: Deon Pilling RN, BSN Entered By: Deon Pilling on 08/07/2021 10:36:18 -------------------------------------------------------------------------------- Pain Assessment Details Patient Name: Date of Service: Peter Oneill, [place] 08/07/2021 9:45 A M Medical Record Number: [id number] Patient Account Number: [id number] Date of Birth/Sex: Treating RN: 1941/01/10 (81 y.o.  Peter Oneill Primary Care Antuan Limes: Peter Oneill Other Clinician: Referring Makinsley Schiavi: Treating Lilton Pare/Extender: Peter Oneill in Treatment: Oneill Active Problems Location of Pain Severity and Description of Pain Patient Has Paino No Site Locations Rate the pain. Current Pain Level: Oneill Pain Management and Medication Current Pain Management: Medication: No Cold Application: No Rest: No Massage: No Activity: No T.E.N.S.: No Heat Application: No Leg drop or elevation: No Is the Current Pain Management Adequate: Adequate How does your wound impact your activities of daily livingo Sleep: No Bathing: No Appetite: No Relationship With Others: No Bladder Continence: No Emotions: No Bowel Continence:  No Work: No Toileting: No Drive: No Dressing: No Hobbies: No Electronic Signature(s) Signed: 08/07/2021 5:24:59 PM By: Deon Pilling RN, BSN Signed: 08/09/2021 11:45:44 AM By: Erenest Blank Entered By: Erenest Blank on 08/07/2021 09:46:38 -------------------------------------------------------------------------------- Patient/Caregiver Education Details Patient Name: Date of Service: Peter Oneill, [place] 6/14/2023andnbsp9:45 A M Medical Record Number: [id number] Patient Account Number: [id number] Date of Birth/Gender: Treating RN: 17-Mar-1940 (81 y.o. Peter Oneill Primary Care Physician: Peter Oneill Other Clinician: Referring Physician: Treating Physician/Extender: Peter Oneill in Treatment: Oneill Education Assessment Education Provided To: Patient and Caregiver Education Topics Provided Pressure: Handouts: Pressure Ulcers: Care and Offloading, Pressure Ulcers: Care and Offloading 2, Preventing Pressure Ulcers Methods: Explain/Verbal, Printed Responses: Reinforcements needed Alexis: o Handouts: Welcome T [facility] [place] o Methods: Explain/Verbal, Printed Responses: Reinforcements needed Electronic Signature(s) Signed: 08/07/2021 5:24:59 PM By: Deon Pilling RN, BSN Entered By: Deon Pilling on 08/07/2021 10:36:42 -------------------------------------------------------------------------------- Wound Assessment Details Patient Name: Date of Service: Peter Oneill, [place] LTER 08/07/2021 9:45 A M Medical Record Number: [id number] Patient Account Number: [id number] Date of Birth/Sex: Treating RN: 10/10/1940 (81 y.o. Peter Oneill, Peter Oneill Primary Care Siegfried Vieth: Peter Oneill Other Clinician: Referring Alvia Jablonski: Treating Gricelda Foland/Extender: Peter Oneill in Treatment: Oneill Wound Status Wound Number: 1 Primary Pressure Ulcer Etiology: Wound Location: Right Gluteus Wound Open Wounding Event: Pressure  Injury Status: Date Acquired: 07/25/2020 Comorbid Anemia, Sleep Apnea, Coronary Artery Disease, Hypertension, Weeks Of Treatment: Oneill History: End Stage Renal Disease Clustered Wound: No Photos Wound Measurements Length: (cm) 1.3 Width: (cm) Oneill.8 Depth: (cm) Oneill.4 Area: (cm) Oneill.817 Volume: (cm) Oneill.327 % Reduction in Area: Oneill% % Reduction in Volume: Oneill% Epithelialization: None Undermining: Yes Starting Position (o'clock): 12 Ending Position (o'clock): 12 Maximum Distance: (cm) Oneill.4 Wound Description Classification: Category/Stage III Wound Margin: Distinct, outline attached Exudate Amount: Medium Exudate Type: Serosanguineous Exudate Color: red, brown Foul Odor After Cleansing: No Slough/Fibrino Yes Wound Bed Granulation Amount: Medium (34-66%) Exposed Structure Granulation Quality: Red Fascia Exposed: No Necrotic Amount: Medium (34-66%) Fat Layer (Subcutaneous Tissue) Exposed: Yes Necrotic Quality: Adherent Slough Tendon Exposed: No Muscle Exposed: No Joint Exposed: No Bone Exposed: No Treatment Notes Wound #1 (Gluteus) Wound Laterality: Right Cleanser Soap and Water Discharge Instruction: May shower and wash wound with dial antibacterial soap and water prior to dressing change. Wound Cleanser Discharge Instruction: Cleanse the wound with wound cleanser prior to applying a clean dressing using gauze sponges, not tissue or cotton balls. Peri-Wound Care Skin Prep Discharge Instruction: Use skin prep as directed Topical Primary Dressing Hydrofera Blue Ready Foam, 2.5 x2.5 in Discharge Instruction: Apply to wound bed as instructed Secondary Dressing Bordered Gauze, 2x2 in Discharge Instruction: Apply over primary dressing as directed. Secured With Compression Wrap Compression Stockings Environmental education officer) Signed: 08/07/2021 5:24:59 PM By: Deon Pilling RN, BSN  Entered By: Deon Pilling on 08/07/2021  10:37:15 -------------------------------------------------------------------------------- Wound Assessment Details Patient Name: Date of Service: Peter Oneill [facility] 08/07/2021 9:45 A M Medical Record Number: [id number] Patient Account Number: [id number] Date of Birth/Sex: Treating RN: [birth year] (81 y.o. Peter Oneill, Peter Oneill Primary Care Demtrius Rounds: Peter Oneill Other Clinician: Referring Verenis Nicosia: Treating Skyleigh Windle/Extender: Peter Oneill in Treatment: Oneill Wound Status Wound Number: 2 Primary Pressure Ulcer Etiology: Wound Location: Left Gluteus Wound Open Wounding Event: Gradually Appeared Status: Date Acquired: 07/25/2020 Comorbid Anemia, Sleep Apnea, Coronary Artery Disease, Hypertension, Weeks Of Treatment: Oneill History: End Stage Renal Disease Clustered Wound: No Photos Wound Measurements Length: (cm) Oneill.8 Width: (cm) Oneill.7 Depth: (cm) Oneill.4 Area: (cm) Oneill.44 Volume: (cm) Oneill.176 % Reduction in Area: Oneill% % Reduction in Volume: Oneill% Epithelialization: None Tunneling: No Undermining: Yes Starting Position (o'clock): 12 Ending Position (o'clock): 12 Maximum Distance: (cm) Oneill.6 Wound Description Classification: Category/Stage III Wound Margin: Distinct, outline attached Exudate Amount: Small Exudate Type: Serosanguineous Exudate Color: red, brown Foul Odor After Cleansing: No Slough/Fibrino Yes Wound Bed Granulation Amount: Medium (34-66%) Exposed Structure Granulation Quality: Red Fat Layer (Subcutaneous Tissue) Exposed: Yes Necrotic Amount: Medium (34-66%) Necrotic Quality: Adherent Slough Treatment Notes Wound #2 (Gluteus) Wound Laterality: Left Cleanser Soap and Water Discharge Instruction: May shower and wash wound with dial antibacterial soap and water prior to dressing change. Wound Cleanser Discharge Instruction: Cleanse the wound with wound cleanser prior to applying a clean dressing using gauze sponges, not tissue or cotton balls. Peri-Wound  Care Skin Prep Discharge Instruction: Use skin prep as directed Topical Primary Dressing Hydrofera Blue Ready Foam, 2.5 x2.5 in Discharge Instruction: Apply to wound bed Secondary Dressing Bordered Gauze, 2x2 in Discharge Instruction: Apply over primary dressing as directed. Secured With Compression Wrap Compression Stockings Environmental education officer) Signed: 08/07/2021 5:24:59 PM By: Deon Pilling RN, BSN Entered By: Deon Pilling on 08/07/2021 10:37:31 -------------------------------------------------------------------------------- Vitals Details Patient Name: Date of Service: Peter Oneill, [place] 08/07/2021 9:45 A M Medical Record Number: [id number] Patient Account Number: [id number] Date of Birth/Sex: Treating RN: 10-Oct-1940 (81 y.o. Peter Oneill, Peter Oneill Primary Care Patricie Geeslin: Peter Oneill Other Clinician: Referring Bertis Hustead: Treating Dalyn Kjos/Extender: Peter Oneill in Treatment: Oneill Vital Signs Time Taken: 09:40 Temperature (F): 97.7 Height (in): 68 Pulse (bpm): 85 Source: Stated Respiratory Rate (breaths/min): 20 Weight (lbs): 200 Blood Pressure (mmHg): 136/61 Source: Stated Reference Range: 80 - 120 mg / dl Body Mass Index (BMI): 30.4 Electronic Signature(s) Signed: 08/09/2021 11:45:44 AM By: Erenest Blank Entered By: Erenest Blank on 08/07/2021 09:45:06

## 2021-08-14 ENCOUNTER — Encounter (HOSPITAL_BASED_OUTPATIENT_CLINIC_OR_DEPARTMENT_OTHER): Payer: Medicare HMO | Admitting: Physician Assistant

## 2021-08-14 DIAGNOSIS — L89323 Pressure ulcer of left buttock, stage 3: Secondary | ICD-10-CM | POA: Diagnosis not present

## 2021-08-14 NOTE — Progress Notes (Addendum)
SKY, BORBOA ([id number]) Visit Report for 08/14/2021 Chief Complaint Document Details Patient Name: Date of Service: Peter Oneill [facility] 08/14/2021 9:00 A M Medical Record Number: [id number] Patient Account Number: [id number] Date of Birth/Sex: Treating RN: 08-22-1940 (81 y.o. Peter Oneill Primary Care Provider: Juline Patch Other Clinician: Referring Provider: Treating Provider/Extender: Pleas Patricia in Treatment: 1 Information Obtained from: Patient Chief Complaint Bilateral gluteal pressure ulcers Electronic Signature(s) Signed: 08/14/2021 8:57:55 AM By: Worthy Keeler PA-C Entered By: Worthy Keeler on 08/14/2021 08:57:55 -------------------------------------------------------------------------------- Debridement Details Patient Name: Date of Service: Peter Oneill, [place] 08/14/2021 9:00 [employer] Record Number: [id number] Patient Account Number: [id number] Date of Birth/Sex: Treating RN: Sep 15, 1940 (81 y.o. Peter Oneill, [place] Primary Care Provider: Juline Patch Other Clinician: Referring Provider: Treating Provider/Extender: Pleas Patricia in Treatment: 1 Debridement Performed for Assessment: Wound #2 Left Gluteus Performed By: Physician Worthy Keeler, PA Debridement Type: Debridement Level of Consciousness (Pre-procedure): Awake and Alert Pre-procedure Verification/Time Out Yes - 09:30 Taken: Start Time: 09:31 Pain Control: Other : benzocaine 20% T Area Debrided (L x W): otal 0.6 (cm) x 0.6 (cm) = 0.36 (cm) Tissue and other material debrided: Viable, Non-Viable, Slough, Subcutaneous, Slough Level: Skin/Subcutaneous Tissue Debridement Description: Excisional Instrument: Curette Bleeding: Minimum Hemostasis Achieved: Pressure End Time: 09:36 Procedural Pain: 0 Post Procedural Pain: 0 Response to Treatment: Procedure was tolerated well Level of Consciousness (Post- Awake and Alert procedure): Post  Debridement Measurements of Total Wound Length: (cm) 0.6 Stage: Category/Stage III Width: (cm) 0.6 Depth: (cm) 0.4 Volume: (cm) 0.113 Character of Wound/Ulcer Post Debridement: Improved Post Procedure Diagnosis Same as Pre-procedure Electronic Signature(s) Signed: 08/14/2021 4:19:27 PM By: Deon Pilling RN, BSN Signed: 08/14/2021 5:09:34 PM By: Worthy Keeler PA-C Entered By: Deon Pilling on 08/14/2021 09:37:13 -------------------------------------------------------------------------------- Debridement Details Patient Name: Date of Service: Peter Oneill, [place] 08/14/2021 9:00 A M Medical Record Number: [id number] Patient Account Number: [id number] Date of Birth/Sex: Treating RN: [birth year] (81 y.o. Peter Oneill, [place] Primary Care Provider: Juline Patch Other Clinician: Referring Provider: Treating Provider/Extender: Pleas Patricia in Treatment: 1 Debridement Performed for Assessment: Wound #1 Right Gluteus Performed By: Physician Worthy Keeler, PA Debridement Type: Debridement Level of Consciousness (Pre-procedure): Awake and Alert Pre-procedure Verification/Time Out Yes - 09:30 Taken: Start Time: 09:31 Pain Control: Other : benzocaine 20% T Area Debrided (L x W): otal 1.3 (cm) x 0.7 (cm) = 0.91 (cm) Tissue and other material debrided: Viable, Non-Viable, Slough, Subcutaneous, Slough Level: Skin/Subcutaneous Tissue Debridement Description: Excisional Instrument: Curette Bleeding: Minimum Hemostasis Achieved: Pressure End Time: 09:36 Procedural Pain: 0 Post Procedural Pain: 0 Response to Treatment: Procedure was tolerated well Level of Consciousness (Post- Awake and Alert procedure): Post Debridement Measurements of Total Wound Length: (cm) 1.3 Stage: Category/Stage III Width: (cm) 0.7 Depth: (cm) 0.4 Volume: (cm) 0.286 Character of Wound/Ulcer Post Debridement: Improved Post Procedure Diagnosis Same as Pre-procedure Electronic  Signature(s) Signed: 08/14/2021 4:19:27 PM By: Deon Pilling RN, BSN Signed: 08/14/2021 5:09:34 PM By: Worthy Keeler PA-C Entered By: Deon Pilling on 08/14/2021 09:37:32 -------------------------------------------------------------------------------- HPI Details Patient Name: Date of Service: Peter Oneill, [place] 08/14/2021 9:00 A M Medical Record Number: [id number] Patient Account Number: [id number] Date of Birth/Sex: Treating RN: 1940/04/28 (81 y.o. Peter Oneill Primary Care Provider: Juline Patch Other Clinician: Referring Provider: Treating Provider/Extender: Pleas Patricia in Treatment: 1 History of Present Illness HPI  Description: 08-07-2021 upon evaluation today patient appears to be doing somewhat poorly in regard to wounds that he has over the bilateral gluteal region and this has been present for about a month. It started out as a rash this cleared and then since then he has had 2 open spots that seem to be more pressure related he was referred from dermatology to [place] here at the wound care center. He does have a culture previous that showed 1 bacteria and 1 fungal organism he was treated for both. He also is on Plavix due to his history of heart disease in general. He is also been placed more recently on dialysis and he has a recent fistula this might be part of the reason for the Plavix as well. Nonetheless in regard to the overall appearance of his wound things seem to be doing decently well although there is some necrotic tissue we need to clean away I think this is something we get him to heal he just needs to make sure that he is getting up and moving around not sitting for extended periods of time. Patient does have a history of end-stage renal disease with dependence on renal dialysis, long-term use of anticoagulant therapy, sleep apnea, coronary artery disease, hypertension, and congestive heart failure. 08-14-2021 upon evaluation today patient  appears to be doing well with regard to his wounds both are showing signs of improvement in just 1 week's time already I am very pleased with where we stand. With that being said I do believe that the patient would benefit from some sharp debridement today to clearway some of the necrotic debris he is in agreement with the plan and the good news is this does not hurt too severely and he is able to tolerate those debridements. Electronic Signature(s) Signed: 08/14/2021 12:55:20 PM By: Worthy Keeler PA-C Entered By: Worthy Keeler on 08/14/2021 12:55:20 -------------------------------------------------------------------------------- Physical Exam Details Patient Name: Date of Service: Peter Oneill [facility] 08/14/2021 9:00 A M Medical Record Number: [id number] Patient Account Number: [id number] Date of Birth/Sex: Treating RN: 1941-01-04 (81 y.o. Peter Oneill Primary Care Provider: Juline Patch Other Clinician: Referring Provider: Treating Provider/Extender: Pleas Patricia in Treatment: 1 Constitutional Well-nourished and well-hydrated in no acute distress. Respiratory normal breathing without difficulty. Psychiatric this patient is able to make decisions and demonstrates good insight into disease process. Alert and Oriented x 3. pleasant and cooperative. Notes Upon inspection patient's wound bed actually showed signs of good granulation and epithelization at this point. Fortunately I do not see any evidence of active infection locally or systemically at this time which is great news and overall I am extremely pleased with where we stand currently. No fevers, chills, nausea, vomiting, or diarrhea. Postdebridement the wound beds appear to be doing much better at both locations. Electronic Signature(s) Signed: 08/14/2021 12:55:51 PM By: Worthy Keeler PA-C Entered By: Worthy Keeler on 08/14/2021  12:55:51 -------------------------------------------------------------------------------- Physician Orders Details Patient Name: Date of Service: Peter Oneill, [place] 08/14/2021 9:00 A M Medical Record Number: [id number] Patient Account Number: [id number] Date of Birth/Sex: Treating RN: [birth year] (81 y.o. Peter Oneill Primary Care Provider: Juline Patch Other Clinician: Referring Provider: Treating Provider/Extender: Pleas Patricia in Treatment: 1 Verbal / Phone Orders: No Diagnosis Coding ICD-10 Coding Code Description [phone] Pressure ulcer of left buttock, stage 3 L89.313 Pressure ulcer of right buttock, stage 3 I50.42 Chronic combined systolic (congestive) and diastolic (congestive) heart  failure I10 Essential (primary) hypertension I25.10 Atherosclerotic heart disease of native coronary artery without angina pectoris G47.30 Sleep apnea, unspecified Z79.01 Long term (current) use of anticoagulants Z99.2 Dependence on renal dialysis N18.6 End stage renal disease Follow-up Appointments ppointment in 1 week. Jeri Cos, PA and [place], Room 8 Wednesday 3pm 08/21/2021 Return A Other: - [employer]- to send wound care supplies. Bathing/ Shower/ Hygiene May shower and wash wound with soap and water. Off-Loading Other: - ensure to stand and/or walk every hour while sitting. use a piece of memory foam to sit home while sitting. Wound Treatment Wound #1 - Gluteus Wound Laterality: Right Cleanser: Soap and Water 1 x Per Day/30 Days Discharge Instructions: May shower and wash wound with dial antibacterial soap and water prior to dressing change. Cleanser: Wound Cleanser 1 x Per Day/30 Days Discharge Instructions: Cleanse the wound with wound cleanser prior to applying a clean dressing using gauze sponges, not tissue or cotton balls. Peri-Wound Care: Skin Prep 1 x Per Day/30 Days Discharge Instructions: Use skin prep as directed Prim Dressing: Hydrofera  Blue Ready Foam, 2.5 x2.5 in 1 x Per Day/30 Days ary Discharge Instructions: Apply to wound bed as instructed Secondary Dressing: Bordered Gauze, 2x2 in 1 x Per Day/30 Days Discharge Instructions: Apply over primary dressing as directed. Wound #2 - Gluteus Wound Laterality: Left Cleanser: Soap and Water 1 x Per Day/30 Days Discharge Instructions: May shower and wash wound with dial antibacterial soap and water prior to dressing change. Cleanser: Wound Cleanser 1 x Per Day/30 Days Discharge Instructions: Cleanse the wound with wound cleanser prior to applying a clean dressing using gauze sponges, not tissue or cotton balls. Peri-Wound Care: Skin Prep 1 x Per Day/30 Days Discharge Instructions: Use skin prep as directed Prim Dressing: Hydrofera Blue Ready Foam, 2.5 x2.5 in 1 x Per Day/30 Days ary Discharge Instructions: Apply to wound bed Secondary Dressing: Bordered Gauze, 2x2 in 1 x Per Day/30 Days Discharge Instructions: Apply over primary dressing as directed. Electronic Signature(s) Signed: 08/14/2021 4:19:27 PM By: Deon Pilling RN, BSN Signed: 08/14/2021 5:09:34 PM By: Worthy Keeler PA-C Entered By: Deon Pilling on 08/14/2021 09:47:28 -------------------------------------------------------------------------------- Problem List Details Patient Name: Date of Service: Peter Oneill, Peter Oneill 08/14/2021 9:00 A M Medical Record Number: [id number] Patient Account Number: [id number] Date of Birth/Sex: Treating RN: 27-Oct-1940 (81 y.o. Peter Oneill Primary Care Provider: Juline Patch Other Clinician: Referring Provider: Treating Provider/Extender: Pleas Patricia in Treatment: 1 Active Problems ICD-10 Encounter Code Description Active Date MDM Diagnosis [phone] Pressure ulcer of left buttock, stage 3 08/07/2021 No Yes L89.313 Pressure ulcer of right buttock, stage 3 08/07/2021 No Yes I50.42 Chronic combined systolic (congestive) and diastolic (congestive)  heart failure 08/07/2021 No Yes I10 Essential (primary) hypertension 08/07/2021 No Yes I25.10 Atherosclerotic heart disease of native coronary artery without angina pectoris 08/07/2021 No Yes G47.30 Sleep apnea, unspecified 08/07/2021 No Yes Z79.01 Long term (current) use of anticoagulants 08/07/2021 No Yes Z99.2 Dependence on renal dialysis 08/07/2021 No Yes N18.6 End stage renal disease 08/07/2021 No Yes Inactive Problems Resolved Problems Electronic Signature(s) Signed: 08/14/2021 8:57:47 AM By: Worthy Keeler PA-C Entered By: Worthy Keeler on 08/14/2021 08:57:46 -------------------------------------------------------------------------------- Progress Note Details Patient Name: Date of Service: Peter Oneill, [place] 08/14/2021 9:00 A M Medical Record Number: [id number] Patient Account Number: [id number] Date of Birth/Sex: Treating RN: 02-28-1940 (81 y.o. Peter Oneill Primary Care Provider: Juline Patch Other Clinician: Referring Provider: Treating Provider/Extender:  Stone III, Blanca Friend, Rex Kras Weeks in Treatment: 1 Subjective Chief Complaint Information obtained from Patient Bilateral gluteal pressure ulcers History of Present Illness (HPI) 08-07-2021 upon evaluation today patient appears to be doing somewhat poorly in regard to wounds that he has over the bilateral gluteal region and this has been present for about a month. It started out as a rash this cleared and then since then he has had 2 open spots that seem to be more pressure related he was referred from dermatology to [place] here at the wound care center. He does have a culture previous that showed 1 bacteria and 1 fungal organism he was treated for both. He also is on Plavix due to his history of heart disease in general. He is also been placed more recently on dialysis and he has a recent fistula this might be part of the reason for the Plavix as well. Nonetheless in regard to the overall appearance of his wound things seem  to be doing decently well although there is some necrotic tissue we need to clean away I think this is something we get him to heal he just needs to make sure that he is getting up and moving around not sitting for extended periods of time. Patient does have a history of end-stage renal disease with dependence on renal dialysis, long-term use of anticoagulant therapy, sleep apnea, coronary artery disease, hypertension, and congestive heart failure. 08-14-2021 upon evaluation today patient appears to be doing well with regard to his wounds both are showing signs of improvement in just 1 week's time already I am very pleased with where we stand. With that being said I do believe that the patient would benefit from some sharp debridement today to clearway some of the necrotic debris he is in agreement with the plan and the good news is this does not hurt too severely and he is able to tolerate those debridements. Objective Constitutional Well-nourished and well-hydrated in no acute distress. Vitals Time Taken: 8:53 AM, Height: 68 in, Weight: 200 lbs, BMI: 30.4, Temperature: 97.7 F, Pulse: 89 bpm, Respiratory Rate: 18 breaths/min, Blood Pressure: 145/70 mmHg. Respiratory normal breathing without difficulty. Psychiatric this patient is able to make decisions and demonstrates good insight into disease process. Alert and Oriented x 3. pleasant and cooperative. General Notes: Upon inspection patient's wound bed actually showed signs of good granulation and epithelization at this point. Fortunately I do not see any evidence of active infection locally or systemically at this time which is great news and overall I am extremely pleased with where we stand currently. No fevers, chills, nausea, vomiting, or diarrhea. Postdebridement the wound beds appear to be doing much better at both locations. Integumentary (Hair, Skin) Wound #1 status is Open. Original cause of wound was Pressure Injury. The date  acquired was: 07/25/2020. The wound has been in treatment 1 weeks. The wound is located on the Right Gluteus. The wound measures 1.3cm length x 0.7cm width x 0.4cm depth; 0.715cm^2 area and 0.286cm^3 volume. There is Fat Layer (Subcutaneous Tissue) exposed. There is no tunneling noted, however, there is undermining starting at 12:00 and ending at 12:00 with a maximum distance of 0.1cm. There is a medium amount of serosanguineous drainage noted. The wound margin is distinct with the outline attached to the wound base. There is medium (34-66%) red granulation within the wound bed. There is a medium (34-66%) amount of necrotic tissue within the wound bed including Adherent Slough. Wound #2 status is Open. Original cause  of wound was Gradually Appeared. The date acquired was: 07/25/2020. The wound has been in treatment 1 weeks. The wound is located on the Left Gluteus. The wound measures 0.6cm length x 0.6cm width x 0.4cm depth; 0.283cm^2 area and 0.113cm^3 volume. There is Fat Layer (Subcutaneous Tissue) exposed. There is no tunneling noted, however, there is undermining starting at 12:00 and ending at 12:00 with a maximum distance of 0.1cm. There is a small amount of serosanguineous drainage noted. The wound margin is distinct with the outline attached to the wound base. There is medium (34-66%) red granulation within the wound bed. There is a medium (34-66%) amount of necrotic tissue within the wound bed including Adherent Slough. Assessment Active Problems ICD-10 Pressure ulcer of left buttock, stage 3 Pressure ulcer of right buttock, stage 3 Chronic combined systolic (congestive) and diastolic (congestive) heart failure Essential (primary) hypertension Atherosclerotic heart disease of native coronary artery without angina pectoris Sleep apnea, unspecified Long term (current) use of anticoagulants Dependence on renal dialysis End stage renal disease Procedures Wound #1 Pre-procedure diagnosis  of Wound #1 is a Pressure Ulcer located on the Right Gluteus . There was a Excisional Skin/Subcutaneous Tissue Debridement with a total area of 0.91 sq cm performed by Worthy Keeler, PA. With the following instrument(s): Curette to remove Viable and Non-Viable tissue/material. Material removed includes Subcutaneous Tissue and Slough and after achieving pain control using Other (benzocaine 20%). A time out was conducted at 09:30, prior to the start of the procedure. A Minimum amount of bleeding was controlled with Pressure. The procedure was tolerated well with a pain level of 0 throughout and a pain level of 0 following the procedure. Post Debridement Measurements: 1.3cm length x 0.7cm width x 0.4cm depth; 0.286cm^3 volume. Post debridement Stage noted as Category/Stage III. Character of Wound/Ulcer Post Debridement is improved. Post procedure Diagnosis Wound #1: Same as Pre-Procedure Wound #2 Pre-procedure diagnosis of Wound #2 is a Pressure Ulcer located on the Left Gluteus . There was a Excisional Skin/Subcutaneous Tissue Debridement with a total area of 0.36 sq cm performed by Worthy Keeler, PA. With the following instrument(s): Curette to remove Viable and Non-Viable tissue/material. Material removed includes Subcutaneous Tissue and Slough and after achieving pain control using Other (benzocaine 20%). A time out was conducted at 09:30, prior to the start of the procedure. A Minimum amount of bleeding was controlled with Pressure. The procedure was tolerated well with a pain level of 0 throughout and a pain level of 0 following the procedure. Post Debridement Measurements: 0.6cm length x 0.6cm width x 0.4cm depth; 0.113cm^3 volume. Post debridement Stage noted as Category/Stage III. Character of Wound/Ulcer Post Debridement is improved. Post procedure Diagnosis Wound #2: Same as Pre-Procedure Plan Follow-up Appointments: Return Appointment in 1 week. Jeri Cos, PA and [place], Room 8  Wednesday 3pm 08/21/2021 Other: - [employer]- to send wound care supplies. Bathing/ Shower/ Hygiene: May shower and wash wound with soap and water. Off-Loading: Other: - ensure to stand and/or walk every hour while sitting. use a piece of memory foam to sit home while sitting. WOUND #1: - Gluteus Wound Laterality: Right Cleanser: Soap and Water 1 x Per Day/30 Days Discharge Instructions: May shower and wash wound with dial antibacterial soap and water prior to dressing change. Cleanser: Wound Cleanser 1 x Per Day/30 Days Discharge Instructions: Cleanse the wound with wound cleanser prior to applying a clean dressing using gauze sponges, not tissue or cotton balls. Peri-Wound Care: Skin Prep 1 x  Per Day/30 Days Discharge Instructions: Use skin prep as directed Prim Dressing: Hydrofera Blue Ready Foam, 2.5 x2.5 in 1 x Per Day/30 Days ary Discharge Instructions: Apply to wound bed as instructed Secondary Dressing: Bordered Gauze, 2x2 in 1 x Per Day/30 Days Discharge Instructions: Apply over primary dressing as directed. WOUND #2: - Gluteus Wound Laterality: Left Cleanser: Soap and Water 1 x Per Day/30 Days Discharge Instructions: May shower and wash wound with dial antibacterial soap and water prior to dressing change. Cleanser: Wound Cleanser 1 x Per Day/30 Days Discharge Instructions: Cleanse the wound with wound cleanser prior to applying a clean dressing using gauze sponges, not tissue or cotton balls. Peri-Wound Care: Skin Prep 1 x Per Day/30 Days Discharge Instructions: Use skin prep as directed Prim Dressing: Hydrofera Blue Ready Foam, 2.5 x2.5 in 1 x Per Day/30 Days ary Discharge Instructions: Apply to wound bed Secondary Dressing: Bordered Gauze, 2x2 in 1 x Per Day/30 Days Discharge Instructions: Apply over primary dressing as directed. 1. I am going to suggest that we go ahead and continue with the wound care measures as before and the patient is in agreement with plan this  includes the use of the [facility] which I do believe has done well. We may consider switching over to collagen depending on how things go but for now we will continue with the balloon. 2. Would recommend a bordered foam dressing be continued. 3. I am also going to suggest the patient should continue to change this 3 times per week although daily it can be changed if need be and to some degree it seems like that is working out better for him at times. We will see patient back for reevaluation in 1 week here in the clinic. If anything worsens or changes patient will contact our office for additional recommendations. Electronic Signature(s) Signed: 08/14/2021 12:56:25 PM By: Worthy Keeler PA-C Entered By: Worthy Keeler on 08/14/2021 12:56:25 -------------------------------------------------------------------------------- SuperBill Details Patient Name: Date of Service: Peter Oneill, [place] 08/14/2021 Medical Record Number: [id number] Patient Account Number: [id number] Date of Birth/Sex: Treating RN: 1940-10-10 (81 y.o. 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 failure I10 Essential (primary) hypertension I25.10 Atherosclerotic heart disease of native coronary artery without angina pectoris G47.30 Sleep apnea, unspecified Z79.01 Long term (current) use of anticoagulants Z99.2 Dependence on renal dialysis N18.6 End stage renal disease Facility Procedures CPT4 Code: [id number] Description: 67209 - DEB SUBQ TISSUE 20 SQ CM/< ICD-10 Diagnosis Description L89.323 Pressure ulcer of left buttock, stage 3 L89.313 Pressure ulcer of right  buttock, stage 3 Modifier: Quantity: 1 Physician Procedures : CPT4 Code Description Modifier [id number] - WC PHYS SUBQ TISS 20 SQ CM ICD-10 Diagnosis Description L89.323 Pressure ulcer of left buttock, stage 3 L89.313 Pressure ulcer of right buttock, stage 3 Quantity: 1 Electronic Signature(s) Signed: 08/14/2021 12:56:55 PM By: Worthy Keeler PA-C Entered By: Worthy Keeler on 08/14/2021 12:56:54

## 2021-08-21 ENCOUNTER — Encounter (HOSPITAL_BASED_OUTPATIENT_CLINIC_OR_DEPARTMENT_OTHER): Payer: Medicare HMO | Admitting: Physician Assistant

## 2021-08-21 DIAGNOSIS — L89323 Pressure ulcer of left buttock, stage 3: Secondary | ICD-10-CM | POA: Diagnosis not present

## 2021-08-28 ENCOUNTER — Encounter (HOSPITAL_BASED_OUTPATIENT_CLINIC_OR_DEPARTMENT_OTHER): Payer: Medicare HMO | Attending: Physician Assistant | Admitting: Physician Assistant

## 2021-08-28 DIAGNOSIS — G4730 Sleep apnea, unspecified: Secondary | ICD-10-CM | POA: Insufficient documentation

## 2021-08-28 DIAGNOSIS — N186 End stage renal disease: Secondary | ICD-10-CM | POA: Insufficient documentation

## 2021-08-28 DIAGNOSIS — I132 Hypertensive heart and chronic kidney disease with heart failure and with stage 5 chronic kidney disease, or end stage renal disease: Secondary | ICD-10-CM | POA: Insufficient documentation

## 2021-08-28 DIAGNOSIS — L89323 Pressure ulcer of left buttock, stage 3: Secondary | ICD-10-CM | POA: Insufficient documentation

## 2021-08-28 DIAGNOSIS — I5042 Chronic combined systolic (congestive) and diastolic (congestive) heart failure: Secondary | ICD-10-CM | POA: Diagnosis not present

## 2021-08-28 DIAGNOSIS — L89313 Pressure ulcer of right buttock, stage 3: Secondary | ICD-10-CM | POA: Diagnosis not present

## 2021-08-28 DIAGNOSIS — Z7901 Long term (current) use of anticoagulants: Secondary | ICD-10-CM | POA: Diagnosis not present

## 2021-08-28 DIAGNOSIS — Z992 Dependence on renal dialysis: Secondary | ICD-10-CM | POA: Diagnosis not present

## 2021-08-28 DIAGNOSIS — I2510 Atherosclerotic heart disease of native coronary artery without angina pectoris: Secondary | ICD-10-CM | POA: Insufficient documentation

## 2021-08-28 NOTE — Progress Notes (Addendum)
MOHD., DERFLINGER ([id number]) Visit Report for 08/28/2021 Chief Complaint Document Details Patient Name: Date of Service: Peter Oneill [facility] 08/28/2021 9:00 [place] Record Number: [id number] Patient Account Number: [id number] Date of Birth/Sex: Treating RN: [birth year] (81 y.o. Peter Oneill Primary Care Provider: Juline Patch Other Clinician: Referring Provider: Treating Provider/Extender: Pleas Patricia in Treatment: 3 Information Obtained from: Patient Chief Complaint Bilateral gluteal pressure ulcers Electronic Signature(s) Signed: 08/28/2021 8:28:46 AM By: Worthy Keeler PA-C Entered By: Worthy Keeler on 08/28/2021 08:28:46 -------------------------------------------------------------------------------- Debridement Details Patient Name: Date of Service: Peter Oneill, [place] 08/28/2021 9:00 [place] Record Number: [id number] Patient Account Number: [id number] Date of Birth/Sex: Treating RN: 26-Apr-1940 (81 y.o. Peter Oneill Primary Care Provider: Juline Patch Other Clinician: Referring Provider: Treating Provider/Extender: Pleas Patricia in Treatment: 3 Debridement Performed for Assessment: Wound #1 Right Gluteus Performed By: Physician Worthy Keeler, PA Debridement Type: Debridement Level of Consciousness (Pre-procedure): Awake and Alert Pre-procedure Verification/Time Out Yes - 08:55 Taken: Start Time: 09:50 Pain Control: Lidocaine 4% T opical Solution T Area Debrided (L x W): otal 1.5 (cm) x 0.9 (cm) = 1.35 (cm) Tissue and other material debrided: Non-Viable, Slough, Subcutaneous, Slough Level: Skin/Subcutaneous Tissue Debridement Description: Excisional Instrument: Curette Bleeding: Minimum Hemostasis Achieved: Pressure Procedural Pain: 0 Post Procedural Pain: 0 Response to Treatment: Procedure was tolerated well Level of Consciousness (Post- Awake and Alert procedure): Post Debridement Measurements of  Total Wound Length: (cm) 1.5 Stage: Category/Stage III Width: (cm) 0.9 Depth: (cm) 0.5 Volume: (cm) 0.53 Character of Wound/Ulcer Post Debridement: Improved Post Procedure Diagnosis Same as Pre-procedure Electronic Signature(s) Signed: 08/28/2021 4:22:37 PM By: Worthy Keeler PA-C Signed: 08/28/2021 4:36:02 PM By: Sharyn Creamer RN, BSN Entered By: Sharyn Creamer on 08/28/2021 09:51:24 -------------------------------------------------------------------------------- Debridement Details Patient Name: Date of Service: Peter Oneill, [place] 08/28/2021 9:00 A M Medical Record Number: [id number] Patient Account Number: [id number] Date of Birth/Sex: Treating RN: 05/21/1940 (81 y.o. Peter Oneill Primary Care Provider: Juline Patch Other Clinician: Referring Provider: Treating Provider/Extender: Pleas Patricia in Treatment: 3 Debridement Performed for Assessment: Wound #2 Left Gluteus Performed By: Physician Worthy Keeler, PA Debridement Type: Debridement Level of Consciousness (Pre-procedure): Awake and Alert Pre-procedure Verification/Time Out Yes - 08:55 Taken: Start Time: 09:50 Pain Control: Lidocaine 4% T opical Solution T Area Debrided (L x W): otal 0.4 (cm) x 0.3 (cm) = 0.12 (cm) Tissue and other material debrided: Non-Viable, Slough, Subcutaneous, Slough Level: Skin/Subcutaneous Tissue Debridement Description: Excisional Instrument: Curette Bleeding: Minimum Hemostasis Achieved: Pressure Procedural Pain: 0 Post Procedural Pain: 0 Response to Treatment: Procedure was tolerated well Level of Consciousness (Post- Awake and Alert procedure): Post Debridement Measurements of Total Wound Length: (cm) 0.4 Stage: Category/Stage III Width: (cm) 0.3 Depth: (cm) 0.2 Volume: (cm) 0.019 Character of Wound/Ulcer Post Debridement: Improved Post Procedure Diagnosis Same as Pre-procedure Electronic Signature(s) Signed: 08/28/2021 4:22:37 PM By: Worthy Keeler PA-C Signed: 08/28/2021 4:36:02 PM By: Sharyn Creamer RN, BSN Entered By: Sharyn Creamer on 08/28/2021 09:51:57 -------------------------------------------------------------------------------- HPI Details Patient Name: Date of Service: Peter Oneill, [place] 08/28/2021 9:00 A M Medical Record Number: [id number] Patient Account Number: [id number] Date of Birth/Sex: Treating RN: 07-Aug-1940 (81 y.o. Peter Oneill Primary Care Provider: Juline Patch Other Clinician: Referring Provider: Treating Provider/Extender: Pleas Patricia in Treatment: 3 History of Present Illness HPI Description: 08-07-2021 upon evaluation today patient  appears to be doing somewhat poorly in regard to wounds that he has over the bilateral gluteal region and this has been present for about a month. It started out as a rash this cleared and then since then he has had 2 open spots that seem to be more pressure related he was referred from dermatology to [place] here at the wound care center. He does have a culture previous that showed 1 bacteria and 1 fungal organism he was treated for both. He also is on Plavix due to his history of heart disease in general. He is also been placed more recently on dialysis and he has a recent fistula this might be part of the reason for the Plavix as well. Nonetheless in regard to the overall appearance of his wound things seem to be doing decently well although there is some necrotic tissue we need to clean away I think this is something we get him to heal he just needs to make sure that he is getting up and moving around not sitting for extended periods of time. Patient does have a history of end-stage renal disease with dependence on renal dialysis, long-term use of anticoagulant therapy, sleep apnea, coronary artery disease, hypertension, and congestive heart failure. 08-14-2021 upon evaluation today patient appears to be doing well with regard to his wounds both  are showing signs of improvement in just 1 week's time already I am very pleased with where we stand. With that being said I do believe that the patient would benefit from some sharp debridement today to clearway some of the necrotic debris he is in agreement with the plan and the good news is this does not hurt too severely and he is able to tolerate those debridements. 08-21-2021 upon evaluation today patient appears to be doing well currently in regard to his wounds. Fortunately I do not see any signs of active infection which is great news and overall I am extremely pleased with where we stand today. With that being said there is a lot of irritation around the edges of the wounds that is the biggest concern that he has right now his wife especially is very worried about this. Fortunately I do not see any evidence of infection locally or systemically at this time. 08-28-2021 upon evaluation today patient's wounds are appearing cleaner but not really smaller. At this point I really think that we need to do something to try to get this to stimulate some additional tissue growth to that end I am going to see about going ahead and set the patient up for a switch to silver collagen dressing instead of the Hydrofera Blue. Him and his wife are both in agreement with the plan. This also will not need be changed as often as he will have to leave this in place in order to allow it to heal much more effectively and quickly. Electronic Signature(s) Signed: 08/28/2021 10:03:28 AM By: Worthy Keeler PA-C Entered By: Worthy Keeler on 08/28/2021 10:03:28 -------------------------------------------------------------------------------- Physical Exam Details Patient Name: Date of Service: Peter Oneill, [place] 08/28/2021 9:00 A M Medical Record Number: [id number] Patient Account Number: [id number] Date of Birth/Sex: Treating RN: May 31, 1940 (81 y.o. Peter Oneill Primary Care Provider: Juline Patch Other  Clinician: Referring Provider: Treating Provider/Extender: Pleas Patricia in Treatment: 3 Constitutional Well-nourished and well-hydrated in no acute distress. Respiratory normal breathing without difficulty. Psychiatric this patient is able to make decisions and demonstrates good insight into disease process.  Alert and Oriented x 3. pleasant and cooperative. Notes Patient's wound bed did require sharp debridement of clearway similar necrotic debris he tolerated this today without complication postdebridement the wound bed appears to be doing much better which is great news. Electronic Signature(s) Signed: 08/28/2021 10:03:46 AM By: Worthy Keeler PA-C Entered By: Worthy Keeler on 08/28/2021 10:03:46 -------------------------------------------------------------------------------- Physician Orders Details Patient Name: Date of Service: Peter Oneill, [place] 08/28/2021 9:00 [place] Record Number: [id number] Patient Account Number: [id number] Date of Birth/Sex: Treating RN: 1940/09/08 (81 y.o. Peter Oneill Primary Care Provider: Juline Patch Other Clinician: Referring Provider: Treating Provider/Extender: Pleas Patricia in Treatment: 3 Verbal / Phone Orders: No Diagnosis Coding ICD-10 Coding Code Description [phone] Pressure ulcer of left buttock, stage 3 L89.313 Pressure ulcer of right buttock, stage 3 I50.42 Chronic combined systolic (congestive) and diastolic (congestive) heart failure I10 Essential (primary) hypertension I25.10 Atherosclerotic heart disease of native coronary artery without angina pectoris G47.30 Sleep apnea, unspecified Z79.01 Long term (current) use of anticoagulants Z99.2 Dependence on renal dialysis N18.6 End stage renal disease Follow-up Appointments ppointment in 1 week. Jeri Cos, PA and [place], Room 8 Wednesday 09/04/2021 3pm Return A Other: - [employer]- to send wound care  supplies. Bathing/ Shower/ Hygiene May shower and wash wound with soap and water. Off-Loading Other: - ensure to stand and/or walk every hour while sitting. use a piece of memory foam to sit home while sitting. Wound Treatment Wound #1 - Gluteus Wound Laterality: Right Cleanser: Soap and Water 3 x Per Week/30 Days Discharge Instructions: May shower and wash wound with dial antibacterial soap and water prior to dressing change. Cleanser: Wound Cleanser 3 x Per Week/30 Days Discharge Instructions: Cleanse the wound with wound cleanser prior to applying a clean dressing using gauze sponges, not tissue or cotton balls. Peri-Wound Care: Skin Prep 3 x Per Week/30 Days Discharge Instructions: Use skin prep as directed Prim Dressing: Promogran Prisma Matrix, 4.34 (sq in) (silver collagen) 3 x Per Week/30 Days ary Discharge Instructions: Moisten collagen with saline or hydrogel Secondary Dressing: Bordered Gauze, 2x2 in 3 x Per Week/30 Days Discharge Instructions: Apply over primary dressing as directed. Wound #2 - Gluteus Wound Laterality: Left Cleanser: Soap and Water 3 x Per Week/30 Days Discharge Instructions: May shower and wash wound with dial antibacterial soap and water prior to dressing change. Cleanser: Wound Cleanser 3 x Per Week/30 Days Discharge Instructions: Cleanse the wound with wound cleanser prior to applying a clean dressing using gauze sponges, not tissue or cotton balls. Peri-Wound Care: Skin Prep 3 x Per Week/30 Days Discharge Instructions: Use skin prep as directed Prim Dressing: Promogran Prisma Matrix, 4.34 (sq in) (silver collagen) 3 x Per Week/30 Days ary Discharge Instructions: Moisten collagen with saline or hydrogel Secondary Dressing: Bordered Gauze, 2x2 in 3 x Per Week/30 Days Discharge Instructions: Apply over primary dressing as directed. Electronic Signature(s) Signed: 08/28/2021 4:22:37 PM By: Worthy Keeler PA-C Signed: 08/28/2021 4:36:02 PM By: Sharyn Creamer  RN, BSN Entered By: Sharyn Creamer on 08/28/2021 09:53:56 -------------------------------------------------------------------------------- Problem List Details Patient Name: Date of Service: Peter Oneill, [place] 08/28/2021 9:00 A M Medical Record Number: [id number] Patient Account Number: [id number] Date of Birth/Sex: Treating RN: [birth year] (81 y.o. Peter Oneill Primary Care Provider: Juline Patch Other Clinician: Referring Provider: Treating Provider/Extender: Pleas Patricia in Treatment: 3 Active Problems ICD-10 Encounter Code Description Active Date MDM Diagnosis [phone] Pressure  ulcer of left buttock, stage 3 08/07/2021 No Yes L89.313 Pressure ulcer of right buttock, stage 3 08/07/2021 No Yes I50.42 Chronic combined systolic (congestive) and diastolic (congestive) heart failure 08/07/2021 No Yes I10 Essential (primary) hypertension 08/07/2021 No Yes I25.10 Atherosclerotic heart disease of native coronary artery without angina pectoris 08/07/2021 No Yes G47.30 Sleep apnea, unspecified 08/07/2021 No Yes Z79.01 Long term (current) use of anticoagulants 08/07/2021 No Yes Z99.2 Dependence on renal dialysis 08/07/2021 No Yes N18.6 End stage renal disease 08/07/2021 No Yes Inactive Problems Resolved Problems Electronic Signature(s) Signed: 08/28/2021 8:28:41 AM By: Worthy Keeler PA-C Entered By: Worthy Keeler on 08/28/2021 08:28:41 -------------------------------------------------------------------------------- Progress Note Details Patient Name: Date of Service: Peter Oneill, [place] 08/28/2021 9:00 A M Medical Record Number: [id number] Patient Account Number: [id number] Date of Birth/Sex: Treating RN: 1940/09/19 (81 y.o. Peter Oneill Primary Care Provider: Juline Patch Other Clinician: Referring Provider: Treating Provider/Extender: Pleas Patricia in Treatment: 3 Subjective Chief Complaint Information obtained from  Patient Bilateral gluteal pressure ulcers History of Present Illness (HPI) 08-07-2021 upon evaluation today patient appears to be doing somewhat poorly in regard to wounds that he has over the bilateral gluteal region and this has been present for about a month. It started out as a rash this cleared and then since then he has had 2 open spots that seem to be more pressure related he was referred from dermatology to [place] here at the wound care center. He does have a culture previous that showed 1 bacteria and 1 fungal organism he was treated for both. He also is on Plavix due to his history of heart disease in general. He is also been placed more recently on dialysis and he has a recent fistula this might be part of the reason for the Plavix as well. Nonetheless in regard to the overall appearance of his wound things seem to be doing decently well although there is some necrotic tissue we need to clean away I think this is something we get him to heal he just needs to make sure that he is getting up and moving around not sitting for extended periods of time. Patient does have a history of end-stage renal disease with dependence on renal dialysis, long-term use of anticoagulant therapy, sleep apnea, coronary artery disease, hypertension, and congestive heart failure. 08-14-2021 upon evaluation today patient appears to be doing well with regard to his wounds both are showing signs of improvement in just 1 week's time already I am very pleased with where we stand. With that being said I do believe that the patient would benefit from some sharp debridement today to clearway some of the necrotic debris he is in agreement with the plan and the good news is this does not hurt too severely and he is able to tolerate those debridements. 08-21-2021 upon evaluation today patient appears to be doing well currently in regard to his wounds. Fortunately I do not see any signs of active infection which is great news and  overall I am extremely pleased with where we stand today. With that being said there is a lot of irritation around the edges of the wounds that is the biggest concern that he has right now his wife especially is very worried about this. Fortunately I do not see any evidence of infection locally or systemically at this time. 08-28-2021 upon evaluation today patient's wounds are appearing cleaner but not really smaller. At this point I really think that we  need to do something to try to get this to stimulate some additional tissue growth to that end I am going to see about going ahead and set the patient up for a switch to silver collagen dressing instead of the Hydrofera Blue. Him and his wife are both in agreement with the plan. This also will not need be changed as often as he will have to leave this in place in order to allow it to heal much more effectively and quickly. Objective Constitutional Well-nourished and well-hydrated in no acute distress. Vitals Time Taken: 9:19 AM, Height: 68 in, Weight: 200 lbs, BMI: 30.4, Temperature: 97.6 F, Pulse: 59 bpm, Respiratory Rate: 18 breaths/min, Blood Pressure: 99/47 mmHg. Respiratory normal breathing without difficulty. Psychiatric this patient is able to make decisions and demonstrates good insight into disease process. Alert and Oriented x 3. pleasant and cooperative. General Notes: Patient's wound bed did require sharp debridement of clearway similar necrotic debris he tolerated this today without complication postdebridement the wound bed appears to be doing much better which is great news. Integumentary (Hair, Skin) Wound #1 status is Open. Original cause of wound was Pressure Injury. The date acquired was: 07/25/2020. The wound has been in treatment 3 weeks. The wound is located on the Right Gluteus. The wound measures 1.5cm length x 0.9cm width x 0.5cm depth; 1.06cm^2 area and 0.53cm^3 volume. There is Fat Layer (Subcutaneous Tissue) exposed.  There is no tunneling or undermining noted. There is a medium amount of serosanguineous drainage noted. The wound margin is distinct with the outline attached to the wound base. There is small (1-33%) red granulation within the wound bed. There is a large (67-100%) amount of necrotic tissue within the wound bed including Adherent Slough. Wound #2 status is Open. Original cause of wound was Gradually Appeared. The date acquired was: 07/25/2020. The wound has been in treatment 3 weeks. The wound is located on the Left Gluteus. The wound measures 0.4cm length x 0.3cm width x 0.2cm depth; 0.094cm^2 area and 0.019cm^3 volume. There is Fat Layer (Subcutaneous Tissue) exposed. There is no tunneling or undermining noted. There is a medium amount of serosanguineous drainage noted. The wound margin is distinct with the outline attached to the wound base. There is small (1-33%) red granulation within the wound bed. There is a large (67-100%) amount of necrotic tissue within the wound bed including Adherent Slough. Assessment Active Problems ICD-10 Pressure ulcer of left buttock, stage 3 Pressure ulcer of right buttock, stage 3 Chronic combined systolic (congestive) and diastolic (congestive) heart failure Essential (primary) hypertension Atherosclerotic heart disease of native coronary artery without angina pectoris Sleep apnea, unspecified Long term (current) use of anticoagulants Dependence on renal dialysis End stage renal disease Procedures Wound #1 Pre-procedure diagnosis of Wound #1 is a Pressure Ulcer located on the Right Gluteus . There was a Excisional Skin/Subcutaneous Tissue Debridement with a total area of 1.35 sq cm performed by Worthy Keeler, PA. With the following instrument(s): Curette to remove Non-Viable tissue/material. Material removed includes Subcutaneous Tissue and Slough and after achieving pain control using Lidocaine 4% T opical Solution. No specimens were taken. A time out  was conducted at 08:55, prior to the start of the procedure. A Minimum amount of bleeding was controlled with Pressure. The procedure was tolerated well with a pain level of 0 throughout and a pain level of 0 following the procedure. Post Debridement Measurements: 1.5cm length x 0.9cm width x 0.5cm depth; 0.53cm^3 volume. Post debridement Stage noted as Category/Stage III.  Character of Wound/Ulcer Post Debridement is improved. Post procedure Diagnosis Wound #1: Same as Pre-Procedure Wound #2 Pre-procedure diagnosis of Wound #2 is a Pressure Ulcer located on the Left Gluteus . There was a Excisional Skin/Subcutaneous Tissue Debridement with a total area of 0.12 sq cm performed by Worthy Keeler, PA. With the following instrument(s): Curette to remove Non-Viable tissue/material. Material removed includes Subcutaneous Tissue and Slough and after achieving pain control using Lidocaine 4% T opical Solution. No specimens were taken. A time out was conducted at 08:55, prior to the start of the procedure. A Minimum amount of bleeding was controlled with Pressure. The procedure was tolerated well with a pain level of 0 throughout and a pain level of 0 following the procedure. Post Debridement Measurements: 0.4cm length x 0.3cm width x 0.2cm depth; 0.019cm^3 volume. Post debridement Stage noted as Category/Stage III. Character of Wound/Ulcer Post Debridement is improved. Post procedure Diagnosis Wound #2: Same as Pre-Procedure Plan Follow-up Appointments: Return Appointment in 1 week. Jeri Cos, PA and [place], Room 8 Wednesday 09/04/2021 3pm Other: - [employer]- to send wound care supplies. Bathing/ Shower/ Hygiene: May shower and wash wound with soap and water. Off-Loading: Other: - ensure to stand and/or walk every hour while sitting. use a piece of memory foam to sit home while sitting. WOUND #1: - Gluteus Wound Laterality: Right Cleanser: Soap and Water 3 x Per Week/30 Days Discharge  Instructions: May shower and wash wound with dial antibacterial soap and water prior to dressing change. Cleanser: Wound Cleanser 3 x Per Week/30 Days Discharge Instructions: Cleanse the wound with wound cleanser prior to applying a clean dressing using gauze sponges, not tissue or cotton balls. Peri-Wound Care: Skin Prep 3 x Per Week/30 Days Discharge Instructions: Use skin prep as directed Prim Dressing: Promogran Prisma Matrix, 4.34 (sq in) (silver collagen) 3 x Per Week/30 Days ary Discharge Instructions: Moisten collagen with saline or hydrogel Secondary Dressing: Bordered Gauze, 2x2 in 3 x Per Week/30 Days Discharge Instructions: Apply over primary dressing as directed. WOUND #2: - Gluteus Wound Laterality: Left Cleanser: Soap and Water 3 x Per Week/30 Days Discharge Instructions: May shower and wash wound with dial antibacterial soap and water prior to dressing change. Cleanser: Wound Cleanser 3 x Per Week/30 Days Discharge Instructions: Cleanse the wound with wound cleanser prior to applying a clean dressing using gauze sponges, not tissue or cotton balls. Peri-Wound Care: Skin Prep 3 x Per Week/30 Days Discharge Instructions: Use skin prep as directed Prim Dressing: Promogran Prisma Matrix, 4.34 (sq in) (silver collagen) 3 x Per Week/30 Days ary Discharge Instructions: Moisten collagen with saline or hydrogel Secondary Dressing: Bordered Gauze, 2x2 in 3 x Per Week/30 Days Discharge Instructions: Apply over primary dressing as directed. 1. I am going to suggest that we go ahead and continue with the wound care measures as before and the patient is in agreement with plan this includes the use of the bordered foam dressings to cover although we will switch to a silver collagen dressing for the actual treatment. 2. Also can recommend the patient continue with the appropriate offloading I feel like is doing a good job in that regard he just needs to continue to keep the course going. We  will see patient back for reevaluation in 1 week here in the clinic. If anything worsens or changes patient will contact our office for additional recommendations. Electronic Signature(s) Signed: 08/28/2021 10:04:16 AM By: Worthy Keeler PA-C Entered By: Worthy Keeler on  08/28/2021 10:04:16 -------------------------------------------------------------------------------- SuperBill Details Patient Name: Date of Service: Peter Oneill, [place] 08/28/2021 Medical Record Number: [id number] Patient Account Number: [id number] Date of Birth/Sex: Treating RN: 11/27/1940 (81 y.o. [name], [name] Primary Care Provider: Juline Patch Other Clinician: Referring Provider: Treating Provider/Extender: Pleas Patricia in Treatment: 3 Diagnosis Coding ICD-10 Codes Code Description [phone] Pressure ulcer of left buttock, stage 3 L89.313 Pressure ulcer of right buttock, stage 3 I50.42 Chronic combined systolic (congestive) and diastolic (congestive) heart failure I10 Essential (primary) hypertension I25.10 Atherosclerotic heart disease of native coronary artery without angina pectoris G47.30 Sleep apnea, unspecified Z79.01 Long term (current) use of anticoagulants Z99.2 Dependence on renal dialysis N18.6 End stage renal disease Facility Procedures CPT4 Code: [id number] Description: 41324 - DEB SUBQ TISSUE 20 SQ CM/< ICD-10 Diagnosis Description 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

## 2021-08-29 NOTE — Progress Notes (Signed)
STED[place], [place] ([id number]) Visit Report for 08/28/2021 Arrival Information Details Patient Name: Date of Service: Peter Oneill, [place] 08/28/2021 9:00 [place] Record Number: [id number] Patient Account Number: [id number] Date of Birth/Sex: Treating RN: [birth year] (81 y.o. Peter Oneill, [name] Primary Care Matson Welch: Juline Patch Other Clinician: Referring Manilla Strieter: Treating Keyshaun Exley/Extender: Pleas Patricia in Treatment: 3 Visit Information History Since Last Visit Added or deleted any medications: No Patient Arrived: [name] Any new allergies or adverse reactions: No Arrival Time: 09:18 Had a fall or experienced change in No Accompanied By: wife activities of daily living that may affect Transfer Assistance: None risk of falls: Patient Identification Verified: Yes Signs or symptoms of abuse/neglect since last visito No Secondary Verification Process Completed: Yes Hospitalized since last visit: No Patient Requires Transmission-Based Precautions: No Has Dressing in Place as Prescribed: Yes Patient Has Alerts: Yes Pain Present Now: No Patient Alerts: Patient on Blood Thinner Electronic Signature(s) Signed: 08/29/2021 8:35:29 AM By: Erenest Blank Entered By: Erenest Blank on 08/28/2021 09:19:14 -------------------------------------------------------------------------------- Encounter Discharge Information Details Patient Name: Date of Service: Peter Oneill, [place] 08/28/2021 9:00 [place] Record Number: [id number] Patient Account Number: [id number] Date of Birth/Sex: Treating RN: 01-14-1941 (81 y.o. Peter Oneill Primary Care Lorain Fettes: Juline Patch Other Clinician: Referring Oree Hislop: Treating Fabion Gatson/Extender: Pleas Patricia in Treatment: 3 Encounter Discharge Information Items Post Procedure Vitals Discharge Condition: Stable Temperature (F): 97.6 Ambulatory Status: Walker Pulse (bpm): 59 Discharge Destination:  Home Respiratory Rate (breaths/min): 18 Transportation: Private Auto Blood Pressure (mmHg): 99/47 Accompanied By: wife Schedule Follow-up Appointment: Yes Clinical Summary of Care: Patient Declined Electronic Signature(s) Signed: 08/28/2021 4:36:02 PM By: Sharyn Creamer RN, BSN Entered By: Sharyn Creamer on 08/28/2021 10:07:12 -------------------------------------------------------------------------------- Lower Extremity Assessment Details Patient Name: Date of Service: Peter Oneill, [place] 08/28/2021 9:00 [place] Record Number: [id number] Patient Account Number: [id number] Date of Birth/Sex: Treating RN: Jul 20, 1940 (81 y.o. Peter Oneill Primary Care Bralyn Espino: Juline Patch Other Clinician: Referring [name]: Treating [name]/Extender: Pleas Patricia in Treatment: 3 Electronic Signature(s) Signed: 08/29/2021 8:35:29 AM By: Erenest Blank Signed: 08/29/2021 4:18:35 PM By: Deon Pilling RN, BSN Entered By: Erenest Blank on 08/28/2021 09:20:04 -------------------------------------------------------------------------------- Multi-Disciplinary Care Plan Details Patient Name: Date of Service: Peter Oneill, [place] 08/28/2021 9:00 A M Medical Record Number: [id number] Patient Account Number: [id number] Date of Birth/Sex: Treating RN: [birth year] (81 y.o. Peter Oneill Primary Care [name]: Juline Patch Other Clinician: Referring [name]: Treating [name]/Extender: Pleas Patricia in Treatment: 3 Active Inactive Pain, Acute or Chronic Nursing Diagnoses: Pain, acute or chronic: actual or potential Potential alteration in comfort, pain Goals: Patient will verbalize adequate pain control and receive pain control interventions during procedures as needed Date Initiated: 08/07/2021 Target Resolution Date: 09/20/2021 Goal Status: Active Patient/caregiver will verbalize comfort level met Date Initiated: 08/07/2021 Target Resolution  Date: 09/20/2021 Goal Status: Active Interventions: Encourage patient to take pain medications as prescribed Provide education on pain management Treatment Activities: Administer pain control measures as ordered : 08/07/2021 Notes: Pressure Nursing Diagnoses: Knowledge deficit related to management of pressures ulcers Potential for impaired tissue integrity related to pressure, friction, moisture, and shear Goals: Patient will remain free from development of additional pressure ulcers Date Initiated: 08/07/2021 Target Resolution Date: 09/20/2021 Goal Status: Active Patient will remain free of pressure ulcers Date Initiated: 08/07/2021 Target Resolution Date: 09/20/2021 Goal Status: Active Patient/caregiver will verbalize understanding of pressure  ulcer management Date Initiated: 08/07/2021 Target Resolution Date: 09/20/2021 Goal Status: Active Interventions: Assess: immobility, friction, shearing, incontinence upon admission and as needed Assess offloading mechanisms upon admission and as needed Provide education on pressure ulcers Treatment Activities: Patient referred for pressure reduction/relief devices : 08/07/2021 Pressure reduction/relief device ordered : 08/07/2021 Notes: Electronic Signature(s) Signed: 08/28/2021 4:36:02 PM By: Sharyn Creamer RN, BSN Entered By: Sharyn Creamer on 08/28/2021 09:34:08 -------------------------------------------------------------------------------- Pain Assessment Details Patient Name: Date of Service: Peter Oneill, [place] 08/28/2021 9:00 [place] Record Number: [id number] Patient Account Number: [id number] Date of Birth/Sex: Treating RN: 1940-09-10 (81 y.o. Peter Oneill Primary Care Kc [place]: Juline Patch Other Clinician: Referring [name]: Treating Tameyah Koch/Extender: Pleas Patricia in Treatment: 3 Active Problems Location of Pain Severity and Description of Pain Patient Has Paino No Site Locations Pain  Management and Medication Current Pain Management: Electronic Signature(s) Signed: 08/29/2021 8:35:29 AM By: Erenest Blank Signed: 08/29/2021 4:18:35 PM By: Deon Pilling RN, BSN Entered By: Erenest Blank on 08/28/2021 09:19:56 -------------------------------------------------------------------------------- Patient/Caregiver Education Details Patient Name: Date of Service: Peter Oneill, [place] 7/5/2023andnbsp9:00 [place] Record Number: [id number] Patient Account Number: [id number] Date of Birth/Gender: Treating RN: [birth year] (81 y.o. Peter Oneill Primary Care Physician: Juline Patch Other Clinician: Referring Physician: Treating Physician/Extender: Pleas Patricia in Treatment: 3 Education Assessment Education Provided To: Patient Education Topics Provided Pressure: Methods: Explain/Verbal Responses: State content correctly Electronic Signature(s) Signed: 08/28/2021 4:36:02 PM By: Sharyn Creamer RN, BSN Entered By: Sharyn Creamer on 08/28/2021 09:34:28 -------------------------------------------------------------------------------- Wound Assessment Details Patient Name: Date of Service: Peter Oneill, [place] 08/28/2021 9:00 A M Medical Record Number: [id number] Patient Account Number: [id number] Date of Birth/Sex: Treating RN: 04-Mar-1940 (81 y.o. Peter Oneill, [name] Primary Care Tahjanae Blankenburg: Juline Patch Other Clinician: Referring Revan Gendron: Treating Caine Barfield/Extender: Pleas Patricia in Treatment: 3 Wound Status Wound Number: 1 Primary Pressure Ulcer Etiology: Wound Location: Right Gluteus Wound Open Wounding Event: Pressure Injury Status: Date Acquired: 07/25/2020 Comorbid Anemia, Sleep Apnea, Coronary Artery Disease, Hypertension, Weeks Of Treatment: 3 History: End Stage Renal Disease Clustered Wound: No Photos Wound Measurements Length: (cm) 1.5 Width: (cm) 0.9 Depth: (cm) 0.5 Area: (cm) 1.06 Volume: (cm) 0.53 %  Reduction in Area: -29.7% % Reduction in Volume: -62.1% Epithelialization: None Tunneling: No Undermining: No Wound Description Classification: Category/Stage III Wound Margin: Distinct, outline attached Exudate Amount: Medium Exudate Type: Serosanguineous Exudate Color: red, brown Foul Odor After Cleansing: No Slough/Fibrino Yes Wound Bed Granulation Amount: Small (1-33%) Exposed Structure Granulation Quality: Red Fascia Exposed: No Necrotic Amount: Large (67-100%) Fat Layer (Subcutaneous Tissue) Exposed: Yes Necrotic Quality: Adherent Slough Tendon Exposed: No Muscle Exposed: No Joint Exposed: No Bone Exposed: No Treatment Notes Wound #1 (Gluteus) Wound Laterality: Right Cleanser Soap and Water Discharge Instruction: May shower and wash wound with dial antibacterial soap and water prior to dressing change. Wound Cleanser Discharge Instruction: Cleanse the wound with wound cleanser prior to applying a clean dressing using gauze sponges, not tissue or cotton balls. Peri-Wound Care Skin Prep Discharge Instruction: Use skin prep as directed Topical Primary Dressing Promogran Prisma Matrix, 4.34 (sq in) (silver collagen) Discharge Instruction: Moisten collagen with saline or hydrogel Secondary Dressing Bordered Gauze, 2x2 in Discharge Instruction: Apply over primary dressing as directed. Secured With Compression Wrap Compression Stockings Add-Ons Electronic Signature(s) Signed: 08/29/2021 8:35:29 AM By: Erenest Blank Signed: 08/29/2021 4:18:35 PM By: Deon Pilling RN, BSN Entered By: Erenest Blank on 08/28/2021  09:27:50 -------------------------------------------------------------------------------- Wound Assessment Details Patient Name: Date of Service: [name] [facility] 08/28/2021 9:00 [place] Record Number: [id number] Patient Account Number: [id number] Date of Birth/Sex: Treating RN: 1940-10-08 (81 y.o. Peter Oneill, [name] Primary Care Akelia Husted: Juline Patch Other Clinician: Referring Adriana Quinby: Treating Breanda Greenlaw/Extender: Pleas Patricia in Treatment: 3 Wound Status Wound Number: 2 Primary Pressure Ulcer Etiology: Wound Location: Left Gluteus Wound Open Wounding Event: Gradually Appeared Status: Date Acquired: 07/25/2020 Comorbid Anemia, Sleep Apnea, Coronary Artery Disease, Hypertension, Weeks Of Treatment: 3 History: End Stage Renal Disease Clustered Wound: No Photos Wound Measurements Length: (cm) 0.4 Width: (cm) 0.3 Depth: (cm) 0.2 Area: (cm) 0.094 Volume: (cm) 0.019 % Reduction in Area: 78.6% % Reduction in Volume: 89.2% Epithelialization: Medium (34-66%) Tunneling: No Undermining: No Wound Description Classification: Category/Stage III Wound Margin: Distinct, outline attached Exudate Amount: Medium Exudate Type: Serosanguineous Exudate Color: red, brown Foul Odor After Cleansing: No Slough/Fibrino Yes Wound Bed Granulation Amount: Small (1-33%) Exposed Structure Granulation Quality: Red Fat Layer (Subcutaneous Tissue) Exposed: Yes Necrotic Amount: Large (67-100%) Necrotic Quality: Adherent Slough Treatment Notes Wound #2 (Gluteus) Wound Laterality: Left Cleanser Soap and Water Discharge Instruction: May shower and wash wound with dial antibacterial soap and water prior to dressing change. Wound Cleanser Discharge Instruction: Cleanse the wound with wound cleanser prior to applying a clean dressing using gauze sponges, not tissue or cotton balls. Peri-Wound Care Skin Prep Discharge Instruction: Use skin prep as directed Topical Primary Dressing Promogran Prisma Matrix, 4.34 (sq in) (silver collagen) Discharge Instruction: Moisten collagen with saline or hydrogel Secondary Dressing Bordered Gauze, 2x2 in Discharge Instruction: Apply over primary dressing as directed. Secured With Compression Wrap Compression Stockings Add-Ons Electronic Signature(s) Signed: 08/29/2021 8:35:29 AM  By: Erenest Blank Signed: 08/29/2021 4:18:35 PM By: Deon Pilling RN, BSN Entered By: Erenest Blank on 08/28/2021 09:28:22 -------------------------------------------------------------------------------- Vitals Details Patient Name: Date of Service: Peter Oneill, [place] 08/28/2021 9:00 A M Medical Record Number: [id number] Patient Account Number: [id number] Date of Birth/Sex: Treating RN: 07-Jan-1941 (81 y.o. Peter Oneill, [name] Primary Care Azriella Mattia: Juline Patch Other Clinician: Referring Jyron Turman: Treating Nyna Chilton/Extender: Pleas Patricia in Treatment: 3 Vital Signs Time Taken: 09:19 Temperature (F): 97.6 Height (in): 68 Pulse (bpm): 59 Weight (lbs): 200 Respiratory Rate (breaths/min): 18 Body Mass Index (BMI): 30.4 Blood Pressure (mmHg): 99/47 Reference Range: 80 - 120 mg / dl Electronic Signature(s) Signed: 08/29/2021 8:35:29 AM By: Erenest Blank Entered By: Erenest Blank on 08/28/2021 09:19:47

## 2021-09-04 ENCOUNTER — Encounter (HOSPITAL_BASED_OUTPATIENT_CLINIC_OR_DEPARTMENT_OTHER): Payer: Medicare HMO | Admitting: Physician Assistant

## 2021-09-04 DIAGNOSIS — L89323 Pressure ulcer of left buttock, stage 3: Secondary | ICD-10-CM | POA: Diagnosis not present

## 2021-09-04 NOTE — Progress Notes (Addendum)
LOUIE, FLENNER ([id number]) Visit Report for 09/04/2021 Chief Complaint Document Details Patient Name: Date of Service: Peter Oneill [facility] 09/04/2021 3:00 PM Medical Record Number: [id number] Patient Account Number: [id number] Date of Birth/Sex: Treating RN: 1940-12-18 (81 y.o. [name] Primary Care Provider: Juline Patch Other Clinician: Referring Provider: Treating Provider/Extender: Pleas Patricia in Treatment: 4 Information Obtained from: Patient Chief Complaint Bilateral gluteal pressure ulcers Electronic Signature(s) Signed: 09/04/2021 3:15:17 PM By: Worthy Keeler PA-C Entered By: Worthy Keeler on 09/04/2021 15:15:17 -------------------------------------------------------------------------------- HPI Details Patient Name: Date of Service: Peter Oneill, [place] 09/04/2021 3:00 PM Medical Record Number: [id number] Patient Account Number: [id number] Date of Birth/Sex: Treating RN: April 14, 1940 (81 y.o. [name] Primary Care Provider: Juline Patch Other Clinician: Referring Provider: Treating Provider/Extender: Pleas Patricia in Treatment: 4 History of Present Illness HPI Description: 08-07-2021 upon evaluation today patient appears to be doing somewhat poorly in regard to wounds that he has over the bilateral gluteal region and this has been present for about a month. It started out as a rash this cleared and then since then he has had 2 open spots that seem to be more pressure related he was referred from dermatology to [place] here at the wound care center. He does have a culture previous that showed 1 bacteria and 1 fungal organism he was treated for both. He also is on Plavix due to his history of heart disease in general. He is also been placed more recently on dialysis and he has a recent fistula this might be part of the reason for the Plavix as well. Nonetheless in regard to the overall appearance of his wound things  seem to be doing decently well although there is some necrotic tissue we need to clean away I think this is something we get him to heal he just needs to make sure that he is getting up and moving around not sitting for extended periods of time. Patient does have a history of end-stage renal disease with dependence on renal dialysis, long-term use of anticoagulant therapy, sleep apnea, coronary artery disease, hypertension, and congestive heart failure. 08-14-2021 upon evaluation today patient appears to be doing well with regard to his wounds both are showing signs of improvement in just 1 week's time already I am very pleased with where we stand. With that being said I do believe that the patient would benefit from some sharp debridement today to clearway some of the necrotic debris he is in agreement with the plan and the good news is this does not hurt too severely and he is able to tolerate those debridements. 08-21-2021 upon evaluation today patient appears to be doing well currently in regard to his wounds. Fortunately I do not see any signs of active infection which is great news and overall I am extremely pleased with where we stand today. With that being said there is a lot of irritation around the edges of the wounds that is the biggest concern that he has right now his wife especially is very worried about this. Fortunately I do not see any evidence of infection locally or systemically at this time. 08-28-2021 upon evaluation today patient's wounds are appearing cleaner but not really smaller. At this point I really think that we need to do something to try to get this to stimulate some additional tissue growth to that end I am going to see about going ahead and set the patient  up for a switch to silver collagen dressing instead of the Hydrofera Blue. Him and his wife are both in agreement with the plan. This also will not need be changed as often as he will have to leave this in place in  order to allow it to heal much more effectively and quickly. 09-04-2021 upon evaluation today patient appears to be doing excellent in regard to his wounds. He has been tolerating the dressing changes without complication and overall I am extremely pleased with where we stand currently. There does not appear to be any signs of active infection locally or systemically at this point which is great news. No fevers, chills, nausea, vomiting, or diarrhea. Electronic Signature(s) Signed: 09/04/2021 3:40:55 PM By: Worthy Keeler PA-C Entered By: Worthy Keeler on 09/04/2021 15:40:55 -------------------------------------------------------------------------------- Physical Exam Details Patient Name: Date of Service: Peter Oneill, [place] 09/04/2021 3:00 PM Medical Record Number: [id number] Patient Account Number: [id number] Date of Birth/Sex: Treating RN: February 17, 1941 (81 y.o. [name] Primary Care Provider: Juline Patch Other Clinician: Referring Provider: Treating Provider/Extender: Pleas Patricia in Treatment: 4 Constitutional Well-nourished and well-hydrated in no acute distress. Respiratory normal breathing without difficulty. Psychiatric this patient is able to make decisions and demonstrates good insight into disease process. Alert and Oriented x 3. pleasant and cooperative. Notes Upon inspection patient's wound bed actually showed signs of good granulation and epithelization at this point. Fortunately I do not see any evidence of infection locally or systemically which is great news and overall I am extremely pleased with where things stand currently. No sharp debridement was necessary and I do believe the collagen is far superior to what the [facility] was doing for him. Electronic Signature(s) Signed: 09/04/2021 3:41:11 PM By: Worthy Keeler PA-C Entered By: Worthy Keeler on 09/04/2021  15:41:10 -------------------------------------------------------------------------------- Physician Orders Details Patient Name: Date of Service: Peter Oneill, [place] 09/04/2021 3:00 PM Medical Record Number: [id number] Patient Account Number: [id number] Date of Birth/Sex: Treating RN: 10/24/1940 (81 y.o. [name], [name] Primary Care Provider: Juline Patch Other Clinician: Referring Provider: Treating Provider/Extender: Pleas Patricia in Treatment: 4 Verbal / Phone Orders: No Diagnosis Coding ICD-10 Coding Code Description [phone] Pressure ulcer of left buttock, stage 3 L89.313 Pressure ulcer of right buttock, stage 3 I50.42 Chronic combined systolic (congestive) and diastolic (congestive) heart failure I10 Essential (primary) hypertension I25.10 Atherosclerotic heart disease of native coronary artery without angina pectoris G47.30 Sleep apnea, unspecified Z79.01 Long term (current) use of anticoagulants Z99.2 Dependence on renal dialysis N18.6 End stage renal disease Follow-up Appointments ppointment in 1 week. Jeri Cos, PA and [place], Room 6 overflow Wednesday 09/11/2021 8am Return A Return appointment in 3 weeks. Jeri Cos, PA and [place], Room 8 Wednesday 09/25/2021 815am Other: - [employer]- to send wound care supplies. Bathing/ Shower/ Hygiene May shower and wash wound with soap and water. Off-Loading Other: - ensure to stand and/or walk every hour while sitting. use a piece of memory foam to sit home while sitting. Wound Treatment Wound #1 - Gluteus Wound Laterality: Right Cleanser: Soap and Water 3 x Per Week/30 Days Discharge Instructions: May shower and wash wound with dial antibacterial soap and water prior to dressing change. Cleanser: Wound Cleanser 3 x Per Week/30 Days Discharge Instructions: Cleanse the wound with wound cleanser prior to applying a clean dressing using gauze sponges, not tissue or cotton balls. Peri-Wound Care: Skin  Prep 3 x  Per Week/30 Days Discharge Instructions: Use skin prep as directed Prim Dressing: Promogran Prisma Matrix, 4.34 (sq in) (silver collagen) 3 x Per Week/30 Days ary Discharge Instructions: Moisten collagen with saline or hydrogel Secondary Dressing: Bordered Gauze, 2x2 in 3 x Per Week/30 Days Discharge Instructions: Apply over primary dressing as directed. Wound #2 - Gluteus Wound Laterality: Left Cleanser: Soap and Water 3 x Per Week/30 Days Discharge Instructions: May shower and wash wound with dial antibacterial soap and water prior to dressing change. Cleanser: Wound Cleanser 3 x Per Week/30 Days Discharge Instructions: Cleanse the wound with wound cleanser prior to applying a clean dressing using gauze sponges, not tissue or cotton balls. Peri-Wound Care: Skin Prep 3 x Per Week/30 Days Discharge Instructions: Use skin prep as directed Prim Dressing: Promogran Prisma Matrix, 4.34 (sq in) (silver collagen) 3 x Per Week/30 Days ary Discharge Instructions: Moisten collagen with saline or hydrogel Secondary Dressing: Bordered Gauze, 2x2 in 3 x Per Week/30 Days Discharge Instructions: Apply over primary dressing as directed. Electronic Signature(s) Signed: 09/04/2021 4:33:23 PM By: Worthy Keeler PA-C Signed: 09/04/2021 5:36:22 PM By: Deon Pilling RN, BSN Entered By: Deon Pilling on 09/04/2021 15:47:21 -------------------------------------------------------------------------------- Problem List Details Patient Name: Date of Service: Peter Oneill, [place] 09/04/2021 3:00 PM Medical Record Number: [id number] Patient Account Number: [id number] Date of Birth/Sex: Treating RN: 1940/06/26 (81 y.o. [name] Primary Care Provider: Juline Patch Other Clinician: Referring Provider: Treating Provider/Extender: Pleas Patricia in Treatment: 4 Active Problems ICD-10 Encounter Code Description Active Date MDM Diagnosis [phone] Pressure ulcer of left  buttock, stage 3 08/07/2021 No Yes L89.313 Pressure ulcer of right buttock, stage 3 08/07/2021 No Yes I50.42 Chronic combined systolic (congestive) and diastolic (congestive) heart failure 08/07/2021 No Yes I10 Essential (primary) hypertension 08/07/2021 No Yes I25.10 Atherosclerotic heart disease of native coronary artery without angina pectoris 08/07/2021 No Yes G47.30 Sleep apnea, unspecified 08/07/2021 No Yes Z79.01 Long term (current) use of anticoagulants 08/07/2021 No Yes Z99.2 Dependence on renal dialysis 08/07/2021 No Yes N18.6 End stage renal disease 08/07/2021 No Yes Inactive Problems Resolved Problems Electronic Signature(s) Signed: 09/04/2021 3:15:08 PM By: Worthy Keeler PA-C Entered By: Worthy Keeler on 09/04/2021 15:15:08 -------------------------------------------------------------------------------- Progress Note Details Patient Name: Date of Service: Peter Oneill, [place] 09/04/2021 3:00 PM Medical Record Number: [id number] Patient Account Number: [id number] Date of Birth/Sex: Treating RN: 09-03-1940 (81 y.o. [name] Primary Care Provider: Juline Patch Other Clinician: Referring Provider: Treating Provider/Extender: Pleas Patricia in Treatment: 4 Subjective Chief Complaint Information obtained from Patient Bilateral gluteal pressure ulcers History of Present Illness (HPI) 08-07-2021 upon evaluation today patient appears to be doing somewhat poorly in regard to wounds that he has over the bilateral gluteal region and this has been present for about a month. It started out as a rash this cleared and then since then he has had 2 open spots that seem to be more pressure related he was referred from dermatology to [place] here at the wound care center. He does have a culture previous that showed 1 bacteria and 1 fungal organism he was treated for both. He also is on Plavix due to his history of heart disease in general. He is also been placed more  recently on dialysis and he has a recent fistula this might be part of the reason for the Plavix as well. Nonetheless in regard to the overall appearance of his wound things seem to be  doing decently well although there is some necrotic tissue we need to clean away I think this is something we get him to heal he just needs to make sure that he is getting up and moving around not sitting for extended periods of time. Patient does have a history of end-stage renal disease with dependence on renal dialysis, long-term use of anticoagulant therapy, sleep apnea, coronary artery disease, hypertension, and congestive heart failure. 08-14-2021 upon evaluation today patient appears to be doing well with regard to his wounds both are showing signs of improvement in just 1 week's time already I am very pleased with where we stand. With that being said I do believe that the patient would benefit from some sharp debridement today to clearway some of the necrotic debris he is in agreement with the plan and the good news is this does not hurt too severely and he is able to tolerate those debridements. 08-21-2021 upon evaluation today patient appears to be doing well currently in regard to his wounds. Fortunately I do not see any signs of active infection which is great news and overall I am extremely pleased with where we stand today. With that being said there is a lot of irritation around the edges of the wounds that is the biggest concern that he has right now his wife especially is very worried about this. Fortunately I do not see any evidence of infection locally or systemically at this time. 08-28-2021 upon evaluation today patient's wounds are appearing cleaner but not really smaller. At this point I really think that we need to do something to try to get this to stimulate some additional tissue growth to that end I am going to see about going ahead and set the patient up for a switch to silver collagen dressing  instead of the Hydrofera Blue. Him and his wife are both in agreement with the plan. This also will not need be changed as often as he will have to leave this in place in order to allow it to heal much more effectively and quickly. 09-04-2021 upon evaluation today patient appears to be doing excellent in regard to his wounds. He has been tolerating the dressing changes without complication and overall I am extremely pleased with where we stand currently. There does not appear to be any signs of active infection locally or systemically at this point which is great news. No fevers, chills, nausea, vomiting, or diarrhea. Objective Constitutional Well-nourished and well-hydrated in no acute distress. Vitals Time Taken: 3:18 PM, Height: 68 in, Weight: 200 lbs, BMI: 30.4, Temperature: 98.5 F, Pulse: 80 bpm, Respiratory Rate: 18 breaths/min, Blood Pressure: 142/55 mmHg. Respiratory normal breathing without difficulty. Psychiatric this patient is able to make decisions and demonstrates good insight into disease process. Alert and Oriented x 3. pleasant and cooperative. General Notes: Upon inspection patient's wound bed actually showed signs of good granulation and epithelization at this point. Fortunately I do not see any evidence of infection locally or systemically which is great news and overall I am extremely pleased with where things stand currently. No sharp debridement was necessary and I do believe the collagen is far superior to what the [facility] was doing for him. Integumentary (Hair, Skin) Wound #1 status is Open. Original cause of wound was Pressure Injury. The date acquired was: 07/25/2020. The wound has been in treatment 4 weeks. The wound is located on the Right Gluteus. The wound measures 1.6cm length x 0.8cm width x 0.5cm depth; 1.005cm^2 area and  0.503cm^3 volume. There is Fat Layer (Subcutaneous Tissue) exposed. There is a medium amount of serosanguineous drainage noted. The wound  margin is distinct with the outline attached to the wound base. There is small (1-33%) red granulation within the wound bed. There is a large (67-100%) amount of necrotic tissue within the wound bed including Adherent Slough. Wound #2 status is Open. Original cause of wound was Gradually Appeared. The date acquired was: 07/25/2020. The wound has been in treatment 4 weeks. The wound is located on the Left Gluteus. The wound measures 0.4cm length x 0.2cm width x 0.2cm depth; 0.063cm^2 area and 0.013cm^3 volume. There is Fat Layer (Subcutaneous Tissue) exposed. There is no tunneling or undermining noted. There is a medium amount of serosanguineous drainage noted. The wound margin is distinct with the outline attached to the wound base. There is small (1-33%) red granulation within the wound bed. There is a large (67-100%) amount of necrotic tissue within the wound bed including Adherent Slough. Assessment Active Problems ICD-10 Pressure ulcer of left buttock, stage 3 Pressure ulcer of right buttock, stage 3 Chronic combined systolic (congestive) and diastolic (congestive) heart failure Essential (primary) hypertension Atherosclerotic heart disease of native coronary artery without angina pectoris Sleep apnea, unspecified Long term (current) use of anticoagulants Dependence on renal dialysis End stage renal disease Plan Follow-up Appointments: Return Appointment in 1 week. Jeri Cos, PA and [place], Room 8 Wednesday 09/11/2021 3pm Other: - [employer]- to send wound care supplies. Bathing/ Shower/ Hygiene: May shower and wash wound with soap and water. Off-Loading: Other: - ensure to stand and/or walk every hour while sitting. use a piece of memory foam to sit home while sitting. WOUND #1: - Gluteus Wound Laterality: Right Cleanser: Soap and Water 3 x Per Week/30 Days Discharge Instructions: May shower and wash wound with dial antibacterial soap and water prior to dressing  change. Cleanser: Wound Cleanser 3 x Per Week/30 Days Discharge Instructions: Cleanse the wound with wound cleanser prior to applying a clean dressing using gauze sponges, not tissue or cotton balls. Peri-Wound Care: Skin Prep 3 x Per Week/30 Days Discharge Instructions: Use skin prep as directed Prim Dressing: Promogran Prisma Matrix, 4.34 (sq in) (silver collagen) 3 x Per Week/30 Days ary Discharge Instructions: Moisten collagen with saline or hydrogel Secondary Dressing: Bordered Gauze, 2x2 in 3 x Per Week/30 Days Discharge Instructions: Apply over primary dressing as directed. WOUND #2: - Gluteus Wound Laterality: Left Cleanser: Soap and Water 3 x Per Week/30 Days Discharge Instructions: May shower and wash wound with dial antibacterial soap and water prior to dressing change. Cleanser: Wound Cleanser 3 x Per Week/30 Days Discharge Instructions: Cleanse the wound with wound cleanser prior to applying a clean dressing using gauze sponges, not tissue or cotton balls. Peri-Wound Care: Skin Prep 3 x Per Week/30 Days Discharge Instructions: Use skin prep as directed Prim Dressing: Promogran Prisma Matrix, 4.34 (sq in) (silver collagen) 3 x Per Week/30 Days ary Discharge Instructions: Moisten collagen with saline or hydrogel Secondary Dressing: Bordered Gauze, 2x2 in 3 x Per Week/30 Days Discharge Instructions: Apply over primary dressing as directed. 1. Would recommend currently that we go ahead and continue with the wound care measures as before and the patient is in agreement with plan. This includes the use of the silver collagen which I think is doing a great job. 2. I am also can recommend that we continue with the bordered foam dressing to cover which seems to be doing excellent. 3. I  would also recommend he continue with appropriate offloading overall I think he is doing awesome in that regard. We will see patient back for reevaluation in 1 week here in the clinic. If anything worsens  or changes patient will contact our office for additional recommendations. Electronic Signature(s) Signed: 09/04/2021 3:42:20 PM By: Worthy Keeler PA-C Entered By: Worthy Keeler on 09/04/2021 15:42:20 -------------------------------------------------------------------------------- SuperBill Details Patient Name: Date of Service: Peter Oneill, [place] 09/04/2021 Medical Record Number: [id number] Patient Account Number: [id number] Date of Birth/Sex: Treating RN: Jul 31, 1940 (81 y.o. [name], [name] Primary Care Provider: Juline Patch Other Clinician: Referring Provider: Treating Provider/Extender: Pleas Patricia in Treatment: 4 Diagnosis Coding ICD-10 Codes Code Description [phone] Pressure ulcer of left buttock, stage 3 L89.313 Pressure ulcer of right buttock, stage 3 I50.42 Chronic combined systolic (congestive) and diastolic (congestive) heart failure I10 Essential (primary) hypertension I25.10 Atherosclerotic heart disease of native coronary artery without angina pectoris G47.30 Sleep apnea, unspecified Z79.01 Long term (current) use of anticoagulants Z99.2 Dependence on renal dialysis N18.6 End stage renal disease Facility Procedures Physician Procedures : CPT4 Code Description Modifier [id number] - WC PHYS LEVEL 3 - EST PT ICD-10 Diagnosis Description L89.323 Pressure ulcer of left buttock, stage 3 L89.313 Pressure ulcer of right buttock, stage 3 I50.42 Chronic combined systolic (congestive) and  diastolic (congestive) heart failure I10 Essential (primary) hypertension Quantity: 1 Electronic Signature(s) Signed: 09/04/2021 3:42:39 PM By: Worthy Keeler PA-C Entered By: Worthy Keeler on 09/04/2021 15:42:38

## 2021-09-05 NOTE — Progress Notes (Signed)
MERLE, CIRELLI ([id number]) Visit Report for 09/04/2021 Arrival Information Details Patient Name: Date of Service: Peter Oneill, [place] 09/04/2021 3:00 PM Medical Record Number: [id number] Patient Account Number: [id number] Date of Birth/Sex: Treating RN: 11/14/1940 (81 y.o. [name] Primary Care Everest Hacking: Juline Patch Other Clinician: Referring Sundi Slevin: Treating Itzae Miralles/Extender: Pleas Patricia in Treatment: 4 Visit Information History Since Last Visit Added or deleted any medications: No Patient Arrived: [name] Any new allergies or adverse reactions: No Arrival Time: 15:17 Had a fall or experienced change in No Accompanied By: wife activities of daily living that may affect Transfer Assistance: None risk of falls: Patient Identification Verified: Yes Signs or symptoms of abuse/neglect since last visito No Secondary Verification Process Completed: Yes Hospitalized since last visit: No Patient Requires Transmission-Based Precautions: No Implantable device outside of the clinic excluding No Patient Has Alerts: Yes cellular tissue based products placed in the center Patient Alerts: Patient on Blood Thinner since last visit: Has Dressing in Place as Prescribed: Yes Pain Present Now: No Electronic Signature(s) Signed: 09/05/2021 4:37:01 PM By: Erenest Blank Entered By: Erenest Blank on 09/04/2021 15:17:59 -------------------------------------------------------------------------------- Clinic Level of Care Assessment Details Patient Name: Date of Service: [name] [facility] 09/04/2021 3:00 PM Medical Record Number: [id number] Patient Account Number: [id number] Date of Birth/Sex: Treating RN: 1940-05-26 (81 y.o. [name], [name] Primary Care Danta Baumgardner: Juline Patch Other Clinician: Referring Katrinna Travieso: Treating [place] Pettengill/Extender: Pleas Patricia in Treatment: 4 Clinic Level of Care Assessment Items TOOL 4 Quantity Score X-  1 0 Use when only an EandM is performed on FOLLOW-UP visit ASSESSMENTS - Nursing Assessment / Reassessment X- 1 10 Reassessment of Co-morbidities (includes updates in patient status) X- 1 5 Reassessment of Adherence to Treatment Plan ASSESSMENTS - Wound and Skin A ssessment / Reassessment '[]'  - 0 Simple Wound Assessment / Reassessment - one wound X- 2 5 Complex Wound Assessment / Reassessment - multiple wounds X- 1 10 Dermatologic / Skin Assessment (not related to wound area) ASSESSMENTS - Focused Assessment '[]'  - 0 Circumferential Edema Measurements - multi extremities '[]'  - 0 Nutritional Assessment / Counseling / Intervention '[]'  - 0 Lower Extremity Assessment (monofilament, tuning fork, pulses) '[]'  - 0 Peripheral Arterial Disease Assessment (using hand held doppler) ASSESSMENTS - Ostomy and/or Continence Assessment and Care '[]'  - 0 Incontinence Assessment and Management '[]'  - 0 Ostomy Care Assessment and Management (repouching, etc.) PROCESS - Coordination of Care '[]'  - 0 Simple Patient / Family Education for ongoing care X- 1 20 Complex (extensive) Patient / Family Education for ongoing care X- 1 10 Staff obtains Programmer, systems, Records, T Results / Process Orders est '[]'  - 0 Staff telephones HHA, Nursing Homes / Clarify orders / etc '[]'  - 0 Routine Transfer to another Facility (non-emergent condition) '[]'  - 0 Routine Hospital Admission (non-emergent condition) '[]'  - 0 New Admissions / Biomedical engineer / Ordering NPWT Apligraf, etc. , '[]'  - 0 Emergency Hospital Admission (emergent condition) '[]'  - 0 Simple Discharge Coordination X- 1 15 Complex (extensive) Discharge Coordination PROCESS - Special Needs '[]'  - 0 Pediatric / Minor Patient Management '[]'  - 0 Isolation Patient Management '[]'  - 0 Hearing / Language / Visual special needs '[]'  - 0 Assessment of Community assistance (transportation, D/C planning, etc.) '[]'  - 0 Additional assistance / Altered mentation '[]'  -  0 Support Surface(s) Assessment (bed, cushion, seat, etc.) INTERVENTIONS - Wound Cleansing / Measurement '[]'  - 0 Simple Wound Cleansing - one wound X- 2 5  Complex Wound Cleansing - multiple wounds X- 1 5 Wound Imaging (photographs - any number of wounds) '[]'  - 0 Wound Tracing (instead of photographs) '[]'  - 0 Simple Wound Measurement - one wound X- 2 5 Complex Wound Measurement - multiple wounds INTERVENTIONS - Wound Dressings X - Small Wound Dressing one or multiple wounds 2 10 '[]'  - 0 Medium Wound Dressing one or multiple wounds '[]'  - 0 Large Wound Dressing one or multiple wounds '[]'  - 0 Application of Medications - topical '[]'  - 0 Application of Medications - injection INTERVENTIONS - Miscellaneous '[]'  - 0 External ear exam '[]'  - 0 Specimen Collection (cultures, biopsies, blood, body fluids, etc.) '[]'  - 0 Specimen(s) / Culture(s) sent or taken to Lab for analysis '[]'  - 0 Patient Transfer (multiple staff / Civil Service fast streamer / Similar devices) '[]'  - 0 Simple Staple / Suture removal (25 or less) '[]'  - 0 Complex Staple / Suture removal (26 or more) '[]'  - 0 Hypo / Hyperglycemic Management (close monitor of Blood Glucose) '[]'  - 0 Ankle / Brachial Index (ABI) - do not check if billed separately X- 1 5 Vital Signs Has the patient been seen at the hospital within the last three years: Yes Total Score: 130 Level Of Care: New/Established - Level 4 Electronic Signature(s) Signed: 09/04/2021 5:36:22 PM By: Deon Pilling RN, BSN Entered By: Deon Pilling on 09/04/2021 15:37:58 -------------------------------------------------------------------------------- Encounter Discharge Information Details Patient Name: Date of Service: Peter Oneill, [place] 09/04/2021 3:00 PM Medical Record Number: [id number] Patient Account Number: [id number] Date of Birth/Sex: Treating RN: Dec 06, 1940 (81 y.o. [name] Primary Care Mazell Aylesworth: Juline Patch Other Clinician: Referring Sayda Grable: Treating  Brighid Koch/Extender: Pleas Patricia in Treatment: 4 Encounter Discharge Information Items Discharge Condition: Stable Ambulatory Status: Walker Discharge Destination: Home Transportation: Private Auto Accompanied By: wife Schedule Follow-up Appointment: Yes Clinical Summary of Care: Electronic Signature(s) Signed: 09/04/2021 5:36:22 PM By: Deon Pilling RN, BSN Entered By: Deon Pilling on 09/04/2021 15:38:26 -------------------------------------------------------------------------------- Lower Extremity Assessment Details Patient Name: Date of Service: Peter Oneill, [place] 09/04/2021 3:00 PM Medical Record Number: [id number] Patient Account Number: [id number] Date of Birth/Sex: Treating RN: [birth year] (81 y.o. [name] Primary Care Onda Kattner: Juline Patch Other Clinician: Referring Adelaido Nicklaus: Treating Princesa Willig/Extender: Pleas Patricia in Treatment: 4 Electronic Signature(s) Signed: 09/04/2021 5:36:22 PM By: Deon Pilling RN, BSN Signed: 09/05/2021 4:37:01 PM By: Erenest Blank Entered By: Erenest Blank on 09/04/2021 15:26:26 -------------------------------------------------------------------------------- Multi-Disciplinary Care Plan Details Patient Name: Date of Service: Peter Oneill, [place] 09/04/2021 3:00 PM Medical Record Number: [id number] Patient Account Number: [id number] Date of Birth/Sex: Treating RN: Mar 16, 1940 (81 y.o. [name] Primary Care Ayoub Arey: Juline Patch Other Clinician: Referring Chaske Paskett: Treating Cary Lothrop/Extender: Pleas Patricia in Treatment: 4 Active Inactive Pain, Acute or Chronic Nursing Diagnoses: Pain, acute or chronic: actual or potential Potential alteration in comfort, pain Goals: Patient will verbalize adequate pain control and receive pain control interventions during procedures as needed Date Initiated: 08/07/2021 Target Resolution Date: 09/20/2021 Goal Status:  Active Patient/caregiver will verbalize comfort level met Date Initiated: 08/07/2021 Target Resolution Date: 09/20/2021 Goal Status: Active Interventions: Encourage patient to take pain medications as prescribed Provide education on pain management Treatment Activities: Administer pain control measures as ordered : 08/07/2021 Notes: Pressure Nursing Diagnoses: Knowledge deficit related to management of pressures ulcers Potential for impaired tissue integrity related to pressure, friction, moisture, and shear Goals: Patient will remain free from development of  additional pressure ulcers Date Initiated: 08/07/2021 Target Resolution Date: 09/20/2021 Goal Status: Active Patient will remain free of pressure ulcers Date Initiated: 08/07/2021 Target Resolution Date: 09/20/2021 Goal Status: Active Patient/caregiver will verbalize understanding of pressure ulcer management Date Initiated: 08/07/2021 Target Resolution Date: 09/20/2021 Goal Status: Active Interventions: Assess: immobility, friction, shearing, incontinence upon admission and as needed Assess offloading mechanisms upon admission and as needed Provide education on pressure ulcers Treatment Activities: Patient referred for pressure reduction/relief devices : 08/07/2021 Pressure reduction/relief device ordered : 08/07/2021 Notes: Electronic Signature(s) Signed: 09/04/2021 5:36:22 PM By: Deon Pilling RN, BSN Entered By: Deon Pilling on 09/04/2021 15:36:31 -------------------------------------------------------------------------------- Pain Assessment Details Patient Name: Date of Service: Peter Oneill, [place] 09/04/2021 3:00 PM Medical Record Number: [id number] Patient Account Number: [id number] Date of Birth/Sex: Treating RN: [birth year] (81 y.o. [name] Primary Care Meliah Appleman: Juline Patch Other Clinician: Referring Vera Wishart: Treating Rayni Nemitz/Extender: Pleas Patricia in Treatment: 4 Active  Problems Location of Pain Severity and Description of Pain Patient Has Paino No Site Locations Pain Management and Medication Current Pain Management: Electronic Signature(s) Signed: 09/04/2021 5:36:22 PM By: Deon Pilling RN, BSN Signed: 09/05/2021 4:37:01 PM By: Erenest Blank Entered By: Erenest Blank on 09/04/2021 15:26:17 -------------------------------------------------------------------------------- Patient/Caregiver Education Details Patient Name: Date of Service: Peter Oneill, [place] 7/12/2023andnbsp3:00 PM Medical Record Number: [id number] Patient Account Number: [id number] Date of Birth/Gender: Treating RN: 08-Sep-1940 (81 y.o. [name] Primary Care Physician: Juline Patch Other Clinician: Referring Physician: Treating Physician/Extender: Pleas Patricia in Treatment: 4 Education Assessment Education Provided To: Patient Education Topics Provided Pressure: Handouts: Preventing Pressure Ulcers Methods: Explain/Verbal Responses: Reinforcements needed Electronic Signature(s) Signed: 09/04/2021 5:36:22 PM By: Deon Pilling RN, BSN Entered By: Deon Pilling on 09/04/2021 15:36:43 -------------------------------------------------------------------------------- Wound Assessment Details Patient Name: Date of Service: Peter Oneill, [place] 09/04/2021 3:00 PM Medical Record Number: [id number] Patient Account Number: [id number] Date of Birth/Sex: Treating RN: [birth year] (81 y.o. [name], [name] Primary Care Keyonta Barradas: Juline Patch Other Clinician: Referring Dejae Bernet: Treating Mikhail Hallenbeck/Extender: Pleas Patricia in Treatment: 4 Wound Status Wound Number: 1 Primary Pressure Ulcer Etiology: Wound Location: Right Gluteus Wound Open Wounding Event: Pressure Injury Status: Date Acquired: 07/25/2020 Comorbid Anemia, Sleep Apnea, Coronary Artery Disease, Hypertension, Weeks Of Treatment: 4 History: End Stage Renal  Disease Clustered Wound: No Photos Wound Measurements Length: (cm) 1.6 Width: (cm) 0.8 Depth: (cm) 0.5 Area: (cm) 1.005 Volume: (cm) 0.503 % Reduction in Area: -23% % Reduction in Volume: -53.8% Epithelialization: None Wound Description Classification: Category/Stage III Wound Margin: Distinct, outline attached Exudate Amount: Medium Exudate Type: Serosanguineous Exudate Color: red, brown Foul Odor After Cleansing: No Slough/Fibrino Yes Wound Bed Granulation Amount: Small (1-33%) Exposed Structure Granulation Quality: Red Fascia Exposed: No Necrotic Amount: Large (67-100%) Fat Layer (Subcutaneous Tissue) Exposed: Yes Necrotic Quality: Adherent Slough Tendon Exposed: No Muscle Exposed: No Joint Exposed: No Bone Exposed: No Treatment Notes Wound #1 (Gluteus) Wound Laterality: Right Cleanser Soap and Water Discharge Instruction: May shower and wash wound with dial antibacterial soap and water prior to dressing change. Wound Cleanser Discharge Instruction: Cleanse the wound with wound cleanser prior to applying a clean dressing using gauze sponges, not tissue or cotton balls. Peri-Wound Care Skin Prep Discharge Instruction: Use skin prep as directed Topical Primary Dressing Promogran Prisma Matrix, 4.34 (sq in) (silver collagen) Discharge Instruction: Moisten collagen with saline or hydrogel Secondary Dressing Bordered Gauze, 2x2 in Discharge Instruction: Apply over primary dressing as  directed. Secured With Compression Wrap Compression Stockings Environmental education officer) Signed: 09/04/2021 5:36:22 PM By: Deon Pilling RN, BSN Signed: 09/05/2021 4:37:01 PM By: Erenest Blank Entered By: Erenest Blank on 09/04/2021 15:30:03 -------------------------------------------------------------------------------- Wound Assessment Details Patient Name: Date of Service: Peter Oneill, [place] 09/04/2021 3:00 PM Medical Record Number: [id number] Patient Account Number:  [id number] Date of Birth/Sex: Treating RN: December 27, 1940 (81 y.o. [name], [name] Primary Care Clancey Welton: Juline Patch Other Clinician: Referring Koby Hartfield: Treating Shavonda Wiedman/Extender: Pleas Patricia in Treatment: 4 Wound Status Wound Number: 2 Primary Pressure Ulcer Etiology: Wound Location: Left Gluteus Wound Open Wounding Event: Gradually Appeared Status: Date Acquired: 07/25/2020 Comorbid Anemia, Sleep Apnea, Coronary Artery Disease, Hypertension, Weeks Of Treatment: 4 History: End Stage Renal Disease Clustered Wound: No Photos Wound Measurements Length: (cm) 0.4 Width: (cm) 0.2 Depth: (cm) 0.2 Area: (cm) 0.063 Volume: (cm) 0.013 % Reduction in Area: 85.7% % Reduction in Volume: 92.6% Epithelialization: Medium (34-66%) Tunneling: No Undermining: No Wound Description Classification: Category/Stage III Wound Margin: Distinct, outline attached Exudate Amount: Medium Exudate Type: Serosanguineous Exudate Color: red, brown Foul Odor After Cleansing: No Slough/Fibrino Yes Wound Bed Granulation Amount: Small (1-33%) Exposed Structure Granulation Quality: Red Fat Layer (Subcutaneous Tissue) Exposed: Yes Necrotic Amount: Large (67-100%) Necrotic Quality: Adherent Slough Treatment Notes Wound #2 (Gluteus) Wound Laterality: Left Cleanser Soap and Water Discharge Instruction: May shower and wash wound with dial antibacterial soap and water prior to dressing change. Wound Cleanser Discharge Instruction: Cleanse the wound with wound cleanser prior to applying a clean dressing using gauze sponges, not tissue or cotton balls. Peri-Wound Care Skin Prep Discharge Instruction: Use skin prep as directed Topical Primary Dressing Promogran Prisma Matrix, 4.34 (sq in) (silver collagen) Discharge Instruction: Moisten collagen with saline or hydrogel Secondary Dressing Bordered Gauze, 2x2 in Discharge Instruction: Apply over primary dressing as  directed. Secured With Compression Wrap Compression Stockings Environmental education officer) Signed: 09/04/2021 5:36:22 PM By: Deon Pilling RN, BSN Signed: 09/05/2021 4:37:01 PM By: Erenest Blank Entered By: Erenest Blank on 09/04/2021 15:30:29 -------------------------------------------------------------------------------- Vitals Details Patient Name: Date of Service: Peter Oneill, [place] 09/04/2021 3:00 PM Medical Record Number: [id number] Patient Account Number: [id number] Date of Birth/Sex: Treating RN: 04-22-1940 (81 y.o. [name], [name] Primary Care Lang Zingg: Juline Patch Other Clinician: Referring Natallia Stellmach: Treating Candra Wegner/Extender: Pleas Patricia in Treatment: 4 Vital Signs Time Taken: 15:18 Temperature (F): 98.5 Height (in): 68 Pulse (bpm): 80 Weight (lbs): 200 Respiratory Rate (breaths/min): 18 Body Mass Index (BMI): 30.4 Blood Pressure (mmHg): 142/55 Reference Range: 80 - 120 mg / dl Electronic Signature(s) Signed: 09/05/2021 4:37:01 PM By: Erenest Blank Entered By: Erenest Blank on 09/04/2021 15:19:18

## 2021-09-11 ENCOUNTER — Encounter (HOSPITAL_BASED_OUTPATIENT_CLINIC_OR_DEPARTMENT_OTHER): Payer: Medicare HMO | Admitting: Physician Assistant

## 2021-09-11 DIAGNOSIS — L89323 Pressure ulcer of left buttock, stage 3: Secondary | ICD-10-CM | POA: Diagnosis not present

## 2021-09-11 NOTE — Progress Notes (Signed)
TYHEEM, BOUGHNER ([id number]) Visit Report for 09/11/2021 Arrival Information Details Patient Name: Date of Service: Peter Oneill, Peter Oneill 09/11/2021 8:00 A M Medical Record Number: [id number] Patient Account Number: [id number] Date of Birth/Sex: Treating RN: [birth year] (81 y.o. Peter Oneill Primary Care Louan Base: Juline Patch Other Clinician: Referring Deajah Erkkila: Treating Durell Lofaso/Extender: Pleas Patricia in Treatment: 5 Visit Information History Since Last Visit Added or deleted any medications: No Patient Arrived: Peter Oneill Any new allergies or adverse reactions: No Arrival Time: 07:56 Had a fall or experienced change in No Accompanied By: self activities of daily living that may affect Transfer Assistance: None risk of falls: Patient Identification Verified: Yes Signs or symptoms of abuse/neglect since last visito No Secondary Verification Process Completed: Yes Hospitalized since last visit: No Patient Requires Transmission-Based Precautions: No Implantable device outside of the clinic excluding No Patient Has Alerts: Yes cellular tissue based products placed in the center Patient Alerts: Patient on Blood Thinner since last visit: Has Dressing in Place as Prescribed: Yes Pain Present Now: No Electronic Signature(s) Signed: 09/11/2021 4:20:13 PM By: Deon Pilling RN, BSN Entered By: Deon Pilling on 09/11/2021 07:57:29 -------------------------------------------------------------------------------- Encounter Discharge Information Details Patient Name: Date of Service: Peter Oneill, Peter Oneill 09/11/2021 8:00 A M Medical Record Number: [id number] Patient Account Number: [id number] Date of Birth/Sex: Treating RN: [birth year] (81 y.o. Peter Oneill Primary Care Nycholas Rayner: Juline Patch Other Clinician: Referring Kyeisha Janowicz: Treating Marquita Lias/Extender: Pleas Patricia in Treatment: 5 Encounter Discharge Information Items Post Procedure  Vitals Discharge Condition: Stable Temperature (F): 98.2 Ambulatory Status: Walker Pulse (bpm): 86 Discharge Destination: Home Respiratory Rate (breaths/min): 20 Transportation: Private Auto Blood Pressure (mmHg): 124/61 Accompanied By: wife Schedule Follow-up Appointment: Yes Clinical Summary of Care: Electronic Signature(s) Signed: 09/11/2021 4:20:13 PM By: Deon Pilling RN, BSN Entered By: Deon Pilling on 09/11/2021 08:34:19 -------------------------------------------------------------------------------- Lower Extremity Assessment Details Patient Name: Date of Service: Peter Oneill, Peter Oneill 09/11/2021 8:00 A M Medical Record Number: [id number] Patient Account Number: [id number] Date of Birth/Sex: Treating RN: 29-May-1940 (81 y.o. Peter Oneill Primary Care Danika Kluender: Juline Patch Other Clinician: Referring Avrohom Mckelvin: Treating Rylee Nuzum/Extender: Pleas Patricia in Treatment: 5 Electronic Signature(s) Signed: 09/11/2021 4:20:13 PM By: Deon Pilling RN, BSN Entered By: Deon Pilling on 09/11/2021 07:57:56 -------------------------------------------------------------------------------- [place] Details Patient Name: Date of Service: Peter Oneill, Peter Oneill 09/11/2021 8:00 A M Medical Record Number: [id number] Patient Account Number: [id number] Date of Birth/Sex: Treating RN: 03-Sep-1940 (81 y.o. Peter Oneill Primary Care Antonina Deziel: Juline Patch Other Clinician: Referring Kenshawn Maciolek: Treating Iyanni Hepp/Extender: Pleas Patricia in Treatment: 5 Active Inactive Pain, Acute or Chronic Nursing Diagnoses: Pain, acute or chronic: actual or potential Potential alteration in comfort, pain Goals: Patient will verbalize adequate pain control and receive pain control interventions during procedures as needed Date Initiated: 08/07/2021 Target Resolution Date: 09/20/2021 Goal Status: Active Patient/caregiver will verbalize  comfort level met Date Initiated: 08/07/2021 Target Resolution Date: 09/20/2021 Goal Status: Active Interventions: Encourage patient to take pain medications as prescribed Provide education on pain management Treatment Activities: Administer pain control measures as ordered : 08/07/2021 Notes: Pressure Nursing Diagnoses: Knowledge deficit related to management of pressures ulcers Potential for impaired tissue integrity related to pressure, friction, moisture, and shear Goals: Patient will remain free from development of additional pressure ulcers Date Initiated: 08/07/2021 Target Resolution Date: 09/20/2021 Goal Status: Active Patient will remain free of pressure ulcers Date Initiated: 08/07/2021 Target  Resolution Date: 09/20/2021 Goal Status: Active Patient/caregiver will verbalize understanding of pressure ulcer management Date Initiated: 08/07/2021 Target Resolution Date: 09/20/2021 Goal Status: Active Interventions: Assess: immobility, friction, shearing, incontinence upon admission and as needed Assess offloading mechanisms upon admission and as needed Provide education on pressure ulcers Treatment Activities: Patient referred for pressure reduction/relief devices : 08/07/2021 Pressure reduction/relief device ordered : 08/07/2021 Notes: Electronic Signature(s) Signed: 09/11/2021 4:20:13 PM By: Deon Pilling RN, BSN Entered By: Deon Pilling on 09/11/2021 08:27:06 -------------------------------------------------------------------------------- Pain Assessment Details Patient Name: Date of Service: Peter Oneill, Peter Oneill 09/11/2021 8:00 A M Medical Record Number: [id number] Patient Account Number: [id number] Date of Birth/Sex: Treating RN: Feb 21, 1941 (80 y.o. Peter Oneill Primary Care Latrel Szymczak: Juline Patch Other Clinician: Referring Syla Devoss: Treating Kiyo Heal/Extender: Pleas Patricia in Treatment: 5 Active Problems Location of Pain Severity and  Description of Pain Patient Has Paino No Site Locations Rate the pain. Current Pain Level: 0 Pain Management and Medication Current Pain Management: Medication: No Cold Application: No Rest: No Massage: No Activity: No T.E.N.S.: No Heat Application: No Leg drop or elevation: No Is the Current Pain Management Adequate: Adequate How does your wound impact your activities of daily livingo Sleep: No Bathing: No Appetite: No Relationship With Others: No Bladder Continence: No Emotions: No Bowel Continence: No Work: No Toileting: No Drive: No Dressing: No Hobbies: No Engineer, maintenance) Signed: 09/11/2021 4:20:13 PM By: Deon Pilling RN, BSN Entered By: Deon Pilling on 09/11/2021 07:57:51 -------------------------------------------------------------------------------- Patient/Caregiver Education Details Patient Name: Date of Service: Peter Oneill, Peter Oneill 7/19/2023andnbsp8:00 [place] Record Number: [id number] Patient Account Number: [id number] Date of Birth/Gender: Treating RN: May 08, 1940 (81 y.o. Peter Oneill Primary Care Physician: Juline Patch Other Clinician: Referring Physician: Treating Physician/Extender: Pleas Patricia in Treatment: 5 Education Assessment Education Provided To: Patient Education Topics Provided Pain: Handouts: A Guide to Pain Control Methods: Explain/Verbal Responses: Reinforcements needed Electronic Signature(s) Signed: 09/11/2021 4:20:13 PM By: Deon Pilling RN, BSN Entered By: Deon Pilling on 09/11/2021 08:27:19 -------------------------------------------------------------------------------- Wound Assessment Details Patient Name: Date of Service: Peter Oneill, Peter Oneill 09/11/2021 8:00 A M Medical Record Number: [id number] Patient Account Number: [id number] Date of Birth/Sex: Treating RN: Oct 13, 1940 (81 y.o. [name], [name] Primary Care Rhiann Boucher: Juline Patch Other Clinician: Referring  Orissa Arreaga: Treating Lucynda Rosano/Extender: Pleas Patricia in Treatment: 5 Wound Status Wound Number: 1 Primary Pressure Ulcer Etiology: Wound Location: Right Gluteus Wound Open Wounding Event: Pressure Injury Status: Date Acquired: 07/25/2020 Comorbid Anemia, Sleep Apnea, Coronary Artery Disease, Hypertension, Weeks Of Treatment: 5 History: End Stage Renal Disease Clustered Wound: No Photos Wound Measurements Length: (cm) 1.1 Width: (cm) 1 Depth: (cm) 0.5 Area: (cm) 0.864 Volume: (cm) 0.432 % Reduction in Area: -5.8% % Reduction in Volume: -32.1% Epithelialization: None Tunneling: No Undermining: No Wound Description Classification: Category/Stage III Wound Margin: Epibole Exudate Amount: Medium Exudate Type: Serosanguineous Exudate Color: red, brown Foul Odor After Cleansing: No Slough/Fibrino Yes Wound Bed Granulation Amount: Small (1-33%) Exposed Structure Granulation Quality: Red Fascia Exposed: No Necrotic Amount: Large (67-100%) Fat Layer (Subcutaneous Tissue) Exposed: Yes Necrotic Quality: Adherent Slough Tendon Exposed: No Muscle Exposed: No Joint Exposed: No Bone Exposed: No Treatment Notes Wound #1 (Gluteus) Wound Laterality: Right Cleanser Soap and Water Discharge Instruction: May shower and wash wound with dial antibacterial soap and water prior to dressing change. Wound Cleanser Discharge Instruction: Cleanse the wound with wound cleanser prior to applying a clean dressing using gauze  sponges, not tissue or cotton balls. Peri-Wound Care Skin Prep Discharge Instruction: Use skin prep as directed Topical Primary Dressing Promogran Prisma Matrix, 4.34 (sq in) (silver collagen) Discharge Instruction: Moisten collagen with saline or hydrogel Secondary Dressing Bordered Gauze, 2x2 in Discharge Instruction: Apply over primary dressing as directed. Secured With Compression Wrap Compression Stockings Sport and exercise psychologist) Signed: 09/11/2021 4:20:13 PM By: Deon Pilling RN, BSN Entered By: Deon Pilling on 09/11/2021 08:05:35 -------------------------------------------------------------------------------- Wound Assessment Details Patient Name: Date of Service: Peter Oneill, Peter Oneill 09/11/2021 8:00 A M Medical Record Number: [id number] Patient Account Number: [id number] Date of Birth/Sex: Treating RN: June 07, 1940 (81 y.o. [name], [name] Primary Care Robin Pafford: Juline Patch Other Clinician: Referring Dianne Whelchel: Treating Zoejane Gaulin/Extender: Pleas Patricia in Treatment: 5 Wound Status Wound Number: 2 Primary Pressure Ulcer Etiology: Wound Location: Left Gluteus Wound Open Wounding Event: Gradually Appeared Status: Date Acquired: 07/25/2020 Comorbid Anemia, Sleep Apnea, Coronary Artery Disease, Hypertension, Weeks Of Treatment: 5 History: End Stage Renal Disease Clustered Wound: No Photos Wound Measurements Length: (cm) 0.1 Width: (cm) 0.1 Depth: (cm) 0.1 Area: (cm) 0.008 Volume: (cm) 0.001 % Reduction in Area: 98.2% % Reduction in Volume: 99.4% Epithelialization: Large (67-100%) Tunneling: No Undermining: No Wound Description Classification: Category/Stage III Wound Margin: Distinct, outline attached Exudate Amount: None Present Foul Odor After Cleansing: No Slough/Fibrino No Wound Bed Granulation Amount: None Present (0%) Exposed Structure Necrotic Amount: None Present (0%) Fascia Exposed: No Fat Layer (Subcutaneous Tissue) Exposed: No Tendon Exposed: No Muscle Exposed: No Joint Exposed: No Bone Exposed: No Treatment Notes Wound #2 (Gluteus) Wound Laterality: Left Cleanser Soap and Water Discharge Instruction: May shower and wash wound with dial antibacterial soap and water prior to dressing change. Peri-Wound Care Topical AandD Discharge Instruction: apply ointment once to twice a day to area for protection. Primary Dressing Secondary  Dressing Secured With Compression Wrap Compression Stockings Add-Ons Electronic Signature(s) Signed: 09/11/2021 4:20:13 PM By: Deon Pilling RN, BSN Entered By: Deon Pilling on 09/11/2021 08:05:59 -------------------------------------------------------------------------------- Vitals Details Patient Name: Date of Service: Peter Oneill, Peter Oneill 09/11/2021 8:00 A M Medical Record Number: [id number] Patient Account Number: [id number] Date of Birth/Sex: Treating RN: [birth year] (81 y.o. [name], [name] Primary Care Gorman Safi: Juline Patch Other Clinician: Referring Tedford Berg: Treating Amar Sippel/Extender: Pleas Patricia in Treatment: 5 Vital Signs Time Taken: 07:55 Temperature (F): 98.2 Height (in): 68 Pulse (bpm): 86 Weight (lbs): 200 Respiratory Rate (breaths/min): 20 Body Mass Index (BMI): 30.4 Blood Pressure (mmHg): 124/61 Reference Range: 80 - 120 mg / dl Electronic Signature(s) Signed: 09/11/2021 4:20:13 PM By: Deon Pilling RN, BSN Entered By: Deon Pilling on 09/11/2021 07:57:44

## 2021-09-11 NOTE — Progress Notes (Addendum)
[name], [name] ([id number]) Visit Report for 09/11/2021 Chief Complaint Document Details Patient Name: Date of Service: [name] [facility] 09/11/2021 8:00 A M Medical Record Number: [id number] Patient Account Number: [id number] Date of Birth/Sex: Treating RN: 1940/10/27 (81 y.o. [name] Primary Care Provider: Juline Patch Other Clinician: Referring Provider: Treating Provider/Extender: Pleas Patricia in Treatment: 5 Information Obtained from: Patient Chief Complaint Bilateral gluteal pressure ulcers Electronic Signature(s) Signed: 09/11/2021 8:25:39 AM By: Worthy Keeler PA-C Entered By: Worthy Keeler on 09/11/2021 08:25:39 -------------------------------------------------------------------------------- Problem List Details Patient Name: Date of Service: [name], [place] 09/11/2021 8:00 A M Medical Record Number: [id number] Patient Account Number: [id number] Date of Birth/Sex: Treating RN: [birth year] (81 y.o. [name] Primary Care Provider: Juline Patch Other Clinician: Referring Provider: Treating Provider/Extender: Pleas Patricia in Treatment: 5 Active Problems ICD-10 Encounter Code Description Active Date MDM Diagnosis [phone] Pressure ulcer of left buttock, stage 3 08/07/2021 No Yes L89.313 Pressure ulcer of right buttock, stage 3 08/07/2021 No Yes I50.42 Chronic combined systolic (congestive) and diastolic (congestive) heart failure 08/07/2021 No Yes I10 Essential (primary) hypertension 08/07/2021 No Yes I25.10 Atherosclerotic heart disease of native coronary artery without angina pectoris 08/07/2021 No Yes G47.30 Sleep apnea, unspecified 08/07/2021 No Yes Z79.01 Long term (current) use of anticoagulants 08/07/2021 No Yes Z99.2 Dependence on renal dialysis 08/07/2021 No Yes N18.6 End stage renal disease 08/07/2021 No Yes Inactive Problems Resolved Problems Electronic Signature(s) Signed: 09/11/2021 8:25:02 AM  By: Worthy Keeler PA-C Entered By: Worthy Keeler on 09/11/2021 08:25:02

## 2021-09-25 ENCOUNTER — Encounter (HOSPITAL_BASED_OUTPATIENT_CLINIC_OR_DEPARTMENT_OTHER): Payer: Medicare HMO | Attending: Physician Assistant | Admitting: Physician Assistant

## 2021-09-25 DIAGNOSIS — L89323 Pressure ulcer of left buttock, stage 3: Secondary | ICD-10-CM | POA: Insufficient documentation

## 2021-09-25 DIAGNOSIS — Z992 Dependence on renal dialysis: Secondary | ICD-10-CM | POA: Diagnosis not present

## 2021-09-25 DIAGNOSIS — I5042 Chronic combined systolic (congestive) and diastolic (congestive) heart failure: Secondary | ICD-10-CM | POA: Diagnosis not present

## 2021-09-25 DIAGNOSIS — N186 End stage renal disease: Secondary | ICD-10-CM | POA: Diagnosis not present

## 2021-09-25 DIAGNOSIS — I132 Hypertensive heart and chronic kidney disease with heart failure and with stage 5 chronic kidney disease, or end stage renal disease: Secondary | ICD-10-CM | POA: Diagnosis not present

## 2021-09-25 DIAGNOSIS — Z7901 Long term (current) use of anticoagulants: Secondary | ICD-10-CM | POA: Insufficient documentation

## 2021-09-25 DIAGNOSIS — Z7902 Long term (current) use of antithrombotics/antiplatelets: Secondary | ICD-10-CM | POA: Diagnosis not present

## 2021-09-25 DIAGNOSIS — L89313 Pressure ulcer of right buttock, stage 3: Secondary | ICD-10-CM | POA: Insufficient documentation

## 2021-09-25 DIAGNOSIS — I2510 Atherosclerotic heart disease of native coronary artery without angina pectoris: Secondary | ICD-10-CM | POA: Diagnosis not present

## 2021-09-25 DIAGNOSIS — G4730 Sleep apnea, unspecified: Secondary | ICD-10-CM | POA: Diagnosis not present

## 2021-09-25 NOTE — Progress Notes (Signed)
NAHEIM, BURGEN ([id number]) Visit Report for 09/25/2021 Arrival Information Details Patient Name: Date of Service: [name] [facility] 09/25/2021 8:15 A M Medical Record Number: [id number] Patient Account Number: [id number] Date of Birth/Sex: Treating RN: May 11, 1940 (81 y.o. Marcheta Grammes Primary Care Magally Vahle: Juline Patch Other Clinician: Referring Ranvir Renovato: Treating Damontay Alred/Extender: Pleas Patricia in Treatment: 7 Visit Information History Since Last Visit Added or deleted any medications: No Patient Arrived: [name] Any new allergies or adverse reactions: No Arrival Time: 07:53 Had a fall or experienced change in No Accompanied By: wife activities of daily living that may affect Transfer Assistance: None risk of falls: Patient Identification Verified: Yes Signs or symptoms of abuse/neglect since last visito No Secondary Verification Process Completed: Yes Hospitalized since last visit: No Patient Requires Transmission-Based Precautions: No Implantable device outside of the clinic excluding No Patient Has Alerts: Yes cellular tissue based products placed in the center Patient Alerts: Patient on Blood Thinner since last visit: Has Dressing in Place as Prescribed: Yes Pain Present Now: No Electronic Signature(s) Signed: 09/25/2021 5:05:50 PM By: Lorrin Jackson Entered By: Lorrin Jackson on 09/25/2021 07:56:18 -------------------------------------------------------------------------------- Encounter Discharge Information Details Patient Name: Date of Service: [name], [place] 09/25/2021 8:15 A M Medical Record Number: [id number] Patient Account Number: [id number] Date of Birth/Sex: Treating RN: 27-May-1940 (81 y.o. Marcheta Grammes Primary Care Stepehn Eckard: Juline Patch Other Clinician: Referring Emiya Loomer: Treating Elena Cothern/Extender: Pleas Patricia in Treatment: 7 Encounter Discharge Information Items Post Procedure  Vitals Discharge Condition: Stable Temperature (F): 98.3 Ambulatory Status: Walker Pulse (bpm): 80 Discharge Destination: Home Respiratory Rate (breaths/min): 18 Transportation: Private Auto Blood Pressure (mmHg): 124/66 Accompanied By: wife Schedule Follow-up Appointment: Yes Clinical Summary of Care: Provided on 09/25/2021 Form Type Recipient Paper Patient Patient Electronic Signature(s) Signed: 09/25/2021 5:05:50 PM By: Lorrin Jackson Entered By: Lorrin Jackson on 09/25/2021 08:22:18 -------------------------------------------------------------------------------- Lower Extremity Assessment Details Patient Name: Date of Service: [name], [place] 09/25/2021 8:15 A M Medical Record Number: [id number] Patient Account Number: [id number] Date of Birth/Sex: Treating RN: 29-Aug-1940 (81 y.o. Marcheta Grammes Primary Care Maeley Matton: Juline Patch Other Clinician: Referring Jalasia Eskridge: Treating Tannar Broker/Extender: Pleas Patricia in Treatment: 7 Electronic Signature(s) Signed: 09/25/2021 5:05:50 PM By: Lorrin Jackson Entered By: Lorrin Jackson on 09/25/2021 07:57:20 -------------------------------------------------------------------------------- Multi-Disciplinary Care Plan Details Patient Name: Date of Service: [name], [place] 09/25/2021 8:15 A M Medical Record Number: [id number] Patient Account Number: [id number] Date of Birth/Sex: Treating RN: 08/18/1940 (81 y.o. [name] Primary Care Deara Bober: Juline Patch Other Clinician: Referring Whittaker Lenis: Treating Mercadez Heitman/Extender: Pleas Patricia in Treatment: 7 Active Inactive Pain, Acute or Chronic Nursing Diagnoses: Pain, acute or chronic: actual or potential Potential alteration in comfort, pain Goals: Patient will verbalize adequate pain control and receive pain control interventions during procedures as needed Date Initiated: 08/07/2021 Target Resolution Date: 10/25/2021 Goal  Status: Active Patient/caregiver will verbalize comfort level met Date Initiated: 08/07/2021 Target Resolution Date: 10/25/2021 Goal Status: Active Interventions: Encourage patient to take pain medications as prescribed Provide education on pain management Treatment Activities: Administer pain control measures as ordered : 08/07/2021 Notes: Pressure Nursing Diagnoses: Knowledge deficit related to management of pressures ulcers Potential for impaired tissue integrity related to pressure, friction, moisture, and shear Goals: Patient will remain free from development of additional pressure ulcers Date Initiated: 08/07/2021 Target Resolution Date: 10/11/2021 Goal Status: Active Patient will remain free of pressure ulcers Date  Initiated: 08/07/2021 Target Resolution Date: 10/17/2021 Goal Status: Active Patient/caregiver will verbalize understanding of pressure ulcer management Date Initiated: 08/07/2021 Target Resolution Date: 10/17/2021 Goal Status: Active Interventions: Assess: immobility, friction, shearing, incontinence upon admission and as needed Assess offloading mechanisms upon admission and as needed Provide education on pressure ulcers Treatment Activities: Patient referred for pressure reduction/relief devices : 08/07/2021 Pressure reduction/relief device ordered : 08/07/2021 Notes: Electronic Signature(s) Signed: 09/25/2021 5:05:50 PM By: Lorrin Jackson Signed: 09/25/2021 6:08:09 PM By: Deon Pilling RN, BSN Entered By: Lorrin Jackson on 09/25/2021 08:07:27 -------------------------------------------------------------------------------- Pain Assessment Details Patient Name: Date of Service: [name], [place] 09/25/2021 8:15 A M Medical Record Number: [id number] Patient Account Number: [id number] Date of Birth/Sex: Treating RN: [birth year] (81 y.o. Marcheta Grammes Primary Care Kimla Furth: Juline Patch Other Clinician: Referring Jaeveon Ashland: Treating Sheril Hammond/Extender: Pleas Patricia in Treatment: 7 Active Problems Location of Pain Severity and Description of Pain Patient Has Paino No Site Locations Pain Management and Medication Current Pain Management: Electronic Signature(s) Signed: 09/25/2021 5:05:50 PM By: Lorrin Jackson Entered By: Lorrin Jackson on 09/25/2021 07:57:14 -------------------------------------------------------------------------------- Patient/Caregiver Education Details Patient Name: Date of Service: [name], [place] 8/2/2023andnbsp8:15 A M Medical Record Number: [id number] Patient Account Number: [id number] Date of Birth/Gender: Treating RN: 08/30/1940 (81 y.o. [name] Primary Care Physician: Juline Patch Other Clinician: Referring Physician: Treating Physician/Extender: Pleas Patricia in Treatment: 7 Education Assessment Education Provided To: Patient and Caregiver Education Topics Provided Pressure: Handouts: Pressure Ulcers: Care and Offloading, Preventing Pressure Ulcers Methods: Explain/Verbal Responses: Reinforcements needed Electronic Signature(s) Signed: 09/25/2021 6:08:09 PM By: Deon Pilling RN, BSN Entered By: Deon Pilling on 09/25/2021 08:02:04 -------------------------------------------------------------------------------- Wound Assessment Details Patient Name: Date of Service: [name], [place] 09/25/2021 8:15 A M Medical Record Number: [id number] Patient Account Number: [id number] Date of Birth/Sex: Treating RN: 1940/07/20 (81 y.o. Marcheta Grammes Primary Care Omni Dunsworth: Juline Patch Other Clinician: Referring Takari Duncombe: Treating Annabella Elford/Extender: Pleas Patricia in Treatment: 7 Wound Status Wound Number: 1 Primary Pressure Ulcer Etiology: Wound Location: Right Gluteus Wound Open Wounding Event: Pressure Injury Status: Date Acquired: 07/25/2020 Comorbid Anemia, Sleep Apnea, Coronary Artery Disease, Hypertension, Weeks Of  Treatment: 7 History: End Stage Renal Disease Clustered Wound: No Photos Wound Measurements Length: (cm) 1.1 Width: (cm) 0.6 Depth: (cm) 0.4 Area: (cm) 0.518 Volume: (cm) 0.207 % Reduction in Area: 36.6% % Reduction in Volume: 36.7% Epithelialization: Small (1-33%) Tunneling: No Undermining: No Wound Description Classification: Category/Stage III Wound Margin: Epibole Exudate Amount: Medium Exudate Type: Serosanguineous Exudate Color: red, brown Foul Odor After Cleansing: No Slough/Fibrino Yes Wound Bed Granulation Amount: Large (67-100%) Exposed Structure Granulation Quality: Red, Pink Fascia Exposed: No Necrotic Amount: Small (1-33%) Fat Layer (Subcutaneous Tissue) Exposed: Yes Necrotic Quality: Adherent Slough Tendon Exposed: No Muscle Exposed: No Joint Exposed: No Bone Exposed: No Assessment Notes maceration noted Treatment Notes Wound #1 (Gluteus) Wound Laterality: Right Cleanser Soap and Water Discharge Instruction: May shower and wash wound with dial antibacterial soap and water prior to dressing change. Wound Cleanser Discharge Instruction: Cleanse the wound with wound cleanser prior to applying a clean dressing using gauze sponges, not tissue or cotton balls. Peri-Wound Care Skin Prep Discharge Instruction: Use skin prep as directed Topical Primary Dressing Promogran Prisma Matrix, 4.34 (sq in) (silver collagen) Discharge Instruction: Moisten collagen with saline or hydrogel Secondary Dressing Bordered Gauze, 2x2 in Discharge Instruction: Apply over primary dressing as directed. Woven Gauze Sponges 2x2  in Discharge Instruction: Apply over Prisma to hold in place Secured With Compression Wrap Compression Stockings Add-Ons Electronic Signature(s) Signed: 09/25/2021 5:05:50 PM By: Lorrin Jackson Entered By: Lorrin Jackson on 09/25/2021 08:03:43 -------------------------------------------------------------------------------- Wound Assessment  Details Patient Name: Date of Service: [name], [place] 09/25/2021 8:15 A M Medical Record Number: [id number] Patient Account Number: [id number] Date of Birth/Sex: Treating RN: May 03, 1940 (81 y.o. Marcheta Grammes Primary Care Crewe Heathman: Other Clinician: Juline Patch Referring Eutha Cude: Treating Rand Boller/Extender: Pleas Patricia in Treatment: 7 Wound Status Wound Number: 2 Primary Pressure Ulcer Etiology: Wound Location: Left Gluteus Wound Healed - Epithelialized Wounding Event: Gradually Appeared Status: Date Acquired: 07/25/2020 Comorbid Anemia, Sleep Apnea, Coronary Artery Disease, Hypertension, Weeks Of Treatment: 7 History: End Stage Renal Disease Clustered Wound: No Photos Wound Measurements Length: (cm) Width: (cm) Depth: (cm) Area: (cm) Volume: (cm) 0 % Reduction in Area: 100% 0 % Reduction in Volume: 100% 0 0 0 Wound Description Classification: Category/Stage III Electronic Signature(s) Signed: 09/25/2021 5:05:50 PM By: Lorrin Jackson Entered By: Lorrin Jackson on 09/25/2021 08:04:13 -------------------------------------------------------------------------------- Vitals Details Patient Name: Date of Service: [name], [place] [facility] 09/25/2021 8:15 A M Medical Record Number: [id number] Patient Account Number: [id number] Date of Birth/Sex: Treating RN: 05-Apr-1940 (81 y.o. Marcheta Grammes Primary Care Elysa Womac: Juline Patch Other Clinician: Referring Virginia Francisco: Treating Therin Vetsch/Extender: Pleas Patricia in Treatment: 7 Vital Signs Time Taken: 07:56 Temperature (F): 98.3 Height (in): 68 Pulse (bpm): 80 Weight (lbs): 200 Respiratory Rate (breaths/min): 18 Body Mass Index (BMI): 30.4 Blood Pressure (mmHg): 124/66 Reference Range: 80 - 120 mg / dl Electronic Signature(s) Signed: 09/25/2021 5:05:50 PM By: Lorrin Jackson Entered By: Lorrin Jackson on 09/25/2021 07:57:07

## 2021-09-25 NOTE — Progress Notes (Signed)
ANTHANY, THORNHILL ([id number]) Visit Report for 09/25/2021 Chief Complaint Document Details Patient Name: Date of Service: [name] 09/25/2021 8:15 A M Medical Record Number: [id number] Patient Account Number: [id number] Date of Birth/Sex: Treating RN: Jan 19, 1941 (81 y.o. M) Primary Care Provider: Juline Patch Other Clinician: Referring Provider: Treating Provider/Extender: Pleas Patricia in Treatment: 7 Information Obtained from: Patient Chief Complaint Bilateral gluteal pressure ulcers Electronic Signature(s) Signed: 09/25/2021 8:57:10 AM By: Worthy Keeler PA-C Entered By: Worthy Keeler on 09/25/2021 08:57:09 -------------------------------------------------------------------------------- Debridement Details Patient Name: Date of Service: [name], [place] 09/25/2021 8:15 A M Medical Record Number: [id number] Patient Account Number: [id number] Date of Birth/Sex: Treating RN: 06/29/1940 (81 y.o. Marcheta Grammes Primary Care Provider: Juline Patch Other Clinician: Referring Provider: Treating Provider/Extender: Pleas Patricia in Treatment: 7 Debridement Performed for Assessment: Wound #1 Right Gluteus Performed By: Physician Worthy Keeler, PA Debridement Type: Debridement Level of Consciousness (Pre-procedure): Awake and Alert Pre-procedure Verification/Time Out Yes - 08:11 Taken: Start Time: 08:12 Pain Control: Lidocaine 4% T opical Solution T Area Debrided (L x W): otal 1.1 (cm) x 0.6 (cm) = 0.66 (cm) Tissue and other material debrided: Non-Viable, Slough, Subcutaneous, Skin: Dermis , Slough Level: Skin/Subcutaneous Tissue Debridement Description: Excisional Instrument: Curette Bleeding: Minimum End Time: 08:17 Response to Treatment: Procedure was tolerated well Level of Consciousness (Post- Awake and Alert procedure): Post Debridement Measurements of Total Wound Length: (cm) 1.1 Stage: Category/Stage  III Width: (cm) 0.6 Depth: (cm) 0.4 Volume: (cm) 0.207 Character of Wound/Ulcer Post Debridement: Stable Post Procedure Diagnosis Same as Pre-procedure Electronic Signature(s) Signed: 09/25/2021 5:04:53 PM By: Worthy Keeler PA-C Signed: 09/25/2021 5:05:50 PM By: Lorrin Jackson Entered By: Lorrin Jackson on 09/25/2021 08:36:20 -------------------------------------------------------------------------------- HPI Details Patient Name: Date of Service: [name], [place] [facility] 09/25/2021 8:15 A M Medical Record Number: [id number] Patient Account Number: [id number] Date of Birth/Sex: Treating RN: January 20, 1941 (81 y.o. M) Primary Care Provider: Juline Patch Other Clinician: Referring Provider: Treating Provider/Extender: Pleas Patricia in Treatment: 7 History of Present Illness HPI Description: 08-07-2021 upon evaluation today patient appears to be doing somewhat poorly in regard to wounds that he has over the bilateral gluteal region and this has been present for about a month. It started out as a rash this cleared and then since then he has had 2 open spots that seem to be more pressure related he was referred from dermatology to [place] here at the wound care center. He does have a culture previous that showed 1 bacteria and 1 fungal organism he was treated for both. He also is on Plavix due to his history of heart disease in general. He is also been placed more recently on dialysis and he has a recent fistula this might be part of the reason for the Plavix as well. Nonetheless in regard to the overall appearance of his wound things seem to be doing decently well although there is some necrotic tissue we need to clean away I think this is something we get him to heal he just needs to make sure that he is getting up and moving around not sitting for extended periods of time. Patient does have a history of end-stage renal disease with dependence on renal dialysis, long-term use of  anticoagulant therapy, sleep apnea, coronary artery disease, hypertension, and congestive heart failure. 08-14-2021 upon evaluation today patient appears to be doing well with regard to his wounds  both are showing signs of improvement in just 1 week's time already I am very pleased with where we stand. With that being said I do believe that the patient would benefit from some sharp debridement today to clearway some of the necrotic debris he is in agreement with the plan and the good news is this does not hurt too severely and he is able to tolerate those debridements. 08-21-2021 upon evaluation today patient appears to be doing well currently in regard to his wounds. Fortunately I do not see any signs of active infection which is great news and overall I am extremely pleased with where we stand today. With that being said there is a lot of irritation around the edges of the wounds that is the biggest concern that he has right now his wife especially is very worried about this. Fortunately I do not see any evidence of infection locally or systemically at this time. 08-28-2021 upon evaluation today patient's wounds are appearing cleaner but not really smaller. At this point I really think that we need to do something to try to get this to stimulate some additional tissue growth to that end I am going to see about going ahead and set the patient up for a switch to silver collagen dressing instead of the Hydrofera Blue. Him and his wife are both in agreement with the plan. This also will not need be changed as often as he will have to leave this in place in order to allow it to heal much more effectively and quickly. 09-04-2021 upon evaluation today patient appears to be doing excellent in regard to his wounds. He has been tolerating the dressing changes without complication and overall I am extremely pleased with where we stand currently. There does not appear to be any signs of active infection locally  or systemically at this point which is great news. No fevers, chills, nausea, vomiting, or diarrhea. 09-11-2021 upon evaluation today patient's wound on the left gluteal region actually appears to be completely closed based on what I am seeing. In regard to the right gluteal region this still has some signs of an open wound at this point unfortunately and I do believe that the patient would benefit from continuation of the collagen dressing at this time. 09-25-2021 upon evaluation today patient appears to be doing well currently in regard to his wound. This is actually measuring smaller which is great news is also not nearly as deep. Fortunately I think we are on the right track. There is going to be some need for sharp debridement. Electronic Signature(s) Signed: 09/25/2021 1:54:45 PM By: Worthy Keeler PA-C Entered By: Worthy Keeler on 09/25/2021 13:54:45 -------------------------------------------------------------------------------- Physical Exam Details Patient Name: Date of Service: [name], [place] 09/25/2021 8:15 A M Medical Record Number: [id number] Patient Account Number: [id number] Date of Birth/Sex: Treating RN: 03-02-1940 (81 y.o. M) Primary Care Provider: Juline Patch Other Clinician: Referring Provider: Treating Provider/Extender: Pleas Patricia in Treatment: 7 Constitutional Well-nourished and well-hydrated in no acute distress. Respiratory normal breathing without difficulty. Psychiatric this patient is able to make decisions and demonstrates good insight into disease process. Alert and Oriented x 3. pleasant and cooperative. Notes Upon inspection patient's wound bed actually showed signs of good granulation and epithelization at this point. Fortunately I do not see any evidence of infection which is great news and overall I am extremely pleased. With that being said I do believe that the patient is making good  progress here. Electronic  Signature(s) Signed: 09/25/2021 1:55:02 PM By: Worthy Keeler PA-C Entered By: Worthy Keeler on 09/25/2021 13:55:02 -------------------------------------------------------------------------------- Physician Orders Details Patient Name: Date of Service: [name], [place] 09/25/2021 8:15 A M Medical Record Number: [id number] Patient Account Number: [id number] Date of Birth/Sex: Treating RN: [birth year] (81 y.o. [name], [name] Primary Care Provider: Juline Patch Other Clinician: Referring Provider: Treating Provider/Extender: Pleas Patricia in Treatment: 7 Verbal / Phone Orders: No Diagnosis Coding ICD-10 Coding Code Description [phone] Pressure ulcer of left buttock, stage 3 L89.313 Pressure ulcer of right buttock, stage 3 I50.42 Chronic combined systolic (congestive) and diastolic (congestive) heart failure I10 Essential (primary) hypertension I25.10 Atherosclerotic heart disease of native coronary artery without angina pectoris G47.30 Sleep apnea, unspecified Z79.01 Long term (current) use of anticoagulants Z99.2 Dependence on renal dialysis N18.6 End stage renal disease Follow-up Appointments ppointment in 1 week. Jeri Cos, PA and [place], Room 8 Wednesday 10/02/2021 8:15am Return A Return appointment in 3 weeks. Jeri Cos, PA and [place], Room 8 Wednesday 10/16/2021 8:15am Other: - [employer]- to send wound care supplies. Cellular or Tissue Based Products Wound #1 Right Gluteus Cellular or Tissue Based Product Type: - wound care to run insurance authorization for [employer]- insurance denied. Bathing/ Shower/ Hygiene May shower and wash wound with soap and water. Off-Loading Other: - ensure to stand and/or walk every hour while sitting. use a piece of memory foam to sit home while sitting. Wound Treatment Wound #1 - Gluteus Wound Laterality: Right Cleanser: Soap and Water 3 x Per Week/30 Days Discharge Instructions: May shower and wash wound  with dial antibacterial soap and water prior to dressing change. Cleanser: Wound Cleanser 3 x Per Week/30 Days Discharge Instructions: Cleanse the wound with wound cleanser prior to applying a clean dressing using gauze sponges, not tissue or cotton balls. Peri-Wound Care: Skin Prep 3 x Per Week/30 Days Discharge Instructions: Use skin prep as directed Prim Dressing: Promogran Prisma Matrix, 4.34 (sq in) (silver collagen) 3 x Per Week/30 Days ary Discharge Instructions: Moisten collagen with saline or hydrogel Secondary Dressing: Bordered Gauze, 2x2 in 3 x Per Week/30 Days Discharge Instructions: Apply over primary dressing as directed. Secondary Dressing: Woven Gauze Sponges 2x2 in 3 x Per Week/30 Days Discharge Instructions: Apply over Prisma to hold in place Electronic Signature(s) Signed: 09/25/2021 5:04:53 PM By: Worthy Keeler PA-C Signed: 09/25/2021 5:05:50 PM By: Lorrin Jackson Entered By: Lorrin Jackson on 09/25/2021 08:25:48 -------------------------------------------------------------------------------- Problem List Details Patient Name: Date of Service: [name], [place] 09/25/2021 8:15 A M Medical Record Number: [id number] Patient Account Number: [id number] Date of Birth/Sex: Treating RN: 16-Jan-1941 (81 y.o. [name] Primary Care Provider: Juline Patch Other Clinician: Referring Provider: Treating Provider/Extender: Pleas Patricia in Treatment: 7 Active Problems ICD-10 Encounter Code Description Active Date MDM Diagnosis [phone] Pressure ulcer of left buttock, stage 3 08/07/2021 No Yes L89.313 Pressure ulcer of right buttock, stage 3 08/07/2021 No Yes I50.42 Chronic combined systolic (congestive) and diastolic (congestive) heart failure 08/07/2021 No Yes I10 Essential (primary) hypertension 08/07/2021 No Yes I25.10 Atherosclerotic heart disease of native coronary artery without angina pectoris 08/07/2021 No Yes G47.30 Sleep apnea, unspecified  08/07/2021 No Yes Z79.01 Long term (current) use of anticoagulants 08/07/2021 No Yes Z99.2 Dependence on renal dialysis 08/07/2021 No Yes N18.6 End stage renal disease 08/07/2021 No Yes Inactive Problems Resolved Problems Electronic Signature(s) Signed: 09/25/2021 8:57:01 AM By: Joaquim Lai  III, Tiago Humphrey PA-C Entered By: Worthy Keeler on 09/25/2021 08:57:01 -------------------------------------------------------------------------------- Progress Note Details Patient Name: Date of Service: [name] 09/25/2021 8:15 A M Medical Record Number: [id number] Patient Account Number: [id number] Date of Birth/Sex: Treating RN: 1940-05-25 (81 y.o. M) Primary Care Provider: Juline Patch Other Clinician: Referring Provider: Treating Provider/Extender: Pleas Patricia in Treatment: 7 Subjective Chief Complaint Information obtained from Patient Bilateral gluteal pressure ulcers History of Present Illness (HPI) 08-07-2021 upon evaluation today patient appears to be doing somewhat poorly in regard to wounds that he has over the bilateral gluteal region and this has been present for about a month. It started out as a rash this cleared and then since then he has had 2 open spots that seem to be more pressure related he was referred from dermatology to [place] here at the wound care center. He does have a culture previous that showed 1 bacteria and 1 fungal organism he was treated for both. He also is on Plavix due to his history of heart disease in general. He is also been placed more recently on dialysis and he has a recent fistula this might be part of the reason for the Plavix as well. Nonetheless in regard to the overall appearance of his wound things seem to be doing decently well although there is some necrotic tissue we need to clean away I think this is something we get him to heal he just needs to make sure that he is getting up and moving around not sitting for extended periods of  time. Patient does have a history of end-stage renal disease with dependence on renal dialysis, long-term use of anticoagulant therapy, sleep apnea, coronary artery disease, hypertension, and congestive heart failure. 08-14-2021 upon evaluation today patient appears to be doing well with regard to his wounds both are showing signs of improvement in just 1 week's time already I am very pleased with where we stand. With that being said I do believe that the patient would benefit from some sharp debridement today to clearway some of the necrotic debris he is in agreement with the plan and the good news is this does not hurt too severely and he is able to tolerate those debridements. 08-21-2021 upon evaluation today patient appears to be doing well currently in regard to his wounds. Fortunately I do not see any signs of active infection which is great news and overall I am extremely pleased with where we stand today. With that being said there is a lot of irritation around the edges of the wounds that is the biggest concern that he has right now his wife especially is very worried about this. Fortunately I do not see any evidence of infection locally or systemically at this time. 08-28-2021 upon evaluation today patient's wounds are appearing cleaner but not really smaller. At this point I really think that we need to do something to try to get this to stimulate some additional tissue growth to that end I am going to see about going ahead and set the patient up for a switch to silver collagen dressing instead of the Hydrofera Blue. Him and his wife are both in agreement with the plan. This also will not need be changed as often as he will have to leave this in place in order to allow it to heal much more effectively and quickly. 09-04-2021 upon evaluation today patient appears to be doing excellent in regard to his wounds. He has been  tolerating the dressing changes without complication and overall I am  extremely pleased with where we stand currently. There does not appear to be any signs of active infection locally or systemically at this point which is great news. No fevers, chills, nausea, vomiting, or diarrhea. 09-11-2021 upon evaluation today patient's wound on the left gluteal region actually appears to be completely closed based on what I am seeing. In regard to the right gluteal region this still has some signs of an open wound at this point unfortunately and I do believe that the patient would benefit from continuation of the collagen dressing at this time. 09-25-2021 upon evaluation today patient appears to be doing well currently in regard to his wound. This is actually measuring smaller which is great news is also not nearly as deep. Fortunately I think we are on the right track. There is going to be some need for sharp debridement. Objective Constitutional Well-nourished and well-hydrated in no acute distress. Vitals Time Taken: 7:56 AM, Height: 68 in, Weight: 200 lbs, BMI: 30.4, Temperature: 98.3 F, Pulse: 80 bpm, Respiratory Rate: 18 breaths/min, Blood Pressure: 124/66 mmHg. Respiratory normal breathing without difficulty. Psychiatric this patient is able to make decisions and demonstrates good insight into disease process. Alert and Oriented x 3. pleasant and cooperative. General Notes: Upon inspection patient's wound bed actually showed signs of good granulation and epithelization at this point. Fortunately I do not see any evidence of infection which is great news and overall I am extremely pleased. With that being said I do believe that the patient is making good progress here. Integumentary (Hair, Skin) Wound #1 status is Open. Original cause of wound was Pressure Injury. The date acquired was: 07/25/2020. The wound has been in treatment 7 weeks. The wound is located on the Right Gluteus. The wound measures 1.1cm length x 0.6cm width x 0.4cm depth; 0.518cm^2 area and 0.207cm^3  volume. There is Fat Layer (Subcutaneous Tissue) exposed. There is no tunneling or undermining noted. There is a medium amount of serosanguineous drainage noted. The wound margin is epibole. There is large (67-100%) red, pink granulation within the wound bed. There is a small (1-33%) amount of necrotic tissue within the wound bed including Adherent Slough. General Notes: maceration noted Wound #2 status is Healed - Epithelialized. Original cause of wound was Gradually Appeared. The date acquired was: 07/25/2020. The wound has been in treatment 7 weeks. The wound is located on the Left Gluteus. The wound measures 0cm length x 0cm width x 0cm depth; 0cm^2 area and 0cm^3 volume. Assessment Active Problems ICD-10 Pressure ulcer of left buttock, stage 3 Pressure ulcer of right buttock, stage 3 Chronic combined systolic (congestive) and diastolic (congestive) heart failure Essential (primary) hypertension Atherosclerotic heart disease of native coronary artery without angina pectoris Sleep apnea, unspecified Long term (current) use of anticoagulants Dependence on renal dialysis End stage renal disease Procedures Wound #1 Pre-procedure diagnosis of Wound #1 is a Pressure Ulcer located on the Right Gluteus . There was a Excisional Skin/Subcutaneous Tissue Debridement with a total area of 0.66 sq cm performed by Worthy Keeler, PA. With the following instrument(s): Curette to remove Non-Viable tissue/material. Material removed includes Subcutaneous Tissue, Slough, and Skin: Dermis after achieving pain control using Lidocaine 4% T opical Solution. No specimens were taken. A time out was conducted at 08:11, prior to the start of the procedure. A Minimum amount of bleeding was controlled with N/A. The procedure was tolerated well. Post Debridement Measurements: 1.1cm length x 0.6cm width  x 0.4cm depth; 0.207cm^3 volume. Post debridement Stage noted as Category/Stage III. Character of Wound/Ulcer Post  Debridement is stable. Post procedure Diagnosis Wound #1: Same as Pre-Procedure Plan Follow-up Appointments: Return Appointment in 1 week. Jeri Cos, PA and [place], Room 8 Wednesday 10/02/2021 8:15am Return appointment in 3 weeks. Jeri Cos, PA and [place], Room 8 Wednesday 10/16/2021 8:15am Other: - [employer]- to send wound care supplies. Cellular or Tissue Based Products: Wound #1 Right Gluteus: Cellular or Tissue Based Product Type: - wound care to run insurance authorization for [employer]- insurance denied. Bathing/ Shower/ Hygiene: May shower and wash wound with soap and water. Off-Loading: Other: - ensure to stand and/or walk every hour while sitting. use a piece of memory foam to sit home while sitting. WOUND #1: - Gluteus Wound Laterality: Right Cleanser: Soap and Water 3 x Per Week/30 Days Discharge Instructions: May shower and wash wound with dial antibacterial soap and water prior to dressing change. Cleanser: Wound Cleanser 3 x Per Week/30 Days Discharge Instructions: Cleanse the wound with wound cleanser prior to applying a clean dressing using gauze sponges, not tissue or cotton balls. Peri-Wound Care: Skin Prep 3 x Per Week/30 Days Discharge Instructions: Use skin prep as directed Prim Dressing: Promogran Prisma Matrix, 4.34 (sq in) (silver collagen) 3 x Per Week/30 Days ary Discharge Instructions: Moisten collagen with saline or hydrogel Secondary Dressing: Bordered Gauze, 2x2 in 3 x Per Week/30 Days Discharge Instructions: Apply over primary dressing as directed. Secondary Dressing: Woven Gauze Sponges 2x2 in 3 x Per Week/30 Days Discharge Instructions: Apply over Prisma to hold in place 1. I am going to suggest we going continue with the wound care measures as before and the patient is in agreement with that plan. This includes the use of the silver collagen which I think is still doing a good job we do some gauze behind to try to keep it in place. 2. We will  also get a use a bordered foam dressing to cover although I think that a next care waterproof bandage could maybe be a little less irritating on his skin they may look into that as well. We will see patient back for reevaluation in 1 week here in the clinic. If anything worsens or changes patient will contact our office for additional recommendations. Electronic Signature(s) Signed: 09/25/2021 1:55:37 PM By: Worthy Keeler PA-C Entered By: Worthy Keeler on 09/25/2021 13:55:37 -------------------------------------------------------------------------------- SuperBill Details Patient Name: Date of Service: [name], [place] 09/25/2021 Medical Record Number: [id number] Patient Account Number: [id number] Date of Birth/Sex: Treating RN: [birth year] (81 y.o. Marcheta Grammes Primary Care Provider: Juline Patch Other Clinician: Referring Provider: Treating Provider/Extender: Pleas Patricia in Treatment: 7 Diagnosis Coding ICD-10 Codes Code Description [phone] Pressure ulcer of left buttock, stage 3 L89.313 Pressure ulcer of right buttock, stage 3 I50.42 Chronic combined systolic (congestive) and diastolic (congestive) heart failure I10 Essential (primary) hypertension I25.10 Atherosclerotic heart disease of native coronary artery without angina pectoris G47.30 Sleep apnea, unspecified Z79.01 Long term (current) use of anticoagulants Z99.2 Dependence on renal dialysis N18.6 End stage renal disease Facility Procedures CPT4 Code: [id number] Description: 11042 - DEB SUBQ TISSUE 20 SQ CM/< ICD-10 Diagnosis Description L89.313 Pressure ulcer of right buttock, stage 3 Modifier: Quantity: 1 Physician Procedures : CPT4 Code Description Modifier [id number] - WC PHYS SUBQ TISS 20 SQ CM ICD-10 Diagnosis Description L89.313 Pressure ulcer of right buttock, stage 3 Quantity: 1 Electronic Signature(s) Signed:  09/25/2021 1:56:53 PM By: Worthy Keeler PA-C Signed: 09/25/2021  1:56:53 PM By: Worthy Keeler PA-C Entered By: Worthy Keeler on 09/25/2021 13:56:53

## 2021-10-02 ENCOUNTER — Encounter (HOSPITAL_BASED_OUTPATIENT_CLINIC_OR_DEPARTMENT_OTHER): Payer: Medicare HMO | Admitting: Physician Assistant

## 2021-10-02 DIAGNOSIS — L89323 Pressure ulcer of left buttock, stage 3: Secondary | ICD-10-CM | POA: Diagnosis not present

## 2021-10-02 NOTE — Progress Notes (Signed)
[name], [name] ([id number]) Visit Report for 10/02/2021 Chief Complaint Document Details Patient Name: Date of Service: Peter Oneill [facility] 10/02/2021 8:15 A M Medical Record Number: [id number] Patient Account Number: [id number] Date of Birth/Sex: Treating RN: [birth year] (81 y.o. [name] Primary Care Provider: Juline Patch Other Clinician: Referring Provider: Treating Provider/Extender: Pleas Patricia in Treatment: 8 Information Obtained from: Patient Chief Complaint Bilateral gluteal pressure ulcers Electronic Signature(s) Signed: 10/02/2021 8:35:55 AM By: Worthy Keeler PA-C Entered By: Worthy Keeler on 10/02/2021 08:35:55 -------------------------------------------------------------------------------- Problem List Details Patient Name: Date of Service: [name], [name] 10/02/2021 8:15 A M Medical Record Number: [id number] Patient Account Number: [id number] Date of Birth/Sex: Treating RN: 1940/07/06 (81 y.o. [name] Primary Care Provider: Juline Patch Other Clinician: Referring Provider: Treating Provider/Extender: Pleas Patricia in Treatment: 8 Active Problems ICD-10 Encounter Code Description Active Date MDM Diagnosis [phone] Pressure ulcer of left buttock, stage 3 08/07/2021 No Yes L89.313 Pressure ulcer of right buttock, stage 3 08/07/2021 No Yes I50.42 Chronic combined systolic (congestive) and diastolic (congestive) heart failure 08/07/2021 No Yes I10 Essential (primary) hypertension 08/07/2021 No Yes I25.10 Atherosclerotic heart disease of native coronary artery without angina pectoris 08/07/2021 No Yes G47.30 Sleep apnea, unspecified 08/07/2021 No Yes Z79.01 Long term (current) use of anticoagulants 08/07/2021 No Yes Z99.2 Dependence on renal dialysis 08/07/2021 No Yes N18.6 End stage renal disease 08/07/2021 No Yes Inactive Problems Resolved Problems Electronic Signature(s) Signed: 10/02/2021 8:35:27 AM By:  Worthy Keeler PA-C Entered By: Worthy Keeler on 10/02/2021 08:35:27

## 2021-10-02 NOTE — Progress Notes (Signed)
MILANO, ROSEVEAR ([id number]) Visit Report for 10/02/2021 Arrival Information Details Patient Name: Date of Service: Peter Oneill 10/02/2021 8:15 A M Medical Record Number: [id number] Patient Account Number: [id number] Date of Birth/Sex: Treating RN: 08/19/1940 (81 y.o. [name] Primary Care Stryker Veasey: Juline Patch Other Clinician: Referring Mikki Ziff: Treating Garlon Tuggle/Extender: Pleas Patricia in Treatment: 8 Visit Information History Since Last Visit Added or deleted any medications: No Patient Arrived: [name] Any new allergies or adverse reactions: No Arrival Time: 07:56 Had a fall or experienced change in No Accompanied By: wife activities of daily living that may affect Transfer Assistance: None risk of falls: Patient Identification Verified: Yes Signs or symptoms of abuse/neglect since last visito No Secondary Verification Process Completed: Yes Hospitalized since last visit: No Patient Requires Transmission-Based Precautions: No Implantable device outside of the clinic excluding No Patient Has Alerts: Yes cellular tissue based products placed in the center Patient Alerts: Patient on Blood Thinner since last visit: Has Dressing in Place as Prescribed: Yes Pain Present Now: No Electronic Signature(s) Signed: 10/02/2021 5:09:07 PM By: Deon Pilling RN, BSN Entered By: Deon Pilling on 10/02/2021 07:59:41 -------------------------------------------------------------------------------- Clinic Level of Care Assessment Details Patient Name: Date of Service: Peter Oneill, [place] 10/02/2021 8:15 A M Medical Record Number: [id number] Patient Account Number: [id number] Date of Birth/Sex: Treating RN: Jun 07, 1940 (81 y.o. [name], [name] Primary Care Jaydy Fitzhenry: Juline Patch Other Clinician: Referring Taylee Gunnells: Treating Ayaana Biondo/Extender: Pleas Patricia in Treatment: 8 Clinic Level of Care Assessment Items TOOL 4 Quantity  Score X- 1 0 Use when only an EandM is performed on FOLLOW-UP visit ASSESSMENTS - Nursing Assessment / Reassessment X- 1 10 Reassessment of Co-morbidities (includes updates in patient status) X- 1 5 Reassessment of Adherence to Treatment Plan ASSESSMENTS - Wound and Skin A ssessment / Reassessment X - Simple Wound Assessment / Reassessment - one wound 1 5 '[]'  - 0 Complex Wound Assessment / Reassessment - multiple wounds '[]'  - 0 Dermatologic / Skin Assessment (not related to wound area) ASSESSMENTS - Focused Assessment '[]'  - 0 Circumferential Edema Measurements - multi extremities '[]'  - 0 Nutritional Assessment / Counseling / Intervention '[]'  - 0 Lower Extremity Assessment (monofilament, tuning fork, pulses) '[]'  - 0 Peripheral Arterial Disease Assessment (using hand held doppler) ASSESSMENTS - Ostomy and/or Continence Assessment and Care '[]'  - 0 Incontinence Assessment and Management '[]'  - 0 Ostomy Care Assessment and Management (repouching, etc.) PROCESS - Coordination of Care X - Simple Patient / Family Education for ongoing care 1 15 '[]'  - 0 Complex (extensive) Patient / Family Education for ongoing care X- 1 10 Staff obtains Programmer, systems, Records, T Results / Process Orders est '[]'  - 0 Staff telephones HHA, Nursing Homes / Clarify orders / etc '[]'  - 0 Routine Transfer to another Facility (non-emergent condition) '[]'  - 0 Routine Hospital Admission (non-emergent condition) '[]'  - 0 New Admissions / Biomedical engineer / Ordering NPWT Apligraf, etc. , '[]'  - 0 Emergency Hospital Admission (emergent condition) X- 1 10 Simple Discharge Coordination '[]'  - 0 Complex (extensive) Discharge Coordination PROCESS - Special Needs '[]'  - 0 Pediatric / Minor Patient Management '[]'  - 0 Isolation Patient Management '[]'  - 0 Hearing / Language / Visual special needs '[]'  - 0 Assessment of Community assistance (transportation, D/C planning, etc.) '[]'  - 0 Additional assistance / Altered  mentation '[]'  - 0 Support Surface(s) Assessment (bed, cushion, seat, etc.) INTERVENTIONS - Wound Cleansing / Measurement X - Simple Wound Cleansing -  one wound 1 5 '[]'  - 0 Complex Wound Cleansing - multiple wounds X- 1 5 Wound Imaging (photographs - any number of wounds) '[]'  - 0 Wound Tracing (instead of photographs) X- 1 5 Simple Wound Measurement - one wound '[]'  - 0 Complex Wound Measurement - multiple wounds INTERVENTIONS - Wound Dressings X - Small Wound Dressing one or multiple wounds 1 10 '[]'  - 0 Medium Wound Dressing one or multiple wounds '[]'  - 0 Large Wound Dressing one or multiple wounds '[]'  - 0 Application of Medications - topical '[]'  - 0 Application of Medications - injection INTERVENTIONS - Miscellaneous '[]'  - 0 External ear exam '[]'  - 0 Specimen Collection (cultures, biopsies, blood, body fluids, etc.) '[]'  - 0 Specimen(s) / Culture(s) sent or taken to Lab for analysis '[]'  - 0 Patient Transfer (multiple staff / Civil Service fast streamer / Similar devices) '[]'  - 0 Simple Staple / Suture removal (25 or less) '[]'  - 0 Complex Staple / Suture removal (26 or more) '[]'  - 0 Hypo / Hyperglycemic Management (close monitor of Blood Glucose) '[]'  - 0 Ankle / Brachial Index (ABI) - do not check if billed separately X- 1 5 Vital Signs Has the patient been seen at the hospital within the last three years: Yes Total Score: 85 Level Of Care: New/Established - Level 3 Electronic Signature(s) Signed: 10/02/2021 5:09:07 PM By: Deon Pilling RN, BSN Entered By: Deon Pilling on 10/02/2021 08:38:34 -------------------------------------------------------------------------------- Encounter Discharge Information Details Patient Name: Date of Service: Peter Oneill, [place] 10/02/2021 8:15 A M Medical Record Number: [id number] Patient Account Number: [id number] Date of Birth/Sex: Treating RN: February 05, 1941 (81 y.o. [name] Primary Care Glorianne Proctor: Juline Patch Other Clinician: Referring Mattea Seger: Treating  Destine Zirkle/Extender: Pleas Patricia in Treatment: 8 Encounter Discharge Information Items Discharge Condition: Stable Ambulatory Status: Walker Discharge Destination: Home Transportation: Private Auto Accompanied By: wife Schedule Follow-up Appointment: Yes Clinical Summary of Care: Electronic Signature(s) Signed: 10/02/2021 5:09:07 PM By: Deon Pilling RN, BSN Entered By: Deon Pilling on 10/02/2021 08:38:59 -------------------------------------------------------------------------------- Lower Extremity Assessment Details Patient Name: Date of Service: Peter Oneill, [place] 10/02/2021 8:15 A M Medical Record Number: [id number] Patient Account Number: [id number] Date of Birth/Sex: Treating RN: Nov 18, 1940 (81 y.o. [name] Primary Care Alynah Schone: Juline Patch Other Clinician: Referring Sander Remedios: Treating Anthonymichael Munday/Extender: Pleas Patricia in Treatment: 8 Electronic Signature(s) Signed: 10/02/2021 5:09:07 PM By: Deon Pilling RN, BSN Entered By: Deon Pilling on 10/02/2021 08:00:27 -------------------------------------------------------------------------------- [place] Details Patient Name: Date of Service: Peter Oneill, [place] 10/02/2021 8:15 A M Medical Record Number: [id number] Patient Account Number: [id number] Date of Birth/Sex: Treating RN: Feb 05, 1941 (81 y.o. [name] Primary Care Arturo Freundlich: Juline Patch Other Clinician: Referring Colson Barco: Treating Asanti Craigo/Extender: Pleas Patricia in Treatment: 8 Active Inactive Pain, Acute or Chronic Nursing Diagnoses: Pain, acute or chronic: actual or potential Potential alteration in comfort, pain Goals: Patient will verbalize adequate pain control and receive pain control interventions during procedures as needed Date Initiated: 08/07/2021 Target Resolution Date: 10/25/2021 Goal Status: Active Patient/caregiver will verbalize comfort level  met Date Initiated: 08/07/2021 Target Resolution Date: 10/25/2021 Goal Status: Active Interventions: Encourage patient to take pain medications as prescribed Provide education on pain management Treatment Activities: Administer pain control measures as ordered : 08/07/2021 Notes: Pressure Nursing Diagnoses: Knowledge deficit related to management of pressures ulcers Potential for impaired tissue integrity related to pressure, friction, moisture, and shear Goals: Patient will remain free  from development of additional pressure ulcers Date Initiated: 08/07/2021 Date Inactivated: 10/02/2021 Target Resolution Date: 10/11/2021 Goal Status: Met Patient will remain free of pressure ulcers Date Initiated: 08/07/2021 Target Resolution Date: 10/17/2021 Goal Status: Active Patient/caregiver will verbalize understanding of pressure ulcer management Date Initiated: 08/07/2021 Target Resolution Date: 10/17/2021 Goal Status: Active Interventions: Assess: immobility, friction, shearing, incontinence upon admission and as needed Assess offloading mechanisms upon admission and as needed Provide education on pressure ulcers Treatment Activities: Patient referred for pressure reduction/relief devices : 08/07/2021 Pressure reduction/relief device ordered : 08/07/2021 Notes: Electronic Signature(s) Signed: 10/02/2021 5:09:07 PM By: Deon Pilling RN, BSN Entered By: Deon Pilling on 10/02/2021 08:09:33 -------------------------------------------------------------------------------- Pain Assessment Details Patient Name: Date of Service: Peter Oneill, [place] 10/02/2021 8:15 A M Medical Record Number: [id number] Patient Account Number: [id number] Date of Birth/Sex: Treating RN: 1941-02-16 (81 y.o. [name] Primary Care Delainy Mcelhiney: Juline Patch Other Clinician: Referring Elaura Calix: Treating Marjie Chea/Extender: Pleas Patricia in Treatment: 8 Active Problems Location of Pain Severity  and Description of Pain Patient Has Paino No Site Locations Rate the pain. Current Pain Level: 0 Pain Management and Medication Current Pain Management: Medication: No Cold Application: No Rest: No Massage: No Activity: No T.E.N.S.: No Heat Application: No Leg drop or elevation: No Is the Current Pain Management Adequate: Adequate How does your wound impact your activities of daily livingo Sleep: No Bathing: No Appetite: No Relationship With Others: No Bladder Continence: No Emotions: No Bowel Continence: No Work: No Toileting: No Drive: No Dressing: No Hobbies: No Engineer, maintenance) Signed: 10/02/2021 5:09:07 PM By: Deon Pilling RN, BSN Entered By: Deon Pilling on 10/02/2021 08:00:22 -------------------------------------------------------------------------------- Patient/Caregiver Education Details Patient Name: Date of Service: Peter Oneill, [place] 8/9/2023andnbsp8:15 A M Medical Record Number: [id number] Patient Account Number: [id number] Date of Birth/Gender: Treating RN: 1940-10-20 (81 y.o. [name] Primary Care Physician: Juline Patch Other Clinician: Referring Physician: Treating Physician/Extender: Pleas Patricia in Treatment: 8 Education Assessment Education Provided To: Patient Education Topics Provided Pain: Handouts: A Guide to Pain Control Methods: Explain/Verbal Responses: Reinforcements needed Electronic Signature(s) Signed: 10/02/2021 5:09:07 PM By: Deon Pilling RN, BSN Entered By: Deon Pilling on 10/02/2021 08:09:44 -------------------------------------------------------------------------------- Wound Assessment Details Patient Name: Date of Service: Peter Oneill, [place] 10/02/2021 8:15 A M Medical Record Number: [id number] Patient Account Number: [id number] Date of Birth/Sex: Treating RN: 06-23-1940 (81 y.o. [name], [name] Primary Care Rion Schnitzer: Juline Patch Other Clinician: Referring  Trinisha Paget: Treating Avyonna Wagoner/Extender: Pleas Patricia in Treatment: 8 Wound Status Wound Number: 1 Primary Pressure Ulcer Etiology: Wound Location: Right Gluteus Wound Open Wounding Event: Pressure Injury Status: Date Acquired: 07/25/2020 Comorbid Anemia, Sleep Apnea, Coronary Artery Disease, Hypertension, Weeks Of Treatment: 8 History: End Stage Renal Disease Clustered Wound: No Photos Wound Measurements Length: (cm) 0.9 Width: (cm) 0.3 Depth: (cm) 0.3 Area: (cm) 0.212 Volume: (cm) 0.064 % Reduction in Area: 74.1% % Reduction in Volume: 80.4% Epithelialization: Medium (34-66%) Tunneling: No Undermining: No Wound Description Classification: Category/Stage III Wound Margin: Epibole Exudate Amount: Medium Exudate Type: Serosanguineous Exudate Color: red, brown Foul Odor After Cleansing: No Slough/Fibrino No Wound Bed Granulation Amount: Large (67-100%) Exposed Structure Granulation Quality: Red, Pink Fascia Exposed: No Necrotic Amount: None Present (0%) Fat Layer (Subcutaneous Tissue) Exposed: Yes Tendon Exposed: No Muscle Exposed: No Joint Exposed: No Bone Exposed: No Treatment Notes Wound #1 (Gluteus) Wound Laterality: Right Cleanser Soap and Water Discharge Instruction: May shower and wash  wound with dial antibacterial soap and water prior to dressing change. Wound Cleanser Discharge Instruction: Cleanse the wound with wound cleanser prior to applying a clean dressing using gauze sponges, not tissue or cotton balls. Peri-Wound Care Skin Prep Discharge Instruction: Use skin prep as directed Topical Primary Dressing Promogran Prisma Matrix, 4.34 (sq in) (silver collagen) Discharge Instruction: Moisten collagen with saline or hydrogel Secondary Dressing Bordered Gauze, 2x2 in Discharge Instruction: Apply over primary dressing as directed. Woven Gauze Sponges 2x2 in Discharge Instruction: Apply over Prisma to hold in place Secured  With Compression Wrap Compression Stockings Add-Ons Electronic Signature(s) Signed: 10/02/2021 5:09:07 PM By: Deon Pilling RN, BSN Entered By: Deon Pilling on 10/02/2021 08:07:43 -------------------------------------------------------------------------------- Vit[place] Details Patient Name: Date of Service: Peter Oneill, [place] 10/02/2021 8:15 A M Medical Record Number: [id number] Patient Account Number: [id number] Date of Birth/Sex: Treating RN: [birth year] (81 y.o. [name], [name] Primary Care Taleen Prosser: Juline Patch Other Clinician: Referring Tarae Wooden: Treating Karlos Scadden/Extender: Pleas Patricia in Treatment: 8 Vital Signs Time Taken: 08:00 Temperature (F): 98.3 Height (in): 68 Pulse (bpm): 75 Weight (lbs): 200 Respiratory Rate (breaths/min): 20 Body Mass Index (BMI): 30.4 Blood Pressure (mmHg): 136/56 Reference Range: 80 - 120 mg / dl Electronic Signature(s) Signed: 10/02/2021 5:09:07 PM By: Deon Pilling RN, BSN Entered By: Deon Pilling on 10/02/2021 08:00:13

## 2021-10-16 ENCOUNTER — Encounter (HOSPITAL_BASED_OUTPATIENT_CLINIC_OR_DEPARTMENT_OTHER): Payer: Medicare HMO | Admitting: Physician Assistant

## 2021-10-16 DIAGNOSIS — L89323 Pressure ulcer of left buttock, stage 3: Secondary | ICD-10-CM | POA: Diagnosis not present

## 2021-10-16 NOTE — Progress Notes (Addendum)
[name], [name] ([id number]) Visit Report for 10/16/2021 Chief Complaint Document Details Patient Name: Date of Service: [facility] 10/16/2021 8:15 A M Medical Record Number: [id number] Patient Account Number: [id number] Date of Birth/Sex: Treating RN: [birth year] (81 y.o. [name] Primary Care Provider: Juline Patch Other Clinician: Referring Provider: Treating Provider/Extender: Pleas Patricia in Treatment: 10 Information Obtained from: Patient Chief Complaint Bilateral gluteal pressure ulcers Electronic Signature(s) Signed: 10/16/2021 8:01:28 AM By: Worthy Keeler PA-C Entered By: Worthy Keeler on 10/16/2021 08:01:28 -------------------------------------------------------------------------------- HPI Details Patient Name: Date of Service: Peter Oneill, [place] LTER 10/16/2021 8:15 A M Medical Record Number: [id number] Patient Account Number: [id number] Date of Birth/Sex: Treating RN: 16-May-1940 (81 y.o. [name] Primary Care Provider: Juline Patch Other Clinician: Referring Provider: Treating Provider/Extender: Pleas Patricia in Treatment: 10 History of Present Illness HPI Description: 08-07-2021 upon evaluation today patient appears to be doing somewhat poorly in regard to wounds that he has over the bilateral gluteal region and this has been present for about a month. It started out as a rash this cleared and then since then he has had 2 open spots that seem to be more pressure related he was referred from dermatology to [place] here at the wound care center. He does have a culture previous that showed 1 bacteria and 1 fungal organism he was treated for both. He also is on Plavix due to his history of heart disease in general. He is also been placed more recently on dialysis and he has a recent fistula this might be part of the reason for the Plavix as well. Nonetheless in regard to the overall appearance of his wound  things seem to be doing decently well although there is some necrotic tissue we need to clean away I think this is something we get him to heal he just needs to make sure that he is getting up and moving around not sitting for extended periods of time. Patient does have a history of end-stage renal disease with dependence on renal dialysis, long-term use of anticoagulant therapy, sleep apnea, coronary artery disease, hypertension, and congestive heart failure. 08-14-2021 upon evaluation today patient appears to be doing well with regard to his wounds both are showing signs of improvement in just 1 week's time already I am very pleased with where we stand. With that being said I do believe that the patient would benefit from some sharp debridement today to clearway some of the necrotic debris he is in agreement with the plan and the good news is this does not hurt too severely and he is able to tolerate those debridements. 08-21-2021 upon evaluation today patient appears to be doing well currently in regard to his wounds. Fortunately I do not see any signs of active infection which is great news and overall I am extremely pleased with where we stand today. With that being said there is a lot of irritation around the edges of the wounds that is the biggest concern that he has right now his wife especially is very worried about this. Fortunately I do not see any evidence of infection locally or systemically at this time. 08-28-2021 upon evaluation today patient's wounds are appearing cleaner but not really smaller. At this point I really think that we need to do something to try to get this to stimulate some additional tissue growth to that end I am going to see about going ahead and set  the patient up for a switch to silver collagen dressing instead of the Hydrofera Blue. Him and his wife are both in agreement with the plan. This also will not need be changed as often as he will have to leave this in place  in order to allow it to heal much more effectively and quickly. 09-04-2021 upon evaluation today patient appears to be doing excellent in regard to his wounds. He has been tolerating the dressing changes without complication and overall I am extremely pleased with where we stand currently. There does not appear to be any signs of active infection locally or systemically at this point which is great news. No fevers, chills, nausea, vomiting, or diarrhea. 09-11-2021 upon evaluation today patient's wound on the left gluteal region actually appears to be completely closed based on what I am seeing. In regard to the right gluteal region this still has some signs of an open wound at this point unfortunately and I do believe that the patient would benefit from continuation of the collagen dressing at this time. 09-25-2021 upon evaluation today patient appears to be doing well currently in regard to his wound. This is actually measuring smaller which is great news is also not nearly as deep. Fortunately I think we are on the right track. There is going to be some need for sharp debridement. 10-02-2021 upon evaluation today patient actually is showing signs of excellent improvement. Fortunately I do not see any evidence of active infection locally or systemically which is great news and overall I do believe that we are on the right track here. I think that the patient is making excellent progress which is great news. No fevers, chills, nausea, vomiting, or diarrhea. 10-16-2021 upon evaluation today patient appears to be doing well currently in regard to his wound. He has been tolerating the dressing changes without complication and overall I am extremely pleased with where we stand today. There is no evidence of active infection locally or systemically and the wound is completely healed. Electronic Signature(s) Signed: 10/16/2021 8:06:05 AM By: Worthy Keeler PA-C Entered By: Worthy Keeler on 10/16/2021  08:06:05 -------------------------------------------------------------------------------- Physical Exam Details Patient Name: Date of Service: [facility], [place] LTER 10/16/2021 8:15 A M Medical Record Number: [id number] Patient Account Number: [id number] Date of Birth/Sex: Treating RN: 04/30/1940 (81 y.o. [name] Primary Care Provider: Juline Patch Other Clinician: Referring Provider: Treating Provider/Extender: Pleas Patricia in Treatment: 54 Constitutional Well-nourished and well-hydrated in no acute distress. Respiratory normal breathing without difficulty. Psychiatric this patient is able to make decisions and demonstrates good insight into disease process. Alert and Oriented x 3. pleasant and cooperative. Notes Upon inspection patient's wound bed actually showed signs of good granulation and epithelization at this point. Fortunately I see no signs of infection locally or systemically which is great news and overall I do believe we are on the right track here. Electronic Signature(s) Signed: 10/16/2021 8:06:19 AM By: Worthy Keeler PA-C Entered By: Worthy Keeler on 10/16/2021 08:06:19 -------------------------------------------------------------------------------- Physician Orders Details Patient Name: Date of Service: Peter Oneill, [place] LTER 10/16/2021 8:15 A M Medical Record Number: [id number] Patient Account Number: [id number] Date of Birth/Sex: Treating RN: Feb 01, 1941 (81 y.o. [name] Primary Care Provider: Juline Patch Other Clinician: Referring Provider: Treating Provider/Extender: Pleas Patricia in Treatment: 10 Verbal / Phone Orders: No Diagnosis Coding ICD-10 Coding Code Description [phone] Pressure ulcer of left buttock, stage 3 L89.313 Pressure  ulcer of right buttock, stage 3 I50.42 Chronic combined systolic (congestive) and diastolic (congestive) heart failure I10 Essential (primary) hypertension I25.10  Atherosclerotic heart disease of native coronary artery without angina pectoris G47.30 Sleep apnea, unspecified Z79.01 Long term (current) use of anticoagulants Z99.2 Dependence on renal dialysis N18.6 End stage renal disease Discharge From [facility] Discharge from [place] - Call if any future wound care needs. moisturize your buttock area AandD to dry skin as needed. Electronic Signature(s) Signed: 10/16/2021 4:23:52 PM By: Worthy Keeler PA-C Signed: 10/16/2021 5:54:03 PM By: Deon Pilling RN, BSN Entered By: Deon Pilling on 10/16/2021 08:03:34 -------------------------------------------------------------------------------- Problem List Details Patient Name: Date of Service: Peter One[place], [place] LTER 10/16/2021 8:15 A M Medical Record Number: [id number] Patient Account Number: [id number] Date of Birth/Sex: Treating RN: 01-Feb-1941 (81 y.o. [name] Primary Care Provider: Juline Patch Other Clinician: Referring Provider: Treating Provider/Extender: Pleas Patricia in Treatment: 10 Active Problems ICD-10 Encounter Code Description Active Date MDM Diagnosis [phone] Pressure ulcer of left buttock, stage 3 08/07/2021 No Yes L89.313 Pressure ulcer of right buttock, stage 3 08/07/2021 No Yes I50.42 Chronic combined systolic (congestive) and diastolic (congestive) heart failure 08/07/2021 No Yes I10 Essential (primary) hypertension 08/07/2021 No Yes I25.10 Atherosclerotic heart disease of native coronary artery without angina pectoris 08/07/2021 No Yes G47.30 Sleep apnea, unspecified 08/07/2021 No Yes Z79.01 Long term (current) use of anticoagulants 08/07/2021 No Yes Z99.2 Dependence on renal dialysis 08/07/2021 No Yes N18.6 End stage renal disease 08/07/2021 No Yes Inactive Problems Resolved Problems Electronic Signature(s) Signed: 10/16/2021 8:01:21 AM By: Worthy Keeler PA-C Entered By: Worthy Keeler on 10/16/2021  08:01:21 -------------------------------------------------------------------------------- Progress Note Details Patient Name: Date of Service: Peter Oneill, [place] LTER 10/16/2021 8:15 A M Medical Record Number: [id number] Patient Account Number: [id number] Date of Birth/Sex: Treating RN: Dec 19, 1940 (81 y.o. [name] Primary Care Provider: Juline Patch Other Clinician: Referring Provider: Treating Provider/Extender: Pleas Patricia in Treatment: 10 Subjective Chief Complaint Information obtained from Patient Bilateral gluteal pressure ulcers History of Present Illness (HPI) 08-07-2021 upon evaluation today patient appears to be doing somewhat poorly in regard to wounds that he has over the bilateral gluteal region and this has been present for about a month. It started out as a rash this cleared and then since then he has had 2 open spots that seem to be more pressure related he was referred from dermatology to [place] here at the wound care center. He does have a culture previous that showed 1 bacteria and 1 fungal organism he was treated for both. He also is on Plavix due to his history of heart disease in general. He is also been placed more recently on dialysis and he has a recent fistula this might be part of the reason for the Plavix as well. Nonetheless in regard to the overall appearance of his wound things seem to be doing decently well although there is some necrotic tissue we need to clean away I think this is something we get him to heal he just needs to make sure that he is getting up and moving around not sitting for extended periods of time. Patient does have a history of end-stage renal disease with dependence on renal dialysis, long-term use of anticoagulant therapy, sleep apnea, coronary artery disease, hypertension, and congestive heart failure. 08-14-2021 upon evaluation today patient appears to be doing well with regard to his wounds both are showing  signs  of improvement in just 1 week's time already I am very pleased with where we stand. With that being said I do believe that the patient would benefit from some sharp debridement today to clearway some of the necrotic debris he is in agreement with the plan and the good news is this does not hurt too severely and he is able to tolerate those debridements. 08-21-2021 upon evaluation today patient appears to be doing well currently in regard to his wounds. Fortunately I do not see any signs of active infection which is great news and overall I am extremely pleased with where we stand today. With that being said there is a lot of irritation around the edges of the wounds that is the biggest concern that he has right now his wife especially is very worried about this. Fortunately I do not see any evidence of infection locally or systemically at this time. 08-28-2021 upon evaluation today patient's wounds are appearing cleaner but not really smaller. At this point I really think that we need to do something to try to get this to stimulate some additional tissue growth to that end I am going to see about going ahead and set the patient up for a switch to silver collagen dressing instead of the Hydrofera Blue. Him and his wife are both in agreement with the plan. This also will not need be changed as often as he will have to leave this in place in order to allow it to heal much more effectively and quickly. 09-04-2021 upon evaluation today patient appears to be doing excellent in regard to his wounds. He has been tolerating the dressing changes without complication and overall I am extremely pleased with where we stand currently. There does not appear to be any signs of active infection locally or systemically at this point which is great news. No fevers, chills, nausea, vomiting, or diarrhea. 09-11-2021 upon evaluation today patient's wound on the left gluteal region actually appears to be completely closed  based on what I am seeing. In regard to the right gluteal region this still has some signs of an open wound at this point unfortunately and I do believe that the patient would benefit from continuation of the collagen dressing at this time. 09-25-2021 upon evaluation today patient appears to be doing well currently in regard to his wound. This is actually measuring smaller which is great news is also not nearly as deep. Fortunately I think we are on the right track. There is going to be some need for sharp debridement. 10-02-2021 upon evaluation today patient actually is showing signs of excellent improvement. Fortunately I do not see any evidence of active infection locally or systemically which is great news and overall I do believe that we are on the right track here. I think that the patient is making excellent progress which is great news. No fevers, chills, nausea, vomiting, or diarrhea. 10-16-2021 upon evaluation today patient appears to be doing well currently in regard to his wound. He has been tolerating the dressing changes without complication and overall I am extremely pleased with where we stand today. There is no evidence of active infection locally or systemically and the wound is completely healed. Objective Constitutional Well-nourished and well-hydrated in no acute distress. Vitals Time Taken: 7:55 AM, Height: 68 in, Weight: 200 lbs, BMI: 30.4, Temperature: 98 F, Pulse: 88 bpm, Respiratory Rate: 20 breaths/min, Blood Pressure: 131/52 mmHg. Respiratory normal breathing without difficulty. Psychiatric this patient is able to make decisions and  demonstrates good insight into disease process. Alert and Oriented x 3. pleasant and cooperative. General Notes: Upon inspection patient's wound bed actually showed signs of good granulation and epithelization at this point. Fortunately I see no signs of infection locally or systemically which is great news and overall I do believe we are on  the right track here. Integumentary (Hair, Skin) Wound #1 status is Open. Original cause of wound was Pressure Injury. The date acquired was: 07/25/2020. The wound has been in treatment 10 weeks. The wound is located on the Right Gluteus. The wound measures 0cm length x 0cm width x 0cm depth; 0cm^2 area and 0cm^3 volume. There is no tunneling or undermining noted. There is a none present amount of drainage noted. The wound margin is epibole. There is no granulation within the wound bed. There is no necrotic tissue within the wound bed. Assessment Active Problems ICD-10 Pressure ulcer of left buttock, stage 3 Pressure ulcer of right buttock, stage 3 Chronic combined systolic (congestive) and diastolic (congestive) heart failure Essential (primary) hypertension Atherosclerotic heart disease of native coronary artery without angina pectoris Sleep apnea, unspecified Long term (current) use of anticoagulants Dependence on renal dialysis End stage renal disease Plan Discharge From [facility] Services: Discharge from [place] - Call if any future wound care needs. moisturize your buttock area AandD to dry skin as needed. 1. Based on the fact that the patient is completely healed at this point I would recommend discharge. 2. I am going to suggest that he should use AandD ointment in order to keep things moist and protected and movement in the right direction I think this is good to be the best way to go for him at this point. The patient voiced understanding. We will see patient back for reevaluation in 1 week here in the clinic. If anything worsens or changes patient will contact our office for additional recommendations. Electronic Signature(s) Signed: 10/16/2021 8:06:59 AM By: Worthy Keeler PA-C Entered By: Worthy Keeler on 10/16/2021 08:06:58 -------------------------------------------------------------------------------- SuperBill Details Patient Name: Date of Service: Peter Oneill, [place] 10/16/2021 Medical Record Number: [id number] Patient Account Number: [id number] Date of Birth/Sex: Treating RN: [birth year] (81 y.o. 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 failure I10 Essential (primary) hypertension I25.10 Atherosclerotic heart disease of native coronary artery without angina pectoris G47.30 Sleep apnea, unspecified Z79.01 Long term (current) use of anticoagulants Z99.2 Dependence on renal dialysis N18.6 End stage renal disease Facility Procedures CPT4 Code: [id number] Description: 99213 - WOUND CARE VISIT-LEV 3 EST PT Modifier: Quantity: 1 Physician Procedures : CPT4 Code Description Modifier [id number] - WC PHYS LEVEL 3 - EST PT ICD-10 Diagnosis Description L89.323 Pressure ulcer of left buttock, stage 3 L89.313 Pressure ulcer of right buttock, stage 3 I50.42 Chronic combined systolic (congestive) and  diastolic (congestive) heart failure I10 Essential (primary) hypertension Quantity: 1 Electronic Signature(s) Signed: 10/16/2021 8:07:22 AM By: Worthy Keeler PA-C Entered By: Worthy Keeler on 10/16/2021 08:07:22

## 2021-10-16 NOTE — Progress Notes (Signed)
BEAUREGARD, JARRELLS ([id number]) Visit Report for 10/16/2021 Arrival Information Details Patient Name: Date of Service: Peter Oneill LTER 10/16/2021 8:15 A M Medical Record Number: [id number] Patient Account Number: [id number] Date of Birth/Sex: Treating RN: 08-08-1940 (81 y.o. Peter Oneill Primary Care Hayat Warbington: Juline Patch Other Clinician: Referring Zhania Shaheen: Treating Avid Guillette/Extender: Pleas Patricia in Treatment: 10 Visit Information History Since Last Visit Added or deleted any medications: No Patient Arrived: Peter Oneill Any new allergies or adverse reactions: No Arrival Time: 07:55 Had a fall or experienced change in No Accompanied By: wife activities of daily living that may affect Transfer Assistance: None risk of falls: Patient Identification Verified: Yes Signs or symptoms of abuse/neglect since last visito No Secondary Verification Process Completed: Yes Hospitalized since last visit: No Patient Requires Transmission-Based Precautions: No Implantable device outside of the clinic excluding No Patient Has Alerts: Yes cellular tissue based products placed in the center Patient Alerts: Patient on Blood Thinner since last visit: Has Dressing in Place as Prescribed: Yes Pain Present Now: No Electronic Signature(s) Signed: 10/16/2021 5:54:03 PM By: Deon Pilling RN, BSN Entered By: Deon Pilling on 10/16/2021 07:56:19 -------------------------------------------------------------------------------- Clinic Level of Care Assessment Details Patient Name: Date of Service: Peter Oneill, [place] LTER 10/16/2021 8:15 A M Medical Record Number: [id number] Patient Account Number: [id number] Date of Birth/Sex: Treating RN: July 28, 1940 (81 y.o. Peter Oneill, Peter Oneill Primary Care Rayna Brenner: Juline Patch Other Clinician: Referring Raliegh Scobie: Treating Tonya Wantz/Extender: Pleas Patricia in Treatment: 10 Clinic Level of Care Assessment Items TOOL 4 Quantity  Score X- 1 0 Use when only an EandM is performed on FOLLOW-UP visit ASSESSMENTS - Nursing Assessment / Reassessment X- 1 10 Reassessment of Co-morbidities (includes updates in patient status) X- 1 5 Reassessment of Adherence to Treatment Plan ASSESSMENTS - Wound and Skin A ssessment / Reassessment X - Simple Wound Assessment / Reassessment - one wound 1 5 []  - 0 Complex Wound Assessment / Reassessment - multiple wounds X- 1 10 Dermatologic / Skin Assessment (not related to wound area) ASSESSMENTS - Focused Assessment []  - 0 Circumferential Edema Measurements - multi extremities []  - 0 Nutritional Assessment / Counseling / Intervention []  - 0 Lower Extremity Assessment (monofilament, tuning fork, pulses) []  - 0 Peripheral Arterial Disease Assessment (using hand held doppler) ASSESSMENTS - Ostomy and/or Continence Assessment and Care []  - 0 Incontinence Assessment and Management []  - 0 Ostomy Care Assessment and Management (repouching, etc.) PROCESS - Coordination of Care X - Simple Patient / Family Education for ongoing care 1 15 []  - 0 Complex (extensive) Patient / Family Education for ongoing care X- 1 10 Staff obtains Consents, Records, T Results / Process Orders est []  - 0 Staff telephones HHA, Nursing Homes / Clarify orders / etc []  - 0 Routine Transfer to another Facility (non-emergent condition) []  - 0 Routine Hospital Admission (non-emergent condition) []  - 0 New Admissions / Biomedical engineer / Ordering NPWT Apligraf, etc. , []  - 0 Emergency Hospital Admission (emergent condition) X- 1 10 Simple Discharge Coordination []  - 0 Complex (extensive) Discharge Coordination PROCESS - Special Needs []  - 0 Pediatric / Minor Patient Management []  - 0 Isolation Patient Management []  - 0 Hearing / Language / Visual special needs []  - 0 Assessment of Community assistance (transportation, D/C planning, etc.) []  - 0 Additional assistance / Altered  mentation []  - 0 Support Surface(s) Assessment (bed, cushion, seat, etc.) INTERVENTIONS - Wound Cleansing / Measurement X - Simple Wound Cleansing -  one wound 1 5 []  - 0 Complex Wound Cleansing - multiple wounds X- 1 5 Wound Imaging (photographs - any number of wounds) []  - 0 Wound Tracing (instead of photographs) X- 1 5 Simple Wound Measurement - one wound []  - 0 Complex Wound Measurement - multiple wounds INTERVENTIONS - Wound Dressings []  - 0 Small Wound Dressing one or multiple wounds []  - 0 Medium Wound Dressing one or multiple wounds []  - 0 Large Wound Dressing one or multiple wounds []  - 0 Application of Medications - topical []  - 0 Application of Medications - injection INTERVENTIONS - Miscellaneous []  - 0 External ear exam []  - 0 Specimen Collection (cultures, biopsies, blood, body fluids, etc.) []  - 0 Specimen(s) / Culture(s) sent or taken to Lab for analysis []  - 0 Patient Transfer (multiple staff / Civil Service fast streamer / Similar devices) []  - 0 Simple Staple / Suture removal (25 or less) []  - 0 Complex Staple / Suture removal (26 or more) []  - 0 Hypo / Hyperglycemic Management (close monitor of Blood Glucose) []  - 0 Ankle / Brachial Index (ABI) - do not check if billed separately X- 1 5 Vital Signs Has the patient been seen at the hospital within the last three years: Yes Total Score: 85 Level Of Care: New/Established - Level 3 Electronic Signature(s) Signed: 10/16/2021 5:54:03 PM By: Deon Pilling RN, BSN Entered By: Deon Pilling on 10/16/2021 08:04:05 -------------------------------------------------------------------------------- Encounter Discharge Information Details Patient Name: Date of Service: Peter Oneill, [place] LTER 10/16/2021 8:15 A M Medical Record Number: [id number] Patient Account Number: [id number] Date of Birth/Sex: Treating RN: May 08, 1940 (81 y.o. Peter Oneill Primary Care Darby Fleeman: Juline Patch Other Clinician: Referring Vivien Barretto: Treating  Melondy Blanchard/Extender: Pleas Patricia in Treatment: 10 Encounter Discharge Information Items Discharge Condition: Stable Ambulatory Status: Walker Discharge Destination: Home Transportation: Private Auto Accompanied By: wife Schedule Follow-up Appointment: No Clinical Summary of Care: Electronic Signature(s) Signed: 10/16/2021 5:54:03 PM By: Deon Pilling RN, BSN Entered By: Deon Pilling on 10/16/2021 08:04:27 -------------------------------------------------------------------------------- Lower Extremity Assessment Details Patient Name: Date of Service: Peter Oneill, [place] 10/16/2021 8:15 A M Medical Record Number: [id number] Patient Account Number: [id number] Date of Birth/Sex: Treating RN: 15-Aug-1940 (81 y.o. Peter Oneill Primary Care Yerlin Gasparyan: Juline Patch Other Clinician: Referring Gracelyn Coventry: Treating Quanta Robertshaw/Extender: Pleas Patricia in Treatment: 10 Electronic Signature(s) Signed: 10/16/2021 5:54:03 PM By: Deon Pilling RN, BSN Entered By: Deon Pilling on 10/16/2021 07:56:57 -------------------------------------------------------------------------------- [place] Details Patient Name: Date of Service: Peter Oneill, [place] LTER 10/16/2021 8:15 A M Medical Record Number: [id number] Patient Account Number: [id number] Date of Birth/Sex: Treating RN: 06-05-1940 (81 y.o. Peter Oneill Primary Care Taunya Goral: Juline Patch Other Clinician: Referring Almedia Cordell: Treating Tymothy Cass/Extender: Pleas Patricia in Treatment: 10 Active Inactive Electronic Signature(s) Signed: 10/16/2021 5:54:03 PM By: Deon Pilling RN, BSN Entered By: Deon Pilling on 10/16/2021 08:02:30 -------------------------------------------------------------------------------- Pain Assessment Details Patient Name: Date of Service: Peter Oneill, [place] LTER 10/16/2021 8:15 A M Medical Record Number: [id number] Patient Account Number:  [id number] Date of Birth/Sex: Treating RN: 1940/07/20 (81 y.o. Peter Oneill Primary Care Kailyn Vanderslice: Juline Patch Other Clinician: Referring Johnay Mano: Treating Lataria Courser/Extender: Pleas Patricia in Treatment: 10 Active Problems Location of Pain Severity and Description of Pain Patient Has Paino No Site Locations Rate the pain. Current Pain Level: 0 Pain Management and Medication Current Pain Management: Medication: No Cold Application: No Rest: No Massage:  No Activity: No T.E.N.S.: No Heat Application: No Leg drop or elevation: No Is the Current Pain Management Adequate: Adequate How does your wound impact your activities of daily livingo Sleep: No Bathing: No Appetite: No Relationship With Others: No Bladder Continence: No Emotions: No Bowel Continence: No Work: No Toileting: No Drive: No Dressing: No Hobbies: No Engineer, maintenance) Signed: 10/16/2021 5:54:03 PM By: Deon Pilling RN, BSN Entered By: Deon Pilling on 10/16/2021 07:56:52 -------------------------------------------------------------------------------- Patient/Caregiver Education Details Patient Name: Date of Service: Peter Oneill, [place] 8/23/2023andnbsp8:15 A M Medical Record Number: [id number] Patient Account Number: [id number] Date of Birth/Gender: Treating RN: [birth year] (81 y.o. Peter Oneill Primary Care Physician: Juline Patch Other Clinician: Referring Physician: Treating Physician/Extender: Pleas Patricia in Treatment: 10 Education Assessment Education Provided To: Patient Education Topics Provided Wound/Skin Impairment: Handouts: Skin Care Do's and Dont's Methods: Explain/Verbal Responses: Reinforcements needed Electronic Signature(s) Signed: 10/16/2021 5:54:03 PM By: Deon Pilling RN, BSN Entered By: Deon Pilling on 10/16/2021 08:02:49 -------------------------------------------------------------------------------- Wound  Assessment Details Patient Name: Date of Service: Peter Oneill, Nolon Bussing LTER 10/16/2021 8:15 A M Medical Record Number: [id number] Patient Account Number: [id number] Date of Birth/Sex: Treating RN: February 05, 1941 (81 y.o. Peter Oneill, Peter Oneill Primary Care Latashia Koch: Juline Patch Other Clinician: Referring Miyoshi Ligas: Treating Navraj Dreibelbis/Extender: Pleas Patricia in Treatment: 10 Wound Status Wound Number: 1 Primary Pressure Ulcer Etiology: Wound Location: Right Gluteus Wound Open Wounding Event: Pressure Injury Status: Date Acquired: 07/25/2020 Comorbid Anemia, Sleep Apnea, Coronary Artery Disease, Hypertension, Weeks Of Treatment: 10 History: End Stage Renal Disease Clustered Wound: No Photos Wound Measurements Length: (cm) Width: (cm) Depth: (cm) Area: (cm) Volume: (cm) 0 % Reduction in Area: 100% 0 % Reduction in Volume: 100% 0 Epithelialization: Large (67-100%) 0 Tunneling: No 0 Undermining: No Wound Description Classification: Category/Stage III Wound Margin: Epibole Exudate Amount: None Present Foul Odor After Cleansing: No Slough/Fibrino No Wound Bed Granulation Amount: None Present (0%) Exposed Structure Necrotic Amount: None Present (0%) Fascia Exposed: No Fat Layer (Subcutaneous Tissue) Exposed: No Tendon Exposed: No Muscle Exposed: No Joint Exposed: No Bone Exposed: No Electronic Signature(s) Signed: 10/16/2021 5:54:03 PM By: Deon Pilling RN, BSN Entered By: Deon Pilling on 10/16/2021 07:59:59 -------------------------------------------------------------------------------- Vitals Details Patient Name: Date of Service: Peter Oneill, [place] LTER 10/16/2021 8:15 A M Medical Record Number: [id number] Patient Account Number: [id number] Date of Birth/Sex: Treating RN: Nov 14, 1940 (81 y.o. Peter Oneill, Peter Oneill Primary Care Bakari Nikolai: Juline Patch Other Clinician: Referring Ahmani Daoud: Treating Dayna Alia/Extender: Pleas Patricia in  Treatment: 10 Vital Signs Time Taken: 07:55 Temperature (F): 98 Height (in): 68 Pulse (bpm): 88 Weight (lbs): 200 Respiratory Rate (breaths/min): 20 Body Mass Index (BMI): 30.4 Blood Pressure (mmHg): 131/52 Reference Range: 80 - 120 mg / dl Electronic Signature(s) Signed: 10/16/2021 5:54:03 PM By: Deon Pilling RN, BSN Entered By: Deon Pilling on 10/16/2021 07:56:31

## 2021-10-21 ENCOUNTER — Encounter: Payer: Self-pay | Admitting: Pulmonary Disease

## 2021-10-21 ENCOUNTER — Ambulatory Visit (INDEPENDENT_AMBULATORY_CARE_PROVIDER_SITE_OTHER): Payer: Worker's Compensation | Admitting: Pulmonary Disease

## 2021-10-21 VITALS — BP 144/62 | HR 82 | Temp 97.6°F | Ht 69.0 in | Wt 202.2 lb

## 2021-10-21 DIAGNOSIS — J920 Pleural plaque with presence of asbestos: Secondary | ICD-10-CM | POA: Diagnosis not present

## 2021-10-21 DIAGNOSIS — J61 Pneumoconiosis due to asbestos and other mineral fibers: Secondary | ICD-10-CM | POA: Diagnosis not present

## 2021-10-21 NOTE — Progress Notes (Signed)
Peter Oneill    [id number]    1940-04-24  Primary Care Physician:Miller, Lattie Haw, MD  Referring Physician: [name], MD [place],  [place] [id number]  Chief complaint: Follow-up for asbestosis, COPD, emphysema  HPI: Peter Oneill is a 81 year old with past medical history of coronary artery disease status post CABG, hypertension, OSA, asbestosis  He was in the [employer] for 5 years. 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 He reports significant exposure to asbestos for about 28 years in his line of work. He had a chest x-ray that showed interstitial opacities and after a workup he was given a diagnosis of asbestosis in early 1990s and is on Workmen's Compensation. History noted for an episode of hemoptysis in 2001 which was evaluated by CT chest which did not show any evidence of malignancy. He has history of CPAP obstructive sleep apnea and is on CPAP.  He moved from [place] to [place], [place] to be with his daughter and is here to establish care. He was previously followed by Peter Oneill, Pulmonologist in [place]. Chief complaint today is chronic dyspnea on exertion, associated with cough, white mucus. He denies hemoptysis, wheezing, fevers, chills. He is not on any inhalers. He had been tried on history. Quit 40 years ago.  Interim history: Breathing is stable.  He is getting annual CTs at [facility] through his insurance company for monitoring of asbestosis  Had to go on dialysis in 2023 for end-stage renal disease  Outpatient Encounter Medications as of 10/21/2021  Medication Sig   acetaminophen (TYLENOL) 500 MG tablet Take 1,000 mg by mouth every 6 (six) hours as needed for moderate pain.   allopurinol (ZYLOPRIM) 100 MG tablet Take 100 mg by mouth daily.   amLODipine (NORVASC) 5 MG tablet Take 1 tablet (5 mg total) by mouth daily.   ANORO ELLIPTA 62.5-25 MCG/ACT AEPB Inhale 1 puff into the lungs  daily. USE 1 INHALATION ONCE DAILY   clopidogrel (PLAVIX) 75 MG tablet Take 1 tablet (75 mg total) by mouth daily.   ezetimibe (ZETIA) 10 MG tablet Take 10 mg by mouth in the morning.   hydrocortisone (ANUSOL-HC) 25 MG suppository Place 25 mg rectally daily as needed for hemorrhoids or anal itching.   latanoprost (XALATAN) 0.005 % ophthalmic solution Place 1 drop into both eyes at bedtime.   Multiple Vitamin (MULTIVITAMIN) tablet Take 1 tablet by mouth daily.   nitroGLYCERIN (NITROSTAT) 0.4 MG SL tablet Place 1 tablet (0.4 mg total) under the tongue every 5 (five) minutes as needed for chest pain.   Omega-3 Fatty Acids (FISH OIL) 1000 MG CAPS Take 1,000 mg by mouth daily.   rosuvastatin (CRESTOR) 10 MG tablet Take 10 mg by mouth daily.   sevelamer carbonate (RENVELA) 800 MG tablet Take 2 tablets (1,600 mg total) by mouth 3 (three) times daily with meals.   silodosin (RAPAFLO) 4 MG CAPS capsule Take 4 mg by mouth daily with breakfast.   [DISCONTINUED] ferrous sulfate 325 (65 FE) MG EC tablet Take 1 tablet (325 mg total) by mouth 2 (two) times daily.   No facility-administered encounter medications on file as of 10/21/2021.   Physical Exam: Blood pressure (!) 144/62, pulse 82, temperature 97.6 F (36.4 C), temperature source Oral, height 5\' 9"  (1.753 m), weight 202 lb 3.2 oz (91.7 kg), SpO2 99 %. Gen:      No acute distress HEENT:  EOMI, sclera anicteric  Neck:     No masses; no thyromegaly Lungs:    Clear to auscultation bilaterally; normal respiratory effort  CV:         Regular rate and rhythm; no murmurs Abd:      + bowel sounds; soft, non-tender; no palpable masses, no distension Ext:    No edema; adequate peripheral perfusion Skin:      Warm and dry; no rash Neuro: alert and oriented x 3 Psych: normal mood and affect  Data Reviewed: Data from [facility] PFTs 07/15/11 FVC 2.5 [62%], FEV1 1.91 (62%], FF 76, TLC 73%, diffusion 80%. Mild restrictive lung disease with reduced total lung  capacity. Diffusion is low normal. Compared to 2011 there is minimal change in spirometry and a more significant drop in diffusion  CT chest 03/08/03-extensive bilateral calcified pleural plaques. No evidence of interstitial lung disease CT chest 05/24/16-slight interval progression of patient's known bilateral calcified pleural plaques. There is mild degree of bibasilar interstitial fibrosis. I do not have the images to review.   Date from [facility] PFTs 06/23/2017 FVC 2.47 (3%], FEV1 1.78 [64%], F/F 72, TLC 68%, RV/TLC 131%, DLCO 60%, DLCO/VA 94% Moderate obstruction, severe restriction with air trapping.  Moderate diffusion impairment  High-resolution CT chest 06/11/2017- extensive calcified pleural plaques, subpleural reticulation, mild emphysema.  Aortic atherosclerosis I reviewed the images personally  High-resolution CT [facility] 10/19/2018- Bilateral bulky, partially calcified pleural plaques are consistent with the history of prior asbestos exposure. There is mild bibasilar pleural parenchymal banding, though no honeycombing or reticulation to suggest interstitial lung disease. A small loculated right pleural effusion is present.   High-resolution CT [facility] 09/12/2019-no interstitial lung disease, stable stable bulky bilateral partially calcified pleural plaques and a small chronic right pleural effusion.   High-resolution CT [facility] 12/21/2020 1. Stable appearance of widespread pleural plaques with mild bandlike areas of scarring greatest in the bases. No typical widespread fibrotic changes.  2. No acute findings.   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 His CT scan shows extensive calcified pleural plaques. He'll need to be monitored annually with lung imaging and pulmonary function tests.  Unfortunately he gets his imaging at [facility] as this is where his workman comp sends  him.   He is due to get another CT done later this year.  COPD, emphysema Continue An[facility] PFTs reviewed with improvement in obstruction and restriction or diffusion impairment This is likely due to his recent exercise regimen.  Congratulated him and encouraged him to continue  OSA Intolerant of CPAP.  Does not want to retry  Plan/Recommendations: - Continue Anoro - Continue annual monitoring.    Marshell Garfinkel MD [facility] 10/21/2021, 9:14 AM  CC: Peter Lass, MD

## 2021-10-21 NOTE — Patient Instructions (Signed)
I am glad you are doing well with your breathing and the CT stable Continue annual CTs as you are doing at [facility] Follow-up in 1 year

## 2021-11-21 NOTE — Progress Notes (Signed)
Cardiology Office Note   Date:  11/22/2021   ID:  Peter Oneill, DOB Nov 05, 1940, MRN [id number]  PCP:  [name], MD    No chief complaint on file.    Wt Readings from Last 3 Encounters:  11/22/21 198 lb 3.2 oz (89.9 kg)  10/21/21 202 lb 3.2 oz (91.7 kg)  07/17/21 198 lb 3.1 oz (89.9 kg)       History of Present Illness: Peter Oneill is a 81 y.o. male    Who has had extensive CAD. 3.5 x 13 Cypher, and 3.5 x 18 Cypher in 2003.  He then had CABG in 2003 after having burning in his chest, which occurred with walking/hunting.  A few years later, he required several stents, (2004; 3.5 x 18 Cypher).  Details are not available at this time.  He required repeat CABG in 12/16, with RIMA to LAD and SVG to diagonal.  Last cath was 10/17 in which a stent was placed.  THat cath was done at [place], [place].     Additional anatomic information from prior records from 2015 cath: LIMA to LAD is atretic. LAD filled by SVG to diagonal prior to second bypass surgery in 2016. SVG to circumflex is occluded. This information comes from a cardiac cath report from 2015. At that time in 2015, the RCA was stented with a 3.5 x 12, and 3.0 x 8 Xience drug-eluting stent.  2015 , he had 3.5 x 15 Xience to the SVG to diagonal.   He also has a 3.5 x 22 resolute stent in the circumflex.  3.0 x 12 Onyx in the Circ, 2.5 x 12 in the OM (2017)   He had CEA in 1996    On 12/25/2018, he had persistent chest burning that lasted a few minutes.  He did not use NTG or seek attention in the ER.  He has not had issues with walking or mowing the lawn.  It occurred while he would lie on his right side, and resolved when he changed positions.   He fell in the shower around Thanksgiving 2020.  He had a big bruise.    We had tried Protonix for the chest burning that he had on the right side.  It worked well and there was no residual burning.  He still has some residual back pain from the fall, but it improved.    He was  admitted with ARF in 2/22.  He had severe volume overload, 30 lbs weight gain in a few weeks.  Cr went to 4.3 from baseline of 2.6.   Came down to 3.8 at discharge.     In 2022: "Renal function has been stable of late with Dr. Justin Mend, Cr 2.55 range.   Works in the garden.  Cuts grass.  Has a stationary bike, but use limited by back pain."    Past Medical History:  Diagnosis Date   Asbestosis ([place])    Contraindication to percutaneous coronary intervention (PCI)    Coronary artery disease    Dyslipidemia    Dyspnea    History of anemia    Hypercholesterolemia    Hypertension    Ileus ([facility])    Pleural effusion on left    Pneumonia    Renal failure    Sleep apnea     Past Surgical History:  Procedure Laterality Date   A/V FISTULAGRAM Left 07/17/2021   Procedure: A/V Fistulagram;  Surgeon: Broadus John, MD;  Location: [facility];  Service:  Cardiovascular;  Laterality: Left;   AV FISTULA PLACEMENT Left 04/23/2021   Procedure: LEFT ARM  FISTULA CREATION;  Surgeon: Broadus John, MD;  Location: [facility];  Service: Vascular;  Laterality: Left;  PERIPHERAL NERVE BLOCK   BACK SURGERY     CARDIAC CATHETERIZATION     CAROTID ENDARTERECTOMY     CARPAL TUNNEL RELEASE     CORONARY ARTERY BYPASS GRAFT     HERNIA REPAIR     PERCUTANEOUS CORONARY STENT INTERVENTION (PCI-S)     multiple   STERNOTOMY       Current Outpatient Medications  Medication Sig Dispense Refill   acetaminophen (TYLENOL) 500 MG tablet Take 1,000 mg by mouth every 6 (six) hours as needed for moderate pain.     allopurinol (ZYLOPRIM) 100 MG tablet Take 100 mg by mouth 3 (three) times a week.     amLODipine (NORVASC) 5 MG tablet Take 1 tablet (5 mg total) by mouth daily. 30 tablet 3   ANORO ELLIPTA 62.5-25 MCG/ACT AEPB Inhale 1 puff into the lungs daily. USE 1 INHALATION ONCE DAILY 60 each 5   clopidogrel (PLAVIX) 75 MG tablet Take 1 tablet (75 mg total) by mouth daily. 90 tablet 3   ezetimibe (ZETIA) 10 MG  tablet Take 10 mg by mouth in the morning.     hydrocortisone (ANUSOL-HC) 25 MG suppository Place 25 mg rectally daily as needed for hemorrhoids or anal itching.     latanoprost (XALATAN) 0.005 % ophthalmic solution Place 1 drop into both eyes at bedtime.     Multiple Vitamin (MULTIVITAMIN) tablet Take 1 tablet by mouth daily.     nitroGLYCERIN (NITROSTAT) 0.4 MG SL tablet Place 1 tablet (0.4 mg total) under the tongue every 5 (five) minutes as needed for chest pain. 25 tablet 3   Omega-3 Fatty Acids (FISH OIL) 1000 MG CAPS Take 1,000 mg by mouth daily.     rosuvastatin (CRESTOR) 10 MG tablet Take 10 mg by mouth daily.     sevelamer carbonate (RENVELA) 800 MG tablet Take 2 tablets (1,600 mg total) by mouth 3 (three) times daily with meals. 180 tablet 3   silodosin (RAPAFLO) 4 MG CAPS capsule Take 4 mg by mouth daily with breakfast.     No current facility-administered medications for this visit.    Allergies:   Atorvastatin    Social History:  The patient  reports that he quit smoking about 44 years ago. His smoking use included cigarettes. He has a 4.75 pack-year smoking history. He has never used smokeless tobacco. He reports current alcohol use. He reports that he does not use drugs.   Family History:  The patient's family history includes Heart disease in his father.    ROS:  Please see the history of present illness.   Otherwise, review of systems are positive for some fatigue, doing better with dialysis.   All other systems are reviewed and negative.    PHYSICAL EXAM: VS:  BP (!) 142/56   Pulse 69   Ht 5\' 9"  (1.753 m)   Wt 198 lb 3.2 oz (89.9 kg)   SpO2 95%   BMI 29.27 kg/m  , BMI Body mass index is 29.27 kg/m. GEN: Well nourished, well developed, in no acute distress HEENT: normal Neck: no JVD, carotid bruits, or masses Cardiac: premature beats, RRR; no murmurs, rubs, or gallops,no edema  Respiratory:  clear to auscultation bilaterally, normal work of breathing GI: soft,  nontender, nondistended, + BS MS: no deformity or atrophy  Skin: warm and dry, no rash Neuro:  Strength and sensation are intact Psych: euthymic mood, full affect    Recent Labs: 05/13/2021: ALT 14; B Natriuretic Peptide 1,173.0; TSH 7.500 05/18/2021: Magnesium 2.4 07/12/2021: Platelets 212 07/17/2021: BUN 26; Creatinine, Ser 3.10; Hemoglobin 13.3; Potassium 3.9; Sodium 135   Lipid Panel    Component Value Date/Time   CHOL 80 05/15/2021 0750   CHOL 125 03/17/2016 1226   TRIG 43 05/15/2021 0750   HDL 50 05/15/2021 0750   HDL 41 03/17/2016 1226   CHOLHDL 1.6 05/15/2021 0750   VLDL 9 05/15/2021 0750   LDLCALC 21 05/15/2021 0750   LDLCALC 66 03/17/2016 1226     Other studies Reviewed: Additional studies/ records that were reviewed today with results demonstrating: labs reviewed.   ASSESSMENT AND PLAN:  CAD: No angina on medical therapy.  Continue Plavix.  ESRD: Now on HD.  Tolerating better.  Carotid disease: moderate in 2023.  Hyperlipidemia: LDL 21 in March 2023 HDL 50 total cholesterol 80 triglycerides 43. continue rosuvastatin and Zetia. Hypertension: The current medical regimen is effective;  continue present plan and medications. Bradycardia- some concern for AFib on prior ECGs.  He has some bleeding issues currently.  WOuld not consider increasing anticoagulation at this time. No syncope    Current medicines are reviewed at length with the patient today.  The patient concerns regarding his medicines were addressed.  The following changes have been made:  No change  Labs/ tests ordered today include:  No orders of the defined types were placed in this encounter.   Recommend 150 minutes/week of aerobic exercise Low fat, low carb, high fiber diet recommended  Disposition:   FU in 1 year   Signed, Larae Grooms, MD  11/22/2021 11:49 AM    [facility] [place], [place],   [address] Phone: [phone]; Fax: [phone]

## 2021-11-22 ENCOUNTER — Ambulatory Visit: Payer: Medicare HMO | Attending: Interventional Cardiology | Admitting: Interventional Cardiology

## 2021-11-22 ENCOUNTER — Encounter: Payer: Self-pay | Admitting: Interventional Cardiology

## 2021-11-22 VITALS — BP 142/56 | HR 69 | Ht 69.0 in | Wt 198.2 lb

## 2021-11-22 DIAGNOSIS — N186 End stage renal disease: Secondary | ICD-10-CM

## 2021-11-22 DIAGNOSIS — E782 Mixed hyperlipidemia: Secondary | ICD-10-CM | POA: Diagnosis not present

## 2021-11-22 DIAGNOSIS — I10 Essential (primary) hypertension: Secondary | ICD-10-CM

## 2021-11-22 DIAGNOSIS — I25119 Atherosclerotic heart disease of native coronary artery with unspecified angina pectoris: Secondary | ICD-10-CM | POA: Diagnosis not present

## 2021-11-22 NOTE — Patient Instructions (Addendum)
Medication Instructions:  Your physician recommends that you continue on your current medications as directed. Please refer to the Current Medication list given to you today.  *If you need a refill on your cardiac medications before your next appointment, please call your pharmacy*   Lab Work: none If you have labs (blood work) drawn today and your tests are completely normal, you will receive your results only by: [facility] (if you have MyChart) OR A paper copy in the mail If you have any lab test that is abnormal or we need to change your treatment, we will call you to review the results.   Testing/Procedures: Your physician has requested that you have a carotid duplex. This test is an ultrasound of the carotid arteries in your neck. It looks at blood flow through these arteries that supply the brain with blood. Allow one hour for this exam. There are no restrictions or special instructions.  To be done in Feb 2024   Follow-Up: At [facility], you and your health needs are our priority.  As part of our continuing mission to provide you with exceptional heart care, we have created designated Provider Care Teams.  These Care Teams include your primary Cardiologist (physician) and Advanced Practice Providers (APPs -  Physician Assistants and Nurse Practitioners) who all work together to provide you with the care you need, when you need it.  We recommend signing up for the patient portal called "MyChart".  Sign up information is provided on this After Visit Summary.  MyChart is used to connect with patients for Virtual Visits (Telemedicine).  Patients are able to view lab/test results, encounter notes, upcoming appointments, etc.  Non-urgent messages can be sent to your provider as well.   To learn more about what you can do with MyChart, go to [email or web address].    Your next appointment:   12 month(s)  The format for your next appointment:   In Person  Provider:    Larae Grooms, MD     Other Instructions    Important Information About Sugar

## 2021-12-09 ENCOUNTER — Telehealth: Payer: Self-pay | Admitting: Pulmonary Disease

## 2021-12-09 NOTE — Telephone Encounter (Signed)
Called patient but he did not answer.   AVS states for him to get his CT done at [facility].

## 2021-12-16 ENCOUNTER — Other Ambulatory Visit: Payer: Self-pay | Admitting: Pulmonary Disease

## 2022-04-07 ENCOUNTER — Ambulatory Visit (HOSPITAL_COMMUNITY)
Admission: RE | Admit: 2022-04-07 | Discharge: 2022-04-07 | Disposition: A | Discharge status: Home / Self Care | Payer: Medicare HMO | Source: Ambulatory Visit | Attending: Interventional Cardiology | Admitting: Interventional Cardiology

## 2022-04-07 DIAGNOSIS — Z9889 Other specified postprocedural states: Secondary | ICD-10-CM

## 2022-04-07 DIAGNOSIS — I6523 Occlusion and stenosis of bilateral carotid arteries: Secondary | ICD-10-CM | POA: Insufficient documentation

## 2022-04-10 ENCOUNTER — Other Ambulatory Visit: Payer: Self-pay | Admitting: *Deleted

## 2022-04-10 DIAGNOSIS — I6523 Occlusion and stenosis of bilateral carotid arteries: Secondary | ICD-10-CM

## 2022-04-10 DIAGNOSIS — Z9889 Other specified postprocedural states: Secondary | ICD-10-CM

## 2022-06-03 ENCOUNTER — Encounter: Payer: Self-pay | Admitting: Pulmonary Disease

## 2022-06-03 ENCOUNTER — Ambulatory Visit (INDEPENDENT_AMBULATORY_CARE_PROVIDER_SITE_OTHER): Payer: Worker's Compensation | Admitting: Pulmonary Disease

## 2022-06-03 VITALS — BP 140/60 | HR 57 | Temp 98.1°F | Ht 69.0 in | Wt 182.0 lb

## 2022-06-03 DIAGNOSIS — J61 Pneumoconiosis due to asbestos and other mineral fibers: Secondary | ICD-10-CM | POA: Diagnosis not present

## 2022-06-03 DIAGNOSIS — J920 Pleural plaque with presence of asbestos: Secondary | ICD-10-CM

## 2022-06-03 NOTE — Progress Notes (Unsigned)
Peter Oneill    [id number]    11-02-1940  Primary Care Physician:Peter Oneill  Referring Physician: Sigmund Hazel, Oneill [address] [place],  [place] [address]  Chief complaint: Follow-up for asbestosis, COPD, emphysema  HPI: Peter Oneill is a 82 y.o.  with past medical history of coronary artery disease status post CABG, hypertension, OSA, asbestosis  He was in the [employer] for 5 years. 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 He reports significant exposure to asbestos for about 28 years in his line of work. He had a chest x-ray that showed interstitial opacities and after a workup he was given a diagnosis of asbestosis in early 1990s and is on Workmen's Compensation. History noted for an episode of hemoptysis in 2001 which was evaluated by CT chest which did not show any evidence of malignancy. He has history of CPAP obstructive sleep apnea and is on CPAP.  He moved from [place] to [place], [place] to be with his daughter and is here to establish care. He was previously followed by Dr. Titus Oneill, Pulmonologist in [place]. Chief complaint today is chronic dyspnea on exertion, associated with cough, white mucus. He denies hemoptysis, wheezing, fevers, chills. He is not on any inhalers. He had been tried on history. Quit 40 years ago.  Had to go on dialysis in 2023 for end-stage renal disease  Interim history: Complains of worsening dyspnea on exertion He is getting annual CTs at [facility] through his insurance company for monitoring of asbestosis but missed his last CT scan in 2023  Outpatient Encounter Medications as of 06/03/2022  Medication Sig   acetaminophen (TYLENOL) 500 MG tablet Take 1,000 mg by mouth every 6 (six) hours as needed for moderate pain.   allopurinol (ZYLOPRIM) 100 MG tablet Take 100 mg by mouth 3 (three) times a week.   amLODipine (NORVASC) 5 MG tablet Take 1 tablet (5 mg total) by mouth daily.    ANORO ELLIPTA 62.5-25 MCG/ACT AEPB USE 1 INHALATION DAILY   clopidogrel (PLAVIX) 75 MG tablet Take 1 tablet (75 mg total) by mouth daily.   ezetimibe (ZETIA) 10 MG tablet Take 10 mg by mouth in the morning.   hydrocortisone (ANUSOL-HC) 25 MG suppository Place 25 mg rectally daily as needed for hemorrhoids or anal itching.   latanoprost (XALATAN) 0.005 % ophthalmic solution Place 1 drop into both eyes at bedtime.   Multiple Vitamin (MULTIVITAMIN) tablet Take 1 tablet by mouth daily.   nitroGLYCERIN (NITROSTAT) 0.4 MG SL tablet Place 1 tablet (0.4 mg total) under the tongue every 5 (five) minutes as needed for chest pain.   Omega-3 Fatty Acids (FISH OIL) 1000 MG CAPS Take 1,000 mg by mouth daily.   rosuvastatin (CRESTOR) 10 MG tablet Take 10 mg by mouth daily.   silodosin (RAPAFLO) 4 MG CAPS capsule Take 4 mg by mouth daily with breakfast.   VELPHORO 500 MG chewable tablet Chew 500 mg by mouth 3 (three) times daily with meals.   [DISCONTINUED] sevelamer carbonate (RENVELA) 800 MG tablet Take 2 tablets (1,600 mg total) by mouth 3 (three) times daily with meals.   No facility-administered encounter medications on file as of 06/03/2022.   Physical Exam: Blood pressure (!) 140/60, pulse (!) 57, temperature 98.1 F (36.7 C), temperature source Oral, height 5\' 9"  (1.753 m), weight 182 lb (82.6 kg), SpO2 92 %. Gen:      No acute distress HEENT:  EOMI, sclera anicteric Neck:     No masses; no thyromegaly Lungs:    Bibasal crackles CV:         Regular rate and rhythm; no murmurs Abd:      + bowel sounds; soft, non-tender; no palpable masses, no distension Ext:    No edema; adequate peripheral perfusion Skin:      Warm and dry; no rash Neuro: alert and oriented x 3 Psych: normal mood and affect ,.  Data Reviewed: Data from [facility] PFTs 07/15/11 FVC 2.5 [62%], FEV1 1.91 (62%], FF 76, TLC 73%, diffusion 80%. Mild restrictive lung disease with reduced total lung capacity. Diffusion is low  normal. Compared to 2011 there is minimal change in spirometry and a more significant drop in diffusion  CT chest 03/08/03-extensive bilateral calcified pleural plaques. No evidence of interstitial lung disease CT chest 05/24/16-slight interval progression of patient's known bilateral calcified pleural plaques. There is mild degree of bibasilar interstitial fibrosis. I do not have the images to review.   Date from [facility] PFTs 06/23/2017 FVC 2.47 (3%], FEV1 1.78 [64%], F/F 72, TLC 68%, RV/TLC 131%, DLCO 60%, DLCO/VA 94% Moderate obstruction, severe restriction with air trapping.  Moderate diffusion impairment  High-resolution CT chest 06/11/2017- extensive calcified pleural plaques, subpleural reticulation, mild emphysema.  Aortic atherosclerosis I reviewed the images personally  High-resolution CT [facility] 10/19/2018- Bilateral bulky, partially calcified pleural plaques are consistent with the history of prior asbestos exposure. There is mild bibasilar pleural parenchymal banding, though no honeycombing or reticulation to suggest interstitial lung disease. A small loculated right pleural effusion is present.   High-resolution CT [facility] 09/12/2019-no interstitial lung disease, stable stable bulky bilateral partially calcified pleural plaques and a small chronic right pleural effusion.   High-resolution CT [facility] 12/21/2020 1. Stable appearance of widespread pleural plaques with mild bandlike areas of scarring greatest in the bases. No typical widespread fibrotic changes.  2. No acute findings.   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 His CT scan shows extensive calcified pleural plaques. He'll need to be monitored annually with lung imaging and pulmonary function tests.  Unfortunately he gets his imaging at [facility] as this is where his workman comp sends him.   He is overdue for  another scan and will get that scheduled as he has increasing dyspnea Schedule PFTs and follow-up in 3 months  COPD, emphysema Continue Anoro PFTs reviewed with improvement in obstruction and restriction or diffusion impairment This is likely due to his recent exercise regimen.  Congratulated him and encouraged him to continue  OSA Intolerant of CPAP.  Does not want to retry  Plan/Recommendations: - Continue Anoro - Continue annual monitoring.  Will need CT and PFTs ordered for this year.  Chilton Greathouse Oneill [facility] 06/03/2022, 2:23 PM  CC: Peter Oneill

## 2022-06-03 NOTE — Patient Instructions (Signed)
Will get CT ordered which may have to be done through [facility] as per [employer] Will order PFTs and follow-up in 3 months.

## 2022-06-06 ENCOUNTER — Telehealth: Payer: Self-pay | Admitting: Pulmonary Disease

## 2022-06-06 NOTE — Telephone Encounter (Signed)
Left message for Peter Oneill.

## 2022-06-06 NOTE — Telephone Encounter (Signed)
Adjuster calling to go ahead to get the order and ov notes to get Ct sched.pt having lots of breathing issues And wants the order faxed to them due it being Worker Comp Fax#[phone] KCL:EXNTZ Claim #[id number]

## 2022-06-06 NOTE — Telephone Encounter (Signed)
Adjuster calling to go ahead to get the order and ov notes to get Ct sched.pt having lots of breathing issues And wants the order faxed to them due it being Worker Comp Fax#[phone] Att:patty Claim #[id number]  

## 2022-06-06 NOTE — Telephone Encounter (Signed)
Spoke to [place] order has been faxed with the office note

## 2022-06-20 ENCOUNTER — Telehealth: Payer: Self-pay | Admitting: Pulmonary Disease

## 2022-06-20 NOTE — Telephone Encounter (Signed)
Wife dropped off CT CD, Dropped it in mailbox

## 2022-06-20 NOTE — Telephone Encounter (Signed)
CD received of HRCT 06/18/22 at [facility].  Image uploaded through [facility] to PACS.  Image is available to view in PACS.   Message routed to Dr. [name] to advise on HRCT results

## 2022-06-20 NOTE — Telephone Encounter (Signed)
Misty Stanley routing this to you as an Financial planner

## 2022-06-24 NOTE — Telephone Encounter (Signed)
Please let patient know that his CT shows stable changes of asbestosis in the lungs.  The additional findings of mild enlargement of the heart and fluid in the abdomen.  I would advise that he check with his primary care regarding these findings.

## 2022-06-24 NOTE — Telephone Encounter (Signed)
Called and spoke with patient.  Dr. Shirlee More  results and recommendations given. Understanding stated. Patient stated he had upcoming Ov scheduled with PCP in June 2024.  Nothing further at this time.

## 2022-07-02 ENCOUNTER — Encounter: Payer: Self-pay | Admitting: Interventional Cardiology

## 2022-08-12 ENCOUNTER — Other Ambulatory Visit: Payer: Self-pay | Admitting: Interventional Cardiology

## 2022-09-01 ENCOUNTER — Ambulatory Visit: Payer: Medicare HMO | Admitting: Pulmonary Disease

## 2022-09-01 ENCOUNTER — Encounter: Payer: Medicare HMO | Admitting: Pulmonary Disease

## 2022-09-08 ENCOUNTER — Ambulatory Visit (INDEPENDENT_AMBULATORY_CARE_PROVIDER_SITE_OTHER): Payer: Worker's Compensation | Admitting: Pulmonary Disease

## 2022-09-08 DIAGNOSIS — J920 Pleural plaque with presence of asbestos: Secondary | ICD-10-CM

## 2022-09-08 LAB — PULMONARY FUNCTION TEST
DL/VA % pred: 91 %
DL/VA: 3.59 ml/min/mmHg/L
DLCO cor % pred: 120 %
DLCO cor: 27.53 ml/min/mmHg
DLCO unc % pred: 120 %
DLCO unc: 27.53 ml/min/mmHg
FEF 25-75 Post: 1.12 L/sec
FEF 25-75 Pre: 1.11 L/sec
FEF2575-%Change-Post: 0 %
FEF2575-%Pred-Post: 64 %
FEF2575-%Pred-Pre: 63 %
FEV1-%Change-Post: 0 %
FEV1-%Pred-Post: 45 %
FEV1-%Pred-Pre: 45 %
FEV1-Post: 1.17 L
FEV1-Pre: 1.17 L
FEV1FVC-%Change-Post: 1 %
FEV1FVC-%Pred-Pre: 110 %
FEV6-%Change-Post: -3 %
FEV6-%Pred-Post: 42 %
FEV6-%Pred-Pre: 43 %
FEV6-Post: 1.42 L
FEV6-Pre: 1.47 L
FEV6FVC-%Pred-Post: 107 %
FEV6FVC-%Pred-Pre: 107 %
FVC-%Change-Post: -1 %
FVC-%Pred-Post: 40 %
FVC-%Pred-Pre: 40 %
FVC-Post: 1.46 L
FVC-Pre: 1.48 L
Post FEV1/FVC ratio: 80 %
Post FEV6/FVC ratio: 100 %
Pre FEV1/FVC ratio: 79 %
Pre FEV6/FVC Ratio: 100 %
RV % pred: 73 %
RV: 1.89 L
TLC % pred: 46 %
TLC: 3.13 L

## 2022-09-08 NOTE — Progress Notes (Signed)
 Full PFT performed today. °

## 2022-09-08 NOTE — Patient Instructions (Signed)
 Full PFT performed today. °

## 2022-09-09 ENCOUNTER — Encounter: Payer: Self-pay | Admitting: Pulmonary Disease

## 2022-09-09 ENCOUNTER — Ambulatory Visit (INDEPENDENT_AMBULATORY_CARE_PROVIDER_SITE_OTHER): Payer: Worker's Compensation | Admitting: Pulmonary Disease

## 2022-09-09 VITALS — BP 142/54 | HR 80 | Temp 97.7°F | Ht 68.0 in | Wt 179.6 lb

## 2022-09-09 DIAGNOSIS — J920 Pleural plaque with presence of asbestos: Secondary | ICD-10-CM

## 2022-09-09 NOTE — Patient Instructions (Signed)
Will get a CT angiogram for better evaluation of shortness of breath Follow-up in 3 months.

## 2022-09-09 NOTE — Progress Notes (Signed)
Peter Oneill    [id number]    [birth year]  Primary Care Physician:Miller, Misty Stanley, MD  Referring Physician: Sigmund Hazel, MD [address] [place],  [place] [address]  Chief complaint: Follow-up for asbestosis, COPD, emphysema  HPI: Peter Oneill is a 82 y.o.  with past medical history of coronary artery disease status post CABG, hypertension, OSA, asbestosis  He was in the [employer] for 5 years. 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 He reports significant exposure to asbestos for about 28 years in his line of work. He had a chest x-ray that showed interstitial opacities and after a workup he was given a diagnosis of asbestosis in early 1990s and is on Workmen's Compensation. History noted for an episode of hemoptysis in 2001 which was evaluated by CT chest which did not show any evidence of malignancy. He has history of CPAP obstructive sleep apnea and is on CPAP.  He moved from [place] to [place], [place] to be with his daughter and is here to establish care. He was previously followed by Peter Oneill, Pulmonologist in [place]. Chief complaint today is chronic dyspnea on exertion, associated with cough, white mucus. He denies hemoptysis, wheezing, fevers, chills. He is not on any inhalers. He had been tried on history. Quit 40 years ago.  Had to go on dialysis in 2023 for end-stage renal disease  Interim history: Complains of worsening dyspnea on exertion which increased over a month ago.  He cannot take a deep breath and gets short of breath with minimal exertion He is getting annual CTs done at [facility] through [facility]. Currently on dialysis which is started a year ago   Outpatient Encounter Medications as of 09/09/2022  Medication Sig   acetaminophen (TYLENOL) 500 MG tablet Take 1,000 mg by mouth every 6 (six) hours as needed for moderate pain.   allopurinol (ZYLOPRIM) 100 MG tablet Take 100 mg by mouth 3  (three) times a week.   amLODipine (NORVASC) 5 MG tablet Take 1 tablet (5 mg total) by mouth daily.   ANORO ELLIPTA 62.5-25 MCG/ACT AEPB USE 1 INHALATION DAILY   clopidogrel (PLAVIX) 75 MG tablet Take 1 tablet (75 mg total) by mouth daily. Please call [phone] to schedule a September appointment for future refills. Thank you.   ezetimibe (ZETIA) 10 MG tablet Take 10 mg by mouth in the morning.   hydrocortisone (ANUSOL-HC) 25 MG suppository Place 25 mg rectally daily as needed for hemorrhoids or anal itching.   latanoprost (XALATAN) 0.005 % ophthalmic solution Place 1 drop into both eyes at bedtime.   Multiple Vitamin (MULTIVITAMIN) tablet Take 1 tablet by mouth daily.   multivitamin (RENA-VIT) TABS tablet Take 1 tablet by mouth daily.   nitroGLYCERIN (NITROSTAT) 0.4 MG SL tablet Place 1 tablet (0.4 mg total) under the tongue every 5 (five) minutes as needed for chest pain.   Omega-3 Fatty Acids (FISH OIL) 1000 MG CAPS Take 1,000 mg by mouth daily.   rosuvastatin (CRESTOR) 10 MG tablet Take 10 mg by mouth daily.   silodosin (RAPAFLO) 4 MG CAPS capsule Take 4 mg by mouth daily with breakfast.   VELPHORO 500 MG chewable tablet Chew 500 mg by mouth 3 (three) times daily with meals.   No facility-administered encounter medications on file as of 09/09/2022.   Physical Exam: Blood pressure (!) 142/54, pulse 80, temperature 97.7 F (36.5 C), temperature source Oral, height 5\' 8"  (1.727  m), weight 179 lb 9.6 oz (81.5 kg), SpO2 96%. Gen:      No acute distress HEENT:  EOMI, sclera anicteric Neck:     No masses; no thyromegaly Lungs:    Bibasal crackles CV:         Regular rate and rhythm; no murmurs Abd:      + bowel sounds; soft, non-tender; no palpable masses, no distension Ext:    No edema; adequate peripheral perfusion Skin:      Warm and dry; no rash Neuro: alert and oriented x 3 Psych: normal mood and affect   Data Reviewed: Data from [place] PFTs 07/15/11 FVC 2.5 [62%], FEV1 1.91 (62%],  FF 76, TLC 73%, diffusion 80%. Mild restrictive lung disease with reduced total lung capacity. Diffusion is low normal. Compared to 2011 there is minimal change in spirometry and a more significant drop in diffusion  CT chest 03/08/03-extensive bilateral calcified pleural plaques. No evidence of interstitial lung disease CT chest 05/24/16-slight interval progression of patient's known bilateral calcified pleural plaques. There is mild degree of bibasilar interstitial fibrosis. I do not have the images to review.   Date from [place] PFTs 06/23/2017 FVC 2.47 (3%], FEV1 1.78 [64%], F/F 72, TLC 68%, RV/TLC 131%, DLCO 60%, DLCO/VA 94% Moderate obstruction, severe restriction with air trapping.  Moderate diffusion impairment  High-resolution CT chest 06/11/2017- extensive calcified pleural plaques, subpleural reticulation, mild emphysema.  Aortic atherosclerosis I reviewed the images personally  High-resolution CT [facility] 10/19/2018- Bilateral bulky, partially calcified pleural plaques are consistent with the history of prior asbestos exposure. There is mild bibasilar pleural parenchymal banding, though no honeycombing or reticulation to suggest interstitial lung disease. A small loculated right pleural effusion is present.   High-resolution CT [facility] 09/12/2019-no interstitial lung disease, stable stable bulky bilateral partially calcified pleural plaques and a small chronic right pleural effusion.   High-resolution CT [facility] 12/21/2020 1. Stable appearance of widespread pleural plaques with mild bandlike areas of scarring greatest in the bases. No typical widespread fibrotic changes.  2. No acute findings.  High resolution CT 06/18/2022 1.  Stable multiple bilateral pleural plaques with adjacent pleural thickening and bandlike areas of scarring.  2.  Interval development in small amount of abdominal ascites.  3.  Cardiomegaly.  4.  Cholelithiasis.    Assessment:  Restrictive lung disease secondary to  asbestos exposure Asbestosis Pleural plaques Peter Oneill has asbestosis from significant asbestos exposure. His CT scan shows extensive calcified pleural plaques. He'll need to be monitored annually with lung imaging and pulmonary function tests.  Unfortunately he gets his imaging at [facility] as this is where his workman comp sends him.   His CT shows stable changes of pleural plaques.  However PFTs show significant reduction in lung volumes and increased dyspnea.  Not sure if this is related to fluid overload from dialysis but there is evidence of cardiomegaly on his CT scan as well  Will evaluate him with a CT angiogram and echocardiogram since he is very dyspneic  COPD, emphysema Continue Anoro  OSA Intolerant of CPAP.  Does not want to retry  Plan/Recommendations: - Continue Anoro - Echocardiogram, CT angiogram   Chilton Greathouse MD [facility] 09/09/2022, 1:45 PM  CC: Peter Hazel, MD

## 2022-09-11 ENCOUNTER — Telehealth: Payer: Self-pay | Admitting: Pulmonary Disease

## 2022-09-11 NOTE — Telephone Encounter (Signed)
Called and spoke with Patty, worker's comp.  Patty stated she was not sure worker's comp would cover echo, because that would be heart related and not lung.  I did read parts of last OV note and she does have a copy to review.  Patient diagnosis was given for asbestos induced pleural plaque.  Patty stated she would review more and call if anything further is needed. Nothing further at this time.

## 2022-09-25 ENCOUNTER — Other Ambulatory Visit: Payer: Self-pay

## 2022-09-25 ENCOUNTER — Telehealth: Payer: Self-pay | Admitting: Pulmonary Disease

## 2022-09-25 MED ORDER — ANORO ELLIPTA 62.5-25 MCG/ACT IN AEPB: 1.0000 | INHALATION_SPRAY | Freq: Every day | RESPIRATORY_TRACT | 2 refills | Status: DC

## 2022-09-25 NOTE — Telephone Encounter (Signed)
Spoke to patient's spouse, Georgette(DPR). She stated that patient has been without Anoro for three days, as he ran out of medication and has not been able to get a refill. I have sent Rx to [facility] per Georgette's request.  Per Georgette, patient has developed increased SOB, prod cough(unsure of color) and wheezing since being without anoro.  Denies f/c/s or additional sx.  No recent covid test.   Dr. [name], please advise. Thanks

## 2022-09-25 NOTE — Telephone Encounter (Signed)
I called and spoke with the patient. He is starting to feel better. Nothing further needed

## 2022-09-25 NOTE — Telephone Encounter (Signed)
Patient called because his pharmacy said they have not received the order for the patient's inhaler, Anora.  He stated he has been out for three days and is having breathing problems.  Please advise and call patient to confirm.  CB# [phone]

## 2022-10-09 ENCOUNTER — Telehealth: Payer: Self-pay | Admitting: Pulmonary Disease

## 2022-10-09 NOTE — Telephone Encounter (Signed)
Patient would like to know if he should bring in CT scan results. Patient phone number is [phone].

## 2022-10-10 NOTE — Telephone Encounter (Signed)
I called and spoke with the patient's wife. CT at [facility] on 8/12 by report does not show PE and lung findings appear stable.  They will bring in the CD images when able.   Nothing further needed.

## 2022-10-27 ENCOUNTER — Other Ambulatory Visit: Payer: Self-pay | Admitting: Interventional Cardiology

## 2022-12-05 ENCOUNTER — Ambulatory Visit: Payer: Medicare HMO | Admitting: Pulmonary Disease

## 2022-12-25 ENCOUNTER — Emergency Department (HOSPITAL_COMMUNITY): Payer: Medicare HMO

## 2022-12-25 ENCOUNTER — Inpatient Hospital Stay (HOSPITAL_COMMUNITY)
Admission: EM | Admit: 2022-12-25 | Discharge: 2023-01-25 | DRG: 853 | Disposition: E | Payer: Medicare HMO | Attending: Internal Medicine | Admitting: Internal Medicine

## 2022-12-25 ENCOUNTER — Encounter (HOSPITAL_COMMUNITY): Payer: Self-pay

## 2022-12-25 ENCOUNTER — Other Ambulatory Visit: Payer: Self-pay

## 2022-12-25 ENCOUNTER — Emergency Department (HOSPITAL_COMMUNITY): Payer: Medicare HMO | Admitting: Certified Registered Nurse Anesthetist

## 2022-12-25 ENCOUNTER — Encounter (HOSPITAL_COMMUNITY): Admission: EM | Disposition: E | Payer: Self-pay | Source: Home / Self Care | Attending: Pulmonary Disease

## 2022-12-25 DIAGNOSIS — Z6826 Body mass index (BMI) 26.0-26.9, adult: Secondary | ICD-10-CM

## 2022-12-25 DIAGNOSIS — E440 Moderate protein-calorie malnutrition: Secondary | ICD-10-CM | POA: Diagnosis not present

## 2022-12-25 DIAGNOSIS — I493 Ventricular premature depolarization: Secondary | ICD-10-CM | POA: Diagnosis not present

## 2022-12-25 DIAGNOSIS — M898X9 Other specified disorders of bone, unspecified site: Secondary | ICD-10-CM | POA: Diagnosis present

## 2022-12-25 DIAGNOSIS — K55059 Acute (reversible) ischemia of intestine, part and extent unspecified: Secondary | ICD-10-CM

## 2022-12-25 DIAGNOSIS — T8110XA Postprocedural shock unspecified, initial encounter: Secondary | ICD-10-CM | POA: Diagnosis present

## 2022-12-25 DIAGNOSIS — R4182 Altered mental status, unspecified: Secondary | ICD-10-CM | POA: Diagnosis not present

## 2022-12-25 DIAGNOSIS — Z951 Presence of aortocoronary bypass graft: Secondary | ICD-10-CM

## 2022-12-25 DIAGNOSIS — K559 Vascular disorder of intestine, unspecified: Principal | ICD-10-CM

## 2022-12-25 DIAGNOSIS — R40243 Glasgow coma scale score 3-8, unspecified time: Secondary | ICD-10-CM

## 2022-12-25 DIAGNOSIS — K7200 Acute and subacute hepatic failure without coma: Secondary | ICD-10-CM | POA: Diagnosis not present

## 2022-12-25 DIAGNOSIS — R401 Stupor: Secondary | ICD-10-CM | POA: Diagnosis not present

## 2022-12-25 DIAGNOSIS — E876 Hypokalemia: Secondary | ICD-10-CM | POA: Diagnosis not present

## 2022-12-25 DIAGNOSIS — I739 Peripheral vascular disease, unspecified: Secondary | ICD-10-CM | POA: Diagnosis present

## 2022-12-25 DIAGNOSIS — A419 Sepsis, unspecified organism: Secondary | ICD-10-CM | POA: Diagnosis present

## 2022-12-25 DIAGNOSIS — D689 Coagulation defect, unspecified: Secondary | ICD-10-CM | POA: Diagnosis present

## 2022-12-25 DIAGNOSIS — D6959 Other secondary thrombocytopenia: Secondary | ICD-10-CM | POA: Diagnosis not present

## 2022-12-25 DIAGNOSIS — D631 Anemia in chronic kidney disease: Secondary | ICD-10-CM | POA: Diagnosis present

## 2022-12-25 DIAGNOSIS — D6489 Other specified anemias: Secondary | ICD-10-CM | POA: Diagnosis not present

## 2022-12-25 DIAGNOSIS — I120 Hypertensive chronic kidney disease with stage 5 chronic kidney disease or end stage renal disease: Secondary | ICD-10-CM | POA: Diagnosis present

## 2022-12-25 DIAGNOSIS — G9341 Metabolic encephalopathy: Secondary | ICD-10-CM | POA: Diagnosis not present

## 2022-12-25 DIAGNOSIS — I639 Cerebral infarction, unspecified: Secondary | ICD-10-CM | POA: Diagnosis not present

## 2022-12-25 DIAGNOSIS — R579 Shock, unspecified: Secondary | ICD-10-CM | POA: Diagnosis not present

## 2022-12-25 DIAGNOSIS — Z888 Allergy status to other drugs, medicaments and biological substances status: Secondary | ICD-10-CM

## 2022-12-25 DIAGNOSIS — Z8249 Family history of ischemic heart disease and other diseases of the circulatory system: Secondary | ICD-10-CM

## 2022-12-25 DIAGNOSIS — Z9911 Dependence on respirator [ventilator] status: Secondary | ICD-10-CM

## 2022-12-25 DIAGNOSIS — I708 Atherosclerosis of other arteries: Secondary | ICD-10-CM | POA: Diagnosis present

## 2022-12-25 DIAGNOSIS — D62 Acute posthemorrhagic anemia: Secondary | ICD-10-CM | POA: Diagnosis not present

## 2022-12-25 DIAGNOSIS — G4733 Obstructive sleep apnea (adult) (pediatric): Secondary | ICD-10-CM | POA: Diagnosis present

## 2022-12-25 DIAGNOSIS — R578 Other shock: Secondary | ICD-10-CM | POA: Diagnosis not present

## 2022-12-25 DIAGNOSIS — Z7951 Long term (current) use of inhaled steroids: Secondary | ICD-10-CM

## 2022-12-25 DIAGNOSIS — Z79899 Other long term (current) drug therapy: Secondary | ICD-10-CM

## 2022-12-25 DIAGNOSIS — Z7902 Long term (current) use of antithrombotics/antiplatelets: Secondary | ICD-10-CM

## 2022-12-25 DIAGNOSIS — Z992 Dependence on renal dialysis: Secondary | ICD-10-CM

## 2022-12-25 DIAGNOSIS — K551 Chronic vascular disorders of intestine: Secondary | ICD-10-CM | POA: Diagnosis present

## 2022-12-25 DIAGNOSIS — J61 Pneumoconiosis due to asbestos and other mineral fibers: Secondary | ICD-10-CM | POA: Diagnosis present

## 2022-12-25 DIAGNOSIS — R739 Hyperglycemia, unspecified: Secondary | ICD-10-CM | POA: Diagnosis not present

## 2022-12-25 DIAGNOSIS — R29731 NIHSS score 31: Secondary | ICD-10-CM | POA: Diagnosis not present

## 2022-12-25 DIAGNOSIS — I6522 Occlusion and stenosis of left carotid artery: Secondary | ICD-10-CM | POA: Diagnosis present

## 2022-12-25 DIAGNOSIS — R001 Bradycardia, unspecified: Secondary | ICD-10-CM | POA: Diagnosis not present

## 2022-12-25 DIAGNOSIS — R569 Unspecified convulsions: Secondary | ICD-10-CM | POA: Diagnosis not present

## 2022-12-25 DIAGNOSIS — Z66 Do not resuscitate: Secondary | ICD-10-CM | POA: Diagnosis not present

## 2022-12-25 DIAGNOSIS — N186 End stage renal disease: Secondary | ICD-10-CM

## 2022-12-25 DIAGNOSIS — J9602 Acute respiratory failure with hypercapnia: Secondary | ICD-10-CM | POA: Diagnosis present

## 2022-12-25 DIAGNOSIS — K55049 Acute infarction of large intestine, extent unspecified: Secondary | ICD-10-CM | POA: Diagnosis present

## 2022-12-25 DIAGNOSIS — Z87891 Personal history of nicotine dependence: Secondary | ICD-10-CM

## 2022-12-25 DIAGNOSIS — Z515 Encounter for palliative care: Secondary | ICD-10-CM

## 2022-12-25 DIAGNOSIS — K55039 Acute (reversible) ischemia of large intestine, extent unspecified: Secondary | ICD-10-CM | POA: Diagnosis present

## 2022-12-25 DIAGNOSIS — E875 Hyperkalemia: Secondary | ICD-10-CM | POA: Diagnosis not present

## 2022-12-25 DIAGNOSIS — E8721 Acute metabolic acidosis: Secondary | ICD-10-CM | POA: Diagnosis present

## 2022-12-25 DIAGNOSIS — Z9889 Other specified postprocedural states: Secondary | ICD-10-CM

## 2022-12-25 DIAGNOSIS — Z7901 Long term (current) use of anticoagulants: Secondary | ICD-10-CM

## 2022-12-25 DIAGNOSIS — R7989 Other specified abnormal findings of blood chemistry: Secondary | ICD-10-CM | POA: Diagnosis present

## 2022-12-25 DIAGNOSIS — R0602 Shortness of breath: Secondary | ICD-10-CM | POA: Diagnosis present

## 2022-12-25 DIAGNOSIS — Z955 Presence of coronary angioplasty implant and graft: Secondary | ICD-10-CM

## 2022-12-25 DIAGNOSIS — R6521 Severe sepsis with septic shock: Secondary | ICD-10-CM | POA: Diagnosis present

## 2022-12-25 DIAGNOSIS — E871 Hypo-osmolality and hyponatremia: Secondary | ICD-10-CM | POA: Diagnosis not present

## 2022-12-25 DIAGNOSIS — J9601 Acute respiratory failure with hypoxia: Secondary | ICD-10-CM | POA: Diagnosis not present

## 2022-12-25 DIAGNOSIS — E7800 Pure hypercholesterolemia, unspecified: Secondary | ICD-10-CM | POA: Diagnosis present

## 2022-12-25 DIAGNOSIS — E8729 Other acidosis: Secondary | ICD-10-CM | POA: Diagnosis not present

## 2022-12-25 DIAGNOSIS — K660 Peritoneal adhesions (postprocedural) (postinfection): Secondary | ICD-10-CM | POA: Diagnosis present

## 2022-12-25 DIAGNOSIS — Z9981 Dependence on supplemental oxygen: Secondary | ICD-10-CM

## 2022-12-25 DIAGNOSIS — J439 Emphysema, unspecified: Secondary | ICD-10-CM | POA: Diagnosis present

## 2022-12-25 DIAGNOSIS — R571 Hypovolemic shock: Secondary | ICD-10-CM | POA: Diagnosis not present

## 2022-12-25 DIAGNOSIS — I480 Paroxysmal atrial fibrillation: Secondary | ICD-10-CM | POA: Diagnosis present

## 2022-12-25 DIAGNOSIS — I2510 Atherosclerotic heart disease of native coronary artery without angina pectoris: Secondary | ICD-10-CM | POA: Diagnosis present

## 2022-12-25 HISTORY — DX: End stage renal disease: N18.6

## 2022-12-25 HISTORY — PX: APPLICATION OF WOUND VAC: SHX5189

## 2022-12-25 HISTORY — PX: ULTRASOUND GUIDANCE FOR VASCULAR ACCESS: SHX6516

## 2022-12-25 HISTORY — PX: AORTOGRAM: SHX6300

## 2022-12-25 HISTORY — PX: LAPAROTOMY: SHX154

## 2022-12-25 HISTORY — PX: INSERTION OF ILIAC STENT: SHX6256

## 2022-12-25 LAB — COMPREHENSIVE METABOLIC PANEL
ALT: 40 U/L (ref 0–44)
AST: 48 U/L — ABNORMAL HIGH (ref 15–41)
Albumin: 2.8 g/dL — ABNORMAL LOW (ref 3.5–5.0)
Alkaline Phosphatase: 218 U/L — ABNORMAL HIGH (ref 38–126)
Anion gap: 13 (ref 5–15)
BUN: 26 mg/dL — ABNORMAL HIGH (ref 8–23)
CO2: 30 mmol/L (ref 22–32)
Calcium: 8.9 mg/dL (ref 8.9–10.3)
Chloride: 95 mmol/L — ABNORMAL LOW (ref 98–111)
Creatinine, Ser: 3.71 mg/dL — ABNORMAL HIGH (ref 0.61–1.24)
GFR, Estimated: 16 mL/min — ABNORMAL LOW (ref >60)
Glucose, Bld: 179 mg/dL — ABNORMAL HIGH (ref 70–99)
Potassium: 3.9 mmol/L (ref 3.5–5.1)
Sodium: 138 mmol/L (ref 135–145)
Total Bilirubin: 0.9 mg/dL (ref 0.3–1.2)
Total Protein: 8.3 g/dL — ABNORMAL HIGH (ref 6.5–8.1)

## 2022-12-25 LAB — CBC
HCT: 35.3 % — ABNORMAL LOW (ref 39.0–52.0)
Hemoglobin: 11.2 g/dL — ABNORMAL LOW (ref 13.0–17.0)
MCH: 31.7 pg (ref 26.0–34.0)
MCHC: 31.7 g/dL (ref 30.0–36.0)
MCV: 100 fL (ref 80.0–100.0)
Platelets: 233 10*3/uL (ref 150–400)
RBC: 3.53 MIL/uL — ABNORMAL LOW (ref 4.22–5.81)
RDW: 15.6 % — ABNORMAL HIGH (ref 11.5–15.5)
WBC: 6.3 10*3/uL (ref 4.0–10.5)
nRBC: 0 % (ref 0.0–0.2)

## 2022-12-25 LAB — TROPONIN I (HIGH SENSITIVITY)
Troponin I (High Sensitivity): 80 ng/L — ABNORMAL HIGH (ref <18)
Troponin I (High Sensitivity): 74 ng/L — ABNORMAL HIGH (ref <18)

## 2022-12-25 LAB — LACTIC ACID, PLASMA: Lactic Acid, Venous: 1.4 mmol/L (ref 0.5–1.9)

## 2022-12-25 LAB — ABO/RH: ABO/RH(D): O POS

## 2022-12-25 LAB — BRAIN NATRIURETIC PEPTIDE: B Natriuretic Peptide: 1345.2 pg/mL — ABNORMAL HIGH (ref 0.0–100.0)

## 2022-12-25 LAB — LIPASE, BLOOD: Lipase: 41 U/L (ref 11–51)

## 2022-12-25 SURGERY — INSERTION, STENT, ARTERY, ILIAC
Anesthesia: General | Site: Groin | Laterality: Right

## 2022-12-25 MED ORDER — PROPOFOL 10 MG/ML IV BOLUS
INTRAVENOUS | Status: AC
  Filled 2022-12-25: qty 20

## 2022-12-25 MED ORDER — POLYETHYLENE GLYCOL 3350 17 G PO PACK: 17.0000 g | PACK | Freq: Every day | ORAL | Status: DC

## 2022-12-25 MED ORDER — INSULIN ASPART 100 UNIT/ML IJ SOLN
0.0000 [IU] | INTRAMUSCULAR | Status: DC
Start: 2022-12-26 — End: 2022-12-26
  Administered 2022-12-26 (×2): 2 [IU] via SUBCUTANEOUS

## 2022-12-25 MED ORDER — SUCCINYLCHOLINE CHLORIDE 200 MG/10ML IV SOSY
PREFILLED_SYRINGE | INTRAVENOUS | Status: AC
  Filled 2022-12-25: qty 10

## 2022-12-25 MED ORDER — IODIXANOL 320 MG/ML IV SOLN
INTRAVENOUS | Status: DC | PRN
  Administered 2022-12-25: 100 mL

## 2022-12-25 MED ORDER — IOHEXOL 350 MG/ML SOLN
80.0000 mL | Freq: Once | INTRAVENOUS | Status: AC | PRN
  Administered 2022-12-25: 80 mL via INTRAVENOUS

## 2022-12-25 MED ORDER — HYDROMORPHONE HCL 1 MG/ML IJ SOLN
1.0000 mg | Freq: Once | INTRAMUSCULAR | Status: AC
  Administered 2022-12-25: 1 mg via INTRAVENOUS
  Filled 2022-12-25: qty 1

## 2022-12-25 MED ORDER — DEXAMETHASONE SODIUM PHOSPHATE 10 MG/ML IJ SOLN
INTRAMUSCULAR | Status: DC | PRN
  Administered 2022-12-25: 10 mg via INTRAVENOUS

## 2022-12-25 MED ORDER — HYDROMORPHONE HCL 1 MG/ML IJ SOLN
0.5000 mg | Freq: Once | INTRAMUSCULAR | Status: AC
  Administered 2022-12-25: 0.5 mg via INTRAVENOUS
  Filled 2022-12-25: qty 1

## 2022-12-25 MED ORDER — FENTANYL CITRATE (PF) 250 MCG/5ML IJ SOLN
INTRAMUSCULAR | Status: DC | PRN
  Administered 2022-12-25: 50 ug via INTRAVENOUS

## 2022-12-25 MED ORDER — SUCCINYLCHOLINE CHLORIDE 200 MG/10ML IV SOSY
PREFILLED_SYRINGE | INTRAVENOUS | Status: DC | PRN
  Administered 2022-12-25: 100 mg via INTRAVENOUS

## 2022-12-25 MED ORDER — ONDANSETRON HCL 4 MG/2ML IJ SOLN
INTRAMUSCULAR | Status: AC
  Filled 2022-12-25: qty 2

## 2022-12-25 MED ORDER — LIDOCAINE 2% (20 MG/ML) 5 ML SYRINGE
INTRAMUSCULAR | Status: AC
  Filled 2022-12-25: qty 5

## 2022-12-25 MED ORDER — DEXAMETHASONE SODIUM PHOSPHATE 10 MG/ML IJ SOLN
INTRAMUSCULAR | Status: AC
  Filled 2022-12-25: qty 1

## 2022-12-25 MED ORDER — PIPERACILLIN-TAZOBACTAM IN DEX 2-0.25 GM/50ML IV SOLN
2.2500 g | Freq: Three times a day (TID) | INTRAVENOUS | Status: DC
  Administered 2022-12-26 (×3): 2.25 g via INTRAVENOUS
  Filled 2022-12-25 (×4): qty 50

## 2022-12-25 MED ORDER — PHENYLEPHRINE HCL-NACL 20-0.9 MG/250ML-% IV SOLN
INTRAVENOUS | Status: DC | PRN
  Administered 2022-12-25: 40 ug/min via INTRAVENOUS

## 2022-12-25 MED ORDER — PROPOFOL 10 MG/ML IV BOLUS
INTRAVENOUS | Status: DC | PRN
  Administered 2022-12-25: 100 mg via INTRAVENOUS

## 2022-12-25 MED ORDER — LIDOCAINE 2% (20 MG/ML) 5 ML SYRINGE
INTRAMUSCULAR | Status: DC | PRN
  Administered 2022-12-25: 40 mg via INTRAVENOUS

## 2022-12-25 MED ORDER — HEPARIN 6000 UNIT IRRIGATION SOLUTION
Status: DC | PRN
  Administered 2022-12-25: 1

## 2022-12-25 MED ORDER — LATANOPROST 0.005 % OP SOLN
1.0000 [drp] | Freq: Every day | OPHTHALMIC | Status: DC
Start: 2022-12-25 — End: 2023-01-10
  Administered 2022-12-26 – 2023-01-09 (×15): 1 [drp] via OPHTHALMIC
  Filled 2022-12-25 (×3): qty 2.5

## 2022-12-25 MED ORDER — SODIUM CHLORIDE 0.9 % IV SOLN: INTRAVENOUS | Status: DC | PRN

## 2022-12-25 MED ORDER — DOCUSATE SODIUM 50 MG/5ML PO LIQD: 100.0000 mg | Freq: Two times a day (BID) | ORAL | Status: DC

## 2022-12-25 MED ORDER — 0.9 % SODIUM CHLORIDE (POUR BTL) OPTIME
TOPICAL | Status: DC | PRN
  Administered 2022-12-25: 1000 mL
  Administered 2022-12-25: 2000 mL
  Administered 2022-12-26: 1000 mL

## 2022-12-25 MED ORDER — ROCURONIUM BROMIDE 10 MG/ML (PF) SYRINGE
PREFILLED_SYRINGE | INTRAVENOUS | Status: DC | PRN
  Administered 2022-12-25 – 2022-12-26 (×2): 50 mg via INTRAVENOUS

## 2022-12-25 MED ORDER — POLYETHYLENE GLYCOL 3350 17 G PO PACK: 17.0000 g | PACK | Freq: Every day | ORAL | Status: DC | PRN

## 2022-12-25 MED ORDER — ROCURONIUM BROMIDE 10 MG/ML (PF) SYRINGE
PREFILLED_SYRINGE | INTRAVENOUS | Status: AC
  Filled 2022-12-25: qty 20

## 2022-12-25 MED ORDER — FAMOTIDINE IN NACL 20-0.9 MG/50ML-% IV SOLN
20.0000 mg | INTRAVENOUS | Status: DC
  Administered 2022-12-27 – 2023-01-10 (×15): 20 mg via INTRAVENOUS
  Filled 2022-12-25 (×15): qty 50

## 2022-12-25 MED ORDER — HEPARIN SODIUM (PORCINE) 1000 UNIT/ML IJ SOLN
INTRAMUSCULAR | Status: AC
  Filled 2022-12-25: qty 10

## 2022-12-25 MED ORDER — CEFAZOLIN SODIUM-DEXTROSE 2-3 GM-%(50ML) IV SOLR
INTRAVENOUS | Status: DC | PRN
  Administered 2022-12-25: 2 g via INTRAVENOUS

## 2022-12-25 MED ORDER — DOCUSATE SODIUM 50 MG/5ML PO LIQD: 100.0000 mg | Freq: Two times a day (BID) | ORAL | Status: DC | PRN

## 2022-12-25 MED ORDER — CEFAZOLIN SODIUM 1 G IJ SOLR
INTRAMUSCULAR | Status: AC
  Filled 2022-12-25: qty 20

## 2022-12-25 MED ORDER — HEPARIN 6000 UNIT IRRIGATION SOLUTION
Status: AC
  Filled 2022-12-25: qty 500

## 2022-12-25 MED ORDER — EPHEDRINE SULFATE-NACL 50-0.9 MG/10ML-% IV SOSY
PREFILLED_SYRINGE | INTRAVENOUS | Status: DC | PRN
  Administered 2022-12-25: 5 mg via INTRAVENOUS
  Administered 2022-12-26: 10 mg via INTRAVENOUS
  Administered 2022-12-26 (×2): 5 mg via INTRAVENOUS

## 2022-12-25 MED ORDER — SODIUM CHLORIDE 0.9 % IV BOLUS
1000.0000 mL | Freq: Once | INTRAVENOUS | Status: AC
  Administered 2022-12-25: 1000 mL via INTRAVENOUS

## 2022-12-25 MED ORDER — PHENYLEPHRINE 80 MCG/ML (10ML) SYRINGE FOR IV PUSH (FOR BLOOD PRESSURE SUPPORT)
PREFILLED_SYRINGE | INTRAVENOUS | Status: DC | PRN
  Administered 2022-12-25 (×2): 160 ug via INTRAVENOUS

## 2022-12-25 MED ORDER — HEPARIN SODIUM (PORCINE) 1000 UNIT/ML IJ SOLN
INTRAMUSCULAR | Status: DC | PRN
  Administered 2022-12-25: 8000 [IU] via INTRAVENOUS

## 2022-12-25 MED ORDER — FENTANYL CITRATE (PF) 250 MCG/5ML IJ SOLN
INTRAMUSCULAR | Status: AC
  Filled 2022-12-25: qty 5

## 2022-12-25 MED ORDER — PHENYLEPHRINE 80 MCG/ML (10ML) SYRINGE FOR IV PUSH (FOR BLOOD PRESSURE SUPPORT)
PREFILLED_SYRINGE | INTRAVENOUS | Status: AC
  Filled 2022-12-25: qty 10

## 2022-12-25 SURGICAL SUPPLY — 80 items
APL PRP STRL LF DISP 70% ISPRP (MISCELLANEOUS) ×8
APL SKNCLS STERI-STRIP NONHPOA (GAUZE/BANDAGES/DRESSINGS) ×4
BAG COUNTER SPONGE SURGICOUNT (BAG) ×8 IMPLANT
BAG SPNG CNTER NS LX DISP (BAG) ×8
BENZOIN TINCTURE PRP APPL 2/3 (GAUZE/BANDAGES/DRESSINGS) ×4 IMPLANT
BLADE CLIPPER SURG (BLADE) IMPLANT
CANISTER SUCT 3000ML PPV (MISCELLANEOUS) ×8 IMPLANT
CANISTER WOUND CARE 500ML ATS (WOUND CARE) IMPLANT
CATH BEACON 5 .035 65 C1 TIP (CATHETERS) IMPLANT
CATH BEACON 5 .035 65 KMP TIP (CATHETERS) IMPLANT
CATH OMNI FLUSH .035X70CM (CATHETERS) IMPLANT
CATH OMNI FLUSH 5F 65CM (CATHETERS) ×4 IMPLANT
CHLORAPREP W/TINT 26 (MISCELLANEOUS) ×8 IMPLANT
CLOSURE PERCLOSE PROSTYLE (VASCULAR PRODUCTS) IMPLANT
COVER SURGICAL LIGHT HANDLE (MISCELLANEOUS) ×8 IMPLANT
DRAPE C-ARM 42X72 X-RAY (DRAPES) IMPLANT
DRAPE LAPAROSCOPIC ABDOMINAL (DRAPES) ×8 IMPLANT
DRAPE WARM FLUID 44X44 (DRAPES) ×8 IMPLANT
DRSG OPSITE POSTOP 4X10 (GAUZE/BANDAGES/DRESSINGS) IMPLANT
DRSG OPSITE POSTOP 4X8 (GAUZE/BANDAGES/DRESSINGS) IMPLANT
DRSG TEGADERM 2-3/8X2-3/4 SM (GAUZE/BANDAGES/DRESSINGS) IMPLANT
ELECT BLADE 4.0 EZ CLEAN MEGAD (MISCELLANEOUS) ×4
ELECT BLADE 6.5 EXT (BLADE) IMPLANT
ELECT CAUTERY BLADE 6.4 (BLADE) ×8 IMPLANT
ELECT REM PT RETURN 9FT ADLT (ELECTROSURGICAL) ×8
ELECTRODE BLDE 4.0 EZ CLN MEGD (MISCELLANEOUS) IMPLANT
ELECTRODE REM PT RTRN 9FT ADLT (ELECTROSURGICAL) ×8 IMPLANT
GLIDEWIRE ADV .035X180CM (WIRE) IMPLANT
GLOVE BIO SURGEON STRL SZ 6 (GLOVE) ×8 IMPLANT
GLOVE BIOGEL PI IND STRL 6.5 (GLOVE) IMPLANT
GLOVE BIOGEL PI IND STRL 7.0 (GLOVE) IMPLANT
GLOVE INDICATOR 6.5 STRL GRN (GLOVE) ×8 IMPLANT
GLOVE SURG SS PI 7.0 STRL IVOR (GLOVE) IMPLANT
GOWN STRL REUS W/ TWL LRG LVL3 (GOWN DISPOSABLE) ×16 IMPLANT
GOWN STRL REUS W/ TWL XL LVL3 (GOWN DISPOSABLE) ×4 IMPLANT
GOWN STRL REUS W/TWL LRG LVL3 (GOWN DISPOSABLE) ×16
GOWN STRL REUS W/TWL XL LVL3 (GOWN DISPOSABLE) ×4
HANDLE SUCTION POOLE (INSTRUMENTS) ×8 IMPLANT
KIT BASIN OR (CUSTOM PROCEDURE TRAY) ×8 IMPLANT
KIT ENCORE 26 ADVANTAGE (KITS) IMPLANT
KIT TURNOVER KIT B (KITS) ×8 IMPLANT
LIGASURE IMPACT 36 18CM CVD LR (INSTRUMENTS) IMPLANT
NS IRRIG 1000ML POUR BTL (IV SOLUTION) ×16 IMPLANT
PACK ENDO MINOR (CUSTOM PROCEDURE TRAY) ×4 IMPLANT
PACK GENERAL/GYN (CUSTOM PROCEDURE TRAY) ×8 IMPLANT
PAD ARMBOARD 7.5X6 YLW CONV (MISCELLANEOUS) ×8 IMPLANT
PENCIL SMOKE EVACUATOR (MISCELLANEOUS) ×8 IMPLANT
RELOAD PROXIMATE 75MM BLUE (ENDOMECHANICALS) ×8 IMPLANT
RELOAD STAPLE 75 3.8 BLU REG (ENDOMECHANICALS) IMPLANT
RETRACTOR WND ALEXIS 25 LRG (MISCELLANEOUS) IMPLANT
RTRCTR WOUND ALEXIS 25CM LRG (MISCELLANEOUS) ×4
SET MICROPUNCTURE 5F STIFF (MISCELLANEOUS) ×4 IMPLANT
SHEATH CATAPULT 6FR 45 (SHEATH) IMPLANT
SHEATH PINNACLE 5F 10CM (SHEATH) ×4 IMPLANT
SHEATH PINNACLE 6F 10CM (SHEATH) IMPLANT
SHEATH PINNACLE 8F 10CM (SHEATH) ×4 IMPLANT
SPECIMEN JAR LARGE (MISCELLANEOUS) IMPLANT
SPONGE ABD ABTHERA ADVANCE (MISCELLANEOUS) IMPLANT
SPONGE T-LAP 18X18 ~~LOC~~+RFID (SPONGE) IMPLANT
STAPLER PROXIMATE 75MM BLUE (STAPLE) IMPLANT
STAPLER VISISTAT 35W (STAPLE) ×8 IMPLANT
STENT VIABAHN 7X19 6FR 80 (Permanent Stent) IMPLANT
STOPCOCK MORSE 400PSI 3WAY (MISCELLANEOUS) ×4 IMPLANT
STRIP CLOSURE SKIN 1/2X4 (GAUZE/BANDAGES/DRESSINGS) ×8 IMPLANT
SUCTION POOLE HANDLE (INSTRUMENTS) ×8
SUT PDS AB 1 TP1 96 (SUTURE) ×16 IMPLANT
SUT SILK 2 0 SH CR/8 (SUTURE) ×8 IMPLANT
SUT SILK 2 0 TIES 10X30 (SUTURE) ×8 IMPLANT
SUT SILK 3 0 SH CR/8 (SUTURE) ×8 IMPLANT
SUT SILK 3 0 TIES 10X30 (SUTURE) ×8 IMPLANT
SUT VIC AB 3-0 SH 18 (SUTURE) IMPLANT
SYR MEDRAD MARK V 150ML (SYRINGE) ×4 IMPLANT
TOWEL GREEN STERILE (TOWEL DISPOSABLE) ×8 IMPLANT
TRAY FOLEY MTR SLVR 16FR STAT (SET/KITS/TRAYS/PACK) IMPLANT
TUBING HIGH PRESSURE 120CM (CONNECTOR) ×4 IMPLANT
TUBING INJECTOR 48 (MISCELLANEOUS) IMPLANT
WATER STERILE IRR 1000ML POUR (IV SOLUTION) ×4 IMPLANT
WIRE AMPLATZ SS-J .035X180CM (WIRE) IMPLANT
WIRE AMPLATZ SS-J .035X260CM (WIRE) IMPLANT
WIRE BENTSON .035X145CM (WIRE) ×4 IMPLANT

## 2022-12-25 NOTE — ED Provider Notes (Signed)
[place] EMERGENCY DEPARTMENT AT [facility] Provider Note   CSN: [id number] Arrival date & time: 12/25/22  1556     History  Chief Complaint  Patient presents with   Shortness of Breath    Peter Oneill is a 82 y.o. male.  Is an 82 year old male present emergency department chief complaint of shortness of breath and abdominal pain.  Patient is ESRD went to dialysis this morning.  No issues while at dialysis for the past several days.  He then had sudden onset abdominal pain and shortness of breath.  EMS reported initial SpO2 of 70%.  Patient reports improvement of his breathing back to baseline after short time on CPAP with EMS.  Seen primarily concerned with abdominal pain.  Diffuse across lower abdomen.  No nausea no vomiting.   Shortness of Breath      Home Medications Prior to Admission medications   Medication Sig Start Date End Date Taking? Authorizing Provider  acetaminophen (TYLENOL) 500 MG tablet Take 1,000 mg by mouth every 6 (six) hours as needed for moderate pain.   Yes [provider]  allopurinol (ZYLOPRIM) 100 MG tablet Take 100 mg by mouth See admin instructions. Take one tablet by mouth on Monday, Wednesdays and Fridays   Yes [provider]  ANORO ELLIPTA 62.5-25 MCG/ACT AEPB Inhale 1 puff into the lungs daily. 09/25/22  Yes Mannam, Praveen, MD  clopidogrel (PLAVIX) 75 MG tablet TAKE 1 TABLET BY MOUTH DAILY 10/28/22  Yes Corky Crafts, MD  ezetimibe (ZETIA) 10 MG tablet Take 10 mg by mouth in the morning.   Yes [provider]  hydrocortisone (ANUSOL-HC) 25 MG suppository Place 25 mg rectally daily as needed for hemorrhoids or anal itching.   Yes [provider]  latanoprost (XALATAN) 0.005 % ophthalmic solution Place 1 drop into both eyes at bedtime.   Yes [provider]  Multiple Vitamin (MULTIVITAMIN) tablet Take 1 tablet by mouth daily.   Yes [provider]  multivitamin (RENA-VIT)  TABS tablet Take 1 tablet by mouth daily. 08/26/22  Yes [provider]  nitroGLYCERIN (NITROSTAT) 0.4 MG SL tablet Place 1 tablet (0.4 mg total) under the tongue every 5 (five) minutes as needed for chest pain. 03/17/16  Yes Corky Crafts, MD  Omega-3 Fatty Acids (FISH OIL) 1000 MG CAPS Take 1,000 mg by mouth daily.   Yes [provider]  rosuvastatin (CRESTOR) 10 MG tablet Take 10 mg by mouth daily.   Yes [provider]  silodosin (RAPAFLO) 4 MG CAPS capsule Take 4 mg by mouth daily with breakfast.   Yes [provider]  VELPHORO 500 MG chewable tablet Chew 500 mg by mouth 3 (three) times daily with meals. 05/15/22  Yes [provider]  amLODipine (NORVASC) 5 MG tablet Take 1 tablet (5 mg total) by mouth daily. Patient not taking: Reported on 12/25/2022 05/19/21   Alberteen Sam, MD      Allergies    Atorvastatin    Review of Systems   Review of Systems  Respiratory:  Positive for shortness of breath.     Physical Exam Updated Vital Signs BP (!) 139/54   Pulse 77   Temp 97.8 F (36.6 C) (Oral)   Resp 16   Ht 5\' 9"  (1.753 m)   Wt 81.2 kg   SpO2 100%   BMI 26.43 kg/m  Physical Exam Vitals and nursing note reviewed.  Constitutional:      General: He is not in  acute distress.    Appearance: He is not toxic-appearing.  HENT:     Head: Normocephalic.     Nose: Nose normal.     Mouth/Throat:     Mouth: Mucous membranes are moist.  Eyes:     Conjunctiva/sclera: Conjunctivae normal.  Cardiovascular:     Rate and Rhythm: Normal rate and regular rhythm.  Pulmonary:     Effort: Pulmonary effort is normal.  Abdominal:     General: Abdomen is flat. There is no distension.     Palpations: Abdomen is soft.     Tenderness: There is abdominal tenderness (mild diffuse). There is no guarding or rebound.  Musculoskeletal:     Right lower leg: No edema.     Left lower leg: No edema.  Skin:    General: Skin is warm.      Capillary Refill: Capillary refill takes less than 2 seconds.  Neurological:     Mental Status: He is alert and oriented to person, place, and time.  Psychiatric:        Mood and Affect: Mood normal.        Behavior: Behavior normal.     ED Results / Procedures / Treatments   Labs (all labs ordered are listed, but only abnormal results are displayed) Labs Reviewed  CBC - Abnormal; Notable for the following components:      Result Value   RBC 3.53 (*)    Hemoglobin 11.2 (*)    HCT 35.3 (*)    RDW 15.6 (*)    All other components within normal limits  COMPREHENSIVE METABOLIC PANEL - Abnormal; Notable for the following components:   Chloride 95 (*)    Glucose, Bld 179 (*)    BUN 26 (*)    Creatinine, Ser 3.71 (*)    Total Protein 8.3 (*)    Albumin 2.8 (*)    AST 48 (*)    Alkaline Phosphatase 218 (*)    GFR, Estimated 16 (*)    All other components within normal limits  BRAIN NATRIURETIC PEPTIDE - Abnormal; Notable for the following components:   B Natriuretic Peptide 1,345.2 (*)    All other components within normal limits  TROPONIN I (HIGH SENSITIVITY) - Abnormal; Notable for the following components:   Troponin I (High Sensitivity) 80 (*)    All other components within normal limits  TROPONIN I (HIGH SENSITIVITY) - Abnormal; Notable for the following components:   Troponin I (High Sensitivity) 74 (*)    All other components within normal limits  LIPASE, BLOOD  LACTIC ACID, PLASMA  LACTIC ACID, PLASMA    EKG None  Radiology CT Angio Abd/Pel W and/or Wo Contrast  Result Date: 12/25/2022 CLINICAL DATA:  Chest and abdomen pain. Acute mesenteric ischemia. Pulmonary embolus suspected with high probability. EXAM: CT ANGIOGRAPHY CHEST, ABDOMEN AND PELVIS TECHNIQUE: Multidetector CT imaging through the chest, abdomen and pelvis was performed using the standard protocol during bolus administration of intravenous contrast. Multiplanar reconstructed images and MIPs were  obtained and reviewed to evaluate the vascular anatomy. RADIATION DOSE REDUCTION: This exam was performed according to the departmental dose-optimization program which includes automated exposure control, adjustment of the mA and/or kV according to patient size and/or use of iterative reconstruction technique. CONTRAST:  80mL OMNIPAQUE IOHEXOL 350 MG/ML SOLN COMPARISON:  CT chest 05/29/2022.  Abdominal radiograph 05/16/2021 FINDINGS: CTA CHEST FINDINGS Cardiovascular: Technically adequate study with good opacification of the central and segmental pulmonary arteries. Mild motion artifact. No focal filling defects are  identified. No evidence of significant pulmonary embolus. Cardiac enlargement. Reflux of contrast material into the IVC may indicate right heart failure. No pericardial effusions. Postoperative changes consistent with coronary bypass. Normal caliber thoracic aorta. No aortic dissection. Calcification of the aorta. Mediastinum/Nodes: Thyroid gland is unremarkable. Esophagus is decompressed. No significant lymphadenopathy. Lungs/Pleura: Multiple large calcified pleural plaques bilaterally with areas of pleural thickening. Trace right and small left pleural effusions with mild basilar atelectasis. Musculoskeletal: Degenerative changes in the spine and shoulders. Sternotomy wires. No acute bony abnormalities. Review of the MIP images confirms the above findings. CTA ABDOMEN AND PELVIS FINDINGS VASCULAR Aorta: Diffuse calcification of the aorta. No aneurysm, dissection, or significant stenosis. 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 SMA: Focal stenosis of the origin of the superior mesenteric artery, likely representing about 70% diameter reduction. Renals: Single renal arteries bilaterally are diffusely diminutive with symmetrical but delayed nephrograms bilaterally. This likely represents moderate to  significant renal artery stenosis. IMA: Inferior mesenteric artery is prominent without evidence of significant stenosis or dissection. Inflow: Patent without evidence of aneurysm, dissection, vasculitis or significant stenosis. Diffuse calcification. Veins: No obvious venous abnormality within the limitations of this arterial phase study. Review of the MIP images confirms the above findings. NON-VASCULAR Hepatobiliary: Cholelithiasis without inflammatory change. No focal liver lesions. No bile duct dilatation. Pancreas: Unremarkable. No pancreatic ductal dilatation or surrounding inflammatory changes. Spleen: Normal in size without focal abnormality. Adrenals/Urinary Tract: No adrenal gland nodules. Bilateral renal parenchymal atrophy. No hydronephrosis or hydroureter. Bladder is decompressed. Stomach/Bowel: Stomach, small bowel, and colon are not abnormally distended. There is pneumatosis of the cecum with portal venous gas in the right lower quadrant likely representing colonic ischemia. Sigmoid colonic diverticulosis without evidence of acute diverticulitis. Appendix is not identified. Lymphatic: No significant lymphadenopathy. Reproductive: Prostate is unremarkable. Other: Small amount of free fluid in the upper abdomen around the liver, likely ascites. No free air. Abdominal wall musculature demonstrates diffuse fatty atrophy but appears intact. Musculoskeletal: Degenerative changes in the spine and hips. Postoperative lower lumbar laminectomies. No acute bony abnormalities. Review of the MIP images confirms the above findings. IMPRESSION: 1. No evidence of significant pulmonary embolus. 2. Multiple calcified pleural plaques with areas of pleural thickening. This may indicate prior asbestos exposure or infection. 3. Small bilateral pleural effusions with basilar atelectasis. 4. Diffuse aortic atherosclerosis. 5. 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 6. 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 7. Cecal pneumatosis with portal venous gas in the right lower quadrant likely indicates cecal ischemia. 8. Bilateral renal artery stenosis with delayed nephrograms and parenchymal atrophy. 9. Cholelithiasis without evidence of acute cholecystitis. 10. Small amount of upper abdominal ascites.  No free air. Critical Value/emergent results were called by telephone at the time of interpretation on 12/25/2022 at 8:31 pm to provider [facility] , who verbally acknowledged these results. Electronically Signed   By: Burman Nieves M.D.   On: 12/25/2022 20:37   CT Angio Chest PE W and/or Wo Contrast  Result Date: 12/25/2022 CLINICAL DATA:  Chest and abdomen pain. Acute mesenteric ischemia. Pulmonary embolus suspected with high probability. EXAM: CT ANGIOGRAPHY CHEST, ABDOMEN AND PELVIS TECHNIQUE: Multidetector CT imaging through the chest, abdomen and pelvis was performed using the standard protocol during bolus administration of intravenous contrast. Multiplanar reconstructed images and MIPs were obtained  and reviewed to evaluate the vascular anatomy. RADIATION DOSE REDUCTION: This exam was performed according to the departmental dose-optimization program which includes automated exposure control, adjustment of the mA and/or kV according to patient size and/or use of iterative reconstruction technique. CONTRAST:  80mL OMNIPAQUE IOHEXOL 350 MG/ML SOLN COMPARISON:  CT chest 05/29/2022.  Abdominal radiograph 05/16/2021 FINDINGS: CTA CHEST FINDINGS Cardiovascular: Technically adequate study with good opacification of the central and segmental pulmonary arteries. Mild motion artifact. No focal filling defects are identified. No evidence of significant pulmonary embolus. Cardiac enlargement. Reflux of contrast material into the IVC may indicate right heart failure. No pericardial effusions.  Postoperative changes consistent with coronary bypass. Normal caliber thoracic aorta. No aortic dissection. Calcification of the aorta. Mediastinum/Nodes: Thyroid gland is unremarkable. Esophagus is decompressed. No significant lymphadenopathy. Lungs/Pleura: Multiple large calcified pleural plaques bilaterally with areas of pleural thickening. Trace right and small left pleural effusions with mild basilar atelectasis. Musculoskeletal: Degenerative changes in the spine and shoulders. Sternotomy wires. No acute bony abnormalities. Review of the MIP images confirms the above findings. CTA ABDOMEN AND PELVIS FINDINGS VASCULAR Aorta: Diffuse calcification of the aorta. No aneurysm, dissection, or significant stenosis. 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 SMA: Focal stenosis of the origin of the superior mesenteric artery, likely representing about 70% diameter reduction. Renals: Single renal arteries bilaterally are diffusely diminutive with symmetrical but delayed nephrograms bilaterally. This likely represents moderate to significant renal artery stenosis. IMA: Inferior mesenteric artery is prominent without evidence of significant stenosis or dissection. Inflow: Patent without evidence of aneurysm, dissection, vasculitis or significant stenosis. Diffuse calcification. Veins: No obvious venous abnormality within the limitations of this arterial phase study. Review of the MIP images confirms the above findings. NON-VASCULAR Hepatobiliary: Cholelithiasis without inflammatory change. No focal liver lesions. No bile duct dilatation. Pancreas: Unremarkable. No pancreatic ductal dilatation or surrounding inflammatory changes. Spleen: Normal in size without focal abnormality. Adrenals/Urinary Tract: No adrenal gland nodules. Bilateral renal parenchymal atrophy. No hydronephrosis or hydroureter. Bladder is decompressed.  Stomach/Bowel: Stomach, small bowel, and colon are not abnormally distended. There is pneumatosis of the cecum with portal venous gas in the right lower quadrant likely representing colonic ischemia. Sigmoid colonic diverticulosis without evidence of acute diverticulitis. Appendix is not identified. Lymphatic: No significant lymphadenopathy. Reproductive: Prostate is unremarkable. Other: Small amount of free fluid in the upper abdomen around the liver, likely ascites. No free air. Abdominal wall musculature demonstrates diffuse fatty atrophy but appears intact. Musculoskeletal: Degenerative changes in the spine and hips. Postoperative lower lumbar laminectomies. No acute bony abnormalities. Review of the MIP images confirms the above findings. IMPRESSION: 1. No evidence of significant pulmonary embolus. 2. Multiple calcified pleural plaques with areas of pleural thickening. This may indicate prior asbestos exposure or infection. 3. Small bilateral pleural effusions with basilar atelectasis. 4. Diffuse aortic atherosclerosis. 5. 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 6. 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 7. Cecal pneumatosis with portal venous gas in the right lower quadrant likely indicates cecal ischemia. 8. Bilateral renal artery stenosis with delayed nephrograms and parenchymal atrophy. 9. Cholelithiasis without evidence of acute cholecystitis. 10. Small amount of upper abdominal ascites.  No free air. Critical Value/emergent results were called by telephone at the time of interpretation on 12/25/2022 at 8:31 pm to provider Hildreth Robart , who  verbally acknowledged these results. Electronically Signed   By: Burman Nieves M.D.   On: 12/25/2022 20:37   DG Chest Portable 1 View  Result Date: 12/25/2022 CLINICAL DATA:  Shortness of breath. EXAM: PORTABLE CHEST 1 VIEW  COMPARISON:  May 13, 2021 FINDINGS: Multiple sternal wires and vascular clips are seen. The cardiac silhouette is mildly enlarged and unchanged in size. Mild, diffuse, chronic appearing increased lung markings are seen with mild areas of scarring and/or atelectasis is noted within the bilateral lung bases. There is a small, stable left pleural effusion. Extensive areas of pleural based calcification are again seen, bilaterally. No pneumothorax is identified. Multilevel degenerative changes seen throughout the thoracic spine. IMPRESSION: 1. Evidence of prior median sternotomy/CABG. 2. Chronic appearing increased lung markings with mild bibasilar scarring and/or atelectasis. 3. Small, stable left pleural effusion. 4. Extensive pleural based calcifications which may represent sequelae associated with asbestos exposure. Electronically Signed   By: Aram Candela M.D.   On: 12/25/2022 17:44    Procedures .Critical Care  Performed by: Coral Spikes, DO Authorized by: Coral Spikes, DO   Critical care provider statement:    Critical care time (minutes):  45   Critical care was necessary to treat or prevent imminent or life-threatening deterioration of the following conditions:  Circulatory failure, sepsis, shock and cardiac failure   Critical care was time spent personally by me on the following activities:  Development of treatment plan with patient or surrogate, discussions with consultants, evaluation of patient's response to treatment, examination of patient, obtaining history from patient or surrogate, ordering and performing treatments and interventions, ordering and review of laboratory studies, ordering and review of radiographic studies, pulse oximetry, re-evaluation of patient's condition and review of old charts   Care discussed with: admitting provider       Medications Ordered in ED Medications  piperacillin-tazobactam (ZOSYN) IVPB 2.25 g (has no administration in time range)   HYDROmorphone (DILAUDID) injection 0.5 mg (0.5 mg Intravenous Given 12/25/22 1827)  iohexol (OMNIPAQUE) 350 MG/ML injection 80 mL (80 mLs Intravenous Contrast Given 12/25/22 1855)  HYDROmorphone (DILAUDID) injection 1 mg (1 mg Intravenous Given 12/25/22 2040)  sodium chloride 0.9 % bolus 1,000 mL (1,000 mLs Intravenous New Bag/Given 12/25/22 2042)    ED Course/ Medical Decision Making/ A&P Clinical Course as of 12/25/22 2113  Thu Dec 25, 2022  1822 DG Chest Portable 1 View IMPRESSION: 1. Evidence of prior median sternotomy/CABG. 2. Chronic appearing increased lung markings with mild bibasilar scarring and/or atelectasis. 3. Small, stable left pleural effusion. 4. Extensive pleural based calcifications which may represent sequelae associated with asbestos exposure.   [TY]  2036 Radioloy called;   No PE. Effusions.   Celiac completely occluded. SMA stenosis. Cecal ischemia on scan. Will consult surgery. Emperic antibiotics ordered.  [TY]  2053 Consulted vascular surgery and general surgery.  They will see and evaluate patient. Plan for OR this evening.  [TY]    Clinical Course User Index [TY] Coral Spikes, DO                                 Medical Decision Making Is a 82 year old male presenting to the emergency department with shortness of breath and abdominal pain.  Complex past medical history to include hypertension, ESRD on dialysis, CAD, and asbestosis on home oxygen.  He is afebrile nontachycardic, and has been hemodynamically stable here in the emergency department.  EMS  reported patient hypoxic to 70%, placed on CPAP.  On arrival, was taken off and placed on nasal cannula and has been maintained his oxygen saturation in the high 90s to 100%.  Patient did display some pain out of proportion on physical exam, but did not appear to be too uncomfortable.  Pain increased while in the emergency department awaiting CT scans.  Appears he has mesenteric ischemia with occlusion of  the celiac artery.  Other labs showed no leukocytosis fever tachycardia.  Initial lactate not elevated at 1.4.  Repeat is pending.  Does have elevated troponin, but EKG reassuring.  No significant metabolic derangements that would be unexpected for ESRD.  Patient given IV fluids, Zosyn ordered.  Vascular surgery and general surgery consulted.  Discussed case at bedside with team.  Plan to take patient to the OR this evening.  Family also at bedside and were updated on results.  They note that patient is on Plavix currently.  Amount and/or Complexity of Data Reviewed Labs: ordered. Radiology: ordered. Decision-making details documented in ED Course.  Risk Prescription drug management. Decision regarding hospitalization.          Final Clinical Impression(s) / ED Diagnoses Final diagnoses:  Mesenteric ischemia [facility])    Rx / DC Orders ED Discharge Orders     None         Coral Spikes, DO 12/25/22 2113

## 2022-12-25 NOTE — Consult Note (Signed)
Reason for Consult/Chief Complaint: Mesenteric Ischemia Consultant: Donata Duff, MD  Peter Oneill is an 82 y.o. male with acute mesenteric ischemia  HPI: Patient presented to the ED earlier today due to shortness of breath and abdominal pain. Patient states he had severe onset abdominal pain earlier today. His pain has improved with IV narcotics. 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 Pneumatosis of his cecum concerning for ischemia. No leukocytosis and lactate is within normal limits. Afebrile. No nausea or emesis. Evaluated him with Dr. Lenell Antu in ED and will plan for revascularization with exploratory laparotomy to evaluate his bowel. Daughter and wife at bedside and plan of care discussed.  Past Medical History:  Diagnosis Date   Asbestosis (HCC)    Contraindication to percutaneous coronary intervention (PCI)    Coronary artery disease    Dyslipidemia    Dyspnea    History of anemia    Hypercholesterolemia    Hypertension    Ileus (HCC)    Pleural effusion on left    Pneumonia    Renal failure    Sleep apnea     Past Surgical History:  Procedure Laterality Date   A/V FISTULAGRAM Left 07/17/2021   Procedure: A/V Fistulagram;  Surgeon: Victorino Sparrow, MD;  Location: [facility] INVASIVE CV LAB;  Service: Cardiovascular;  Laterality: Left;   AV FISTULA PLACEMENT Left 04/23/2021   Procedure: LEFT ARM  FISTULA CREATION;  Surgeon: Victorino Sparrow, MD;  Location: [facility] OR;  Service: Vascular;  Laterality: Left;  PERIPHERAL NERVE BLOCK   BACK SURGERY     CARDIAC CATHETERIZATION     CAROTID ENDARTERECTOMY     CARPAL TUNNEL RELEASE     CORONARY ARTERY BYPASS GRAFT     HERNIA REPAIR     PERCUTANEOUS CORONARY STENT INTERVENTION (PCI-S)     multiple   STERNOTOMY      Family History  Problem Relation Age of Onset   Heart disease Father      Social History:  reports that he quit smoking about 46 years ago. His smoking use included cigarettes. He started smoking about 65 years ago. He has a 4.8 pack-year smoking history. He has never used smokeless tobacco. He reports current alcohol use. He reports that he does not use drugs.  Allergies:  Allergies  Allergen Reactions   Atorvastatin Other (See Comments)    Muscle aches    Medications: I have reviewed the patient's current medications.  Results for orders placed or performed during the hospital encounter of 12/25/22 (from the past 48 hour(s))  CBC     Status: Abnormal   Collection Time: 12/25/22  4:03 PM  Result Value Ref Range   WBC 6.3 4.0 - 10.5 K/uL   RBC 3.53 (L) 4.22 - 5.81 MIL/uL   Hemoglobin 11.2 (L) 13.0 - 17.0 g/dL   HCT 29.5 (L) 62.1 - 30.8 %   MCV 100.0 80.0 - 100.0 fL   MCH 31.7 26.0 - 34.0 pg   MCHC 31.7 30.0 - 36.0 g/dL   RDW 65.7 (H) 84.6 - 96.2 %   Platelets 233 150 - 400 K/uL   nRBC 0.0 0.0 - 0.2 %    Comment: Performed at [facility], [address] [place], [place] [address]  Comprehensive metabolic panel     Status: Abnormal   Collection Time: 12/25/22  4:03 PM  Result Value Ref Range   Sodium 138 135 - 145 mmol/L   Potassium 3.9 3.5 - 5.1 mmol/L   Chloride 95 (L) 98 - 111 mmol/L   CO2 30 22 - 32 mmol/L   Glucose, Bld 179 (H) 70 - 99 mg/dL    Comment: Glucose reference range applies only to samples taken after fasting for at least 8 hours.   BUN 26 (H) 8 - 23 mg/dL   Creatinine, Ser 1.61 (H) 0.61 - 1.24 mg/dL   Calcium 8.9 8.9 - 09.6 mg/dL   Total Protein 8.3 (H) 6.5 - 8.1 g/dL   Albumin 2.8 (L) 3.5 - 5.0 g/dL   AST 48 (H) 15 - 41 U/L   ALT 40 0 - 44 U/L   Alkaline Phosphatase 218 (H) 38 - 126 U/L   Total Bilirubin 0.9 0.3 - 1.2 mg/dL   GFR, Estimated 16 (L) >60 mL/min    Comment: (NOTE) Calculated using the CKD-EPI Creatinine Equation (2021)    Anion gap 13 5 - 15    Comment: Performed at [facility], [address], [place], [place] [address]  Lipase, blood     Status: None   Collection Time: 12/25/22  4:03 PM  Result Value Ref Range   Lipase 41 11 - 51 U/L    Comment: Performed at [facility], [address] [place], [place] [address]  Troponin I (High Sensitivity)     Status: Abnormal   Collection Time: 12/25/22  4:03 PM  Result Value Ref Range   Troponin I (High Sensitivity) 80 (H) <18 ng/L    Comment: (NOTE) Elevated high sensitivity troponin I (hsTnI) values and significant  changes across serial measurements may suggest ACS but many other  chronic and acute conditions are known to elevate hsTnI results.  Refer to the "Links" section for chest pain algorithms and additional  guidance. Performed at [facility], [address] [place], [place] [address]   Brain natriuretic peptide     Status: Abnormal   Collection Time: 12/25/22  4:03 PM  Result Value Ref Range   B Natriuretic Peptide 1,345.2 (H) 0.0 - 100.0 pg/mL    Comment: Performed at [facility], [address] [place], [place] [address]  Lactic acid, plasma     Status: None   Collection Time: 12/25/22  6:15 PM  Result Value Ref Range   Lactic Acid, Venous 1.4 0.5 - 1.9 mmol/L    Comment: Performed at [facility], [address], [place], [place] [address]  Troponin I (High Sensitivity)     Status: Abnormal   Collection Time: 12/25/22  6:15 PM  Result Value Ref Range   Troponin I (High Sensitivity) 74 (H) <18 ng/L    Comment: (NOTE) Elevated high sensitivity troponin I (hsTnI) values and significant  changes across serial measurements may suggest ACS but many other  chronic and acute conditions are known to elevate hsTnI results.  Refer to the "Links" section for chest pain algorithms and additional  guidance. Performed at [facility], [address] [place], [place] [address]     CT Angio Abd/Pel W and/or Wo Contrast  Result Date: 12/25/2022 CLINICAL DATA:  Chest and abdomen pain.  Acute mesenteric ischemia. Pulmonary embolus suspected with high probability. EXAM: CT ANGIOGRAPHY CHEST, ABDOMEN AND PELVIS TECHNIQUE: Multidetector CT imaging through the chest, abdomen and pelvis was performed using the standard protocol during bolus administration of  intravenous contrast. Multiplanar reconstructed images and MIPs were obtained and reviewed to evaluate the vascular anatomy. RADIATION DOSE REDUCTION: This exam was performed according to the departmental dose-optimization program which includes automated exposure control, adjustment of the mA and/or kV according to patient size and/or use of iterative reconstruction technique. CONTRAST:  80mL OMNIPAQUE IOHEXOL 350 MG/ML SOLN COMPARISON:  CT chest 05/29/2022.  Abdominal radiograph 05/16/2021 FINDINGS: CTA CHEST FINDINGS Cardiovascular: Technically adequate study with good opacification of the central and segmental pulmonary arteries. Mild motion artifact. No focal filling defects are identified. No evidence of significant pulmonary embolus. Cardiac enlargement. Reflux of contrast material into the IVC may indicate right heart failure. No pericardial effusions. Postoperative changes consistent with coronary bypass. Normal caliber thoracic aorta. No aortic dissection. Calcification of the aorta. Mediastinum/Nodes: Thyroid gland is unremarkable. Esophagus is decompressed. No significant lymphadenopathy. Lungs/Pleura: Multiple large calcified pleural plaques bilaterally with areas of pleural thickening. Trace right and small left pleural effusions with mild basilar atelectasis. Musculoskeletal: Degenerative changes in the spine and shoulders. Sternotomy wires. No acute bony abnormalities. Review of the MIP images confirms the above findings. CTA ABDOMEN AND PELVIS FINDINGS VASCULAR Aorta: Diffuse calcification of the aorta. No aneurysm, dissection, or significant stenosis. Celiac: The origin of the celiac axis is occluded with collateralized flow  demonstrated to the splenic, hepatic, and right gastric arteries. SMA: Focal stenosis of the origin of the superior mesenteric artery, likely representing about 70% diameter reduction. Renals: Single renal arteries bilaterally are diffusely diminutive with symmetrical but delayed nephrograms bilaterally. This likely represents moderate to significant renal artery stenosis. IMA: Inferior mesenteric artery is prominent without evidence of significant stenosis or dissection. Inflow: Patent without evidence of aneurysm, dissection, vasculitis or significant stenosis. Diffuse calcification. Veins: No obvious venous abnormality within the limitations of this arterial phase study. Review of the MIP images confirms the above findings. NON-VASCULAR Hepatobiliary: Cholelithiasis without inflammatory change. No focal liver lesions. No bile duct dilatation. Pancreas: Unremarkable. No pancreatic ductal dilatation or surrounding inflammatory changes. Spleen: Normal in size without focal abnormality. Adrenals/Urinary Tract: No adrenal gland nodules. Bilateral renal parenchymal atrophy. No hydronephrosis or hydroureter. Bladder is decompressed. Stomach/Bowel: Stomach, small bowel, and colon are not abnormally distended. There is pneumatosis of the cecum with portal venous gas in the right lower quadrant likely representing colonic ischemia. Sigmoid colonic diverticulosis without evidence of acute diverticulitis. Appendix is not identified. Lymphatic: No significant lymphadenopathy. Reproductive: Prostate is unremarkable. Other: Small amount of free fluid in the upper abdomen around the liver, likely ascites. No free air. Abdominal wall musculature demonstrates diffuse fatty atrophy but appears intact. Musculoskeletal: Degenerative changes in the spine and hips. Postoperative lower lumbar laminectomies. No acute bony abnormalities. Review of the MIP images confirms the above findings. IMPRESSION: 1. No evidence of significant  pulmonary embolus. 2. Multiple calcified pleural plaques with areas of pleural thickening. This may indicate prior asbestos exposure or infection. 3. Small bilateral pleural effusions with basilar atelectasis. 4. Diffuse aortic atherosclerosis. 5. 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 6. 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 7. Cecal pneumatosis with portal venous gas in the right lower quadrant likely indicates cecal ischemia. 8. Bilateral renal artery stenosis with delayed nephrograms and parenchymal atrophy. 9. Cholelithiasis without evidence of acute cholecystitis. 10. Small amount of upper abdominal ascites.  No free air. Critical Value/emergent results were called by telephone at the time of interpretation on 12/25/2022  at 8:31 pm to provider [facility] , who verbally acknowledged these results. Electronically Signed   By: Burman Nieves M.D.   On: 12/25/2022 20:37   CT Angio Chest PE W and/or Wo Contrast  Result Date: 12/25/2022 CLINICAL DATA:  Chest and abdomen pain. Acute mesenteric ischemia. Pulmonary embolus suspected with high probability. EXAM: CT ANGIOGRAPHY CHEST, ABDOMEN AND PELVIS TECHNIQUE: Multidetector CT imaging through the chest, abdomen and pelvis was performed using the standard protocol during bolus administration of intravenous contrast. Multiplanar reconstructed images and MIPs were obtained and reviewed to evaluate the vascular anatomy. RADIATION DOSE REDUCTION: This exam was performed according to the departmental dose-optimization program which includes automated exposure control, adjustment of the mA and/or kV according to patient size and/or use of iterative reconstruction technique. CONTRAST:  80mL OMNIPAQUE IOHEXOL 350 MG/ML SOLN COMPARISON:  CT chest 05/29/2022.  Abdominal radiograph 05/16/2021 FINDINGS: CTA CHEST FINDINGS  Cardiovascular: Technically adequate study with good opacification of the central and segmental pulmonary arteries. Mild motion artifact. No focal filling defects are identified. No evidence of significant pulmonary embolus. Cardiac enlargement. Reflux of contrast material into the IVC may indicate right heart failure. No pericardial effusions. Postoperative changes consistent with coronary bypass. Normal caliber thoracic aorta. No aortic dissection. Calcification of the aorta. Mediastinum/Nodes: Thyroid gland is unremarkable. Esophagus is decompressed. No significant lymphadenopathy. Lungs/Pleura: Multiple large calcified pleural plaques bilaterally with areas of pleural thickening. Trace right and small left pleural effusions with mild basilar atelectasis. Musculoskeletal: Degenerative changes in the spine and shoulders. Sternotomy wires. No acute bony abnormalities. Review of the MIP images confirms the above findings. CTA ABDOMEN AND PELVIS FINDINGS VASCULAR Aorta: Diffuse calcification of the aorta. No aneurysm, dissection, or significant stenosis. 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 SMA: Focal stenosis of the origin of the superior mesenteric artery, likely representing about 70% diameter reduction. Renals: Single renal arteries bilaterally are diffusely diminutive with symmetrical but delayed nephrograms bilaterally. This likely represents moderate to significant renal artery stenosis. IMA: Inferior mesenteric artery is prominent without evidence of significant stenosis or dissection. Inflow: Patent without evidence of aneurysm, dissection, vasculitis or significant stenosis. Diffuse calcification. Veins: No obvious venous abnormality within the limitations of this arterial phase study. Review of the MIP images confirms the above findings. NON-VASCULAR Hepatobiliary: Cholelithiasis without inflammatory  change. No focal liver lesions. No bile duct dilatation. Pancreas: Unremarkable. No pancreatic ductal dilatation or surrounding inflammatory changes. Spleen: Normal in size without focal abnormality. Adrenals/Urinary Tract: No adrenal gland nodules. Bilateral renal parenchymal atrophy. No hydronephrosis or hydroureter. Bladder is decompressed. Stomach/Bowel: Stomach, small bowel, and colon are not abnormally distended. There is pneumatosis of the cecum with portal venous gas in the right lower quadrant likely representing colonic ischemia. Sigmoid colonic diverticulosis without evidence of acute diverticulitis. Appendix is not identified. Lymphatic: No significant lymphadenopathy. Reproductive: Prostate is unremarkable. Other: Small amount of free fluid in the upper abdomen around the liver, likely ascites. No free air. Abdominal wall musculature demonstrates diffuse fatty atrophy but appears intact. Musculoskeletal: Degenerative changes in the spine and hips. Postoperative lower lumbar laminectomies. No acute bony abnormalities. Review of the MIP images confirms the above findings. IMPRESSION: 1. No evidence of significant pulmonary embolus. 2. Multiple calcified pleural plaques with areas of pleural thickening. This may indicate prior asbestos exposure or infection. 3. Small bilateral pleural effusions with basilar atelectasis. 4. Diffuse aortic atherosclerosis. 5. Occlusion of the origin of the celiac  axis with reconstituted flow via mesenteric artery collaterals. 6. 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 7. Cecal pneumatosis with portal venous gas in the right lower quadrant likely indicates cecal ischemia. 8. Bilateral renal artery stenosis with delayed nephrograms and parenchymal atrophy. 9. Cholelithiasis without evidence of acute cholecystitis. 10. Small amount of upper abdominal ascites.  No free air. Critical Value/emergent  results were called by telephone at the time of interpretation on 12/25/2022 at 8:31 pm to provider [facility] , who verbally acknowledged these results. Electronically Signed   By: Burman Nieves M.D.   On: 12/25/2022 20:37   DG Chest Portable 1 View  Result Date: 12/25/2022 CLINICAL DATA:  Shortness of breath. EXAM: PORTABLE CHEST 1 VIEW COMPARISON:  May 13, 2021 FINDINGS: Multiple sternal wires and vascular clips are seen. The cardiac silhouette is mildly enlarged and unchanged in size. Mild, diffuse, chronic appearing increased lung markings are seen with mild areas of scarring and/or atelectasis is noted within the bilateral lung bases. There is a small, stable left pleural effusion. Extensive areas of pleural based calcification are again seen, bilaterally. No pneumothorax is identified. Multilevel degenerative changes seen throughout the thoracic spine. IMPRESSION: 1. Evidence of prior median sternotomy/CABG. 2. Chronic appearing increased lung markings with mild bibasilar scarring and/or atelectasis. 3. Small, stable left pleural effusion. 4. Extensive pleural based calcifications which may represent sequelae associated with asbestos exposure. Electronically Signed   By: Aram Candela M.D.   On: 12/25/2022 17:44    ROS 10 point review of systems is negative except as listed above in HPI.   Physical Exam Blood pressure (!) 139/54, pulse 77, temperature 97.8 F (36.6 C), temperature source Oral, resp. rate 16, height 5\' 9"  (1.753 m), weight 81.2 kg, SpO2 100%. Constitutional: elderly, frail HEENT: pupils equal, round, reactive to light, moist conjunctiva, external inspection of ears and nose normal, hearing intact Oropharynx: normal oropharyngeal mucosa, normal dentition Neck: no thyromegaly, trachea midline Chest: normal work of breathing on nasal cannula Abdomen: soft, tender to palpation diffusely but non peritonitic GU: Deferred Rectal: Deferred Extremities: thrill in LUE  AVF Skin: warm, dry, no rashes Psych: Appropriate    Assessment/Plan: 82 yo M with acute mesenteric ischemia  - Proceed to OR for revascularization and exploratory laparotomy with possible bowel resection FEN - NPO DVT - SCDs, will be anticoagulated for revascularization Dispo - ICU, appreciate medicine team's admission    I personally reviewed images of CTA showing mesenteric ischemia and cecal pneumotosis. I personally discussed this patient's care with Dr. Lenell Antu of vascular surgery about revascularization and exploratory laparotomy. Additionally discussed with ED provider, Dr. Maple Hudson and recommended medical admission given comorbidities. I reviewed recent lab values, vitals, and notes.  This care required high  level of medical decision making.      Marikay Alar, MD [facility]

## 2022-12-25 NOTE — ED Triage Notes (Signed)
Pt BIB [facility] with reports of [facility]. EMS reports pt had initial O2 of 70%. Pt was placed on CPAP. Pt 100% on CPAP at arrival. Pt C/O abdominal pain. Had dialysis this morning.

## 2022-12-25 NOTE — H&P (Incomplete)
NAME:  Peter Oneill, MRN:  [id number], DOB:  09-06-1940, LOS: 0 ADMISSION DATE:  12/25/2022, CONSULTATION DATE:  12/25/22 REFERRING MD:  EDP, CHIEF COMPLAINT:  Abdominal Pain   History of Present Illness:  Peter Oneill is a 82 y.o. M with PMH if ESRD, Asbestosis, CAD s/p CABG, HTN, HL, OSA and emphysema followed by Dr. [name] who presented to the ED with acutely worsening abdominal pain that began the day of admission.  He underwent dialysis in the morning and then felt short of breath followed by abdominal discomfort.  His initial oxygen saturation was 70% on EMS arrival and his work-up in the ED included a CT Abd/pelvis concerning for acute mesenteric ischemia secondary to occlusion of the celiac artery and significant stenosis of the superior mesenteric artery and small inferior mesenteric artery.  His labs showed a lactic acid of 1.4, WBC 6.3, creatinine 3.7, BNP >1300, normal Hgb.  He was taken emergently to the OR for urgent revascularization and SMA stenting along with ex-lap.  PCCM asked to admit  Pertinent  Medical History   has a past medical history of Asbestosis (HCC), Contraindication to percutaneous coronary intervention (PCI), Coronary artery disease, Dyslipidemia, Dyspnea, History of anemia, Hypercholesterolemia, Hypertension, Ileus (HCC), Pleural effusion on left, Pneumonia, Renal failure, and Sleep apnea.   Significant Hospital Events: Including procedures, antibiotic start and stop dates in addition to other pertinent events   10/31 presented with mesenteric ischemia, occlusion of the celiac and significant stenosis of the SMA and small inferior mesenteric artery  Interim History / Subjective:    Objective   Blood pressure (!) 134/50, pulse 68, temperature 97.8 F (36.6 C), temperature source Oral, resp. rate 18, height 5\' 9"  (1.753 m), weight 81.2 kg, SpO2 99%.        Intake/Output Summary (Last 24 hours) at 12/25/2022 2235 Last data filed at 12/25/2022  2150 Gross per 24 hour  Intake 1000 ml  Output --  Net 1000 ml   Filed Weights   12/25/22 1807  Weight: 81.2 kg    General:   HEENT: MM pink/moist Neuro:  CV: s1s2 ***, no m/r/g PULM:  *** GI: soft, bsx4 active  Extremities: warm/dry, *** edema  Skin: no rashes or lesions  Resolved Hospital Problem list     Assessment & Plan:    Acute Mesenteric ischemia, occlusion of the celiac artery and significant stenosis of the superior mesenteric artery and small inferior mesenteric artery -plan per surgery -NPO   Acute Hypoxic Respiratory Failure      ESRD -dialyzed 10/30,     History of asbestosis, emphysema, OSA -on Anoro, follows with Dr, [name]   Best Practice (right click and "Reselect all SmartList Selections" daily)   Diet/type: {diet type:25684} DVT prophylaxis: {anticoagulation (Optional):25687} GI prophylaxis: {BM:84132} Lines: {Central Venous Access:25771} Foley:  {Central Venous Access:25691} Code Status:  {Code Status:26939} Last date of multidisciplinary goals of care discussion [***]  Labs   CBC: Recent Labs  Lab 12/25/22 1603  WBC 6.3  HGB 11.2*  HCT 35.3*  MCV 100.0  PLT 233    Basic Metabolic Panel: Recent Labs  Lab 12/25/22 1603  NA 138  K 3.9  CL 95*  CO2 30  GLUCOSE 179*  BUN 26*  CREATININE 3.71*  CALCIUM 8.9   GFR: Estimated Creatinine Clearance: 15.6 mL/min (A) (by C-G formula based on SCr of 3.71 mg/dL (H)). Recent Labs  Lab 12/25/22 1603 12/25/22 1815  WBC 6.3  --   LATICACIDVEN  --  1.4  Liver Function Tests: Recent Labs  Lab 12/25/22 1603  AST 48*  ALT 40  ALKPHOS 218*  BILITOT 0.9  PROT 8.3*  ALBUMIN 2.8*   Recent Labs  Lab 12/25/22 1603  LIPASE 41   No results for input(s): "AMMONIA" in the last 168 hours.  ABG    Component Value Date/Time   HCO3 14.2 (L) 05/13/2021 1040   TCO2 29 07/17/2021 1159   ACIDBASEDEF 10.0 (H) 05/13/2021 1040   O2SAT 100 05/13/2021 1040     Coagulation  Profile: No results for input(s): "INR", "PROTIME" in the last 168 hours.  Cardiac Enzymes: No results for input(s): "CKTOTAL", "CKMB", "CKMBINDEX", "TROPONINI" in the last 168 hours.  HbA1C: No results found for: "HGBA1C"  CBG: No results for input(s): "GLUCAP" in the last 168 hours.  Review of Systems:   ***  Past Medical History:  He,  has a past medical history of Asbestosis (HCC), Contraindication to percutaneous coronary intervention (PCI), Coronary artery disease, Dyslipidemia, Dyspnea, History of anemia, Hypercholesterolemia, Hypertension, Ileus (HCC), Pleural effusion on left, Pneumonia, Renal failure, and Sleep apnea.   Surgical History:   Past Surgical History:  Procedure Laterality Date  . A/V FISTULAGRAM Left 07/17/2021   Procedure: A/V Fistulagram;  Surgeon: Victorino Sparrow, MD;  Location[facility] INVASIVE CV LAB;  Service: Cardiovascular;  Laterality: Left;  . AV FISTULA PLACEMENT Left 04/23/2021   Procedure: LEFT ARM  FISTULA CREATION;  Surgeon: Victorino Sparrow, MD;  Location: [facility] OR;  Service: Vascular;  Laterality: Left;  PERIPHERAL NERVE BLOCK  . BACK SURGERY    . CARDIAC CATHETERIZATION    . CAROTID ENDARTERECTOMY    . CARPAL TUNNEL RELEASE    . CORONARY ARTERY BYPASS GRAFT    . HERNIA REPAIR    . PERCUTANEOUS CORONARY STENT INTERVENTION (PCI-S)     multiple  . STERNOTOMY       Social History:   reports that he quit smoking about 46 years ago. His smoking use included cigarettes. He started smoking about 65 years ago. He has a 4.8 pack-year smoking history. He has never used smokeless tobacco. He reports current alcohol use. He reports that he does not use drugs.   Family History:  His family history includes Heart disease in his father.   Allergies Allergies  Allergen Reactions  . Atorvastatin Other (See Comments)    Muscle aches     Home Medications  Prior to Admission medications   Medication Sig Start Date End Date Taking? Authorizing Provider   acetaminophen (TYLENOL) 500 MG tablet Take 1,000 mg by mouth every 6 (six) hours as needed for moderate pain.   Yes [provider]  allopurinol (ZYLOPRIM) 100 MG tablet Take 100 mg by mouth See admin instructions. Take one tablet by mouth on Monday, Wednesdays and Fridays   Yes [provider]  ANORO ELLIPTA 62.5-25 MCG/ACT AEPB Inhale 1 puff into the lungs daily. 09/25/22  Yes Mannam, Praveen, MD  clopidogrel (PLAVIX) 75 MG tablet TAKE 1 TABLET BY MOUTH DAILY 10/28/22  Yes Corky Crafts, MD  ezetimibe (ZETIA) 10 MG tablet Take 10 mg by mouth in the morning.   Yes [provider]  hydrocortisone (ANUSOL-HC) 25 MG suppository Place 25 mg rectally daily as needed for hemorrhoids or anal itching.   Yes [provider]  latanoprost (XALATAN) 0.005 % ophthalmic solution Place 1 drop into both eyes at bedtime.   Yes [provider]  Multiple Vitamin (MULTIVITAMIN) tablet Take 1 tablet by mouth daily.  Yes [provider]  multivitamin (RENA-VIT) TABS tablet Take 1 tablet by mouth daily. 08/26/22  Yes [provider]  nitroGLYCERIN (NITROSTAT) 0.4 MG SL tablet Place 1 tablet (0.4 mg total) under the tongue every 5 (five) minutes as needed for chest pain. 03/17/16  Yes Corky Crafts, MD  Omega-3 Fatty Acids (FISH OIL) 1000 MG CAPS Take 1,000 mg by mouth daily.   Yes [provider]  rosuvastatin (CRESTOR) 10 MG tablet Take 10 mg by mouth daily.   Yes [provider]  silodosin (RAPAFLO) 4 MG CAPS capsule Take 4 mg by mouth daily with breakfast.   Yes [provider]  VELPHORO 500 MG chewable tablet Chew 500 mg by mouth 3 (three) times daily with meals. 05/15/22  Yes [provider]  amLODipine (NORVASC) 5 MG tablet Take 1 tablet (5 mg total) by mouth daily. Patient not taking: Reported on 12/25/2022 05/19/21   Alberteen Sam, MD     Critical care time: ***

## 2022-12-25 NOTE — Progress Notes (Signed)
Pharmacy Antibiotic Note  Peter Oneill is a 82 y.o. male admitted on 12/25/2022 with  intra-abdominal infection, concern for mesenteric ischemia .  Pharmacy has been consulted for zosyn dosing. Noted ESRD - last dialysis on 10/31.  Plan: Zosyn 2.25g q8h.  Follow culture data for de-escalation.  Monitor renal function for dose adjustments as indicated.   Height: 5\' 9"  (175.3 cm) Weight: 81.2 kg (179 lb) IBW/kg (Calculated) : 70.7  Temp (24hrs), Avg:97.8 F (36.6 C), Min:97.8 F (36.6 C), Max:97.8 F (36.6 C)  Recent Labs  Lab 12/25/22 1603 12/25/22 1815  WBC 6.3  --   CREATININE 3.71*  --   LATICACIDVEN  --  1.4    Estimated Creatinine Clearance: 15.6 mL/min (A) (by C-G formula based on SCr of 3.71 mg/dL (H)).    Allergies  Allergen Reactions   Atorvastatin Other (See Comments)    Muscle aches    Thank you for allowing pharmacy to be a part of this patient's care.  Estill Batten, PharmD, BCCCP  12/25/2022 9:13 PM

## 2022-12-25 NOTE — Anesthesia Procedure Notes (Signed)
Central Venous Catheter Insertion Performed by: Marcene Duos, MD, anesthesiologist Start/End10/31/2024 10:50 PM, 12/25/2022 11:00 PM Patient location: Pre-op. Preanesthetic checklist: patient identified, IV checked, site marked, risks and benefits discussed, surgical consent, monitors and equipment checked, pre-op evaluation, timeout performed and anesthesia consent Lidocaine 1% used for infiltration and patient sedated Hand hygiene performed  and maximum sterile barriers used  Catheter size: 8 Fr Total catheter length 16. Central line was placed.Double lumen Procedure performed using ultrasound guided technique. Ultrasound Notes:anatomy identified, needle tip was noted to be adjacent to the nerve/plexus identified, no ultrasound evidence of intravascular and/or intraneural injection and image(s) printed for medical record Attempts: 2 Following insertion, dressing applied, line sutured and Biopatch. Post procedure assessment: blood return through all ports  Patient tolerated the procedure well with no immediate complications.

## 2022-12-25 NOTE — ED Notes (Signed)
Pt transported to PACU on cardiac monitor with belongings and accompanied by family x2. Transported with RN.

## 2022-12-25 NOTE — ED Notes (Signed)
Pt c/o excruciating pain and requesting pain meds. Dr. Maple Hudson aware. No orders given or placed.

## 2022-12-25 NOTE — ED Notes (Signed)
Pharmacy has not sent zosyn yet.

## 2022-12-25 NOTE — Consult Note (Signed)
[facility]  ASSESSMENT / PLAN: 82 y.o. male with acute mesenteric ischemia. Disadvantaged mesenteric circulation with occlusion of celiac artery, significant stenosis of superior mesenteric artery, small inferior mesenteric artery.   Plan urgent revascularization with common femoral access, superior mesenteric artery stenting. General surgery is planning on following with exploratory laparotomy.   Patient with multiple medical comorbidities. Likely will have open abdomen. Will benefit from admission to critical care service for management of multiple comorbidities and likely ventilator care postoperatively.   CHIEF COMPLAINT: Abdominal pain  HISTORY OF PRESENT ILLNESS: Peter Oneill is a 82 y.o. male who presented to the emergency department earlier today for evaluation of shortness of breath and abdominal pain via rescue squad.  The patient had sudden onset severe abdominal pain earlier today.  The pain is somewhat improved with IV analgesia.  The pain is felt to be out of proportion to physical exam, and the ED physician ordered a CT angiogram of the abdomen pelvis.  This showed disadvantaged mesenteric circulation including occluded celiac artery, significant stenosis of the superior mesenteric artery, and a small inferior mesenteric artery.  I evaluated the patient with Dr. Azucena Cecil general surgery and agreed on a surgical plan of exploratory laparotomy along with mesenteric revascularization.  Past Medical History:  Diagnosis Date   Asbestosis (HCC)    Contraindication to percutaneous coronary intervention (PCI)    Coronary artery disease    Dyslipidemia    Dyspnea    History of anemia    Hypercholesterolemia    Hypertension    Ileus (HCC)    Pleural effusion on left    Pneumonia    Renal failure    Sleep apnea     Past Surgical History:  Procedure Laterality Date   A/V FISTULAGRAM Left 07/17/2021   Procedure: A/V Fistulagram;  Surgeon: Victorino Sparrow, MD;  Location: [facility] INVASIVE CV LAB;  Service: Cardiovascular;  Laterality: Left;   AV FISTULA PLACEMENT Left 04/23/2021   Procedure: LEFT ARM  FISTULA CREATION;  Surgeon: Victorino Sparrow, MD;  Location: [facility] OR;  Service: Vascular;  Laterality: Left;  PERIPHERAL NERVE BLOCK   BACK SURGERY     CARDIAC CATHETERIZATION     CAROTID ENDARTERECTOMY     CARPAL TUNNEL RELEASE     CORONARY ARTERY BYPASS GRAFT     HERNIA REPAIR     PERCUTANEOUS CORONARY STENT INTERVENTION (PCI-S)     multiple   STERNOTOMY      Family History  Problem Relation Age of Onset   Heart disease Father     Social History   Socioeconomic History   Marital status: Married    Spouse name: Not on file   Number of children: Not on file   Years of education: Not on file   Highest education level: Not on file  Occupational History   Not on file  Tobacco Use   Smoking status: Former    Current packs/day: 0.00    Average packs/day: 0.3 packs/day for 19.0 years (4.8 ttl pk-yrs)    Types: Cigarettes    Start date: 12/10/1957    Quit date: 12/10/1976    Years since quitting: 46.0   Smokeless tobacco: Never  Vaping Use   Vaping status: Never Used  Substance and Sexual Activity   Alcohol use: Yes    Comment: occasional   Drug use: No   Sexual activity: Yes    Comment: married  Other Topics Concern   Not on file  Social History  Narrative   Not on file   Social Determinants of Health   Financial Resource Strain: Not on file  Food Insecurity: Not on file  Transportation Needs: Not on file  Physical Activity: Not on file  Stress: Not on file  Social Connections: Unknown (07/09/2021)   Received from [facility], [facility]   Social Network    Social Network: Not on file  Intimate Partner Violence: Unknown (05/30/2021)   Received from [facility], [facility]   HITS    Physically Hurt: Not on file    Insult or Talk Down To: Not on file    Threaten Physical Harm: Not on file    Scream or  Curse: Not on file    Allergies  Allergen Reactions   Atorvastatin Other (See Comments)    Muscle aches    Current Facility-Administered Medications  Medication Dose Route Frequency Provider Last Rate Last Admin   piperacillin-tazobactam (ZOSYN) IVPB 2.25 g  2.25 g Intravenous Q8H Francena Hanly, [facility]       Current Outpatient Medications  Medication Sig Dispense Refill   acetaminophen (TYLENOL) 500 MG tablet Take 1,000 mg by mouth every 6 (six) hours as needed for moderate pain.     allopurinol (ZYLOPRIM) 100 MG tablet Take 100 mg by mouth See admin instructions. Take one tablet by mouth on Monday, Wednesdays and Fridays     ANORO ELLIPTA 62.5-25 MCG/ACT AEPB Inhale 1 puff into the lungs daily. 180 each 2   clopidogrel (PLAVIX) 75 MG tablet TAKE 1 TABLET BY MOUTH DAILY 90 tablet 0   ezetimibe (ZETIA) 10 MG tablet Take 10 mg by mouth in the morning.     hydrocortisone (ANUSOL-HC) 25 MG suppository Place 25 mg rectally daily as needed for hemorrhoids or anal itching.     latanoprost (XALATAN) 0.005 % ophthalmic solution Place 1 drop into both eyes at bedtime.     Multiple Vitamin (MULTIVITAMIN) tablet Take 1 tablet by mouth daily.     multivitamin (RENA-VIT) TABS tablet Take 1 tablet by mouth daily.     nitroGLYCERIN (NITROSTAT) 0.4 MG SL tablet Place 1 tablet (0.4 mg total) under the tongue every 5 (five) minutes as needed for chest pain. 25 tablet 3   Omega-3 Fatty Acids (FISH OIL) 1000 MG CAPS Take 1,000 mg by mouth daily.     rosuvastatin (CRESTOR) 10 MG tablet Take 10 mg by mouth daily.     silodosin (RAPAFLO) 4 MG CAPS capsule Take 4 mg by mouth daily with breakfast.     VELPHORO 500 MG chewable tablet Chew 500 mg by mouth 3 (three) times daily with meals.     amLODipine (NORVASC) 5 MG tablet Take 1 tablet (5 mg total) by mouth daily. (Patient not taking: Reported on 12/25/2022) 30 tablet 3    PHYSICAL EXAM Vitals:   12/25/22 1730 12/25/22 1807 12/25/22 1821 12/25/22 1830   BP: (!) 150/59   (!) 139/54  Pulse: 83   77  Resp: (!) 24   16  Temp:   97.8 F (36.6 C)   TempSrc:   Oral   SpO2: 100%   100%  Weight:  81.2 kg    Height:  5\' 9"  (1.753 m)     Elderly, chronically ill gentleman in no distress Regular rate and rhythm Unlabored breathing Left upper extremity arteriovenous fistula smooth thrill Tender to palpation throughout the abdomen.  No peritonitis. 2+ femoral pulses bilaterally  PERTINENT LABORATORY AND RADIOLOGIC DATA  Most recent CBC  Latest Ref Rng & Units 12/25/2022    4:03 PM 07/17/2021   11:59 AM 07/12/2021   10:57 AM  CBC  WBC 4.0 - 10.5 K/uL 6.3   5.5   Hemoglobin 13.0 - 17.0 g/dL 16.1  09.6  04.5   Hematocrit 39.0 - 52.0 % 35.3  39.0  33.5   Platelets 150 - 400 K/uL 233   212      Most recent CMP    Latest Ref Rng & Units 12/25/2022    4:03 PM 07/17/2021   11:59 AM 05/18/2021   12:42 AM  CMP  Glucose 70 - 99 mg/dL 409  86  811   BUN 8 - 23 mg/dL 26  26  43   Creatinine 0.61 - 1.24 mg/dL 9.14  7.82  9.56   Sodium 135 - 145 mmol/L 138  135  135   Potassium 3.5 - 5.1 mmol/L 3.9  3.9  3.6   Chloride 98 - 111 mmol/L 95  96  99   CO2 22 - 32 mmol/L 30   26   Calcium 8.9 - 10.3 mg/dL 8.9   8.3   Total Protein 6.5 - 8.1 g/dL 8.3     Total Bilirubin 0.3 - 1.2 mg/dL 0.9     Alkaline Phos 38 - 126 U/L 218     AST 15 - 41 U/L 48     ALT 0 - 44 U/L 40       Renal function Estimated Creatinine Clearance: 15.6 mL/min (A) (by C-G formula based on SCr of 3.71 mg/dL (H)).  No results found for: "HGBA1C"  LDL Calculated  Date Value Ref Range Status  03/17/2016 66 0 - 99 mg/dL Final   LDL Cholesterol  Date Value Ref Range Status  05/15/2021 21 0 - 99 mg/dL Final    Comment:           Total Cholesterol/HDL:CHD Risk Coronary Heart Disease Risk Table                     Men   Women  1/2 Average Risk   3.4   3.3  Average Risk       5.0   4.4  2 X Average Risk   9.6   7.1  3 X Average Risk  23.4   11.0        Use the  calculated Patient Ratio above and the CHD Risk Table to determine the patient's CHD Risk.        ATP III CLASSIFICATION (LDL):  <100     mg/dL   Optimal  213-086  mg/dL   Near or Above                    Optimal  130-159  mg/dL   Borderline  578-469  mg/dL   High  >629     mg/dL   Very High Performed at [facility], [address] [place], [place] [address]     Rande Brunt. Lenell Antu, MD FACS [facility] Phone Number: [phone] 12/25/2022 9:15 PM   Total time spent on preparing this encounter including chart review, data review, collecting history, examining the patient, coordinating care for this new patient, 60 minutes.  Portions of this report may have been transcribed using voice recognition software.  Every effort has been made to ensure accuracy; however, inadvertent computerized transcription errors may still be present.

## 2022-12-25 NOTE — ED Notes (Signed)
Report received from Laurance Flatten, RN and Cleda Daub RN. Assumed care of pt at this time.

## 2022-12-25 NOTE — Anesthesia Preprocedure Evaluation (Addendum)
Anesthesia Evaluation  Patient identified by MRN, date of birth, ID band Patient awake    Reviewed: Allergy & Precautions, NPO status , Patient's Chart, lab work & pertinent test results  Airway Mallampati: II  TM Distance: >3 FB Neck ROM: Full    Dental  (+) Dental Advisory Given   Pulmonary shortness of breath, sleep apnea , COPD, former smoker Restrictive lung dz 2/2 asbestosis    breath sounds clear to auscultation       Cardiovascular hypertension, Pt. on medications + CAD, + Cardiac Stents, + CABG, + Peripheral Vascular Disease and +CHF  + dysrhythmias Atrial Fibrillation  Rhythm:Regular Rate:Normal     Neuro/Psych negative neurological ROS     GI/Hepatic Neg liver ROS,,,Acute mesenteric ischemia   Endo/Other  negative endocrine ROS    Renal/GU ESRF and DialysisRenal disease     Musculoskeletal   Abdominal   Peds  Hematology  (+) Blood dyscrasia (on Plavix)   Anesthesia Other Findings   Reproductive/Obstetrics                             Lab Results  Component Value Date   WBC 6.3 12/25/2022   HGB 11.2 (L) 12/25/2022   HCT 35.3 (L) 12/25/2022   MCV 100.0 12/25/2022   PLT 233 12/25/2022   Lab Results  Component Value Date   NA 138 12/25/2022   CL 95 (L) 12/25/2022   K 3.9 12/25/2022   CO2 30 12/25/2022   BUN 26 (H) 12/25/2022   CREATININE 3.71 (H) 12/25/2022   GFRNONAA 16 (L) 12/25/2022   CALCIUM 8.9 12/25/2022   PHOS 5.0 (H) 05/18/2021   ALBUMIN 2.8 (L) 12/25/2022   GLUCOSE 179 (H) 12/25/2022    Anesthesia Physical Anesthesia Plan  ASA: 4 and emergent  Anesthesia Plan: General   Post-op Pain Management:    Induction: Intravenous  PONV Risk Score and Plan: 2 and Dexamethasone, Ondansetron and Treatment may vary due to age or medical condition  Airway Management Planned: Oral ETT  Additional Equipment: Arterial line, CVP and Ultrasound Guidance Line  Placement  Intra-op Plan:   Post-operative Plan: Possible Post-op intubation/ventilation  Informed Consent: I have reviewed the patients History and Physical, chart, labs and discussed the procedure including the risks, benefits and alternatives for the proposed anesthesia with the patient or authorized representative who has indicated his/her understanding and acceptance.     Dental advisory given  Plan Discussed with: CRNA  Anesthesia Plan Comments:         Anesthesia Quick Evaluation

## 2022-12-25 NOTE — ED Notes (Signed)
Pt called out again for severe pain. Provider made aware again. Awaiting orders.

## 2022-12-26 ENCOUNTER — Inpatient Hospital Stay (HOSPITAL_COMMUNITY): Payer: Medicare HMO

## 2022-12-26 ENCOUNTER — Other Ambulatory Visit: Payer: Self-pay

## 2022-12-26 ENCOUNTER — Encounter (HOSPITAL_COMMUNITY): Payer: Self-pay | Admitting: Vascular Surgery

## 2022-12-26 DIAGNOSIS — K55059 Acute (reversible) ischemia of intestine, part and extent unspecified: Secondary | ICD-10-CM

## 2022-12-26 DIAGNOSIS — R579 Shock, unspecified: Secondary | ICD-10-CM

## 2022-12-26 DIAGNOSIS — K559 Vascular disorder of intestine, unspecified: Principal | ICD-10-CM

## 2022-12-26 DIAGNOSIS — Z9889 Other specified postprocedural states: Secondary | ICD-10-CM | POA: Diagnosis not present

## 2022-12-26 DIAGNOSIS — E440 Moderate protein-calorie malnutrition: Secondary | ICD-10-CM | POA: Insufficient documentation

## 2022-12-26 LAB — COMPREHENSIVE METABOLIC PANEL
ALT: 36 U/L (ref 0–44)
AST: 54 U/L — ABNORMAL HIGH (ref 15–41)
Albumin: 2.3 g/dL — ABNORMAL LOW (ref 3.5–5.0)
Alkaline Phosphatase: 72 U/L (ref 38–126)
Anion gap: 12 (ref 5–15)
BUN: 34 mg/dL — ABNORMAL HIGH (ref 8–23)
CO2: 18 mmol/L — ABNORMAL LOW (ref 22–32)
Calcium: 8.2 mg/dL — ABNORMAL LOW (ref 8.9–10.3)
Chloride: 105 mmol/L (ref 98–111)
Creatinine, Ser: 4.18 mg/dL — ABNORMAL HIGH (ref 0.61–1.24)
GFR, Estimated: 14 mL/min — ABNORMAL LOW (ref >60)
Glucose, Bld: 189 mg/dL — ABNORMAL HIGH (ref 70–99)
Potassium: 4.8 mmol/L (ref 3.5–5.1)
Sodium: 135 mmol/L (ref 135–145)
Total Bilirubin: 2.5 mg/dL — ABNORMAL HIGH (ref 0.3–1.2)
Total Protein: 5 g/dL — ABNORMAL LOW (ref 6.5–8.1)

## 2022-12-26 LAB — GLOBAL TEG PANEL
CFF Max Amplitude: 19.5 mm (ref 15–32)
CK with Heparinase (R): 5.4 min (ref 4.3–8.3)
Citrated Functional Fibrinogen: 355.8 mg/dL (ref 278–581)
Citrated Kaolin (K): 1.4 min (ref 0.8–2.1)
Citrated Kaolin (MA): 58.9 mm (ref 52–69)
Citrated Kaolin (R): 6 min (ref 4.6–9.1)
Citrated Kaolin Angle: 70.9 deg (ref 63–78)
Citrated Rapid TEG (MA): 57.5 mm (ref 52–70)

## 2022-12-26 LAB — RENAL FUNCTION PANEL
Albumin: 2.2 g/dL — ABNORMAL LOW (ref 3.5–5.0)
Anion gap: 12 (ref 5–15)
BUN: 38 mg/dL — ABNORMAL HIGH (ref 8–23)
CO2: 20 mmol/L — ABNORMAL LOW (ref 22–32)
Calcium: 8.5 mg/dL — ABNORMAL LOW (ref 8.9–10.3)
Chloride: 107 mmol/L (ref 98–111)
Creatinine, Ser: 4.18 mg/dL — ABNORMAL HIGH (ref 0.61–1.24)
GFR, Estimated: 14 mL/min — ABNORMAL LOW (ref >60)
Glucose, Bld: 156 mg/dL — ABNORMAL HIGH (ref 70–99)
Phosphorus: 5.1 mg/dL — ABNORMAL HIGH (ref 2.5–4.6)
Potassium: 5 mmol/L (ref 3.5–5.1)
Sodium: 139 mmol/L (ref 135–145)

## 2022-12-26 LAB — POCT I-STAT 7, (LYTES, BLD GAS, ICA,H+H)
Acid-Base Excess: 4 mmol/L — ABNORMAL HIGH (ref 0.0–2.0)
Acid-Base Excess: 2 mmol/L (ref 0.0–2.0)
Acid-base deficit: 3 mmol/L — ABNORMAL HIGH (ref 0.0–2.0)
Acid-base deficit: 8 mmol/L — ABNORMAL HIGH (ref 0.0–2.0)
Acid-base deficit: 1 mmol/L (ref 0.0–2.0)
Acid-base deficit: 2 mmol/L (ref 0.0–2.0)
Acid-base deficit: 3 mmol/L — ABNORMAL HIGH (ref 0.0–2.0)
Bicarbonate: 29.9 mmol/L — ABNORMAL HIGH (ref 20.0–28.0)
Bicarbonate: 25.8 mmol/L (ref 20.0–28.0)
Bicarbonate: 21.4 mmol/L (ref 20.0–28.0)
Bicarbonate: 17.7 mmol/L — ABNORMAL LOW (ref 20.0–28.0)
Bicarbonate: 20.7 mmol/L (ref 20.0–28.0)
Bicarbonate: 20.4 mmol/L (ref 20.0–28.0)
Bicarbonate: 22.1 mmol/L (ref 20.0–28.0)
Calcium, Ion: 1.09 mmol/L — ABNORMAL LOW (ref 1.15–1.40)
Calcium, Ion: 1.07 mmol/L — ABNORMAL LOW (ref 1.15–1.40)
Calcium, Ion: 1.26 mmol/L (ref 1.15–1.40)
Calcium, Ion: 1.06 mmol/L — ABNORMAL LOW (ref 1.15–1.40)
Calcium, Ion: 1.08 mmol/L — ABNORMAL LOW (ref 1.15–1.40)
Calcium, Ion: 1.12 mmol/L — ABNORMAL LOW (ref 1.15–1.40)
Calcium, Ion: 1.13 mmol/L — ABNORMAL LOW (ref 1.15–1.40)
HCT: 33 % — ABNORMAL LOW (ref 39.0–52.0)
HCT: 28 % — ABNORMAL LOW (ref 39.0–52.0)
HCT: 26 % — ABNORMAL LOW (ref 39.0–52.0)
HCT: 35 % — ABNORMAL LOW (ref 39.0–52.0)
HCT: 28 % — ABNORMAL LOW (ref 39.0–52.0)
HCT: 24 % — ABNORMAL LOW (ref 39.0–52.0)
HCT: 25 % — ABNORMAL LOW (ref 39.0–52.0)
Hemoglobin: 11.2 g/dL — ABNORMAL LOW (ref 13.0–17.0)
Hemoglobin: 9.5 g/dL — ABNORMAL LOW (ref 13.0–17.0)
Hemoglobin: 8.8 g/dL — ABNORMAL LOW (ref 13.0–17.0)
Hemoglobin: 11.9 g/dL — ABNORMAL LOW (ref 13.0–17.0)
Hemoglobin: 9.5 g/dL — ABNORMAL LOW (ref 13.0–17.0)
Hemoglobin: 8.2 g/dL — ABNORMAL LOW (ref 13.0–17.0)
Hemoglobin: 8.5 g/dL — ABNORMAL LOW (ref 13.0–17.0)
O2 Saturation: 100 %
O2 Saturation: 100 %
O2 Saturation: 100 %
O2 Saturation: 100 %
O2 Saturation: 100 %
O2 Saturation: 100 %
O2 Saturation: 99 %
Patient temperature: 36
Patient temperature: 97.7
Patient temperature: 35.7
Patient temperature: 37.1
Patient temperature: 36.8
Patient temperature: 37
Potassium: 4.2 mmol/L (ref 3.5–5.1)
Potassium: 4.1 mmol/L (ref 3.5–5.1)
Potassium: 4.4 mmol/L (ref 3.5–5.1)
Potassium: 4.5 mmol/L (ref 3.5–5.1)
Potassium: 4.8 mmol/L (ref 3.5–5.1)
Potassium: 4.7 mmol/L (ref 3.5–5.1)
Potassium: 5 mmol/L (ref 3.5–5.1)
Sodium: 137 mmol/L (ref 135–145)
Sodium: 138 mmol/L (ref 135–145)
Sodium: 139 mmol/L (ref 135–145)
Sodium: 139 mmol/L (ref 135–145)
Sodium: 138 mmol/L (ref 135–145)
Sodium: 138 mmol/L (ref 135–145)
Sodium: 135 mmol/L (ref 135–145)
TCO2: 31 mmol/L (ref 22–32)
TCO2: 27 mmol/L (ref 22–32)
TCO2: 22 mmol/L (ref 22–32)
TCO2: 19 mmol/L — ABNORMAL LOW (ref 22–32)
TCO2: 21 mmol/L — ABNORMAL LOW (ref 22–32)
TCO2: 21 mmol/L — ABNORMAL LOW (ref 22–32)
TCO2: 23 mmol/L (ref 22–32)
pCO2 arterial: 49.1 mm[Hg] — ABNORMAL HIGH (ref 32–48)
pCO2 arterial: 35.6 mm[Hg] (ref 32–48)
pCO2 arterial: 31.5 mm[Hg] — ABNORMAL LOW (ref 32–48)
pCO2 arterial: 33.4 mm[Hg] (ref 32–48)
pCO2 arterial: 25.4 mm[Hg] — ABNORMAL LOW (ref 32–48)
pCO2 arterial: 26.1 mm[Hg] — ABNORMAL LOW (ref 32–48)
pCO2 arterial: 37.3 mm[Hg] (ref 32–48)
pH, Arterial: 7.393 (ref 7.35–7.45)
pH, Arterial: 7.464 — ABNORMAL HIGH (ref 7.35–7.45)
pH, Arterial: 7.436 (ref 7.35–7.45)
pH, Arterial: 7.327 — ABNORMAL LOW (ref 7.35–7.45)
pH, Arterial: 7.52 — ABNORMAL HIGH (ref 7.35–7.45)
pH, Arterial: 7.501 — ABNORMAL HIGH (ref 7.35–7.45)
pH, Arterial: 7.38 (ref 7.35–7.45)
pO2, Arterial: 546 mm[Hg] — ABNORMAL HIGH (ref 83–108)
pO2, Arterial: 198 mm[Hg] — ABNORMAL HIGH (ref 83–108)
pO2, Arterial: 418 mm[Hg] — ABNORMAL HIGH (ref 83–108)
pO2, Arterial: 182 mm[Hg] — ABNORMAL HIGH (ref 83–108)
pO2, Arterial: 174 mm[Hg] — ABNORMAL HIGH (ref 83–108)
pO2, Arterial: 167 mm[Hg] — ABNORMAL HIGH (ref 83–108)
pO2, Arterial: 162 mm[Hg] — ABNORMAL HIGH (ref 83–108)

## 2022-12-26 LAB — CBC
HCT: 25.8 % — ABNORMAL LOW (ref 39.0–52.0)
HCT: 34.3 % — ABNORMAL LOW (ref 39.0–52.0)
HCT: 30.9 % — ABNORMAL LOW (ref 39.0–52.0)
HCT: 24.4 % — ABNORMAL LOW (ref 39.0–52.0)
Hemoglobin: 8.6 g/dL — ABNORMAL LOW (ref 13.0–17.0)
Hemoglobin: 11.4 g/dL — ABNORMAL LOW (ref 13.0–17.0)
Hemoglobin: 11 g/dL — ABNORMAL LOW (ref 13.0–17.0)
Hemoglobin: 8.8 g/dL — ABNORMAL LOW (ref 13.0–17.0)
MCH: 31.3 pg (ref 26.0–34.0)
MCH: 30.2 pg (ref 26.0–34.0)
MCH: 31.3 pg (ref 26.0–34.0)
MCH: 31.5 pg (ref 26.0–34.0)
MCHC: 33.3 g/dL (ref 30.0–36.0)
MCHC: 33.2 g/dL (ref 30.0–36.0)
MCHC: 35.6 g/dL (ref 30.0–36.0)
MCHC: 36.1 g/dL — ABNORMAL HIGH (ref 30.0–36.0)
MCV: 93.8 fL (ref 80.0–100.0)
MCV: 91 fL (ref 80.0–100.0)
MCV: 87.8 fL (ref 80.0–100.0)
MCV: 87.5 fL (ref 80.0–100.0)
Platelets: 142 10*3/uL — ABNORMAL LOW (ref 150–400)
Platelets: 121 10*3/uL — ABNORMAL LOW (ref 150–400)
Platelets: 106 10*3/uL — ABNORMAL LOW (ref 150–400)
Platelets: 102 10*3/uL — ABNORMAL LOW (ref 150–400)
RBC: 2.75 MIL/uL — ABNORMAL LOW (ref 4.22–5.81)
RBC: 3.77 MIL/uL — ABNORMAL LOW (ref 4.22–5.81)
RBC: 3.52 MIL/uL — ABNORMAL LOW (ref 4.22–5.81)
RBC: 2.79 MIL/uL — ABNORMAL LOW (ref 4.22–5.81)
RDW: 17.6 % — ABNORMAL HIGH (ref 11.5–15.5)
RDW: 16.1 % — ABNORMAL HIGH (ref 11.5–15.5)
RDW: 15.8 % — ABNORMAL HIGH (ref 11.5–15.5)
RDW: 15.9 % — ABNORMAL HIGH (ref 11.5–15.5)
WBC: 8.5 10*3/uL (ref 4.0–10.5)
WBC: 10.3 10*3/uL (ref 4.0–10.5)
WBC: 10.1 10*3/uL (ref 4.0–10.5)
WBC: 10.8 10*3/uL — ABNORMAL HIGH (ref 4.0–10.5)
nRBC: 0 % (ref 0.0–0.2)
nRBC: 0.2 % (ref 0.0–0.2)
nRBC: 0 % (ref 0.0–0.2)
nRBC: 0 % (ref 0.0–0.2)

## 2022-12-26 LAB — BASIC METABOLIC PANEL
Anion gap: 9 (ref 5–15)
Anion gap: 13 (ref 5–15)
BUN: 30 mg/dL — ABNORMAL HIGH (ref 8–23)
BUN: 29 mg/dL — ABNORMAL HIGH (ref 8–23)
CO2: 21 mmol/L — ABNORMAL LOW (ref 22–32)
CO2: 17 mmol/L — ABNORMAL LOW (ref 22–32)
Calcium: 9.2 mg/dL (ref 8.9–10.3)
Calcium: 7.9 mg/dL — ABNORMAL LOW (ref 8.9–10.3)
Chloride: 107 mmol/L (ref 98–111)
Chloride: 108 mmol/L (ref 98–111)
Creatinine, Ser: 3.72 mg/dL — ABNORMAL HIGH (ref 0.61–1.24)
Creatinine, Ser: 3.71 mg/dL — ABNORMAL HIGH (ref 0.61–1.24)
GFR, Estimated: 16 mL/min — ABNORMAL LOW (ref >60)
GFR, Estimated: 16 mL/min — ABNORMAL LOW (ref >60)
Glucose, Bld: 166 mg/dL — ABNORMAL HIGH (ref 70–99)
Glucose, Bld: 242 mg/dL — ABNORMAL HIGH (ref 70–99)
Potassium: 4.3 mmol/L (ref 3.5–5.1)
Potassium: 4.5 mmol/L (ref 3.5–5.1)
Sodium: 137 mmol/L (ref 135–145)
Sodium: 138 mmol/L (ref 135–145)

## 2022-12-26 LAB — CG4 I-STAT (LACTIC ACID): Lactic Acid, Venous: 2.1 mmol/L (ref 0.5–1.9)

## 2022-12-26 LAB — CBC WITH DIFFERENTIAL/PLATELET
Abs Immature Granulocytes: 0.07 10*3/uL (ref 0.00–0.07)
Basophils Absolute: 0 10*3/uL (ref 0.0–0.1)
Basophils Relative: 0 %
Eosinophils Absolute: 0 10*3/uL (ref 0.0–0.5)
Eosinophils Relative: 0 %
HCT: 26.7 % — ABNORMAL LOW (ref 39.0–52.0)
Hemoglobin: 9.6 g/dL — ABNORMAL LOW (ref 13.0–17.0)
Immature Granulocytes: 1 %
Lymphocytes Relative: 5 %
Lymphs Abs: 0.6 10*3/uL — ABNORMAL LOW (ref 0.7–4.0)
MCH: 31.6 pg (ref 26.0–34.0)
MCHC: 36 g/dL (ref 30.0–36.0)
MCV: 87.8 fL (ref 80.0–100.0)
Monocytes Absolute: 1.3 10*3/uL — ABNORMAL HIGH (ref 0.1–1.0)
Monocytes Relative: 10 %
Neutro Abs: 10.7 10*3/uL — ABNORMAL HIGH (ref 1.7–7.7)
Neutrophils Relative %: 84 %
Platelets: 117 10*3/uL — ABNORMAL LOW (ref 150–400)
RBC: 3.04 MIL/uL — ABNORMAL LOW (ref 4.22–5.81)
RDW: 16.2 % — ABNORMAL HIGH (ref 11.5–15.5)
WBC: 12.7 10*3/uL — ABNORMAL HIGH (ref 4.0–10.5)
nRBC: 0.2 % (ref 0.0–0.2)

## 2022-12-26 LAB — PROTIME-INR
INR: 1.6 — ABNORMAL HIGH (ref 0.8–1.2)
Prothrombin Time: 19.5 s — ABNORMAL HIGH (ref 11.4–15.2)

## 2022-12-26 LAB — GLUCOSE, CAPILLARY
Glucose-Capillary: 175 mg/dL — ABNORMAL HIGH (ref 70–99)
Glucose-Capillary: 237 mg/dL — ABNORMAL HIGH (ref 70–99)
Glucose-Capillary: 220 mg/dL — ABNORMAL HIGH (ref 70–99)
Glucose-Capillary: 187 mg/dL — ABNORMAL HIGH (ref 70–99)
Glucose-Capillary: 152 mg/dL — ABNORMAL HIGH (ref 70–99)
Glucose-Capillary: 136 mg/dL — ABNORMAL HIGH (ref 70–99)

## 2022-12-26 LAB — HEMOGLOBIN A1C
Hgb A1c MFr Bld: 5.4 % (ref 4.8–5.6)
Mean Plasma Glucose: 108.28 mg/dL

## 2022-12-26 LAB — LACTIC ACID, PLASMA
Lactic Acid, Venous: 2 mmol/L (ref 0.5–1.9)
Lactic Acid, Venous: 4.2 mmol/L (ref 0.5–1.9)
Lactic Acid, Venous: 2.7 mmol/L (ref 0.5–1.9)
Lactic Acid, Venous: 2.8 mmol/L (ref 0.5–1.9)
Lactic Acid, Venous: 2.6 mmol/L (ref 0.5–1.9)
Lactic Acid, Venous: 1.7 mmol/L (ref 0.5–1.9)

## 2022-12-26 LAB — PREPARE RBC (CROSSMATCH)

## 2022-12-26 LAB — FIBRINOGEN: Fibrinogen: 327 mg/dL (ref 210–475)

## 2022-12-26 LAB — HEPARIN LEVEL (UNFRACTIONATED): Heparin Unfractionated: 0.17 [IU]/mL — ABNORMAL LOW (ref 0.30–0.70)

## 2022-12-26 LAB — MRSA NEXT GEN BY PCR, NASAL: MRSA by PCR Next Gen: NOT DETECTED

## 2022-12-26 LAB — CORTISOL: Cortisol, Plasma: 35.1 ug/dL

## 2022-12-26 LAB — APTT: aPTT: 63 s — ABNORMAL HIGH (ref 24–36)

## 2022-12-26 LAB — PHOSPHORUS: Phosphorus: 4.5 mg/dL (ref 2.5–4.6)

## 2022-12-26 LAB — MAGNESIUM
Magnesium: 1.9 mg/dL (ref 1.7–2.4)
Magnesium: 2.1 mg/dL (ref 1.7–2.4)

## 2022-12-26 LAB — BETA-HYDROXYBUTYRIC ACID: Beta-Hydroxybutyric Acid: 0.14 mmol/L (ref 0.05–0.27)

## 2022-12-26 MED ORDER — AMIODARONE HCL IN DEXTROSE 360-4.14 MG/200ML-% IV SOLN
INTRAVENOUS | Status: AC
  Filled 2022-12-26: qty 200

## 2022-12-26 MED ORDER — ALBUMIN HUMAN 25 % IV SOLN
25.0000 g | Freq: Once | INTRAVENOUS | Status: AC
  Filled 2022-12-26: qty 100

## 2022-12-26 MED ORDER — HEPARIN (PORCINE) 25000 UT/250ML-% IV SOLN
800.0000 [IU]/h | INTRAVENOUS | Status: DC
  Filled 2022-12-26: qty 250

## 2022-12-26 MED ORDER — PRISMASOL BGK 4/2.5 32-4-2.5 MEQ/L EC SOLN: Status: DC

## 2022-12-26 MED ORDER — MIDAZOLAM HCL 2 MG/2ML IJ SOLN
1.0000 mg | INTRAMUSCULAR | Status: DC | PRN
  Administered 2022-12-27 – 2023-01-04 (×22): 2 mg via INTRAVENOUS
  Filled 2022-12-26 (×24): qty 2

## 2022-12-26 MED ORDER — DEXMEDETOMIDINE HCL IN NACL 400 MCG/100ML IV SOLN
0.0000 ug/kg/h | INTRAVENOUS | Status: DC
  Administered 2022-12-26: 0.4 ug/kg/h via INTRAVENOUS
  Administered 2022-12-26 (×2): 1.2 ug/kg/h via INTRAVENOUS
  Administered 2022-12-27 (×3): 1 ug/kg/h via INTRAVENOUS
  Administered 2022-12-27 (×2): 1.2 ug/kg/h via INTRAVENOUS
  Administered 2022-12-28 (×3): 1 ug/kg/h via INTRAVENOUS
  Filled 2022-12-26 (×9): qty 100

## 2022-12-26 MED ORDER — LACTATED RINGERS IV BOLUS
1000.0000 mL | Freq: Once | INTRAVENOUS | Status: AC
  Administered 2022-12-26: 1000 mL via INTRAVENOUS

## 2022-12-26 MED ORDER — FENTANYL CITRATE PF 50 MCG/ML IJ SOSY: 25.0000 ug | PREFILLED_SYRINGE | Freq: Once | INTRAMUSCULAR | Status: AC

## 2022-12-26 MED ORDER — PIPERACILLIN-TAZOBACTAM 3.375 G IVPB
3.3750 g | Freq: Three times a day (TID) | INTRAVENOUS | Status: AC
  Administered 2022-12-26 – 2023-01-01 (×19): 3.375 g via INTRAVENOUS
  Filled 2022-12-26 (×19): qty 50

## 2022-12-26 MED ORDER — PHENYLEPHRINE HCL-NACL 20-0.9 MG/250ML-% IV SOLN
0.0000 ug/min | INTRAVENOUS | Status: DC
  Administered 2022-12-26 (×2): 160 ug/min via INTRAVENOUS
  Filled 2022-12-26: qty 250

## 2022-12-26 MED ORDER — PRISMASOL BGK 4/2.5 32-4-2.5 MEQ/L REPLACEMENT SOLN
Status: DC
  Filled 2022-12-26 (×7): qty 5000

## 2022-12-26 MED ORDER — HEPARIN SODIUM (PORCINE) 1000 UNIT/ML DIALYSIS: 1000.0000 [IU] | INTRAMUSCULAR | Status: DC | PRN

## 2022-12-26 MED ORDER — SODIUM BICARBONATE 8.4 % IV SOLN
50.0000 meq | Freq: Once | INTRAVENOUS | Status: AC
  Administered 2022-12-26: 50 meq via INTRAVENOUS
  Filled 2022-12-26: qty 50

## 2022-12-26 MED ORDER — ORAL CARE MOUTH RINSE: 15.0000 mL | OROMUCOSAL | Status: DC | PRN

## 2022-12-26 MED ORDER — EPINEPHRINE HCL 5 MG/250ML IV SOLN IN NS
0.5000 ug/min | INTRAVENOUS | Status: DC
  Administered 2022-12-26: 2 ug/min via INTRAVENOUS
  Filled 2022-12-26: qty 250

## 2022-12-26 MED ORDER — CALCIUM GLUCONATE-NACL 1-0.675 GM/50ML-% IV SOLN
1.0000 g | Freq: Once | INTRAVENOUS | Status: AC
  Administered 2022-12-26: 1000 mg via INTRAVENOUS
  Filled 2022-12-26: qty 50

## 2022-12-26 MED ORDER — ALBUMIN HUMAN 5 % IV SOLN: INTRAVENOUS | Status: DC | PRN

## 2022-12-26 MED ORDER — DOXERCALCIFEROL 4 MCG/2ML IV SOLN
4.0000 ug | INTRAVENOUS | Status: DC
  Administered 2022-12-26 – 2023-01-09 (×6): 4 ug via INTRAVENOUS
  Filled 2022-12-26 (×12): qty 2

## 2022-12-26 MED ORDER — ALBUMIN HUMAN 5 % IV SOLN
INTRAVENOUS | Status: AC
  Filled 2022-12-26: qty 250

## 2022-12-26 MED ORDER — FENTANYL 2500MCG IN NS 250ML (10MCG/ML) PREMIX INFUSION
25.0000 ug/h | INTRAVENOUS | Status: DC
  Administered 2022-12-26: 25 ug/h via INTRAVENOUS
  Administered 2022-12-27: 125 ug/h via INTRAVENOUS
  Administered 2022-12-28 (×2): 150 ug/h via INTRAVENOUS
  Administered 2022-12-29 – 2022-12-30 (×2): 75 ug/h via INTRAVENOUS
  Administered 2023-01-01: 50 ug/h via INTRAVENOUS
  Filled 2022-12-26 (×7): qty 250

## 2022-12-26 MED ORDER — HEPARIN SODIUM (PORCINE) 1000 UNIT/ML DIALYSIS
1000.0000 [IU] | INTRAMUSCULAR | Status: DC | PRN
  Administered 2022-12-26: 1000 [IU] via INTRAVENOUS_CENTRAL
  Administered 2022-12-27: 1400 [IU] via INTRAVENOUS_CENTRAL
  Administered 2023-01-02: 2800 [IU] via INTRAVENOUS_CENTRAL
  Administered 2023-01-03 – 2023-01-04 (×2): 3000 [IU] via INTRAVENOUS_CENTRAL
  Filled 2022-12-26: qty 3
  Filled 2022-12-26 (×2): qty 6
  Filled 2022-12-26: qty 3
  Filled 2022-12-26: qty 4
  Filled 2022-12-26 (×2): qty 6
  Filled 2022-12-26: qty 4
  Filled 2022-12-26: qty 6
  Filled 2022-12-26: qty 3

## 2022-12-26 MED ORDER — AMIODARONE LOAD VIA INFUSION
150.0000 mg | Freq: Once | INTRAVENOUS | Status: AC
  Administered 2022-12-26: 150 mg via INTRAVENOUS
  Filled 2022-12-26: qty 83.34

## 2022-12-26 MED ORDER — STERILE WATER FOR IRRIGATION IR SOLN
Status: DC | PRN
  Administered 2022-12-25: 1000 mL

## 2022-12-26 MED ORDER — VASOPRESSIN 20 UNITS/100 ML INFUSION FOR SHOCK
0.0000 [IU]/min | INTRAVENOUS | Status: DC
  Administered 2022-12-26: 0.03 [IU]/min via INTRAVENOUS
  Administered 2022-12-26: .04 [IU]/min via INTRAVENOUS
  Administered 2022-12-27: 0.02 [IU]/min via INTRAVENOUS
  Administered 2022-12-30 – 2023-01-07 (×22): 0.04 [IU]/min via INTRAVENOUS
  Administered 2023-01-08: 0.03 [IU]/min via INTRAVENOUS
  Administered 2023-01-08 (×2): 0.04 [IU]/min via INTRAVENOUS
  Administered 2023-01-09 – 2023-01-10 (×3): 0.03 [IU]/min via INTRAVENOUS
  Filled 2022-12-26 (×33): qty 100

## 2022-12-26 MED ORDER — VASOPRESSIN 20 UNIT/ML IV SOLN
INTRAVENOUS | Status: DC | PRN
  Administered 2022-12-26 (×3): 1 [IU] via INTRAVENOUS

## 2022-12-26 MED ORDER — FENTANYL BOLUS VIA INFUSION
25.0000 ug | INTRAVENOUS | Status: DC | PRN
Start: 2022-12-26 — End: 2023-01-03
  Administered 2022-12-26 – 2022-12-28 (×3): 50 ug via INTRAVENOUS
  Administered 2022-12-28: 75 ug via INTRAVENOUS
  Administered 2022-12-28: 50 ug via INTRAVENOUS
  Administered 2022-12-29: 100 ug via INTRAVENOUS
  Administered 2022-12-29: 75 ug via INTRAVENOUS
  Administered 2022-12-29 (×2): 25 ug via INTRAVENOUS
  Administered 2022-12-29: 75 ug via INTRAVENOUS
  Administered 2022-12-29: 100 ug via INTRAVENOUS
  Administered 2022-12-29: 75 ug via INTRAVENOUS
  Administered 2022-12-30 (×2): 100 ug via INTRAVENOUS
  Administered 2022-12-31: 25 ug via INTRAVENOUS
  Administered 2022-12-31: 100 ug via INTRAVENOUS
  Administered 2022-12-31 (×3): 50 ug via INTRAVENOUS
  Administered 2022-12-31: 25 ug via INTRAVENOUS
  Administered 2022-12-31: 100 ug via INTRAVENOUS
  Administered 2022-12-31: 25 ug via INTRAVENOUS
  Administered 2022-12-31: 50 ug via INTRAVENOUS
  Administered 2022-12-31: 25 ug via INTRAVENOUS
  Administered 2023-01-01: 100 ug via INTRAVENOUS
  Administered 2023-01-01: 25 ug via INTRAVENOUS
  Administered 2023-01-02: 75 ug via INTRAVENOUS
  Administered 2023-01-03: 50 ug via INTRAVENOUS

## 2022-12-26 MED ORDER — ALBUMIN HUMAN 5 % IV SOLN
INTRAVENOUS | Status: AC
  Administered 2022-12-26: 25 g
  Filled 2022-12-26: qty 500

## 2022-12-26 MED ORDER — INSULIN ASPART 100 UNIT/ML IJ SOLN
0.0000 [IU] | INTRAMUSCULAR | Status: DC
  Administered 2022-12-26 (×2): 3 [IU] via SUBCUTANEOUS
  Administered 2022-12-26 – 2022-12-30 (×9): 2 [IU] via SUBCUTANEOUS
  Administered 2022-12-30: 3 [IU] via SUBCUTANEOUS
  Administered 2022-12-30: 2 [IU] via SUBCUTANEOUS
  Administered 2022-12-30: 1 [IU] via SUBCUTANEOUS
  Administered 2022-12-30 – 2023-01-01 (×10): 2 [IU] via SUBCUTANEOUS
  Administered 2023-01-01: 3 [IU] via SUBCUTANEOUS
  Administered 2023-01-01 – 2023-01-02 (×4): 2 [IU] via SUBCUTANEOUS
  Administered 2023-01-02 (×2): 3 [IU] via SUBCUTANEOUS
  Administered 2023-01-02 – 2023-01-03 (×3): 2 [IU] via SUBCUTANEOUS
  Administered 2023-01-03 (×2): 3 [IU] via SUBCUTANEOUS
  Administered 2023-01-03: 2 [IU] via SUBCUTANEOUS
  Administered 2023-01-03: 3 [IU] via SUBCUTANEOUS
  Administered 2023-01-04 (×2): 2 [IU] via SUBCUTANEOUS
  Administered 2023-01-04: 3 [IU] via SUBCUTANEOUS
  Administered 2023-01-04: 2 [IU] via SUBCUTANEOUS
  Administered 2023-01-04: 0 [IU] via SUBCUTANEOUS
  Administered 2023-01-05 (×3): 2 [IU] via SUBCUTANEOUS
  Administered 2023-01-06: 3 [IU] via SUBCUTANEOUS
  Administered 2023-01-06 (×4): 2 [IU] via SUBCUTANEOUS
  Administered 2023-01-06: 3 [IU] via SUBCUTANEOUS
  Administered 2023-01-06 – 2023-01-07 (×2): 2 [IU] via SUBCUTANEOUS
  Administered 2023-01-07 (×2): 3 [IU] via SUBCUTANEOUS
  Administered 2023-01-07: 2 [IU] via SUBCUTANEOUS
  Administered 2023-01-07 – 2023-01-08 (×2): 3 [IU] via SUBCUTANEOUS
  Administered 2023-01-08 – 2023-01-09 (×5): 2 [IU] via SUBCUTANEOUS
  Administered 2023-01-09 (×3): 3 [IU] via SUBCUTANEOUS
  Administered 2023-01-09 – 2023-01-10 (×4): 2 [IU] via SUBCUTANEOUS
  Administered 2023-01-10: 3 [IU] via SUBCUTANEOUS
  Administered 2023-01-10: 2 [IU] via SUBCUTANEOUS

## 2022-12-26 MED ORDER — SODIUM CHLORIDE 0.9% FLUSH: 10.0000 mL | INTRAVENOUS | Status: DC | PRN

## 2022-12-26 MED ORDER — VASOPRESSIN 20 UNIT/ML IV SOLN
INTRAVENOUS | Status: AC
  Filled 2022-12-26: qty 1

## 2022-12-26 MED ORDER — AMIODARONE HCL IN DEXTROSE 360-4.14 MG/200ML-% IV SOLN
30.0000 mg/h | INTRAVENOUS | Status: DC
  Administered 2022-12-27: 30 mg/h via INTRAVENOUS

## 2022-12-26 MED ORDER — PROPOFOL 1000 MG/100ML IV EMUL
5.0000 ug/kg/min | INTRAVENOUS | Status: DC
  Administered 2022-12-26: 30 ug/kg/min via INTRAVENOUS
  Administered 2022-12-26 (×2): 20 ug/kg/min via INTRAVENOUS
  Filled 2022-12-26: qty 100

## 2022-12-26 MED ORDER — CALCITRIOL 0.25 MCG PO CAPS: 0.5000 ug | ORAL_CAPSULE | ORAL | Status: DC

## 2022-12-26 MED ORDER — HEPARIN (PORCINE) 25000 UT/250ML-% IV SOLN
900.0000 [IU]/h | INTRAVENOUS | Status: DC
  Administered 2022-12-26: 800 [IU]/h via INTRAVENOUS
  Filled 2022-12-26: qty 250

## 2022-12-26 MED ORDER — SODIUM CHLORIDE 0.9% IV SOLUTION
Freq: Once | INTRAVENOUS | Status: AC
Start: 2022-12-26 — End: 2022-12-26

## 2022-12-26 MED ORDER — REVEFENACIN 175 MCG/3ML IN SOLN
175.0000 ug | Freq: Every day | RESPIRATORY_TRACT | Status: DC
  Administered 2022-12-26 – 2023-01-10 (×16): 175 ug via RESPIRATORY_TRACT
  Filled 2022-12-26 (×17): qty 3

## 2022-12-26 MED ORDER — ORAL CARE MOUTH RINSE
15.0000 mL | OROMUCOSAL | Status: DC
  Administered 2022-12-26 – 2023-01-04 (×111): 15 mL via OROMUCOSAL

## 2022-12-26 MED ORDER — CALCIUM CHLORIDE 10 % IV SOLN
INTRAVENOUS | Status: DC | PRN
  Administered 2022-12-26: 300 mg via INTRAVENOUS
  Administered 2022-12-26: 500 mg via INTRAVENOUS
  Administered 2022-12-26: 200 mg via INTRAVENOUS

## 2022-12-26 MED ORDER — PROPOFOL 500 MG/50ML IV EMUL
INTRAVENOUS | Status: DC | PRN
  Administered 2022-12-26: 50 ug/kg/min via INTRAVENOUS

## 2022-12-26 MED ORDER — SODIUM CHLORIDE 0.9 % IV SOLN: 10.0000 mL/h | Freq: Once | INTRAVENOUS | Status: DC

## 2022-12-26 MED ORDER — SODIUM CHLORIDE 0.9% IV SOLUTION: Freq: Once | INTRAVENOUS | Status: AC

## 2022-12-26 MED ORDER — ARFORMOTEROL TARTRATE 15 MCG/2ML IN NEBU
15.0000 ug | INHALATION_SOLUTION | Freq: Two times a day (BID) | RESPIRATORY_TRACT | Status: DC
  Administered 2022-12-26 – 2023-01-10 (×31): 15 ug via RESPIRATORY_TRACT
  Filled 2022-12-26 (×31): qty 2

## 2022-12-26 MED ORDER — SODIUM CHLORIDE 0.9% FLUSH
10.0000 mL | Freq: Two times a day (BID) | INTRAVENOUS | Status: DC
  Administered 2022-12-26 – 2022-12-30 (×5): 10 mL
  Administered 2022-12-30: 30 mL
  Administered 2022-12-31 – 2023-01-10 (×20): 10 mL

## 2022-12-26 MED ORDER — NOREPINEPHRINE 4 MG/250ML-% IV SOLN
INTRAVENOUS | Status: DC | PRN
  Administered 2022-12-26: 4 ug/min via INTRAVENOUS

## 2022-12-26 MED ORDER — AMIODARONE HCL IN DEXTROSE 360-4.14 MG/200ML-% IV SOLN
60.0000 mg/h | INTRAVENOUS | Status: DC
  Administered 2022-12-26: 60 mg/h via INTRAVENOUS
  Filled 2022-12-26 (×2): qty 200

## 2022-12-26 MED ORDER — NOREPINEPHRINE 16 MG/250ML-% IV SOLN
0.0000 ug/min | INTRAVENOUS | Status: DC
  Administered 2022-12-26: 8 ug/min via INTRAVENOUS
  Administered 2022-12-26: 40 ug/min via INTRAVENOUS
  Administered 2022-12-27: 14 ug/min via INTRAVENOUS
  Administered 2022-12-28: 16 ug/min via INTRAVENOUS
  Administered 2022-12-29: 22 ug/min via INTRAVENOUS
  Administered 2022-12-30: 20 ug/min via INTRAVENOUS
  Administered 2022-12-31: 10 ug/min via INTRAVENOUS
  Administered 2023-01-01: 12 ug/min via INTRAVENOUS
  Administered 2023-01-01: 9 ug/min via INTRAVENOUS
  Administered 2023-01-02: 20 ug/min via INTRAVENOUS
  Administered 2023-01-03: 18 ug/min via INTRAVENOUS
  Administered 2023-01-03: 24 ug/min via INTRAVENOUS
  Administered 2023-01-04: 14 ug/min via INTRAVENOUS
  Administered 2023-01-06: 6 ug/min via INTRAVENOUS
  Administered 2023-01-08: 5 ug/min via INTRAVENOUS
  Administered 2023-01-09: 44 ug/min via INTRAVENOUS
  Administered 2023-01-09: 50 ug/min via INTRAVENOUS
  Administered 2023-01-09: 32 ug/min via INTRAVENOUS
  Administered 2023-01-10: 16 ug/min via INTRAVENOUS
  Filled 2022-12-26 (×20): qty 250

## 2022-12-26 MED ORDER — CHLORHEXIDINE GLUCONATE CLOTH 2 % EX PADS
6.0000 | MEDICATED_PAD | Freq: Every day | CUTANEOUS | Status: DC
  Administered 2022-12-26 – 2023-01-09 (×16): 6 via TOPICAL

## 2022-12-26 NOTE — Progress Notes (Signed)
PHARMACY - ANTICOAGULATION CONSULT NOTE  Pharmacy Consult for Heparin  Indication: Mesenteric ischemia s/p angioplasty/stenting of SMA  Allergies  Allergen Reactions   Atorvastatin Other (See Comments)    Muscle aches    Patient Measurements: Height: 5\' 9"  (175.3 cm) Weight: 81.2 kg (179 lb) IBW/kg (Calculated) : 70.7  Vital Signs: Temp: 97.8 F (36.6 C) (11/01 0403) Temp Source: Oral (11/01 0300) BP: 147/49 (11/01 0400) Pulse Rate: 101 (11/01 0445)  Labs: Recent Labs    12/25/22 1603 12/25/22 1815 12/25/22 2311 12/26/22 0104 12/26/22 0229 12/26/22 0232 12/26/22 0300  HGB 11.2*  --    < > 9.5* 8.6* 8.8*  --   HCT 35.3*  --    < > 28.0* 25.8* 26.0*  --   PLT 233  --   --   --  142*  --   --   APTT  --   --   --   --   --   --  63*  LABPROT  --   --   --   --   --   --  19.5*  INR  --   --   --   --   --   --  1.6*  CREATININE 3.71*  --   --   --  3.72*  --   --   TROPONINIHS 80* 74*  --   --   --   --   --    < > = values in this interval not displayed.    Estimated Creatinine Clearance: 15.6 mL/min (A) (by C-G formula based on SCr of 3.72 mg/dL (H)).   Medical History: Past Medical History:  Diagnosis Date   Asbestosis (HCC)    Contraindication to percutaneous coronary intervention (PCI)    Coronary artery disease    Dyslipidemia    Dyspnea    History of anemia    Hypercholesterolemia    Hypertension    Ileus (HCC)    Pleural effusion on left    Pneumonia    Renal failure    Sleep apnea     Assessment: 82 y/o M with mesenteric ischemia s/p OR with vascular and general surgery. S/P angioplasty and stenting of the SMA and right hemi-colectomy. Starting heparin. Low goal requested. Labs above reviewed. Required some blood/pressors in OR.   Goal of Therapy:  Heparin level 0.2-0.5 units/ml Monitor platelets by anticoagulation protocol: Yes   Plan:  No boluses Start heparin drip at 800 units/hr Heparin level in 6-8 hours Monitor closely for  bleeding  Abran Duke, PharmD, BCPS Clinical Pharmacist Phone: [phone]

## 2022-12-26 NOTE — Plan of Care (Signed)
  Problem: Clinical Measurements: Goal: Will remain free from infection Outcome: Progressing Goal: Diagnostic test results will improve Outcome: Progressing   Problem: Activity: Goal: Risk for activity intolerance will decrease Outcome: Progressing   Problem: Coping: Goal: Level of anxiety will decrease Outcome: Progressing   Problem: Pain Management: Goal: General experience of comfort will improve Outcome: Progressing   Problem: Safety: Goal: Ability to remain free from injury will improve Outcome: Progressing   Problem: Skin Integrity: Goal: Risk for impaired skin integrity will decrease Outcome: Progressing

## 2022-12-26 NOTE — Procedures (Signed)
Central Venous Catheter Insertion Procedure Note  [place]  [id number]  13-Apr-1940  Date:12/26/22  Time:3:59 AM   Provider Performing:Torien Ramroop R Shelsy Seng   Procedure: Insertion of Non-tunneled Central Venous Catheter(36556)with US guidance (40981)    Indication(s) Medication administration and Hemodialysis  Consent Unable to obtain consent due to emergent nature of procedure.  Anesthesia Topical only with 1% lidocaine   Timeout Verified patient identification, verified procedure, site/side was marked, verified correct patient position, special equipment/implants available, medications/allergies/relevant history reviewed, required imaging and test results available.  Sterile Technique Maximal sterile technique including full sterile barrier drape, hand hygiene, sterile gown, sterile gloves, mask, hair covering, sterile ultrasound probe cover (if used).  Procedure Description Area of catheter insertion was cleaned with chlorhexidine and draped in sterile fashion.   With real-time ultrasound guidance a HD catheter was placed into the left femoral vein.  Nonpulsatile blood flow and easy flushing noted in all ports.  The catheter was sutured in place and sterile dressing applied.  Complications/Tolerance None; patient tolerated the procedure well. Chest X-ray is ordered to verify placement for internal jugular or subclavian cannulation.  Chest x-ray is not ordered for femoral cannulation.  EBL Minimal  Specimen(s) None   Darcella Gasman Arias Weinert, PA-C

## 2022-12-26 NOTE — Consult Note (Signed)
Renal Service Consult Note [facility]  Peter Oneill 12/26/2022 [name], MD Requesting Physician: Dr. Merrily Pew  Reason for Consult: ESRD pt in shock after treatment for acute mesenteric ischemia HPI: The patient is a 82 y.o. year-old w/ PMH as below who presented to ED yesterday w/ abd pain and SOB. Had HD finished his dialysis that morning. In ED his pain progressed and CT confirmed mesenteric ischemia w/ occlusion of the celiac trunk and narrowing of the SMA and IMA. Pt was taken to the OR by general and vascular surgery and underwent R colectomy, left in discontinuity. Also had SMA revasc a/ angioplasty and stenting. Transferred to ICU there after and admitted per CCM.  IV heparin was started. The postop shock was severe and pt was on 4 drips, but is now down to 2 drips. Temp HD cath was placed by CCM. We are asked to see for CRRT.     Pt seen in room, on vent, no hx obtained. Discussed w/ RN and pt's wife and daughter.   ROS - n/a  Past Medical History  Past Medical History:  Diagnosis Date   Asbestosis (HCC)    Contraindication to percutaneous coronary intervention (PCI)    Coronary artery disease    Dyslipidemia    Dyspnea    ESRD on hemodialysis (HCC)    History of anemia    Hypercholesterolemia    Hypertension    Ileus (HCC)    Pleural effusion on left    Pneumonia    Sleep apnea    Past Surgical History  Past Surgical History:  Procedure Laterality Date   A/V FISTULAGRAM Left 07/17/2021   Procedure: A/V Fistulagram;  Surgeon: Victorino Sparrow, MD;  Location: [facility] INVASIVE CV LAB;  Service: Cardiovascular;  Laterality: Left;   AORTOGRAM Right 12/25/2022   Procedure: AORTOGRAM;  Surgeon: Leonie Douglas, MD;  Location: [facility] OR;  Service: Vascular;  Laterality: Right;   APPLICATION OF WOUND VAC N/A 12/25/2022   Procedure: APPLICATION OF WOUND VAC;  Surgeon: Leonie Douglas, MD;  Location: [facility] OR;  Service: Vascular;  Laterality: N/A;   AV FISTULA  PLACEMENT Left 04/23/2021   Procedure: LEFT ARM  FISTULA CREATION;  Surgeon: Victorino Sparrow, MD;  Location: [facility] OR;  Service: Vascular;  Laterality: Left;  PERIPHERAL NERVE BLOCK   BACK SURGERY     CARDIAC CATHETERIZATION     CAROTID ENDARTERECTOMY     CARPAL TUNNEL RELEASE     CORONARY ARTERY BYPASS GRAFT     HERNIA REPAIR     INSERTION OF ILIAC STENT N/A 12/25/2022   Procedure: MESENTERIC  STENT;  Surgeon: Leonie Douglas, MD;  Location: [facility] OR;  Service: Vascular;  Laterality: N/A;   LAPAROTOMY  12/25/2022   Procedure: EXPLORATORY LAPAROTOMY; RIGHT HEMICOLECTOMY: HEPATIC FLEXURE MOBILIZATION;  Surgeon: Leonie Douglas, MD;  Location: [facility] OR;  Service: Vascular;;   PERCUTANEOUS CORONARY STENT INTERVENTION (PCI-S)     multiple   STERNOTOMY     ULTRASOUND GUIDANCE FOR VASCULAR ACCESS Right 12/25/2022   Procedure: ULTRASOUND GUIDANCE FOR VASCULAR ACCESS;  Surgeon: Leonie Douglas, MD;  Location: [facility] OR;  Service: Vascular;  Laterality: Right;   Family History  Family History  Problem Relation Age of Onset   Heart disease Father    Social History  reports that he quit smoking about 46 years ago. His smoking use included cigarettes. He started smoking about 65 years ago. He has a 4.8 pack-year smoking history. He has never  used smokeless tobacco. He reports current alcohol use. He reports that he does not use drugs. Allergies  Allergies  Allergen Reactions   Atorvastatin Other (See Comments)    Muscle aches   Home medications Prior to Admission medications   Medication Sig Start Date End Date Taking? Authorizing Provider  acetaminophen (TYLENOL) 500 MG tablet Take 1,000 mg by mouth every 6 (six) hours as needed for moderate pain.   Yes [provider]  allopurinol (ZYLOPRIM) 100 MG tablet Take 100 mg by mouth See admin instructions. Take one tablet by mouth on Monday, Wednesdays and Fridays   Yes [provider]  ANORO ELLIPTA 62.5-25 MCG/ACT AEPB Inhale 1 puff into  the lungs daily. 09/25/22  Yes Mannam, Praveen, MD  clopidogrel (PLAVIX) 75 MG tablet TAKE 1 TABLET BY MOUTH DAILY 10/28/22  Yes Corky Crafts, MD  ezetimibe (ZETIA) 10 MG tablet Take 10 mg by mouth in the morning.   Yes [provider]  hydrocortisone (ANUSOL-HC) 25 MG suppository Place 25 mg rectally daily as needed for hemorrhoids or anal itching.   Yes [provider]  latanoprost (XALATAN) 0.005 % ophthalmic solution Place 1 drop into both eyes at bedtime.   Yes [provider]  Multiple Vitamin (MULTIVITAMIN) tablet Take 1 tablet by mouth daily.   Yes [provider]  multivitamin (RENA-VIT) TABS tablet Take 1 tablet by mouth daily. 08/26/22  Yes [provider]  nitroGLYCERIN (NITROSTAT) 0.4 MG SL tablet Place 1 tablet (0.4 mg total) under the tongue every 5 (five) minutes as needed for chest pain. 03/17/16  Yes Corky Crafts, MD  Omega-3 Fatty Acids (FISH OIL) 1000 MG CAPS Take 1,000 mg by mouth daily.   Yes [provider]  rosuvastatin (CRESTOR) 10 MG tablet Take 10 mg by mouth daily.   Yes [provider]  silodosin (RAPAFLO) 4 MG CAPS capsule Take 4 mg by mouth daily with breakfast.   Yes [provider]  VELPHORO 500 MG chewable tablet Chew 500 mg by mouth 3 (three) times daily with meals. 05/15/22  Yes [provider]  amLODipine (NORVASC) 5 MG tablet Take 1 tablet (5 mg total) by mouth daily. Patient not taking: Reported on 12/25/2022 05/19/21   Alberteen Sam, MD     Vitals:   12/26/22 1345 12/26/22 1400 12/26/22 1415 12/26/22 1430  BP:      Pulse: (!) 55 (!) 51 (!) 53 (!) 53  Resp: (!) 28 (!) 23 (!) 26 (!) 27  Temp: 98.4 F (36.9 C) 98.4 F (36.9 C) 98.4 F (36.9 C) 98.4 F (36.9 C)  TempSrc:      SpO2: 100% 100% 100% 100%  Weight:      Height:       Exam Gen on vent, sedated No rash, cyanosis or gangrene Sclera anicteric, throat w/ ETT No jvd or bruits Chest clear  anterior/ lateral RRR no RG Abd +dressing post-op, no ascites  GU normal male MS no joint effusions or deformity Ext no UE or LE edema, no wounds or ulcers Neuro is on vent, sedated    LUA AVF+bruit       Renal-related home meds: - plavix - renavite - velphoro 500 ac tid - norvasc 5 every day - others: statin, zetia, allopurinol, rapaflo    OP HD: TTS NW 4h   350/1.5    79.6kg   2/2 bath   AVF   Heparin none - last OP HD 10/31, post wt 79.5kg -  getting to dry wt, good compliance w/ HD - rocaltrol 0.5 mcg  - mircera 30 mcg IV q 4 wks, last    Assessment/ Plan: Acute mesenteric ischemia - sp R colon resection, revascularization of the SMA. Bowel left in discontinuity. Will likely get another washout tomorrow. IV heparin started for vascular patency.  Shock - likely combination of hemorrhagic shock and vasoplegia. Pressors are being weaned down relatively rapidly.  ABL anemia - post op Hb was 3. Per pmd ESKD - on HD TTS. Has not missed OP HD. Will need CRRT at this time using the temp cath that was placed.  Volume - doesn't look vol overloaded, is only up 2kg by wts, not edematous. Keep 50cc/hr net +, CVP was only 5. Follow.  Anemia of eskd - Hb 10-12 range, follow.  MBD ckd - CCa in range and phos is in range. Can continue po vdra thru the tube. Hold vephoro as binder until eating again.      Rob Levada Bowersox  MD [facility] 12/26/2022, 2:41 PM  Recent Labs  Lab 12/25/22 1603 12/25/22 2311 12/26/22 0229 12/26/22 0232 12/26/22 0507 12/26/22 0524 12/26/22 0941 12/26/22 1050  HGB 11.2*   < > 8.6*   < > 11.4* 11.9* 11.0*  --   ALBUMIN 2.8*  --   --   --   --   --   --  2.3*  CALCIUM 8.9  --  9.2  --  7.9*  --   --  8.2*  PHOS  --   --  4.5  --   --   --   --   --   CREATININE 3.71*  --  3.72*  --  3.71*  --   --  4.18*  K 3.9   < > 4.3   < > 4.5 4.5  --  4.8   < > = values in this interval not displayed.   Inpatient medications:  arformoterol  15 mcg Nebulization BID    Chlorhexidine Gluconate Cloth  6 each Topical Daily   insulin aspart  0-15 Units Subcutaneous Q4H   latanoprost  1 drop Both Eyes QHS   mouth rinse  15 mL Mouth Rinse Q2H   revefenacin  175 mcg Nebulization Daily   sodium chloride flush  10-40 mL Intracatheter Q12H    sodium chloride     albumin human Stopped (12/26/22 0406)   dexmedetomidine (PRECEDEX) IV infusion 1.2 mcg/kg/hr (12/26/22 1400)   famotidine (PEPCID) IV     fentaNYL infusion INTRAVENOUS 75 mcg/hr (12/26/22 1400)   heparin 800 Units/hr (12/26/22 1400)   norepinephrine (LEVOPHED) Adult infusion 8 mcg/min (12/26/22 1400)   piperacillin-tazobactam (ZOSYN)  IV 2.25 g (12/26/22 1440)   vasopressin Stopped (12/26/22 1319)   albumin human, fentaNYL, heparin, midazolam, mouth rinse, sodium chloride flush

## 2022-12-26 NOTE — Progress Notes (Addendum)
ET Tubes was advanced 3cm per MD request/order from 23 at the lip to 26 at the lip

## 2022-12-26 NOTE — Progress Notes (Addendum)
Initial Nutrition Assessment  DOCUMENTATION CODES:   Non-severe (moderate) malnutrition in context of chronic illness  INTERVENTION:   Recommend start TPN, given moderate malnutrition and bowel in discontinuity.  When able to begin enteral nutrition, recommend: Vital 1.5 with goal rate of 60 ml/h. Prosource TF20 60 ml QID. Provides 2480 kcal, 177 gm protein, 1100 ml free water daily.  NUTRITION DIAGNOSIS:   Moderate Malnutrition related to chronic illness (ESRD on HD) as evidenced by mild muscle depletion, mild fat depletion.  GOAL:   Patient will meet greater than or equal to 90% of their needs  MONITOR:   Vent status, I & O's  REASON FOR ASSESSMENT:   Ventilator, Consult Assessment of nutrition requirement/status  ASSESSMENT:   82 yo male admitted with acute mesenteric ischemia. PMH includes HTN, HLD, CAD, CABG, sleep apnea, ESRD on HD, asbestos exposure.  11/1 - S/P angioplasty and stenting of SMA. 11/1 - S/P ex lap with R hemicolectomy and wound VAC placement, bowel left in discontinuity.  Patient is currently intubated on ventilator support MV: 14.6 L/min Temp (24hrs), Avg:97.9 F (36.6 C), Min:96.3 F (35.7 C), Max:98.4 F (36.9 C)  Propofol: discontinued  Usually receives HD on TTS schedule. Currently requiring CRRT. Plans for return to the OR and hopeful reconnection of bowel on 11/2 or 11/3.   Labs reviewed.  CBG: 237-220  Medications reviewed and include novolog SSI q4h.  Weight history reviewed. Weight stable for the past 6 months.   Patient meets criteria for moderate malnutrition, given mild depletion of muscle and subcutaneous fat mass.  NUTRITION - FOCUSED PHYSICAL EXAM:  Flowsheet Row Most Recent Value  Orbital Region Mild depletion  Upper Arm Region Mild depletion  Thoracic and Lumbar Region No depletion  Buccal Region Unable to assess  Temple Region Mild depletion  Clavicle Bone Region Mild depletion  Clavicle and Acromion Bone  Region No depletion  Scapular Bone Region No depletion  Dorsal Hand Mild depletion  Patellar Region Mild depletion  Anterior Thigh Region Mild depletion  Posterior Calf Region Mild depletion  Edema (RD Assessment) Mild  Hair Reviewed  Eyes Unable to assess  Mouth Unable to assess  Skin Reviewed  Nails Reviewed       Diet Order:   Diet Order             Diet NPO time specified  Diet effective now                   EDUCATION NEEDS:   Not appropriate for education at this time  Skin:  Skin Assessment: Skin Integrity Issues: Skin Integrity Issues:: Wound VAC, Other (Comment) Wound Vac: abdominal incision Other: L ear laceration  Last BM:  PTA  Height:   Ht Readings from Last 1 Encounters:  12/25/22 5\' 9"  (1.753 m)    Weight:   Wt Readings from Last 1 Encounters:  12/25/22 81.2 kg    Ideal Body Weight:  72.7 kg  BMI:  Body mass index is 26.43 kg/m.  Estimated Nutritional Needs:   Kcal:  2200-2400  Protein:  160-200 gm  Fluid:  1 L + UOP   Gabriel Rainwater RD, LDN, CNSC Please refer to Amion for contact information.

## 2022-12-26 NOTE — Anesthesia Procedure Notes (Addendum)
Procedure Name: Intubation Date/Time: 12/25/2022 10:41 PM  Performed by: Tressia Miners, CRNAPre-anesthesia Checklist: Patient identified, Emergency Drugs available, Suction available, Patient being monitored and Timeout performed Patient Re-evaluated:Patient Re-evaluated prior to induction Oxygen Delivery Method: Circle system utilized Preoxygenation: Pre-oxygenation with 100% oxygen Induction Type: IV induction, Rapid sequence and Cricoid Pressure applied Laryngoscope Size: Mac and 4 Grade View: Grade I Tube type: Oral Tube size: 7.5 mm Number of attempts: 1 Airway Equipment and Method: Stylet Placement Confirmation: ETT inserted through vocal cords under direct vision, positive ETCO2 and breath sounds checked- equal and bilateral Secured at: 23 cm Tube secured with: Tape Dental Injury: Teeth and Oropharynx as per pre-operative assessment  Comments: Smooth IV Induction. Eyes taped. RSI Performed. DL x 1 with grade 1 view. Atraumatically placed, teeth and lip remain intact as pre-op. Secured with tape. Bilateral breath sounds +/=, EtCO2 +, Adequate TV, VSS.

## 2022-12-26 NOTE — Op Note (Signed)
DATE OF SERVICE: 12/26/2022  PATIENT:  Peter Oneill  82 y.o. male  PRE-OPERATIVE DIAGNOSIS:  acute mesenteric ischemia  POST-OPERATIVE DIAGNOSIS:  Same  PROCEDURE:   1) ultrasound guided right common femoral artery access 2) aortogram 3) selective superior mesenteric artery angiogram 4) angioplasty and stenting (7x41mm VBX) superior mesenteric artery  SURGEON:  Surgeons and Role:    * Leonie Douglas, MD - Primary  ASSISTANT: none  ANESTHESIA:   general  EBL: minimal from my portion  BLOOD ADMINISTERED:none  DRAINS: none   LOCAL MEDICATIONS USED:  NONE  SPECIMEN:  none  COUNTS: confirmed correct.  TOURNIQUET:  none  PATIENT DISPOSITION:  PACU - hemodynamically stable.   Delay start of Pharmacological VTE agent (>24hrs) due to surgical blood loss or risk of bleeding: no  INDICATION FOR PROCEDURE: [name] is a 82 y.o. male with acute mesenteric ischemia. After careful discussion of risks, benefits, and alternatives the patient was offered endovascular intervention for known superior mesenteric artery stenosis. The patient understood and wished to proceed.  OPERATIVE FINDINGS: >75% stenosis of superior mesenteric artery. Successful stenting with 0% residual stenosis. Flow distally somewhat disadvantaged. Patient is optimized from a vascular standpoint.  DESCRIPTION OF PROCEDURE: After identification of the patient in the pre-operative holding area, the patient was transferred to the operating room. The patient was positioned supine on the operating room table. Anesthesia was induced. The groins were prepped and draped in standard fashion. A surgical pause was performed confirming correct patient, procedure, and operative location.  The right groin was anesthetized with subcutaneous injection of 1% lidocaine. Using ultrasound guidance, the right common femoral artery was accessed with micropuncture technique. Fluoroscopy was used to confirm cannulation over the  femoral head. The 67F sheath was upsized to 53F.   A Benson wire was advanced into the distal aorta. Over the wire an omni flush catheter was advanced to the level of L2. Aortogram was performed: this re-demonstrated celiac occlusion and SMA stenosis.   The decision was made to intervene. The patient was heparinized with 8000 units of heparin. The 53F sheath was exchanged for a 13F x 45cm sheath. Selective angiography of the left lower extremity was performed prior to intervention.   The lesions were treated with: angioplasty and stenting (7x67mm VBX) superior mesenteric artery  Completion angiography revealed:  Resolution of SMA stenosis  A perclose was used to close the arteriotomy. Hemostasis was excellent upon completion.  At this point, the case was turned over to Dr. Azucena Cecil for exploratory laparotomy.   Rande Brunt. Lenell Antu, MD [facility] Phone Number: [phone] 12/26/2022 1:56 AM

## 2022-12-26 NOTE — Progress Notes (Signed)
eLink Physician-Brief Progress Note Patient Name: Peter Oneill DOB: [birth year] MRN: [id number]   Date of Service  12/26/2022  HPI/Events of Note  New patient eval  eICU Interventions     31 M S/P E link Camera in selective superior mesenteric artery angiogram angioplasty and stenting (7x82mm VBX) superior mesenteric artery Vented  Will continue to assess volume status, VAC output and vent managment  Intervention Category Minor Interventions: Other:  Massie Maroon 12/26/2022, 2:29 AM

## 2022-12-26 NOTE — Progress Notes (Addendum)
PHARMACY - ANTICOAGULATION CONSULT NOTE  Pharmacy Consult for Heparin  Indication: Mesenteric ischemia s/p angioplasty/stenting of SMA  Allergies  Allergen Reactions   Atorvastatin Other (See Comments)    Muscle aches    Patient Measurements: Height: 5\' 9"  (175.3 cm) Weight:  (unable to obtain weight d/t malfunction in bed) IBW/kg (Calculated) : 70.7  Vital Signs: Temp: 98.6 F (37 C) (11/01 1516) Temp Source: Esophageal (11/01 1200) BP: 147/49 (11/01 0400) Pulse Rate: 56 (11/01 1516)  Labs: Recent Labs    12/25/22 1603 12/25/22 1815 12/25/22 2311 12/26/22 0229 12/26/22 0232 12/26/22 0300 12/26/22 0507 12/26/22 0524 12/26/22 0941 12/26/22 1050  HGB 11.2*  --    < > 8.6*   < >  --  11.4* 11.9* 11.0*  --   HCT 35.3*  --    < > 25.8*   < >  --  34.3* 35.0* 30.9*  --   PLT 233  --   --  142*  --   --  121*  --  106*  --   APTT  --   --   --   --   --  63*  --   --   --   --   LABPROT  --   --   --   --   --  19.5*  --   --   --   --   INR  --   --   --   --   --  1.6*  --   --   --   --   CREATININE 3.71*  --   --  3.72*  --   --  3.71*  --   --  4.18*  TROPONINIHS 80* 74*  --   --   --   --   --   --   --   --    < > = values in this interval not displayed.    Estimated Creatinine Clearance: 13.9 mL/min (A) (by C-G formula based on SCr of 4.18 mg/dL (H)).   Medical History: Past Medical History:  Diagnosis Date   Asbestosis (HCC)    Contraindication to percutaneous coronary intervention (PCI)    Coronary artery disease    Dyslipidemia    Dyspnea    ESRD on hemodialysis (HCC)    History of anemia    Hypercholesterolemia    Hypertension    Ileus (HCC)    Pleural effusion on left    Pneumonia    Sleep apnea     Assessment: 82 y/o M with mesenteric ischemia s/p OR with vascular and general surgery. S/P angioplasty and stenting of the SMA and right hemi-colectomy.  Heparin was to be started early this morning but this was delayed due to increased wound  VAC output.  Heparin to start now with decreasing VAC output -hg ~ 11  Goal of Therapy:  Heparin level 0.2-0.5 units/ml Monitor platelets by anticoagulation protocol: Yes   Plan:  No boluses Start heparin drip at 800 units/hr Heparin level in 6-8 hours with CBC Monitor closely for bleeding   Harland German, PharmD Clinical Pharmacist **Pharmacist phone directory can now be found on [email or web address] ([email or web address]).  Listed under [facility].

## 2022-12-26 NOTE — Progress Notes (Signed)
Lactate trending down. Pressor needs improved some  BHBA is neg.   Getting 1 liter bolus-->CVP 5 and orthostatic.  Renal contacted  Plan Will cont to trend post-op hemodynamics, cbc and drain out pt since starting IV heparin and look for continued down trend of lactate

## 2022-12-26 NOTE — Progress Notes (Addendum)
NAME:  Peter Oneill, MRN:  [id number], DOB:  30-Mar-1940, LOS: 1 ADMISSION DATE:  12/25/2022, CONSULTATION DATE:  12/26/22 REFERRING MD:  EDP, CHIEF COMPLAINT:  Abdominal Pain   History of Present Illness:  Peter Oneill is a 82 y.o. M with PMH if ESRD, Asbestosis, CAD s/p CABG, HTN, HL, OSA and emphysema followed by Dr. [name] who presented to the ED with acutely worsening abdominal pain that began the day of admission.  He underwent dialysis in the morning and then felt short of breath followed by abdominal discomfort.  His initial oxygen saturation was 70% on EMS arrival and his work-up in the ED included a CT Abd/pelvis concerning for acute mesenteric ischemia secondary to occlusion of the celiac artery and significant stenosis of the superior mesenteric artery and small inferior mesenteric artery.  His labs showed a lactic acid of 1.4, WBC 6.3, creatinine 3.7, BNP >1300, normal Hgb.  He was taken emergently to the OR for urgent revascularization and SMA stenting along with ex-lap.  PCCM asked to admit  Pertinent  Medical History   has a past medical history of Asbestosis (HCC), Contraindication to percutaneous coronary intervention (PCI), Coronary artery disease, Dyslipidemia, Dyspnea, History of anemia, Hypercholesterolemia, Hypertension, Ileus (HCC), Pleural effusion on left, Pneumonia, Renal failure, and Sleep apnea.   Significant Hospital Events: Including procedures, antibiotic start and stop dates in addition to other pertinent events   10/31 presented with mesenteric ischemia, occlusion of the celiac and significant stenosis of the SMA and small inferior mesenteric artery. Went to OR for urgent revascularization and SMA stenting along with ex-lap. Right hemicolectomy for necrotic cecum. Significant pressor requirement and bleeding (800 cc 1st hour) from abdominal wound vac post-op, given 4L PRBC's, 500cc albumin, 500cc NS and was on Levophed , Neo , Vaso 0.04 and Epi .  Pressors were titrated down with resuscitation.  11/1. CVL placed for pressors. LE warm . Weaning pressors. Orthostatic w/ positional change. 2.2 liters positive. Changing prop to dex. Drain out decreased   Interim History / Subjective:  Weaning pressors hgb stable  Objective   Blood pressure (!) 147/49, pulse 62, temperature 98.4 F (36.9 C), resp. rate (!) 28, height 5\' 9"  (1.753 m), weight 81.2 kg, SpO2 100%.    Vent Mode: PRVC FiO2 (%):  [40 %-100 %] 40 % Set Rate:  [16 bmp] 16 bmp Vt Set:  [560 mL] 560 mL PEEP:  [5 cmH20] 5 cmH20 Plateau Pressure:  [23 cmH20-24 cmH20] 23 cmH20   Intake/Output Summary (Last 24 hours) at 12/26/2022 0936 Last data filed at 12/26/2022 0900 Gross per 24 hour  Intake 4906.21 ml  Output 2650 ml  Net 2256.21 ml   Filed Weights   12/25/22 1807  Weight: 81.2 kg  General 82 year old male patient currently heavily sedated on fentanyl HEENT normal cephalic atraumatic, orally intubated right IJ catheter unremarkable Pulmonary clear to auscultation no accessory use  PCXR w/ ett and CVL good position. Chronic scarring. No acute process noted.  Cardiac: Regular rate and rhythm, bradycardic at times Abdomen: Hypoactive, dressing clean dry and intact, wound VAC w/ decreased bloody output put out about 200cc since 7am but only about 25cc of that over last hr Ext warm pulses palp  GU anuric Neuro sedated. Did not have gag earlier. Now starting to respond some w/ grimace to noxious stim  Resolved Hospital Problem list     Assessment & Plan:    Acute hemorrhagic and vasoplegic shock w/ lactic acidosis. Status post SMA stenting and exploratory  lap with Right hemicolectomy for acute Mesenteric ischemia, occlusion of the celiac artery and significant stenosis of the superior mesenteric artery and small inferior mesenteric artery and Ischemic gut. Bowel left in discontinuity on 10/31 Status post multiple blood products.  Hemodynamics slowly improving. Hgb 11.4. vac  output decreasing  Current I&O balance: +2.2 liters  Plan Off epi will Stop vasopressin  Cont to wean NE for MAP > 65 Check CVP, nursing reports orthostatic changes w/HOB elevated. Will give bolus of LR if CVP < 10 Check cortisol. As pressor requirements improving no urgent need for stress dose steroids NPO. Bowel left in Discontinuity. Diet TBD by surgical team, suspect will prob need TPN  Transfuse for Hgb < 7 or if evidence of active bleeding w/ decline in hemodynamics IV heparin for SMA stent (spoke w/ surgical team)  Day 2 of zosyn. LOT still TBD Will go back to OR for re-exploration and anastomosis of bowel at discretion of surgical team  Repeat lactic acid Watch drain output closely, this has tapered off  Serial CBCs repeat 4pm tonight (scheduled q 6, will leave it that way as starting heparin)  Expected post-operative thrombocytopenia  Plan Monitor   Mild coagulopathy  Plan Cont to monitor  FFP ordered earlier   Acute Hypoxic Respiratory Failure and post-op vent management superimposed on chronic asbestosis w/ chronic scarring as well as emphysema and OSA -the respiratory failure was likely exacerbated by acute gut ischemia and baseline inability to compensated  -am abg reviewed  Plan Cont full vent support  PAD protocol  RASS goal -2 Will hold off from weaning given he will go back to OR Cont fent gtt. Currently on prop. Can change to dex as will likely need TPN AM CXR VAP bundle  Scheduled nebulized LABA/ICS  ESRD w/ Fluid, electrolyte and acid base disturbance: Now w/ mixed NAG and anion gap metabolic acidosis, rising K and dropping serum bicarb.  -dialyzed 10/30, has LUE fistula -Trialysis catheter placed in the event needs CRRT Plan Repeat lactate as above Check beta-hydroxybutyric acid  Will message Renal  Hyperglycemia Plan Ssi   Best Practice (right click and "Reselect all SmartList Selections" daily)   Diet/type: NPO DVT prophylaxis:  SCD-->eventually start IV heparin when OK w/ general surg  GI prophylaxis: H2B Lines: Central line Foley:  Yes, and it is still needed Code Status:  full code Last date of multidisciplinary goals of care discussion [pending]  Critical care time: 45 min      CRITICAL CARE Performed by: Shelby Mattocks

## 2022-12-26 NOTE — Progress Notes (Signed)
Afternoon rounds  He has put pit another from the drain since 9am  Still on norepi  CVP 4 after getting first liter AND keeping 50cc ahead w/ CRRT. Lactate now 2.1 Increased Ectopy w/ AF and then freq PVCs. Started amio w/ improved Ventricular rhythm  Random cort 35 so will not add stress dose steroids  Exam  Sedated on vent  HENT NCAT  Pulm dec bases. ABG reviewed had sig resp alk. Changed VT to 7cc/kg took freq down to 10 w/ plan to let him set his own Ve Card now af intermittent wide complex RVR Abd soft. Drain still w/ bloody output as noted above.  Neuro sedated. Currently on precedex and fent. Has needed additional bolus of sedation   Impression/plan On-going septic shock w/ lactic acidosis. At this point hemorrhage and acidosis related vasoplegia seemingly improved Afib w/ wide complex RVR-->better after amio Iatrogenic resp alk. ->Ve reduced w/ dec'd VT and rr  Plan Dec RR to 10 (let him set his own rate) dec Vt to 7 cc/kg/PBW (done) 1 liter LR now. (Don't add to CRRT numbers After liter complete and 1 hr after vent changes repeat ABG and lactate  F/u cbc-->should be one around 5 Cont to monitor drain output  Amio bolus administered. Cont infusion  Note already on heparin for his SMA stent   Additional time 35 minutes Simonne Martinet ACNP-BC [facility] Pulmonary/Critical Care Pager # [phone] OR # [phone] if no answer

## 2022-12-26 NOTE — Progress Notes (Signed)
Hgb trending down now down to 8.8, has put out about 25 ml since last visit.  Hemodynamics still stable  Plan Cont current plan of care Will also check H&H again at 10pm today   Simonne Martinet ACNP-BC [facility] Pulmonary/Critical Care Pager # [phone] OR # [phone] if no answer

## 2022-12-26 NOTE — Progress Notes (Signed)
General Surgery Follow Up Note  Subjective:    Overnight Issues:   Objective:  Vital signs for last 24 hours: Temp:  [96.3 F (35.7 C)-98.4 F (36.9 C)] 98.1 F (36.7 C) (11/01 1245) Pulse Rate:  [49-118] 54 (11/01 1245) Resp:  [16-29] 26 (11/01 1245) BP: (109-150)/(39-63) 147/49 (11/01 0400) SpO2:  [99 %-100 %] 100 % (11/01 1245) Arterial Line BP: (110-172)/(33-53) 162/41 (11/01 1245) FiO2 (%):  [40 %-100 %] 40 % (11/01 1033) Weight:  [81.2 kg] 81.2 kg (10/31 1807)  Hemodynamic parameters for last 24 hours: CVP:  [5 mmHg-8 mmHg] 5 mmHg  Intake/Output from previous day: 10/31 0701 - 11/01 0700 In: 4650.1 [I.V.:1798.9; Blood:1089; IV Piggyback:1762.2] Out: 2500 [Drains:1500; Blood:1000]  Intake/Output this shift: Total I/O In: 1452.7 [I.V.:247.5; IV Piggyback:1205.2] Out: 200 [Drains:200]  Vent settings for last 24 hours: Vent Mode: PRVC FiO2 (%):  [40 %-100 %] 40 % Set Rate:  [16 bmp] 16 bmp Vt Set:  [560 mL] 560 mL PEEP:  [5 cmH20] 5 cmH20 Plateau Pressure:  [23 cmH20-24 cmH20] 23 cmH20  Physical Exam:  Gen: comfortable, no distress Neuro: follows commands, alert, communicative HEENT: PERRL Neck: supple CV: RRR Pulm: unlabored breathing on mechanical ventilation Abd: soft, NT, midline vac in place , bloody o/p, but slowing GU: anuric  Extr: wwp, no edema  Results for orders placed or performed during the hospital encounter of 12/25/22 (from the past 24 hour(s))  CBC     Status: Abnormal   Collection Time: 12/25/22  4:03 PM  Result Value Ref Range   WBC 6.3 4.0 - 10.5 K/uL   RBC 3.53 (L) 4.22 - 5.81 MIL/uL   Hemoglobin 11.2 (L) 13.0 - 17.0 g/dL   HCT 16.1 (L) 09.6 - 04.5 %   MCV 100.0 80.0 - 100.0 fL   MCH 31.7 26.0 - 34.0 pg   MCHC 31.7 30.0 - 36.0 g/dL   RDW 40.9 (H) 81.1 - 91.4 %   Platelets 233 150 - 400 K/uL   nRBC 0.0 0.0 - 0.2 %  Comprehensive metabolic panel     Status: Abnormal   Collection Time: 12/25/22  4:03 PM  Result Value Ref Range    Sodium 138 135 - 145 mmol/L   Potassium 3.9 3.5 - 5.1 mmol/L   Chloride 95 (L) 98 - 111 mmol/L   CO2 30 22 - 32 mmol/L   Glucose, Bld 179 (H) 70 - 99 mg/dL   BUN 26 (H) 8 - 23 mg/dL   Creatinine, Ser 7.82 (H) 0.61 - 1.24 mg/dL   Calcium 8.9 8.9 - 95.6 mg/dL   Total Protein 8.3 (H) 6.5 - 8.1 g/dL   Albumin 2.8 (L) 3.5 - 5.0 g/dL   AST 48 (H) 15 - 41 U/L   ALT 40 0 - 44 U/L   Alkaline Phosphatase 218 (H) 38 - 126 U/L   Total Bilirubin 0.9 0.3 - 1.2 mg/dL   GFR, Estimated 16 (L) >60 mL/min   Anion gap 13 5 - 15  Lipase, blood     Status: None   Collection Time: 12/25/22  4:03 PM  Result Value Ref Range   Lipase 41 11 - 51 U/L  Troponin I (High Sensitivity)     Status: Abnormal   Collection Time: 12/25/22  4:03 PM  Result Value Ref Range   Troponin I (High Sensitivity) 80 (H) <18 ng/L  Brain natriuretic peptide     Status: Abnormal   Collection Time: 12/25/22  4:03 PM  Result  Value Ref Range   B Natriuretic Peptide 1,345.2 (H) 0.0 - 100.0 pg/mL  Lactic acid, plasma     Status: None   Collection Time: 12/25/22  6:15 PM  Result Value Ref Range   Lactic Acid, Venous 1.4 0.5 - 1.9 mmol/L  Troponin I (High Sensitivity)     Status: Abnormal   Collection Time: 12/25/22  6:15 PM  Result Value Ref Range   Troponin I (High Sensitivity) 74 (H) <18 ng/L  Type and screen [facility]     Status: None (Preliminary result)   Collection Time: 12/25/22 11:00 PM  Result Value Ref Range   ABO/RH(D) O POS    Antibody Screen NEG    Sample Expiration 12/28/2022,2359    Unit Number [id number]    Blood Component Type RED CELLS,LR    Unit division 00    Status of Unit ISSUED    Transfusion Status OK TO TRANSFUSE    Crossmatch Result Compatible    Unit Number [id number]    Blood Component Type RED CELLS,LR    Unit division 00    Status of Unit ISSUED    Transfusion Status OK TO TRANSFUSE    Crossmatch Result Compatible    Unit Number [id number]    Blood Component  Type RED CELLS,LR    Unit division 00    Status of Unit ALLOCATED    Transfusion Status OK TO TRANSFUSE    Crossmatch Result Compatible    Unit Number [id number]    Blood Component Type RED CELLS,LR    Unit division 00    Status of Unit ALLOCATED    Transfusion Status OK TO TRANSFUSE    Crossmatch Result Compatible    Unit Number [id number]    Blood Component Type RED CELLS,LR    Unit division 00    Status of Unit ISSUED    Transfusion Status OK TO TRANSFUSE    Crossmatch Result Compatible    Unit Number [id number]    Blood Component Type RED CELLS,LR    Unit division 00    Status of Unit ISSUED    Transfusion Status OK TO TRANSFUSE    Crossmatch Result Compatible    Unit Number [id number]    Blood Component Type RED CELLS,LR    Unit division 00    Status of Unit ISSUED    Transfusion Status OK TO TRANSFUSE    Crossmatch Result Compatible    Unit Number [id number]    Blood Component Type RED CELLS,LR    Unit division 00    Status of Unit ISSUED    Transfusion Status OK TO TRANSFUSE    Crossmatch Result      Compatible Performed at [facility], [address], [place], [place] [address]   ABO/Rh     Status: None   Collection Time: 12/25/22 11:05 PM  Result Value Ref Range   ABO/RH(D)      O POS Performed at [facility], [address], [place], [place] [address]   I-STAT 7, (LYTES, BLD GAS, ICA, H+H)     Status: Abnormal   Collection Time: 12/25/22 11:11 PM  Result Value Ref Range   pH, Arterial 7.393 7.35 - 7.45   pCO2 arterial 49.1 (H) 32 - 48 mmHg   pO2, Arterial 546 (H) 83 - 108 mmHg   Bicarbonate 29.9 (H) 20.0 - 28.0 mmol/L   TCO2 31 22 - 32 mmol/L   O2 Saturation 100 %   Acid-Base Excess 4.0 (  H) 0.0 - 2.0 mmol/L   Sodium 137 135 - 145 mmol/L   Potassium 4.2 3.5 - 5.1 mmol/L   Calcium, Ion 1.09 (L) 1.15 - 1.40 mmol/L   HCT 33.0 (L) 39.0 - 52.0 %   Hemoglobin 11.2 (L) 13.0 - 17.0 g/dL   Sample type ARTERIAL   Prepare RBC  (crossmatch)     Status: None   Collection Time: 12/26/22 12:58 AM  Result Value Ref Range   Order Confirmation      ORDER PROCESSED BY BLOOD BANK Performed at [facility], [address], [place], [place] [address]   I-STAT 7, (LYTES, BLD GAS, ICA, H+H)     Status: Abnormal   Collection Time: 12/26/22  1:04 AM  Result Value Ref Range   pH, Arterial 7.464 (H) 7.35 - 7.45   pCO2 arterial 35.6 32 - 48 mmHg   pO2, Arterial 198 (H) 83 - 108 mmHg   Bicarbonate 25.8 20.0 - 28.0 mmol/L   TCO2 27 22 - 32 mmol/L   O2 Saturation 100 %   Acid-Base Excess 2.0 0.0 - 2.0 mmol/L   Sodium 138 135 - 145 mmol/L   Potassium 4.1 3.5 - 5.1 mmol/L   Calcium, Ion 1.07 (L) 1.15 - 1.40 mmol/L   HCT 28.0 (L) 39.0 - 52.0 %   Hemoglobin 9.5 (L) 13.0 - 17.0 g/dL   Patient temperature 02.7 C    Sample type ARTERIAL   Basic metabolic panel     Status: Abnormal   Collection Time: 12/26/22  2:29 AM  Result Value Ref Range   Sodium 137 135 - 145 mmol/L   Potassium 4.3 3.5 - 5.1 mmol/L   Chloride 107 98 - 111 mmol/L   CO2 21 (L) 22 - 32 mmol/L   Glucose, Bld 166 (H) 70 - 99 mg/dL   BUN 30 (H) 8 - 23 mg/dL   Creatinine, Ser 2.53 (H) 0.61 - 1.24 mg/dL   Calcium 9.2 8.9 - 66.4 mg/dL   GFR, Estimated 16 (L) >60 mL/min   Anion gap 9 5 - 15  Phosphorus     Status: None   Collection Time: 12/26/22  2:29 AM  Result Value Ref Range   Phosphorus 4.5 2.5 - 4.6 mg/dL  Magnesium     Status: None   Collection Time: 12/26/22  2:29 AM  Result Value Ref Range   Magnesium 1.9 1.7 - 2.4 mg/dL  Lactic acid, plasma     Status: Abnormal   Collection Time: 12/26/22  2:29 AM  Result Value Ref Range   Lactic Acid, Venous 2.0 (HH) 0.5 - 1.9 mmol/L  CBC     Status: Abnormal   Collection Time: 12/26/22  2:29 AM  Result Value Ref Range   WBC 8.5 4.0 - 10.5 K/uL   RBC 2.75 (L) 4.22 - 5.81 MIL/uL   Hemoglobin 8.6 (L) 13.0 - 17.0 g/dL   HCT 40.3 (L) 47.4 - 25.9 %   MCV 93.8 80.0 - 100.0 fL   MCH 31.3 26.0 - 34.0 pg    MCHC 33.3 30.0 - 36.0 g/dL   RDW 56.3 (H) 87.5 - 64.3 %   Platelets 142 (L) 150 - 400 K/uL   nRBC 0.0 0.0 - 0.2 %  Glucose, capillary     Status: Abnormal   Collection Time: 12/26/22  2:30 AM  Result Value Ref Range   Glucose-Capillary 175 (H) 70 - 99 mg/dL  I-STAT 7, (LYTES, BLD GAS, ICA, H+H)  Status: Abnormal   Collection Time: 12/26/22  2:32 AM  Result Value Ref Range   pH, Arterial 7.436 7.35 - 7.45   pCO2 arterial 31.5 (L) 32 - 48 mmHg   pO2, Arterial 418 (H) 83 - 108 mmHg   Bicarbonate 21.4 20.0 - 28.0 mmol/L   TCO2 22 22 - 32 mmol/L   O2 Saturation 100 %   Acid-base deficit 3.0 (H) 0.0 - 2.0 mmol/L   Sodium 139 135 - 145 mmol/L   Potassium 4.4 3.5 - 5.1 mmol/L   Calcium, Ion 1.26 1.15 - 1.40 mmol/L   HCT 26.0 (L) 39.0 - 52.0 %   Hemoglobin 8.8 (L) 13.0 - 17.0 g/dL   Patient temperature 98.1 F    Sample type ARTERIAL   Hemoglobin A1c     Status: None   Collection Time: 12/26/22  2:33 AM  Result Value Ref Range   Hgb A1c MFr Bld 5.4 4.8 - 5.6 %   Mean Plasma Glucose 108.28 mg/dL  MRSA Next Gen by PCR, Nasal     Status: None   Collection Time: 12/26/22  2:33 AM   Specimen: Nasal Mucosa; Nasal Swab  Result Value Ref Range   MRSA by PCR Next Gen NOT DETECTED NOT DETECTED  Prepare RBC (crossmatch)     Status: None   Collection Time: 12/26/22  2:51 AM  Result Value Ref Range   Order Confirmation      ORDER PROCESSED BY BLOOD BANK Performed at [facility], [address], [place], [place] [address]   Fibrinogen     Status: None   Collection Time: 12/26/22  3:00 AM  Result Value Ref Range   Fibrinogen 327 210 - 475 mg/dL  Protime-INR     Status: Abnormal   Collection Time: 12/26/22  3:00 AM  Result Value Ref Range   Prothrombin Time 19.5 (H) 11.4 - 15.2 seconds   INR 1.6 (H) 0.8 - 1.2  APTT     Status: Abnormal   Collection Time: 12/26/22  3:00 AM  Result Value Ref Range   aPTT 63 (H) 24 - 36 seconds  Prepare RBC (crossmatch)     Status: None    Collection Time: 12/26/22  3:10 AM  Result Value Ref Range   Order Confirmation      ORDER PROCESSED BY BLOOD BANK Performed at [facility], [address], [place], [place] [address]   Glucose, capillary     Status: Abnormal   Collection Time: 12/26/22  4:59 AM  Result Value Ref Range   Glucose-Capillary 237 (H) 70 - 99 mg/dL  Lactic acid, plasma     Status: Abnormal   Collection Time: 12/26/22  5:07 AM  Result Value Ref Range   Lactic Acid, Venous 4.2 (HH) 0.5 - 1.9 mmol/L  CBC     Status: Abnormal   Collection Time: 12/26/22  5:07 AM  Result Value Ref Range   WBC 10.3 4.0 - 10.5 K/uL   RBC 3.77 (L) 4.22 - 5.81 MIL/uL   Hemoglobin 11.4 (L) 13.0 - 17.0 g/dL   HCT 21.3 (L) 08.6 - 57.8 %   MCV 91.0 80.0 - 100.0 fL   MCH 30.2 26.0 - 34.0 pg   MCHC 33.2 30.0 - 36.0 g/dL   RDW 46.9 (H) 62.9 - 52.8 %   Platelets 121 (L) 150 - 400 K/uL   nRBC 0.2 0.0 - 0.2 %  Basic metabolic panel     Status: Abnormal   Collection  Time: 12/26/22  5:07 AM  Result Value Ref Range   Sodium 138 135 - 145 mmol/L   Potassium 4.5 3.5 - 5.1 mmol/L   Chloride 108 98 - 111 mmol/L   CO2 17 (L) 22 - 32 mmol/L   Glucose, Bld 242 (H) 70 - 99 mg/dL   BUN 29 (H) 8 - 23 mg/dL   Creatinine, Ser 2.95 (H) 0.61 - 1.24 mg/dL   Calcium 7.9 (L) 8.9 - 10.3 mg/dL   GFR, Estimated 16 (L) >60 mL/min   Anion gap 13 5 - 15  Magnesium     Status: None   Collection Time: 12/26/22  5:07 AM  Result Value Ref Range   Magnesium 2.1 1.7 - 2.4 mg/dL  I-STAT 7, (LYTES, BLD GAS, ICA, H+H)     Status: Abnormal   Collection Time: 12/26/22  5:24 AM  Result Value Ref Range   pH, Arterial 7.327 (L) 7.35 - 7.45   pCO2 arterial 33.4 32 - 48 mmHg   pO2, Arterial 182 (H) 83 - 108 mmHg   Bicarbonate 17.7 (L) 20.0 - 28.0 mmol/L   TCO2 19 (L) 22 - 32 mmol/L   O2 Saturation 100 %   Acid-base deficit 8.0 (H) 0.0 - 2.0 mmol/L   Sodium 139 135 - 145 mmol/L   Potassium 4.5 3.5 - 5.1 mmol/L   Calcium, Ion 1.06 (L) 1.15 - 1.40 mmol/L    HCT 35.0 (L) 39.0 - 52.0 %   Hemoglobin 11.9 (L) 13.0 - 17.0 g/dL   Patient temperature 28.4 C    Sample type ARTERIAL   Prepare fresh frozen plasma     Status: None (Preliminary result)   Collection Time: 12/26/22  5:30 AM  Result Value Ref Range   Unit Number [id number]    Blood Component Type THW PLS APHR    Unit division 00    Status of Unit ISSUED    Transfusion Status      OK TO TRANSFUSE Performed at [facility], [address], [place], [place] [address]   Global TEG Panel     Status: None   Collection Time: 12/26/22  7:59 AM  Result Value Ref Range   Citrated Kaolin (R) 6.0 4.6 - 9.1 min   Citrated Kaolin (K) 1.4 0.8 - 2.1 min   Citrated Kaolin Angle 70.9 63 - 78 deg   Citrated Kaolin (MA) 58.9 52 - 69 mm   Citrated Rapid TEG (MA) 57.5 52 - 70 mm   CK with Heparinase (R) 5.4 4.3 - 8.3 min   CFF Max Amplitude 19.5 15 - 32 mm   Citrated Functional Fibrinogen 355.8 278 - 581 mg/dL  Glucose, capillary     Status: Abnormal   Collection Time: 12/26/22  7:59 AM  Result Value Ref Range   Glucose-Capillary 220 (H) 70 - 99 mg/dL  CBC     Status: Abnormal   Collection Time: 12/26/22  9:41 AM  Result Value Ref Range   WBC 10.1 4.0 - 10.5 K/uL   RBC 3.52 (L) 4.22 - 5.81 MIL/uL   Hemoglobin 11.0 (L) 13.0 - 17.0 g/dL   HCT 34.7 (L) 42.5 - 95.6 %   MCV 87.8 80.0 - 100.0 fL   MCH 31.3 26.0 - 34.0 pg   MCHC 35.6 30.0 - 36.0 g/dL   RDW 38.7 (H) 56.4 - 33.2 %   Platelets 106 (L) 150 - 400 K/uL   nRBC 0.0 0.0 - 0.2 %  Lactic acid, plasma  Status: Abnormal   Collection Time: 12/26/22 10:28 AM  Result Value Ref Range   Lactic Acid, Venous 2.7 (HH) 0.5 - 1.9 mmol/L  Beta-hydroxybutyric acid     Status: None   Collection Time: 12/26/22 10:28 AM  Result Value Ref Range   Beta-Hydroxybutyric Acid 0.14 0.05 - 0.27 mmol/L  Cortisol     Status: None   Collection Time: 12/26/22 10:35 AM  Result Value Ref Range   Cortisol, Plasma 35.1 ug/dL  Comprehensive metabolic  panel     Status: Abnormal   Collection Time: 12/26/22 10:50 AM  Result Value Ref Range   Sodium 135 135 - 145 mmol/L   Potassium 4.8 3.5 - 5.1 mmol/L   Chloride 105 98 - 111 mmol/L   CO2 18 (L) 22 - 32 mmol/L   Glucose, Bld 189 (H) 70 - 99 mg/dL   BUN 34 (H) 8 - 23 mg/dL   Creatinine, Ser 0.98 (H) 0.61 - 1.24 mg/dL   Calcium 8.2 (L) 8.9 - 10.3 mg/dL   Total Protein 5.0 (L) 6.5 - 8.1 g/dL   Albumin 2.3 (L) 3.5 - 5.0 g/dL   AST 54 (H) 15 - 41 U/L   ALT 36 0 - 44 U/L   Alkaline Phosphatase 72 38 - 126 U/L   Total Bilirubin 2.5 (H) 0.3 - 1.2 mg/dL   GFR, Estimated 14 (L) >60 mL/min   Anion gap 12 5 - 15  Glucose, capillary     Status: Abnormal   Collection Time: 12/26/22 11:27 AM  Result Value Ref Range   Glucose-Capillary 187 (H) 70 - 99 mg/dL    Assessment & Plan: The plan of care was discussed with the bedside nurse for the day, who is in agreement with this plan and no additional concerns were raised.   Present on Admission:  Acute mesenteric ischemia (HCC)    LOS: 1 day   Additional comments:I reviewed the patient's new clinical lab test results.   and I reviewed the patients new imaging test results.    Acute mesenteric ischemia - s/p exlap, R hemicolectomy by Dr. Azucena Cecil early AM today. Left in discontinuity with abthera. Also underwent revascularization with angioplasty of SMA by Dr. Lenell Antu. Plan takeback to to OR 11/2 vs 11/3 pending clinical stability. Okay to start heparin gtt as hgb has stabilized.  FEN - strict NPO, no meds/feeds due to intestinal discontinuity. Recommend volume resuscitation with LR bolus and initiation of LR MIVF. Recommend CRRT for clearance, no volume removal yet.  DVT - SCDs,  heparin gtt as above Dispo - ICU    Diamantina Monks, MD Trauma & General Surgery Please use [email or web address] to contact on call provider  12/26/2022  *Care during the described time interval was provided by me. I have reviewed this patient's available data, including  medical history, events of note, physical examination and test results as part of my evaluation.

## 2022-12-26 NOTE — Transfer of Care (Addendum)
Immediate Anesthesia Transfer of Care Note  Patient: Jovanie Verge  Procedure(s) Performed: MESENTERIC  STENT RIGHT HEMICOLECTOMY (Right: Abdomen) AORTOGRAM (Right: Groin) ULTRASOUND GUIDANCE FOR VASCULAR ACCESS (Right: Groin) APPLICATION OF WOUND VAC (Abdomen)  Patient Location: ICU  Anesthesia Type:General  Level of Consciousness: Patient remains intubated per anesthesia plan  Airway & Oxygen Therapy: Patient remains intubated per anesthesia plan and Patient placed on Ventilator (see vital sign flow sheet for setting)  Post-op Assessment: Report given to RN and Post -op Vital signs reviewed and stable  Post vital signs: Reviewed Patient is requiring multiple vasopressors. Team aware.   Last Vitals:  Vitals Value Taken Time  BP 101/61 12/26/22 0215  Temp    Pulse 92 12/26/22 0239  Resp 24 12/26/22 0240  SpO2 100 % 12/26/22 0239  Vitals shown include unfiled device data.  Last Pain:  Vitals:   12/25/22 2115  TempSrc:   PainSc: 5          Complications: No notable events documented.

## 2022-12-26 NOTE — Progress Notes (Signed)
Received pt. from OR. Rapidly declined. Gave 4 units PRBC and 2 albumins.  1: unit [id number] s start/end at 0315/0344 2: unit [id number] start/end at 0326/0400 3: unit [id number] x start/end at 0359/0508 4: unit [id number] l start/end at 0501/0548

## 2022-12-26 NOTE — Progress Notes (Signed)
PHARMACY NOTE:  ANTIMICROBIAL RENAL DOSAGE ADJUSTMENT  Current antimicrobial regimen includes a mismatch between antimicrobial dosage and estimated renal function.  As per policy approved by the Pharmacy & Therapeutics and Medical Executive Committees, the antimicrobial dosage will be adjusted accordingly.  Current antimicrobial dosage:  piperacillin/tazobactam 2.25 q8hr over 30 min  Indication: abdominal mesenteric ischemia  Renal Function:  Estimated Creatinine Clearance: 13.9 mL/min (A) (by C-G formula based on SCr of 4.18 mg/dL (H)). []      On intermittent HD, scheduled: [x]      On CRRT - new start 11/1     Antimicrobial dosage has been changed to:  piperacillin/tazobactam 3.375g Q8hr EI  Additional comments: discussed IV med compatibility and line access with RN   Thank you for allowing pharmacy to be a part of this patient's care.  Alphia Moh, PharmD, BCPS, BCCP Clinical Pharmacist  Please check AMION for all [facility] Pharmacy phone numbers After 10:00 PM, call Main Pharmacy [phone]

## 2022-12-26 NOTE — Anesthesia Postprocedure Evaluation (Signed)
Anesthesia Post Note  Patient: Peter Oneill  Procedure(s) Performed: MESENTERIC  STENT AORTOGRAM (Right: Groin) ULTRASOUND GUIDANCE FOR VASCULAR ACCESS (Right: Groin) APPLICATION OF WOUND VAC (Abdomen) EXPLORATORY LAPAROTOMY; RIGHT HEMICOLECTOMY: HEPATIC FLEXURE MOBILIZATION (Abdomen)     Patient location during evaluation: SICU Anesthesia Type: General Level of consciousness: sedated Pain management: pain level controlled Vital Signs Assessment: post-procedure vital signs reviewed and stable Respiratory status: patient remains intubated per anesthesia plan Cardiovascular status: stable Postop Assessment: no apparent nausea or vomiting Anesthetic complications: no   No notable events documented.  Last Vitals:  Vitals:   12/26/22 0645 12/26/22 0700  BP:    Pulse: 94 75  Resp: (!) 22 (!) 25  Temp: 36.5 C 36.6 C  SpO2: 100% 100%    Last Pain:  Vitals:   12/26/22 1610  TempSrc: Esophageal  PainSc:                  Kennieth Rad

## 2022-12-26 NOTE — Anesthesia Procedure Notes (Addendum)
Arterial Line Insertion Start/End10/31/2024 10:45 PM, 12/25/2022 10:53 PM Performed by: Tressia Miners, CRNA, CRNA  Patient location: OR. Preanesthetic checklist: patient identified, IV checked, site marked, risks and benefits discussed, surgical consent, monitors and equipment checked, pre-op evaluation, timeout performed and anesthesia consent Right, radial was placed Catheter size: 20 G Hand hygiene performed , maximum sterile barriers used  and Seldinger technique used Allen's test indicative of satisfactory collateral circulation Attempts: 1 Procedure performed without using ultrasound guided technique. Following insertion, dressing applied and Biopatch. Post procedure assessment: normal and unchanged  Patient tolerated the procedure well with no immediate complications.

## 2022-12-26 NOTE — Progress Notes (Addendum)
  Progress Note    12/26/2022 8:06 AM 1 Day Post-Op  Subjective:  intubated and sedated   Vitals:   12/26/22 0745 12/26/22 0800  BP:    Pulse: 71 70  Resp: (!) 26 (!) 26  Temp: 98.1 F (36.7 C) 98.2 F (36.8 C)  SpO2: 100% 100%   Physical Exam: Lungs:  mechanical ventilation Incisions:  R groin groin access without hematoma Extremities:  feet warm and well perfused Abdomen:  bright red blood in cannister Neurologic: sedated  CBC    Component Value Date/Time   WBC 10.3 12/26/2022 0507   RBC 3.77 (L) 12/26/2022 0507   HGB 11.9 (L) 12/26/2022 0524   HGB 11.4 (L) 07/12/2021 1057   HCT 35.0 (L) 12/26/2022 0524   HCT 33.5 (L) 07/12/2021 1057   PLT 121 (L) 12/26/2022 0507   PLT 212 07/12/2021 1057   MCV 91.0 12/26/2022 0507   MCV 90 07/12/2021 1057   MCH 30.2 12/26/2022 0507   MCHC 33.2 12/26/2022 0507   RDW 16.1 (H) 12/26/2022 0507   RDW 15.8 (H) 07/12/2021 1057   LYMPHSABS 0.4 (L) 05/13/2021 0943   MONOABS 0.3 05/13/2021 0943   EOSABS 0.1 05/13/2021 0943   BASOSABS 0.0 05/13/2021 0943    BMET    Component Value Date/Time   NA 139 12/26/2022 0524   K 4.5 12/26/2022 0524   CL 108 12/26/2022 0507   CO2 17 (L) 12/26/2022 0507   GLUCOSE 242 (H) 12/26/2022 0507   BUN 29 (H) 12/26/2022 0507   CREATININE 3.71 (H) 12/26/2022 0507   CALCIUM 7.9 (L) 12/26/2022 0507   CALCIUM 8.2 (L) 05/15/2021 0750   GFRNONAA 16 (L) 12/26/2022 0507    INR    Component Value Date/Time   INR 1.6 (H) 12/26/2022 0300     Intake/Output Summary (Last 24 hours) at 12/26/2022 0807 Last data filed at 12/26/2022 0700 Gross per 24 hour  Intake 4650.1 ml  Output 2500 ml  Net 2150.1 ml     Assessment/Plan:  82 y.o. male is s/p SMA stenting for acute mesenteric ischemia 1 Day Post-Op   R groin access site without hematoma BLE warm and well perfused Call vascular with questions   Emilie Rutter, PA-C [facility] [phone] 12/26/2022 8:07 AM  VASCULAR  STAFF ADDENDUM: I have independently interviewed and examined the patient. I agree with the above. Maximally revascularized - good technical result from stenting.  Please call with questions over the weekend. I will check in Monday.  Rande Brunt. Lenell Antu, MD [facility] Phone Number: [phone] 12/26/2022 8:38 AM

## 2022-12-26 NOTE — Progress Notes (Signed)
   12/26/22 0827  Daily Weaning Assessment  Daily Assessment of Readiness to Wean Wean protocol criteria met (SBT performed)  SBT Method CPAP 5 cm H20 and PS 5 cm H20 (PS 10)  Weaning Start Time 0827  Patient response Failed SBT terminated  Reason SBT Terminated RR > 35 breaths/min or Frequency/TV ratio >105   Pt was placed on PS/CPAP 10/5 40% but did not tolerate due to increase RR> 38 and low Vt. Pt was placed back on full support at this time.

## 2022-12-26 NOTE — Op Note (Addendum)
Preoperative diagnosis: acute mesenteric ischemia  Postoperative diagnosis: acute mesenteric ischemia with necrotic cecum  Procedure: 1. Exploratory laparotomy  2. Right hemicolectomy 3. Hepatic flexure mobilization 4. Application of wound vac  Surgeon: Lysle Rubens, MD  Assistant: Heath Lark, MD  Anesthesia: General endotracheal  Findings: Necrotic right colon, performed right hemicolectomy and left in discontinuity. Extensive scarring to peritoneum with adhesions to omentum, liver stomach and colon.  Complications: Patient required pressors and 2u pRBC for blood loss during the case  Specimen: right colon  EBL: 700cc  Drain: Abthera wound vac  Counts: Sponge, needle and instrument counts were reported correct x2 at conclusion of the operation.  Indications: Peter Oneill is a 82 y.o. male presented to the ED with acute abdominal pain and shortness of breath, found to have acute mesenteric ischemia on CTA Abdomen Pelvis. Patient revascularized with vascular surgery and proceeded with ex lap after due to CT findings concerning for ischemic colon.  Narrative: The patient was brought into the operating room, placed supine on the operating table. SCDs not placed because patient heparinized for his revascularization. General anesthesia was administered. The patient was prepped and draped in the standard sterile fashion from the chin to the knees. Preoperative antibiotics were administered on arrival to OR. A timeout was performed indicating the correct patient, planned procedure and likelihood of needing blood products.  A midline laparotomy was made and the fascia exposed and incised. The peritoneal cavity was entered.  The small bowel was eviscerated. There was obvious necrotic cecum and dusky TI.  The right colon was mobilized taking in a lateral to medial fashion, taking down the white line of toldt. The terminal ileum was divided ~15cm proximal to the cecum with a blue  load of an endo GIA stapler. Using ligasure, the mesentery was divided along the TI and right colon. The transverse colon was mobilized proximally and this dissection was carried through the hepatic flexure which was mobilized to connect to the right colon mobilization. The transverse colon was then divided with another load of the endo GIA stapler and again the ligasure was used to divide the associated mesentery. There was extensive scarring of the peritoneum and heavy adhesions from the colon to the omentum, liver and stomach.  The specimen was then remove after completion of dissection. The remaining mesentery was inspected and hemostasis achieved. The SMA was identified and palpated and had robust flow. The abdominal cavity was copiously irrigated. Given the pressors the patient was requiring and the extent of his ischemia prior to revascularization, an anastomosis was deferred and patient left in discontinuity. An abthera wound vac was placed to .  The patient was left intubated and transferred to the ICU in critically ill condition.  Disposition: ICU, critically ill.  Post-op: - Heparin gtt per vascular surgery - Recommend checking TEG if high wound vac output persists, initial output largely blood tinged irrigation from OR.  - Appreciate resuscitation and management by medicine/CCM service - Keep abthera at 125 mmHg - Continue zosyn - We will tentatively plan to take back to OR in next 24-48h

## 2022-12-26 NOTE — Progress Notes (Signed)
PHARMACY - ANTICOAGULATION CONSULT NOTE  Pharmacy Consult for Heparin  Indication: Mesenteric ischemia s/p angioplasty/stenting of SMA  Allergies  Allergen Reactions   Atorvastatin Other (See Comments)    Muscle aches    Patient Measurements: Height: 5\' 9"  (175.3 cm) Weight:  (unable to obtain weight d/t malfunction in bed) IBW/kg (Calculated) : 70.7  Vital Signs: Temp: 98.4 F (36.9 C) (11/01 2015) Temp Source: Esophageal (11/01 2000) Pulse Rate: 51 (11/01 2015)  Labs: Recent Labs    12/25/22 1603 12/25/22 1815 12/25/22 2311 12/26/22 0300 12/26/22 0507 12/26/22 0524 12/26/22 0941 12/26/22 1050 12/26/22 1529 12/26/22 1606 12/26/22 1658 12/26/22 1705 12/26/22 2001 12/26/22 2007  HGB 11.2*  --    < >  --  11.4*   < > 11.0*  --   --    < > 8.8* 8.2* 9.6* 8.5*  HCT 35.3*  --    < >  --  34.3*   < > 30.9*  --   --    < > 24.4* 24.0* 26.7* 25.0*  PLT 233  --    < >  --  121*  --  106*  --   --   --  102*  --  117*  --   APTT  --   --   --  63*  --   --   --   --   --   --   --   --   --   --   LABPROT  --   --   --  19.5*  --   --   --   --   --   --   --   --   --   --   INR  --   --   --  1.6*  --   --   --   --   --   --   --   --   --   --   HEPARINUNFRC  --   --   --   --   --   --   --   --   --   --   --   --  0.17*  --   CREATININE 3.71*  --    < >  --  3.71*  --   --  4.18* 4.18*  --   --   --   --   --   TROPONINIHS 80* 74*  --   --   --   --   --   --   --   --   --   --   --   --    < > = values in this interval not displayed.    Estimated Creatinine Clearance: 13.9 mL/min (A) (by C-G formula based on SCr of 4.18 mg/dL (H)).   Medical History: Past Medical History:  Diagnosis Date   Asbestosis (HCC)    Contraindication to percutaneous coronary intervention (PCI)    Coronary artery disease    Dyslipidemia    Dyspnea    ESRD on hemodialysis (HCC)    History of anemia    Hypercholesterolemia    Hypertension    Ileus (HCC)    Pleural effusion on  left    Pneumonia    Sleep apnea     Assessment: 82 y/o M with mesenteric ischemia s/p OR with vascular and general surgery. S/P angioplasty and stenting of the SMA and right hemi-colectomy.  Heparin level  0.17 is subtherapeutic on 800 units/hr.  No issues with infusion or bleeding per RN. Wound vac has minimal serosanguinous output.    Goal of Therapy:  Heparin level 0.2-0.5 units/ml Monitor platelets by anticoagulation protocol: Yes   Plan:   Increase heparin 900 units/hr Monitor daily heparin level, CBC, signs/symptoms of bleeding    Alphia Moh, PharmD, BCPS, BCCP Clinical Pharmacist  Please check AMION for all [facility] Pharmacy phone numbers After 10:00 PM, call Main Pharmacy [phone]

## 2022-12-26 NOTE — Procedures (Signed)
I have reviewed the pt's renal condition and adjusted the CRRT session to match the needs of the patient.  Vinson Moselle MD  [facility] 12/26/2022, 3:07 PM

## 2022-12-26 DEATH — deceased

## 2022-12-27 ENCOUNTER — Inpatient Hospital Stay (HOSPITAL_COMMUNITY): Payer: Medicare HMO | Admitting: Anesthesiology

## 2022-12-27 ENCOUNTER — Other Ambulatory Visit: Payer: Self-pay

## 2022-12-27 ENCOUNTER — Inpatient Hospital Stay (HOSPITAL_COMMUNITY): Payer: Medicare HMO

## 2022-12-27 ENCOUNTER — Encounter (HOSPITAL_COMMUNITY): Admission: EM | Disposition: E | Payer: Self-pay | Source: Home / Self Care | Attending: Pulmonary Disease

## 2022-12-27 DIAGNOSIS — E440 Moderate protein-calorie malnutrition: Secondary | ICD-10-CM | POA: Diagnosis not present

## 2022-12-27 DIAGNOSIS — I2510 Atherosclerotic heart disease of native coronary artery without angina pectoris: Secondary | ICD-10-CM

## 2022-12-27 DIAGNOSIS — K55059 Acute (reversible) ischemia of intestine, part and extent unspecified: Secondary | ICD-10-CM

## 2022-12-27 DIAGNOSIS — Z9889 Other specified postprocedural states: Secondary | ICD-10-CM | POA: Diagnosis not present

## 2022-12-27 DIAGNOSIS — K551 Chronic vascular disorders of intestine: Secondary | ICD-10-CM | POA: Diagnosis not present

## 2022-12-27 HISTORY — PX: LAPAROTOMY: SHX154

## 2022-12-27 HISTORY — PX: ILEOSTOMY: SHX1783

## 2022-12-27 LAB — RENAL FUNCTION PANEL
Albumin: 2 g/dL — ABNORMAL LOW (ref 3.5–5.0)
Albumin: 1.8 g/dL — ABNORMAL LOW (ref 3.5–5.0)
Anion gap: 8 (ref 5–15)
Anion gap: 10 (ref 5–15)
BUN: 27 mg/dL — ABNORMAL HIGH (ref 8–23)
BUN: 29 mg/dL — ABNORMAL HIGH (ref 8–23)
CO2: 22 mmol/L (ref 22–32)
CO2: 22 mmol/L (ref 22–32)
Calcium: 8.1 mg/dL — ABNORMAL LOW (ref 8.9–10.3)
Calcium: 8.1 mg/dL — ABNORMAL LOW (ref 8.9–10.3)
Chloride: 105 mmol/L (ref 98–111)
Chloride: 105 mmol/L (ref 98–111)
Creatinine, Ser: 2.58 mg/dL — ABNORMAL HIGH (ref 0.61–1.24)
Creatinine, Ser: 2.24 mg/dL — ABNORMAL HIGH (ref 0.61–1.24)
GFR, Estimated: 24 mL/min — ABNORMAL LOW (ref >60)
GFR, Estimated: 29 mL/min — ABNORMAL LOW (ref >60)
Glucose, Bld: 134 mg/dL — ABNORMAL HIGH (ref 70–99)
Glucose, Bld: 148 mg/dL — ABNORMAL HIGH (ref 70–99)
Phosphorus: 4.3 mg/dL (ref 2.5–4.6)
Phosphorus: 5 mg/dL — ABNORMAL HIGH (ref 2.5–4.6)
Potassium: 5 mmol/L (ref 3.5–5.1)
Potassium: 5.4 mmol/L — ABNORMAL HIGH (ref 3.5–5.1)
Sodium: 135 mmol/L (ref 135–145)
Sodium: 137 mmol/L (ref 135–145)

## 2022-12-27 LAB — MAGNESIUM: Magnesium: 2 mg/dL (ref 1.7–2.4)

## 2022-12-27 LAB — HEPATIC FUNCTION PANEL
ALT: 35 U/L (ref 0–44)
AST: 51 U/L — ABNORMAL HIGH (ref 15–41)
Albumin: 2 g/dL — ABNORMAL LOW (ref 3.5–5.0)
Alkaline Phosphatase: 66 U/L (ref 38–126)
Bilirubin, Direct: 0.6 mg/dL — ABNORMAL HIGH (ref 0.0–0.2)
Indirect Bilirubin: 0.6 mg/dL (ref 0.3–0.9)
Total Bilirubin: 1.2 mg/dL (ref 0.3–1.2)
Total Protein: 5.1 g/dL — ABNORMAL LOW (ref 6.5–8.1)

## 2022-12-27 LAB — CBC
HCT: 25.7 % — ABNORMAL LOW (ref 39.0–52.0)
Hemoglobin: 9.1 g/dL — ABNORMAL LOW (ref 13.0–17.0)
MCH: 31.3 pg (ref 26.0–34.0)
MCHC: 35.4 g/dL (ref 30.0–36.0)
MCV: 88.3 fL (ref 80.0–100.0)
Platelets: 139 10*3/uL — ABNORMAL LOW (ref 150–400)
RBC: 2.91 MIL/uL — ABNORMAL LOW (ref 4.22–5.81)
RDW: 16.4 % — ABNORMAL HIGH (ref 11.5–15.5)
WBC: 13 10*3/uL — ABNORMAL HIGH (ref 4.0–10.5)
nRBC: 0.2 % (ref 0.0–0.2)

## 2022-12-27 LAB — HEPARIN LEVEL (UNFRACTIONATED)
Heparin Unfractionated: 0.17 [IU]/mL — ABNORMAL LOW (ref 0.30–0.70)
Heparin Unfractionated: 0.13 [IU]/mL — ABNORMAL LOW (ref 0.30–0.70)

## 2022-12-27 LAB — GLUCOSE, CAPILLARY
Glucose-Capillary: 112 mg/dL — ABNORMAL HIGH (ref 70–99)
Glucose-Capillary: 116 mg/dL — ABNORMAL HIGH (ref 70–99)
Glucose-Capillary: 126 mg/dL — ABNORMAL HIGH (ref 70–99)
Glucose-Capillary: 132 mg/dL — ABNORMAL HIGH (ref 70–99)
Glucose-Capillary: 134 mg/dL — ABNORMAL HIGH (ref 70–99)
Glucose-Capillary: 136 mg/dL — ABNORMAL HIGH (ref 70–99)
Glucose-Capillary: 85 mg/dL (ref 70–99)

## 2022-12-27 LAB — POCT I-STAT 7, (LYTES, BLD GAS, ICA,H+H)
Acid-base deficit: 2 mmol/L (ref 0.0–2.0)
Bicarbonate: 25.1 mmol/L (ref 20.0–28.0)
Calcium, Ion: 1.17 mmol/L (ref 1.15–1.40)
HCT: 21 % — ABNORMAL LOW (ref 39.0–52.0)
Hemoglobin: 7.1 g/dL — ABNORMAL LOW (ref 13.0–17.0)
O2 Saturation: 99 %
Patient temperature: 37.5
Potassium: 5.4 mmol/L — ABNORMAL HIGH (ref 3.5–5.1)
Sodium: 135 mmol/L (ref 135–145)
TCO2: 27 mmol/L (ref 22–32)
pCO2 arterial: 55.9 mm[Hg] — ABNORMAL HIGH (ref 32–48)
pH, Arterial: 7.262 — ABNORMAL LOW (ref 7.35–7.45)
pO2, Arterial: 160 mm[Hg] — ABNORMAL HIGH (ref 83–108)

## 2022-12-27 LAB — PREPARE FRESH FROZEN PLASMA: Unit division: 0

## 2022-12-27 LAB — TRIGLYCERIDES: Triglycerides: 29 mg/dL (ref <150)

## 2022-12-27 LAB — BPAM FFP
Blood Product Expiration Date: 202411062359
ISSUE DATE / TIME: 202411010543
Unit Type and Rh: 5100

## 2022-12-27 SURGERY — LAPAROTOMY, EXPLORATORY
Anesthesia: General | Site: Abdomen

## 2022-12-27 MED ORDER — WHITE PETROLATUM EX OINT
TOPICAL_OINTMENT | CUTANEOUS | Status: DC | PRN
  Administered 2022-12-28: 0.2 via TOPICAL
  Filled 2022-12-27 (×2): qty 28.35

## 2022-12-27 MED ORDER — ROCURONIUM BROMIDE 10 MG/ML (PF) SYRINGE
PREFILLED_SYRINGE | INTRAVENOUS | Status: DC | PRN
  Administered 2022-12-27 (×2): 50 mg via INTRAVENOUS

## 2022-12-27 MED ORDER — DEXAMETHASONE SODIUM PHOSPHATE 10 MG/ML IJ SOLN
INTRAMUSCULAR | Status: DC | PRN
  Administered 2022-12-27: 8 mg via INTRAVENOUS

## 2022-12-27 MED ORDER — ONDANSETRON HCL 4 MG/2ML IJ SOLN
INTRAMUSCULAR | Status: DC | PRN
  Administered 2022-12-27: 4 mg via INTRAVENOUS

## 2022-12-27 MED ORDER — HEPARIN (PORCINE) 25000 UT/250ML-% IV SOLN: 900.0000 [IU]/h | INTRAVENOUS | Status: DC

## 2022-12-27 MED ORDER — 0.9 % SODIUM CHLORIDE (POUR BTL) OPTIME
TOPICAL | Status: DC | PRN
  Administered 2022-12-27: 2000 mL

## 2022-12-27 MED ORDER — LACTATED RINGERS IV SOLN: INTRAVENOUS | Status: DC | PRN

## 2022-12-27 MED ORDER — PRISMASOL BGK 0/2.5 32-2.5 MEQ/L EC SOLN
Status: DC
  Filled 2022-12-27 (×20): qty 5000

## 2022-12-27 MED ORDER — FENTANYL CITRATE (PF) 250 MCG/5ML IJ SOLN
INTRAMUSCULAR | Status: AC
  Filled 2022-12-27: qty 5

## 2022-12-27 MED ORDER — PROPOFOL 10 MG/ML IV BOLUS
INTRAVENOUS | Status: AC
  Filled 2022-12-27: qty 20

## 2022-12-27 MED ORDER — FENTANYL CITRATE (PF) 250 MCG/5ML IJ SOLN
INTRAMUSCULAR | Status: DC | PRN
  Administered 2022-12-27 (×2): 25 ug via INTRAVENOUS
  Administered 2022-12-27: 50 ug via INTRAVENOUS
  Administered 2022-12-27: 25 ug via INTRAVENOUS

## 2022-12-27 MED ORDER — VASOPRESSIN 20 UNIT/ML IV SOLN
INTRAVENOUS | Status: AC
  Filled 2022-12-27: qty 1

## 2022-12-27 MED ORDER — HEPARIN (PORCINE) 25000 UT/250ML-% IV SOLN
1100.0000 [IU]/h | INTRAVENOUS | Status: DC
  Administered 2022-12-27: 900 [IU]/h via INTRAVENOUS
  Administered 2022-12-28: 1000 [IU]/h via INTRAVENOUS
  Administered 2022-12-29 – 2023-01-01 (×4): 1100 [IU]/h via INTRAVENOUS
  Filled 2022-12-27 (×5): qty 250

## 2022-12-27 SURGICAL SUPPLY — 49 items
APL PRP STRL LF DISP 70% ISPRP (MISCELLANEOUS)
BAG COUNTER SPONGE SURGICOUNT (BAG) ×2 IMPLANT
BAG SPNG CNTER NS LX DISP (BAG) ×2
BIOPATCH RED 1 DISK 7.0 (GAUZE/BANDAGES/DRESSINGS) IMPLANT
BLADE CLIPPER SURG (BLADE) IMPLANT
CANISTER SUCT 3000ML PPV (MISCELLANEOUS) ×2 IMPLANT
CHLORAPREP W/TINT 26 (MISCELLANEOUS) ×2 IMPLANT
COVER SURGICAL LIGHT HANDLE (MISCELLANEOUS) ×2 IMPLANT
DRAIN CHANNEL 19F RND (DRAIN) IMPLANT
DRAPE LAPAROSCOPIC ABDOMINAL (DRAPES) ×2 IMPLANT
DRAPE WARM FLUID 44X44 (DRAPES) ×2 IMPLANT
DRSG OPSITE POSTOP 4X10 (GAUZE/BANDAGES/DRESSINGS) IMPLANT
DRSG OPSITE POSTOP 4X8 (GAUZE/BANDAGES/DRESSINGS) IMPLANT
ELECT BLADE 6.5 EXT (BLADE) IMPLANT
ELECT CAUTERY BLADE 6.4 (BLADE) ×2 IMPLANT
ELECT REM PT RETURN 9FT ADLT (ELECTROSURGICAL) ×2
ELECTRODE REM PT RTRN 9FT ADLT (ELECTROSURGICAL) ×2 IMPLANT
EVACUATOR SILICONE 100CC (DRAIN) IMPLANT
GAUZE PAD ABD 8X10 STRL (GAUZE/BANDAGES/DRESSINGS) IMPLANT
GAUZE SPONGE 4X4 12PLY STRL (GAUZE/BANDAGES/DRESSINGS) IMPLANT
GLOVE BIO SURGEON STRL SZ 6 (GLOVE) ×2 IMPLANT
GLOVE INDICATOR 6.5 STRL GRN (GLOVE) ×2 IMPLANT
GOWN STRL REUS W/ TWL LRG LVL3 (GOWN DISPOSABLE) ×4 IMPLANT
GOWN STRL REUS W/TWL LRG LVL3 (GOWN DISPOSABLE) ×4
HANDLE SUCTION POOLE (INSTRUMENTS) ×2 IMPLANT
KIT BASIN OR (CUSTOM PROCEDURE TRAY) ×2 IMPLANT
KIT OSTOMY DRAINABLE 2.75 STR (WOUND CARE) IMPLANT
KIT TURNOVER KIT B (KITS) ×2 IMPLANT
LIGASURE IMPACT 36 18CM CVD LR (INSTRUMENTS) IMPLANT
NS IRRIG 1000ML POUR BTL (IV SOLUTION) ×4 IMPLANT
PACK GENERAL/GYN (CUSTOM PROCEDURE TRAY) ×2 IMPLANT
PAD ARMBOARD 7.5X6 YLW CONV (MISCELLANEOUS) ×2 IMPLANT
PENCIL SMOKE EVACUATOR (MISCELLANEOUS) ×2 IMPLANT
SPECIMEN JAR LARGE (MISCELLANEOUS) IMPLANT
SPONGE DRAIN TRACH 4X4 STRL 2S (GAUZE/BANDAGES/DRESSINGS) IMPLANT
SPONGE T-LAP 18X18 ~~LOC~~+RFID (SPONGE) IMPLANT
STAPLER PROXIMATE 75MM BLUE (STAPLE) IMPLANT
STAPLER VISISTAT 35W (STAPLE) ×2 IMPLANT
SUCTION POOLE HANDLE (INSTRUMENTS) ×2
SUT ETHILON 2 0 FS 18 (SUTURE) IMPLANT
SUT PDS AB 1 TP1 96 (SUTURE) ×4 IMPLANT
SUT SILK 2 0 SH CR/8 (SUTURE) ×2 IMPLANT
SUT SILK 2 0 TIES 10X30 (SUTURE) ×2 IMPLANT
SUT SILK 3 0 SH CR/8 (SUTURE) ×2 IMPLANT
SUT SILK 3 0 TIES 10X30 (SUTURE) ×2 IMPLANT
SUT VIC AB 3-0 SH 18 (SUTURE) IMPLANT
TAPE CLOTH SURG 4X10 WHT LF (GAUZE/BANDAGES/DRESSINGS) IMPLANT
TOWEL GREEN STERILE (TOWEL DISPOSABLE) ×2 IMPLANT
TRAY FOLEY MTR SLVR 16FR STAT (SET/KITS/TRAYS/PACK) IMPLANT

## 2022-12-27 NOTE — Progress Notes (Signed)
eLink Physician-Brief Progress Note Patient Name: Peter Oneill DOB: 20-Jun-1940 MRN: [id number]   Date of Service  12/27/2022  HPI/Events of Note  Resp acidemia  eICU Interventions  Vent management     ABG 7.26/56/160   We will decrease FIO2 to 30% We will increase rate to 14 Keep TV at 490 PEEP at 5   Intervention Category Major Interventions: Acid-Base disturbance - evaluation and management  Massie Maroon 12/27/2022, 8:13 PM

## 2022-12-27 NOTE — Plan of Care (Signed)
  Problem: Clinical Measurements: Goal: Will remain free from infection Outcome: Progressing Goal: Diagnostic test results will improve Outcome: Progressing Goal: Respiratory complications will improve Outcome: Progressing   Problem: Pain Management: Goal: General experience of comfort will improve Outcome: Progressing   Problem: Safety: Goal: Ability to remain free from injury will improve Outcome: Progressing   Problem: Skin Integrity: Goal: Risk for impaired skin integrity will decrease Outcome: Progressing   Problem: Fluid Volume: Goal: Ability to maintain a balanced intake and output will improve Outcome: Progressing

## 2022-12-27 NOTE — Transfer of Care (Signed)
Immediate Anesthesia Transfer of Care Note  Patient: Peter Oneill  Procedure(s) Performed: EXPLORATORY LAPAROTOMY  AND ILEOSTOMY WITH PLACEMENT OF DRAIN. (Abdomen) ILEOSTOMY  Patient Location: SICU  Anesthesia Type:General  Level of Consciousness: Patient remains intubated per anesthesia plan  Airway & Oxygen Therapy: Patient remains intubated per anesthesia plan and Patient placed on Ventilator (see vital sign flow sheet for setting)  Post-op Assessment: Report given to RN and Post -op Vital signs reviewed and stable  Post vital signs: Reviewed and stable  Last Vitals:  Vitals Value Taken Time  BP    Temp    Pulse    Resp    SpO2      Last Pain:  Vitals:   12/27/22 0400  TempSrc: Esophageal  PainSc:          Complications: No notable events documented.

## 2022-12-27 NOTE — Progress Notes (Signed)
Returned to unit from OR. Bedside report received from CRNA.

## 2022-12-27 NOTE — Progress Notes (Signed)
PHARMACY - ANTICOAGULATION CONSULT NOTE  Pharmacy Consult for Heparin  Indication: Mesenteric ischemia s/p angioplasty/stenting of SMA  Allergies  Allergen Reactions   Atorvastatin Other (See Comments)    Muscle aches    Patient Measurements: Height: 5\' 9"  (175.3 cm) Weight: 89.6 kg (197 lb 8.5 oz) IBW/kg (Calculated) : 70.7  Vital Signs: Temp: 99.1 F (37.3 C) (11/02 2312) Temp Source: Core (11/02 1600) BP: 99/88 (11/02 2000) Pulse Rate: 67 (11/02 2312)  Labs: Recent Labs    12/25/22 1603 12/25/22 1815 12/25/22 2311 12/26/22 0300 12/26/22 0507 12/26/22 1529 12/26/22 1606 12/26/22 1658 12/26/22 1705 12/26/22 2001 12/26/22 2007 12/27/22 0327 12/27/22 1617 12/27/22 1725 12/27/22 2100  HGB 11.2*  --    < >  --    < >  --    < > 8.8*   < > 9.6* 8.5* 9.1*  --  7.1*  --   HCT 35.3*  --    < >  --    < >  --    < > 24.4*   < > 26.7* 25.0* 25.7*  --  21.0*  --   PLT 233  --    < >  --    < >  --   --  102*  --  117*  --  139*  --   --   --   APTT  --   --   --  63*  --   --   --   --   --   --   --   --   --   --   --   LABPROT  --   --   --  19.5*  --   --   --   --   --   --   --   --   --   --   --   INR  --   --   --  1.6*  --   --   --   --   --   --   --   --   --   --   --   HEPARINUNFRC  --   --   --   --   --   --   --   --   --  0.17*  --  0.17*  --   --  0.13*  CREATININE 3.71*  --    < >  --    < > 4.18*  --   --   --   --   --  2.58* 2.24*  --   --   TROPONINIHS 80* 74*  --   --   --   --   --   --   --   --   --   --   --   --   --    < > = values in this interval not displayed.    Estimated Creatinine Clearance: 28.6 mL/min (A) (by C-G formula based on SCr of 2.24 mg/dL (H)).   Medical History: Past Medical History:  Diagnosis Date   Asbestosis (HCC)    Contraindication to percutaneous coronary intervention (PCI)    Coronary artery disease    Dyslipidemia    Dyspnea    ESRD on hemodialysis (HCC)    History of anemia    Hypercholesterolemia     Hypertension    Ileus (HCC)    Pleural effusion on left    Pneumonia  Sleep apnea     Assessment: 82 y/o M with mesenteric ischemia s/p OR with vascular and general surgery. S/P angioplasty and stenting of the SMA and right hemi-colectomy.  Heparin level 0.17 is subtherapeutic, but then heparin was turned off for OR.  No issues with infusion or bleeding per RN. Wound vac has minimal serosanguinous output.    11/2 PM update:  Heparin level sub-therapeutic after re-start Hgb down some after return trip to OR today  Goal of Therapy:  Heparin level 0.2-0.5 units/ml Monitor platelets by anticoagulation protocol: Yes   Plan:   Inc heparin to 1000 units/hr Re-check heparin level in 8 hours Watch Hgb closely  Abran Duke, PharmD, BCPS Clinical Pharmacist Phone: [phone]

## 2022-12-27 NOTE — Anesthesia Postprocedure Evaluation (Signed)
Anesthesia Post Note  Patient: Peter Oneill  Procedure(s) Performed: EXPLORATORY LAPAROTOMY  AND ILEOSTOMY WITH PLACEMENT OF DRAIN. (Abdomen) ILEOSTOMY     Patient location during evaluation: PACU Anesthesia Type: General Level of consciousness: awake and alert Pain management: pain level controlled Vital Signs Assessment: post-procedure vital signs reviewed and stable Respiratory status: spontaneous breathing, nonlabored ventilation, respiratory function stable and patient connected to nasal cannula oxygen Cardiovascular status: blood pressure returned to baseline and stable Postop Assessment: no apparent nausea or vomiting Anesthetic complications: no   No notable events documented.  Last Vitals:  Vitals:   12/27/22 1230 12/27/22 1245  BP: (!) 108/39 (!) 103/46  Pulse: (!) 51 (!) 52  Resp: 18 19  Temp: (!) 36.4 C 36.5 C  SpO2: 100% 100%    Last Pain:  Vitals:   12/27/22 0400  TempSrc: Esophageal  PainSc:                  [place]

## 2022-12-27 NOTE — Progress Notes (Signed)
PHARMACY - ANTICOAGULATION CONSULT NOTE  Pharmacy Consult for Heparin  Indication: Mesenteric ischemia s/p angioplasty/stenting of SMA  Allergies  Allergen Reactions   Atorvastatin Other (See Comments)    Muscle aches    Patient Measurements: Height: 5\' 9"  (175.3 cm) Weight: 89.6 kg (197 lb 8.5 oz) IBW/kg (Calculated) : 70.7  Vital Signs: Temp: 97.7 F (36.5 C) (11/02 1245) Temp Source: Esophageal (11/02 0400) BP: 103/46 (11/02 1245) Pulse Rate: 52 (11/02 1245)  Labs: Recent Labs    12/25/22 1603 12/25/22 1815 12/25/22 2311 12/26/22 0300 12/26/22 0507 12/26/22 1050 12/26/22 1529 12/26/22 1606 12/26/22 1658 12/26/22 1705 12/26/22 2001 12/26/22 2007 12/27/22 0327  HGB 11.2*  --    < >  --    < >  --   --    < > 8.8*   < > 9.6* 8.5* 9.1*  HCT 35.3*  --    < >  --    < >  --   --    < > 24.4*   < > 26.7* 25.0* 25.7*  PLT 233  --    < >  --    < >  --   --   --  102*  --  117*  --  139*  APTT  --   --   --  63*  --   --   --   --   --   --   --   --   --   LABPROT  --   --   --  19.5*  --   --   --   --   --   --   --   --   --   INR  --   --   --  1.6*  --   --   --   --   --   --   --   --   --   HEPARINUNFRC  --   --   --   --   --   --   --   --   --   --  0.17*  --  0.17*  CREATININE 3.71*  --    < >  --    < > 4.18* 4.18*  --   --   --   --   --  2.58*  TROPONINIHS 80* 74*  --   --   --   --   --   --   --   --   --   --   --    < > = values in this interval not displayed.    Estimated Creatinine Clearance: 24.9 mL/min (A) (by C-G formula based on SCr of 2.58 mg/dL (H)).   Medical History: Past Medical History:  Diagnosis Date   Asbestosis (HCC)    Contraindication to percutaneous coronary intervention (PCI)    Coronary artery disease    Dyslipidemia    Dyspnea    ESRD on hemodialysis (HCC)    History of anemia    Hypercholesterolemia    Hypertension    Ileus (HCC)    Pleural effusion on left    Pneumonia    Sleep apnea     Assessment: 82 y/o  M with mesenteric ischemia s/p OR with vascular and general surgery. S/P angioplasty and stenting of the SMA and right hemi-colectomy.  Heparin level 0.17 is subtherapeutic, but then heparin was turned off for OR.  No issues with infusion  or bleeding per RN. Wound vac has minimal serosanguinous output.    Goal of Therapy:  Heparin level 0.2-0.5 units/ml Monitor platelets by anticoagulation protocol: Yes   Plan:   Resume heparin at 900 units/hr at 1 pm today Check heparin level 8 hrs after gtt restarts. Monitor daily heparin level, CBC, signs/symptoms of bleeding   Reece Leader, Colon Flattery, [facility] Clinical Pharmacist  12/27/2022 1:35 PM   [facility] pharmacy phone numbers are listed on [email or web address]

## 2022-12-27 NOTE — Progress Notes (Signed)
[facility] Progress Note  Subjective: seen in ICU  Vitals:   12/27/22 2048 12/27/22 2049 12/27/22 2050 12/27/22 2051  BP:      Pulse: 62 62 (!) 59 (!) 59  Resp: (!) 21 (!) 22 (!) 21 (!) 22  Temp: 99 F (37.2 C) 99 F (37.2 C) 99 F (37.2 C) 99 F (37.2 C)  TempSrc:      SpO2: 100% 100% 100% 100%  Weight:      Height:        Exam: Gen on vent, sedated Sclera anicteric, throat w/ ETT No jvd or bruits Chest clear anterior/ lateral RRR no RG Abd +dressing Ext no UE edema Neuro is on vent, sedated    LUA AVF+bruit     Renal-related home meds: - plavix - renavite - velphoro 500 ac tid - norvasc 5 every day - others: statin, zetia, allopurinol, rapaflo      OP HD: TTS NW 4h   350/1.5    79.6kg   2/2 bath   AVF   Heparin none - last OP HD 10/31, post wt 79.5kg - getting to dry wt, good compliance w/ HD - rocaltrol 0.5 mcg  - mircera 30 mcg IV q 4 wks, last 10/19, due 11/16     Assessment/ Plan: Acute mesenteric ischemia - sp R colon resection, revascularization of the SMA on 11/01. Bowel left in discontinuity. Went for washout today again. On IV heparin for vascular patency.  Shock - likely combination of hemorrhagic shock and vasoplegia. Weaned down to levo gtt only at 10 micrograms/min.  ABL anemia - sp 4u prbcs. Hb 7s today. Per CCM.  ESKD - on HD TTS. Has not missed OP HD. CRRT started 11/01. Cont CRRT Volume - 10kg over today. Change to net neg UF 50-75 cc/hr.  Anemia of eskd - last esa given on 10/19 at OP unit. Follow. Tranfusing prn.  MBD ckd - CCa and phos in range. Can continue po vdra thru the tube. Hold vephoro/ binder until eating again. Hyperkalemia - will change dialysate to 2K this evening. If persists will change pre/ post tomorrow.       Vinson Moselle MD  [facility] 12/27/2022, 9:31 PM  Recent Labs  Lab 12/27/22 0327 12/27/22 1617 12/27/22 1725  HGB 9.1*  --  7.1*  ALBUMIN 2.0*  2.0* 1.8*  --   CALCIUM 8.1* 8.1*  --   PHOS 4.3  5.0*  --   CREATININE 2.58* 2.24*  --   K 5.0 5.4* 5.4*   No results for input(s): "IRON", "TIBC", "FERRITIN" in the last 168 hours. Inpatient medications:  arformoterol  15 mcg Nebulization BID   Chlorhexidine Gluconate Cloth  6 each Topical Daily   doxercalciferol  4 mcg Intravenous Q M,W,F   insulin aspart  0-15 Units Subcutaneous Q4H   latanoprost  1 drop Both Eyes QHS   mouth rinse  15 mL Mouth Rinse Q2H   revefenacin  175 mcg Nebulization Daily   sodium chloride flush  10-40 mL Intracatheter Q12H     prismasol BGK 4/2.5 400 mL/hr at 12/27/22 1116    prismasol BGK 4/2.5 400 mL/hr at 12/27/22 1116   sodium chloride     dexmedetomidine (PRECEDEX) IV infusion 1 mcg/kg/hr (12/27/22 2008)   famotidine (PEPCID) IV Stopped (12/27/22 0237)   fentaNYL infusion INTRAVENOUS 125 mcg/hr (12/27/22 2000)   heparin 900 Units/hr (12/27/22 2000)   norepinephrine (LEVOPHED) Adult infusion 14 mcg/min (12/27/22 2000)   piperacillin-tazobactam (ZOSYN)  IV  12.5 mL/hr at 12/27/22 2000   prismasol BGK 4/2.5 1,500 mL/hr at 12/27/22 1823   vasopressin Stopped (12/27/22 1754)   fentaNYL, heparin, midazolam, mouth rinse, sodium chloride flush, white petrolatum

## 2022-12-27 NOTE — Progress Notes (Signed)
Bedside updated given to CRNA. Off unit to OR.

## 2022-12-27 NOTE — Op Note (Signed)
Operative Note  Demetric Dunnaway  [id number]  [id number]  12/27/2022   Surgeon: Phylliss Blakes MD FACS   Procedure performed: Reopening of abdomen, abdominal washout, small bowel resection, creation of end ileostomy   Preop diagnosis: Open abdomen, abdominal discontinuity status post right colectomy for ischemic bowel and SMA revascularization Post-op diagnosis/intraop findings: Bowel appeared viable.  The distal 5 cm or so of the divided terminal ileum was slightly dusky and was resected.  Diffuse raw surface oozing but no active bleeding.   Specimens: no Retained items: 60fr round blake drain  EBL: minimal  Complications: none   Description of procedure: After confirming informed consent the patient was taken to the operating room and placed supine on operating room table where general endotracheal anesthesia was initiated, preoperative antibiotics were administered, SCDs applied, and a formal timeout was performed.  The outer drape of his wound VAC was removed and the abdomen was prepped and draped in usual sterile fashion with Betadine.  The inner drape was then removed.  Moderate amount of clot was gently debrided and sanguinous serous ascites aspirated with the pool sucker.  The transverse colon and staple line were readily identified in the upper abdomen and this did appear viable with an intact staple line.  Dense upper abdominal adhesions were present such that I could not feel over the dome of the liver or palpate the stomach without taking down further adhesions and so this was left alone.  The descending and sigmoid colon appeared viable.  Ligament of Treitz was identified and the small bowel was run in its entirety without any evidence of ongoing ischemic changes aside from the very distal 5 cm of the stapled terminal ileum which appeared very slightly dusky.  This was resected with a 75 mm blue load Endo GIA stapler and the mesentery was divided and ligated with 2-0 silks ties.  The  abdomen was irrigated with 2 L of warm sterile saline and the effluent was clear.  There was diffuse raw surface oozing but no active bleeding.  An ileostomy site was created in the right upper quadrant and the stapled end of the terminal ileum brought up through this ensuring that the mesentery did not twist.  This was without tension.  A 19 French round Blake drain was inserted through an incision in the right lower quadrant and directed up along the right hemiabdomen with the tip near but not directly apposed to the colon staple line, this was secured to the skin with a 2-0 nylon and placed to bulb suction.  The fascia was closed with running looped #1 PDS starting at either end and tying centrally and a damp to dry dressing was applied.  The terminal ileum staple line was then resected with the cautery and a brooked end ileostomy fashioned with interrupted 3-0 Vicryl's.  On completion I was able to digitized the stoma with my 5th digit beyond the fascia.  The mucosa appeared erythematous but perfused.  An ostomy appliance was placed.  The patient was then returned to the ICU in critical condition.    All counts were correct at the completion of the case.

## 2022-12-27 NOTE — Progress Notes (Addendum)
NAME:  Peter Oneill, MRN:  [id number], DOB:  May 14, 1940, LOS: 2 ADMISSION DATE:  12/25/2022, CONSULTATION DATE:  12/27/22 REFERRING MD:  EDP, CHIEF COMPLAINT:  Abdominal Pain   History of Present Illness:  Peter Oneill is a 82 y.o. M with PMH if ESRD, Asbestosis, CAD s/p CABG, HTN, HL, OSA and emphysema followed by Dr. [name] who presented to the ED with acutely worsening abdominal pain that began the day of admission.  He underwent dialysis in the morning and then felt short of breath followed by abdominal discomfort.  His initial oxygen saturation was 70% on EMS arrival and his work-up in the ED included a CT Abd/pelvis concerning for acute mesenteric ischemia secondary to occlusion of the celiac artery and significant stenosis of the superior mesenteric artery and small inferior mesenteric artery.  His labs showed a lactic acid of 1.4, WBC 6.3, creatinine 3.7, BNP >1300, normal Hgb.  He was taken emergently to the OR for urgent revascularization and SMA stenting along with ex-lap.  PCCM asked to admit  Pertinent  Medical History   has a past medical history of Asbestosis (HCC), Contraindication to percutaneous coronary intervention (PCI), Coronary artery disease, Dyslipidemia, Dyspnea, ESRD on hemodialysis (HCC), History of anemia, Hypercholesterolemia, Hypertension, Ileus (HCC), Pleural effusion on left, Pneumonia, and Sleep apnea.   Significant Hospital Events: Including procedures, antibiotic start and stop dates in addition to other pertinent events   10/31 presented with mesenteric ischemia, occlusion of the celiac and significant stenosis of the SMA and small inferior mesenteric artery. Went to OR for urgent revascularization and SMA stenting along with ex-lap. Right hemicolectomy for necrotic cecum. Significant pressor requirement and bleeding (800 cc 1st hour) from abdominal wound vac post-op, given 4L PRBC's, 500cc albumin, 500cc NS and was on Levophed , Neo , Vaso 0.04  and Epi . Pressors were titrated down with resuscitation.  11/1. CVL placed for pressors. LE warm . Weaning pressors. Orthostatic w/ positional change. 2.2 liters positive. Changing prop to dex. Drain out decreased   Interim History / Subjective:  Went back to the OR today with resection of colon and small part of terminal ileum with ileostomy Remain on vasopressor support with Levophed and vasopressin On CRRT Rhythm is better controlled now bradycardic with A-fib  Objective   Blood pressure (!) 114/46, pulse (!) 51, temperature (!) 96.3 F (35.7 C), resp. rate (!) 25, height 5\' 9"  (1.753 m), weight 89.6 kg, SpO2 100%. CVP:  [0 mmHg-13 mmHg] 7 mmHg  Vent Mode: PRVC FiO2 (%):  [40 %] 40 % Set Rate:  [10 bmp-16 bmp] 10 bmp Vt Set:  [420 mL-560 mL] 420 mL PEEP:  [5 cmH20] 5 cmH20 Plateau Pressure:  [17 cmH20-23 cmH20] 19 cmH20   Intake/Output Summary (Last 24 hours) at 12/27/2022 1137 Last data filed at 12/27/2022 1100 Gross per 24 hour  Intake 4419.77 ml  Output 942.6 ml  Net 3477.17 ml   Filed Weights   12/25/22 1807 12/27/22 0440  Weight: 81.2 kg 89.6 kg   Physical exam: General: Crtitically ill-appearing elderly male, orally intubated HEENT: [place]/AT, eyes anicteric.  ETT and OGT in place Neuro: Sedated, not following commands.  Eyes are closed.  Pupils 3 mm bilateral reactive to light Chest: Coarse breath sounds, no wheezes or rhonchi Heart: Regular rate and rhythm, no murmurs or gallops Abdomen: Soft, central laparotomy incision looks clean and dry, absent bowel sounds present Skin: No rash  Labs and images reviewed  Resolved Hospital Problem list  Assessment & Plan:  Acute mesenteric ischemia with ischemic bowel status post ex lap x 2, resection of large bowel with ileostomy and angioplasty of SMA Shock, mixed septic/vasoplegic/hypovolemic Acute metabolic acidosis Acute respiratory failure with hypoxia Expected postop blood loss anemia and  thrombocytopenia End-stage renal disease on hemodialysis Paroxysmal A-fib with slow rate Moderate malnutrition  Appreciate general surgery follow-up Patient went back to the OR today for abdominal washout and resection of large bowel and part of terminal ileum due to ischemia Appreciate vascular surgery follow-up Continue IV heparin infusion Remain in shock with wide pulse pressure, on Levophed and vasopressin with SBP goal 120 Trend ABG and lactate Monitor H&H and transfuse to keep Hb 8 Currently on CRRT with net positive fluid balance of 50 cc/h Continue IV antibiotic with Zosyn Continue lung protective ventilation VAP prevention bundle in place PAD protocol with fentanyl with RASS goal -2 Patient remain in A-fib with slow rate, heart rate 40-50 Yesterday he had A-fib with aberrant rhythm, was on amiodarone Will discontinue amiodarone now Will start TPN by tomorrow  Best Practice (right click and "Reselect all SmartList Selections" daily)   Diet/type: NPO DVT prophylaxis: Systemic heparin GI prophylaxis: H2B Lines: Central line Foley:  Yes, and it is still needed Code Status:  full code Last date of multidisciplinary goals of care discussion: 11/1: Patient's family was updated at bedside, decision was to continue full scope of care   The patient is critically ill due to shock/acute mesenteric ischemia with ischemic bowel.  Critical care was necessary to treat or prevent imminent or life-threatening deterioration.  Critical care was time spent personally by me on the following activities: development of treatment plan with patient and/or surrogate as well as nursing, discussions with consultants, evaluation of patient's response to treatment, examination of patient, obtaining history from patient or surrogate, ordering and performing treatments and interventions, ordering and review of laboratory studies, ordering and review of radiographic studies, pulse oximetry, re-evaluation of  patient's condition and participation in multidisciplinary rounds.   During this encounter critical care time was devoted to patient care services described in this note for 39 minutes.    Cheri Fowler, MD [facility] See Amion for pager If no response to pager, please call [phone] until 7pm After 7pm, Please call E-link [phone]

## 2022-12-27 NOTE — Progress Notes (Signed)
    Subjective  - POD #2  Remains intubated.  Son at the bedside   Physical Exam:  Doppler pedal signals Intubated / sedated       Assessment/Plan:  POD #2  Plan for return trip to the OR this am with general surgery  Peter Oneill 12/27/2022 9:02 AM --  Vitals:   12/27/22 0800 12/27/22 0811  BP:    Pulse: (!) 54 (!) 56  Resp: (!) 24 (!) 24  Temp: (!) 97.5 F (36.4 C) (!) 97 F (36.1 C)  SpO2: 100% 100%    Intake/Output Summary (Last 24 hours) at 12/27/2022 0902 Last data filed at 12/27/2022 0800 Gross per 24 hour  Intake 3975.9 ml  Output 892.6 ml  Net 3083.3 ml     Laboratory CBC    Component Value Date/Time   WBC 13.0 (H) 12/27/2022 0327   HGB 9.1 (L) 12/27/2022 0327   HGB 11.4 (L) 07/12/2021 1057   HCT 25.7 (L) 12/27/2022 0327   HCT 33.5 (L) 07/12/2021 1057   PLT 139 (L) 12/27/2022 0327   PLT 212 07/12/2021 1057    BMET    Component Value Date/Time   NA 135 12/27/2022 0327   K 5.0 12/27/2022 0327   CL 105 12/27/2022 0327   CO2 22 12/27/2022 0327   GLUCOSE 134 (H) 12/27/2022 0327   BUN 27 (H) 12/27/2022 0327   CREATININE 2.58 (H) 12/27/2022 0327   CALCIUM 8.1 (L) 12/27/2022 0327   CALCIUM 8.2 (L) 05/15/2021 0750   GFRNONAA 24 (L) 12/27/2022 0327    COAG Lab Results  Component Value Date   INR 1.6 (H) 12/26/2022   No results found for: "PTT"  Antibiotics Anti-infectives (From admission, onward)    Start     Dose/Rate Route Frequency Ordered Stop   12/26/22 1800  [MAR Hold]  piperacillin-tazobactam (ZOSYN) IVPB 3.375 g        (MAR Hold since Sat 12/27/2022 at 0835.Hold Reason: Transfer to a Procedural area)   3.375 g 12.5 mL/hr over 240 Minutes Intravenous Every 8 hours 12/26/22 1615     12/25/22 2200  piperacillin-tazobactam (ZOSYN) IVPB 2.25 g  Status:  Discontinued        2.25 g 100 mL/hr over 30 Minutes Intravenous Every 8 hours 12/25/22 2112 12/26/22 1615        V. Charlena Cross, M.D., [facility] [place] Office: [phone] Pager:  [phone]

## 2022-12-27 NOTE — Progress Notes (Signed)
General Surgery Follow Up Note  Subjective:    Overnight Issues:   Objective:  Vital signs for last 24 hours: Temp:  [97.5 F (36.4 C)-99.1 F (37.3 C)] 97.5 F (36.4 C) (11/02 0700) Pulse Rate:  [49-117] 53 (11/02 0700) Resp:  [9-28] 25 (11/02 0700) SpO2:  [98 %-100 %] 100 % (11/02 0700) Arterial Line BP: (114-175)/(32-59) 139/39 (11/02 0700) FiO2 (%):  [40 %] 40 % (11/02 0506) Weight:  [89.6 kg] 89.6 kg (11/02 0440)  Hemodynamic parameters for last 24 hours: CVP:  [0 mmHg-13 mmHg] 10 mmHg  Intake/Output from previous day: 11/01 0701 - 11/02 0700 In: 4232 [I.V.:1717.4; IV Piggyback:2494.7] Out: 1017.6 [Drains:435]  Intake/Output this shift: No intake/output data recorded.  Vent settings for last 24 hours: Vent Mode: PRVC FiO2 (%):  [40 %] 40 % Set Rate:  [10 bmp-16 bmp] 10 bmp Vt Set:  [420 mL-560 mL] 420 mL PEEP:  [5 cmH20] 5 cmH20 Plateau Pressure:  [17 cmH20-23 cmH20] 17 cmH20  Physical Exam:  Gen: comfortable, no distress Neuro- sedated, precedex CV: RRR. Levo + vaso Pulm: unlabored breathing on mechanical ventilation Abd: soft, NT, midline vac in place , serosanguinous output GU: anuric , CRRT Extr: wwp, no edema  Results for orders placed or performed during the hospital encounter of 12/25/22 (from the past 24 hour(s))  Global TEG Panel     Status: None   Collection Time: 12/26/22  7:59 AM  Result Value Ref Range   Citrated Kaolin (R) 6.0 4.6 - 9.1 min   Citrated Kaolin (K) 1.4 0.8 - 2.1 min   Citrated Kaolin Angle 70.9 63 - 78 deg   Citrated Kaolin (MA) 58.9 52 - 69 mm   Citrated Rapid TEG (MA) 57.5 52 - 70 mm   CK with Heparinase (R) 5.4 4.3 - 8.3 min   CFF Max Amplitude 19.5 15 - 32 mm   Citrated Functional Fibrinogen 355.8 278 - 581 mg/dL  Glucose, capillary     Status: Abnormal   Collection Time: 12/26/22  7:59 AM  Result Value Ref Range   Glucose-Capillary 220 (H) 70 - 99 mg/dL  CBC     Status: Abnormal   Collection Time: 12/26/22  9:41  AM  Result Value Ref Range   WBC 10.1 4.0 - 10.5 K/uL   RBC 3.52 (L) 4.22 - 5.81 MIL/uL   Hemoglobin 11.0 (L) 13.0 - 17.0 g/dL   HCT 57.3 (L) 22.0 - 25.4 %   MCV 87.8 80.0 - 100.0 fL   MCH 31.3 26.0 - 34.0 pg   MCHC 35.6 30.0 - 36.0 g/dL   RDW 27.0 (H) 62.3 - 76.2 %   Platelets 106 (L) 150 - 400 K/uL   nRBC 0.0 0.0 - 0.2 %  Lactic acid, plasma     Status: Abnormal   Collection Time: 12/26/22 10:28 AM  Result Value Ref Range   Lactic Acid, Venous 2.7 (HH) 0.5 - 1.9 mmol/L  Beta-hydroxybutyric acid     Status: None   Collection Time: 12/26/22 10:28 AM  Result Value Ref Range   Beta-Hydroxybutyric Acid 0.14 0.05 - 0.27 mmol/L  Cortisol     Status: None   Collection Time: 12/26/22 10:35 AM  Result Value Ref Range   Cortisol, Plasma 35.1 ug/dL  Comprehensive metabolic panel     Status: Abnormal   Collection Time: 12/26/22 10:50 AM  Result Value Ref Range   Sodium 135 135 - 145 mmol/L   Potassium 4.8 3.5 - 5.1 mmol/L  Chloride 105 98 - 111 mmol/L   CO2 18 (L) 22 - 32 mmol/L   Glucose, Bld 189 (H) 70 - 99 mg/dL   BUN 34 (H) 8 - 23 mg/dL   Creatinine, Ser 6.96 (H) 0.61 - 1.24 mg/dL   Calcium 8.2 (L) 8.9 - 10.3 mg/dL   Total Protein 5.0 (L) 6.5 - 8.1 g/dL   Albumin 2.3 (L) 3.5 - 5.0 g/dL   AST 54 (H) 15 - 41 U/L   ALT 36 0 - 44 U/L   Alkaline Phosphatase 72 38 - 126 U/L   Total Bilirubin 2.5 (H) 0.3 - 1.2 mg/dL   GFR, Estimated 14 (L) >60 mL/min   Anion gap 12 5 - 15  Glucose, capillary     Status: Abnormal   Collection Time: 12/26/22 11:27 AM  Result Value Ref Range   Glucose-Capillary 187 (H) 70 - 99 mg/dL  Lactic acid, plasma     Status: Abnormal   Collection Time: 12/26/22  1:38 PM  Result Value Ref Range   Lactic Acid, Venous 2.8 (HH) 0.5 - 1.9 mmol/L  Glucose, capillary     Status: Abnormal   Collection Time: 12/26/22  3:28 PM  Result Value Ref Range   Glucose-Capillary 152 (H) 70 - 99 mg/dL  Renal function panel (daily at 1600)     Status: Abnormal   Collection  Time: 12/26/22  3:29 PM  Result Value Ref Range   Sodium 139 135 - 145 mmol/L   Potassium 5.0 3.5 - 5.1 mmol/L   Chloride 107 98 - 111 mmol/L   CO2 20 (L) 22 - 32 mmol/L   Glucose, Bld 156 (H) 70 - 99 mg/dL   BUN 38 (H) 8 - 23 mg/dL   Creatinine, Ser 2.95 (H) 0.61 - 1.24 mg/dL   Calcium 8.5 (L) 8.9 - 10.3 mg/dL   Phosphorus 5.1 (H) 2.5 - 4.6 mg/dL   Albumin 2.2 (L) 3.5 - 5.0 g/dL   GFR, Estimated 14 (L) >60 mL/min   Anion gap 12 5 - 15  I-STAT 7, (LYTES, BLD GAS, ICA, H+H)     Status: Abnormal   Collection Time: 12/26/22  4:06 PM  Result Value Ref Range   pH, Arterial 7.520 (H) 7.35 - 7.45   pCO2 arterial 25.4 (L) 32 - 48 mmHg   pO2, Arterial 174 (H) 83 - 108 mmHg   Bicarbonate 20.7 20.0 - 28.0 mmol/L   TCO2 21 (L) 22 - 32 mmol/L   O2 Saturation 100 %   Acid-base deficit 1.0 0.0 - 2.0 mmol/L   Sodium 138 135 - 145 mmol/L   Potassium 4.8 3.5 - 5.1 mmol/L   Calcium, Ion 1.08 (L) 1.15 - 1.40 mmol/L   HCT 28.0 (L) 39.0 - 52.0 %   Hemoglobin 9.5 (L) 13.0 - 17.0 g/dL   Patient temperature 28.4 C    Sample type ARTERIAL   CG4 I-STAT (Lactic acid)     Status: Abnormal   Collection Time: 12/26/22  4:09 PM  Result Value Ref Range   Lactic Acid, Venous 2.1 (HH) 0.5 - 1.9 mmol/L   Comment NOTIFIED PHYSICIAN   CBC     Status: Abnormal   Collection Time: 12/26/22  4:58 PM  Result Value Ref Range   WBC 10.8 (H) 4.0 - 10.5 K/uL   RBC 2.79 (L) 4.22 - 5.81 MIL/uL   Hemoglobin 8.8 (L) 13.0 - 17.0 g/dL   HCT 13.2 (L) 44.0 - 10.2 %   MCV 87.5 80.0 -  100.0 fL   MCH 31.5 26.0 - 34.0 pg   MCHC 36.1 (H) 30.0 - 36.0 g/dL   RDW 29.5 (H) 28.4 - 13.2 %   Platelets 102 (L) 150 - 400 K/uL   nRBC 0.0 0.0 - 0.2 %  Lactic acid, plasma     Status: Abnormal   Collection Time: 12/26/22  5:00 PM  Result Value Ref Range   Lactic Acid, Venous 2.6 (HH) 0.5 - 1.9 mmol/L  I-STAT 7, (LYTES, BLD GAS, ICA, H+H)     Status: Abnormal   Collection Time: 12/26/22  5:05 PM  Result Value Ref Range   pH, Arterial  7.501 (H) 7.35 - 7.45   pCO2 arterial 26.1 (L) 32 - 48 mmHg   pO2, Arterial 167 (H) 83 - 108 mmHg   Bicarbonate 20.4 20.0 - 28.0 mmol/L   TCO2 21 (L) 22 - 32 mmol/L   O2 Saturation 100 %   Acid-base deficit 2.0 0.0 - 2.0 mmol/L   Sodium 138 135 - 145 mmol/L   Potassium 4.7 3.5 - 5.1 mmol/L   Calcium, Ion 1.12 (L) 1.15 - 1.40 mmol/L   HCT 24.0 (L) 39.0 - 52.0 %   Hemoglobin 8.2 (L) 13.0 - 17.0 g/dL   Patient temperature 44.0 C    Sample type ARTERIAL   Heparin level (unfractionated)     Status: Abnormal   Collection Time: 12/26/22  8:01 PM  Result Value Ref Range   Heparin Unfractionated 0.17 (L) 0.30 - 0.70 IU/mL  Lactic acid, plasma     Status: None   Collection Time: 12/26/22  8:01 PM  Result Value Ref Range   Lactic Acid, Venous 1.7 0.5 - 1.9 mmol/L  CBC with Differential/Platelet     Status: Abnormal   Collection Time: 12/26/22  8:01 PM  Result Value Ref Range   WBC 12.7 (H) 4.0 - 10.5 K/uL   RBC 3.04 (L) 4.22 - 5.81 MIL/uL   Hemoglobin 9.6 (L) 13.0 - 17.0 g/dL   HCT 10.2 (L) 72.5 - 36.6 %   MCV 87.8 80.0 - 100.0 fL   MCH 31.6 26.0 - 34.0 pg   MCHC 36.0 30.0 - 36.0 g/dL   RDW 44.0 (H) 34.7 - 42.5 %   Platelets 117 (L) 150 - 400 K/uL   nRBC 0.2 0.0 - 0.2 %   Neutrophils Relative % 84 %   Neutro Abs 10.7 (H) 1.7 - 7.7 K/uL   Lymphocytes Relative 5 %   Lymphs Abs 0.6 (L) 0.7 - 4.0 K/uL   Monocytes Relative 10 %   Monocytes Absolute 1.3 (H) 0.1 - 1.0 K/uL   Eosinophils Relative 0 %   Eosinophils Absolute 0.0 0.0 - 0.5 K/uL   Basophils Relative 0 %   Basophils Absolute 0.0 0.0 - 0.1 K/uL   Immature Granulocytes 1 %   Abs Immature Granulocytes 0.07 0.00 - 0.07 K/uL  Glucose, capillary     Status: Abnormal   Collection Time: 12/26/22  8:05 PM  Result Value Ref Range   Glucose-Capillary 136 (H) 70 - 99 mg/dL  I-STAT 7, (LYTES, BLD GAS, ICA, H+H)     Status: Abnormal   Collection Time: 12/26/22  8:07 PM  Result Value Ref Range   pH, Arterial 7.380 7.35 - 7.45   pCO2  arterial 37.3 32 - 48 mmHg   pO2, Arterial 162 (H) 83 - 108 mmHg   Bicarbonate 22.1 20.0 - 28.0 mmol/L   TCO2 23 22 - 32 mmol/L   O2  Saturation 99 %   Acid-base deficit 3.0 (H) 0.0 - 2.0 mmol/L   Sodium 135 135 - 145 mmol/L   Potassium 5.0 3.5 - 5.1 mmol/L   Calcium, Ion 1.13 (L) 1.15 - 1.40 mmol/L   HCT 25.0 (L) 39.0 - 52.0 %   Hemoglobin 8.5 (L) 13.0 - 17.0 g/dL   Patient temperature 16.1 C    Sample type ARTERIAL   Glucose, capillary     Status: Abnormal   Collection Time: 12/27/22 12:00 AM  Result Value Ref Range   Glucose-Capillary 136 (H) 70 - 99 mg/dL  Triglycerides     Status: None   Collection Time: 12/27/22  3:27 AM  Result Value Ref Range   Triglycerides 29 <150 mg/dL  Renal function panel (daily at 0500)     Status: Abnormal   Collection Time: 12/27/22  3:27 AM  Result Value Ref Range   Sodium 135 135 - 145 mmol/L   Potassium 5.0 3.5 - 5.1 mmol/L   Chloride 105 98 - 111 mmol/L   CO2 22 22 - 32 mmol/L   Glucose, Bld 134 (H) 70 - 99 mg/dL   BUN 27 (H) 8 - 23 mg/dL   Creatinine, Ser 0.96 (H) 0.61 - 1.24 mg/dL   Calcium 8.1 (L) 8.9 - 10.3 mg/dL   Phosphorus 4.3 2.5 - 4.6 mg/dL   Albumin 2.0 (L) 3.5 - 5.0 g/dL   GFR, Estimated 24 (L) >60 mL/min   Anion gap 8 5 - 15  Magnesium     Status: None   Collection Time: 12/27/22  3:27 AM  Result Value Ref Range   Magnesium 2.0 1.7 - 2.4 mg/dL  Hepatic function panel     Status: Abnormal   Collection Time: 12/27/22  3:27 AM  Result Value Ref Range   Total Protein 5.1 (L) 6.5 - 8.1 g/dL   Albumin 2.0 (L) 3.5 - 5.0 g/dL   AST 51 (H) 15 - 41 U/L   ALT 35 0 - 44 U/L   Alkaline Phosphatase 66 38 - 126 U/L   Total Bilirubin 1.2 0.3 - 1.2 mg/dL   Bilirubin, Direct 0.6 (H) 0.0 - 0.2 mg/dL   Indirect Bilirubin 0.6 0.3 - 0.9 mg/dL  CBC     Status: Abnormal   Collection Time: 12/27/22  3:27 AM  Result Value Ref Range   WBC 13.0 (H) 4.0 - 10.5 K/uL   RBC 2.91 (L) 4.22 - 5.81 MIL/uL   Hemoglobin 9.1 (L) 13.0 - 17.0 g/dL   HCT  04.5 (L) 40.9 - 52.0 %   MCV 88.3 80.0 - 100.0 fL   MCH 31.3 26.0 - 34.0 pg   MCHC 35.4 30.0 - 36.0 g/dL   RDW 81.1 (H) 91.4 - 78.2 %   Platelets 139 (L) 150 - 400 K/uL   nRBC 0.2 0.0 - 0.2 %  Heparin level (unfractionated)     Status: Abnormal   Collection Time: 12/27/22  3:27 AM  Result Value Ref Range   Heparin Unfractionated 0.17 (L) 0.30 - 0.70 IU/mL  Glucose, capillary     Status: Abnormal   Collection Time: 12/27/22  3:52 AM  Result Value Ref Range   Glucose-Capillary 132 (H) 70 - 99 mg/dL    Assessment & Plan: The plan of care was discussed with the bedside nurse for the day, who is in agreement with this plan and no additional concerns were raised.   Present on Admission:  Acute mesenteric ischemia (HCC)    LOS: 2  days   Additional comments:I reviewed the patient's new clinical lab test results.   and I reviewed the patients new imaging test results.    Acute mesenteric ischemia - s/p exlap, R hemicolectomy by Dr. Azucena Cecil 11/1 just after midnight. Left in discontinuity with abthera. Also underwent revascularization with angioplasty of SMA by Dr. Lenell Antu. Plan takeback to OR today for second look; pressor requirement is stable to slightly decreased. Discussed with family possibility of washout and further resection vs ileostomy creation and closure. Anastomosis unlikely to heal given physiologic state/ongoing shock/ multiorgan dysfunction at this point FEN - strict NPO, no meds/feeds due to intestinal discontinuity. Recommend volume resuscitation with LR bolus and initiation of LR MIVF. Recommend CRRT for clearance, no volume removal yet.  DVT - SCDs,  heparin gtt as above Dispo - ICU    Berna Bue MD General Surgery Please use [email or web address] to contact on call provider  12/27/2022  *Care during the described time interval was provided by me. I have reviewed this patient's available data, including medical history, events of note, physical examination and test results as  part of my evaluation.

## 2022-12-27 NOTE — Anesthesia Preprocedure Evaluation (Signed)
Anesthesia Evaluation  Patient identified by MRN, date of birth, ID band Patient awake    Reviewed: Allergy & Precautions, NPO status , Patient's Chart, lab work & pertinent test results  Airway Mallampati: II  TM Distance: >3 FB Neck ROM: Full    Dental  (+) Dental Advisory Given   Pulmonary shortness of breath, sleep apnea , COPD, former smoker Restrictive lung dz 2/2 asbestosis    breath sounds clear to auscultation       Cardiovascular hypertension, Pt. on medications + CAD, + Cardiac Stents, + CABG, + Peripheral Vascular Disease and +CHF  + dysrhythmias Atrial Fibrillation  Rhythm:Regular Rate:Normal     Neuro/Psych negative neurological ROS     GI/Hepatic Neg liver ROS,,,mesenteric ischemia   Endo/Other  negative endocrine ROS    Renal/GU ESRF and DialysisRenal disease     Musculoskeletal   Abdominal   Peds  Hematology  (+) Blood dyscrasia (on Plavix)   Anesthesia Other Findings   Reproductive/Obstetrics                              Lab Results  Component Value Date   WBC 13.0 (H) 12/27/2022   HGB 9.1 (L) 12/27/2022   HCT 25.7 (L) 12/27/2022   MCV 88.3 12/27/2022   PLT 139 (L) 12/27/2022   Lab Results  Component Value Date   NA 135 12/27/2022   CL 105 12/27/2022   K 5.0 12/27/2022   CO2 22 12/27/2022   BUN 27 (H) 12/27/2022   CREATININE 2.58 (H) 12/27/2022   GFRNONAA 24 (L) 12/27/2022   CALCIUM 8.1 (L) 12/27/2022   PHOS 4.3 12/27/2022   ALBUMIN 2.0 (L) 12/27/2022   ALBUMIN 2.0 (L) 12/27/2022   GLUCOSE 134 (H) 12/27/2022    Anesthesia Physical Anesthesia Plan  ASA: 4  Anesthesia Plan: General   Post-op Pain Management:    Induction: Intravenous  PONV Risk Score and Plan: 2 and Dexamethasone, Ondansetron and Treatment may vary due to age or medical condition  Airway Management Planned: Oral ETT  Additional Equipment:   Intra-op Plan:   Post-operative  Plan: Possible Post-op intubation/ventilation  Informed Consent: I have reviewed the patients History and Physical, chart, labs and discussed the procedure including the risks, benefits and alternatives for the proposed anesthesia with the patient or authorized representative who has indicated his/her understanding and acceptance.     Dental advisory given  Plan Discussed with: CRNA  Anesthesia Plan Comments: (7.5 ETT, minimal vent settings Right internal jugular CVL Right radial arterial line)         Anesthesia Quick Evaluation

## 2022-12-28 ENCOUNTER — Encounter (HOSPITAL_COMMUNITY): Payer: Self-pay | Admitting: Surgery

## 2022-12-28 DIAGNOSIS — K55059 Acute (reversible) ischemia of intestine, part and extent unspecified: Secondary | ICD-10-CM | POA: Diagnosis not present

## 2022-12-28 DIAGNOSIS — R579 Shock, unspecified: Secondary | ICD-10-CM | POA: Diagnosis not present

## 2022-12-28 DIAGNOSIS — K551 Chronic vascular disorders of intestine: Secondary | ICD-10-CM | POA: Diagnosis not present

## 2022-12-28 DIAGNOSIS — Z9889 Other specified postprocedural states: Secondary | ICD-10-CM | POA: Diagnosis not present

## 2022-12-28 DIAGNOSIS — E440 Moderate protein-calorie malnutrition: Secondary | ICD-10-CM | POA: Diagnosis not present

## 2022-12-28 LAB — RENAL FUNCTION PANEL
Albumin: 1.7 g/dL — ABNORMAL LOW (ref 3.5–5.0)
Anion gap: 8 (ref 5–15)
BUN: 22 mg/dL (ref 8–23)
CO2: 23 mmol/L (ref 22–32)
Calcium: 8.1 mg/dL — ABNORMAL LOW (ref 8.9–10.3)
Chloride: 105 mmol/L (ref 98–111)
Creatinine, Ser: 1.78 mg/dL — ABNORMAL HIGH (ref 0.61–1.24)
GFR, Estimated: 38 mL/min — ABNORMAL LOW (ref >60)
Glucose, Bld: 122 mg/dL — ABNORMAL HIGH (ref 70–99)
Phosphorus: 4.1 mg/dL (ref 2.5–4.6)
Potassium: 4.6 mmol/L (ref 3.5–5.1)
Sodium: 136 mmol/L (ref 135–145)

## 2022-12-28 LAB — GLUCOSE, CAPILLARY
Glucose-Capillary: 127 mg/dL — ABNORMAL HIGH (ref 70–99)
Glucose-Capillary: 116 mg/dL — ABNORMAL HIGH (ref 70–99)
Glucose-Capillary: 107 mg/dL — ABNORMAL HIGH (ref 70–99)
Glucose-Capillary: 110 mg/dL — ABNORMAL HIGH (ref 70–99)
Glucose-Capillary: 98 mg/dL (ref 70–99)
Glucose-Capillary: 115 mg/dL — ABNORMAL HIGH (ref 70–99)

## 2022-12-28 LAB — CBC WITH DIFFERENTIAL/PLATELET
Abs Immature Granulocytes: 0.09 10*3/uL — ABNORMAL HIGH (ref 0.00–0.07)
Basophils Absolute: 0 10*3/uL (ref 0.0–0.1)
Basophils Relative: 0 %
Eosinophils Absolute: 0 10*3/uL (ref 0.0–0.5)
Eosinophils Relative: 0 %
HCT: 26 % — ABNORMAL LOW (ref 39.0–52.0)
Hemoglobin: 8.7 g/dL — ABNORMAL LOW (ref 13.0–17.0)
Immature Granulocytes: 1 %
Lymphocytes Relative: 5 %
Lymphs Abs: 0.5 10*3/uL — ABNORMAL LOW (ref 0.7–4.0)
MCH: 31.2 pg (ref 26.0–34.0)
MCHC: 33.5 g/dL (ref 30.0–36.0)
MCV: 93.2 fL (ref 80.0–100.0)
Monocytes Absolute: 1 10*3/uL (ref 0.1–1.0)
Monocytes Relative: 9 %
Neutro Abs: 9.6 10*3/uL — ABNORMAL HIGH (ref 1.7–7.7)
Neutrophils Relative %: 85 %
Platelets: 142 10*3/uL — ABNORMAL LOW (ref 150–400)
RBC: 2.79 MIL/uL — ABNORMAL LOW (ref 4.22–5.81)
RDW: 16.1 % — ABNORMAL HIGH (ref 11.5–15.5)
WBC: 11.2 10*3/uL — ABNORMAL HIGH (ref 4.0–10.5)
nRBC: 1.6 % — ABNORMAL HIGH (ref 0.0–0.2)

## 2022-12-28 LAB — HEPATIC FUNCTION PANEL
ALT: 40 U/L (ref 0–44)
AST: 64 U/L — ABNORMAL HIGH (ref 15–41)
Albumin: 1.7 g/dL — ABNORMAL LOW (ref 3.5–5.0)
Alkaline Phosphatase: 79 U/L (ref 38–126)
Bilirubin, Direct: 0.4 mg/dL — ABNORMAL HIGH (ref 0.0–0.2)
Indirect Bilirubin: 0.5 mg/dL (ref 0.3–0.9)
Total Bilirubin: 0.9 mg/dL (ref 0.3–1.2)
Total Protein: 5 g/dL — ABNORMAL LOW (ref 6.5–8.1)

## 2022-12-28 LAB — POCT I-STAT 7, (LYTES, BLD GAS, ICA,H+H)
Acid-base deficit: 1 mmol/L (ref 0.0–2.0)
Bicarbonate: 24.4 mmol/L (ref 20.0–28.0)
Calcium, Ion: 1.17 mmol/L (ref 1.15–1.40)
HCT: 23 % — ABNORMAL LOW (ref 39.0–52.0)
Hemoglobin: 7.8 g/dL — ABNORMAL LOW (ref 13.0–17.0)
O2 Saturation: 100 %
Patient temperature: 36.7
Potassium: 4.4 mmol/L (ref 3.5–5.1)
Sodium: 136 mmol/L (ref 135–145)
TCO2: 26 mmol/L (ref 22–32)
pCO2 arterial: 43 mm[Hg] (ref 32–48)
pH, Arterial: 7.361 (ref 7.35–7.45)
pO2, Arterial: 189 mm[Hg] — ABNORMAL HIGH (ref 83–108)

## 2022-12-28 LAB — HEPARIN LEVEL (UNFRACTIONATED)
Heparin Unfractionated: 0.25 [IU]/mL — ABNORMAL LOW (ref 0.30–0.70)
Heparin Unfractionated: 0.29 [IU]/mL — ABNORMAL LOW (ref 0.30–0.70)

## 2022-12-28 LAB — PREPARE RBC (CROSSMATCH)

## 2022-12-28 LAB — CBC
HCT: 21.8 % — ABNORMAL LOW (ref 39.0–52.0)
Hemoglobin: 7.2 g/dL — ABNORMAL LOW (ref 13.0–17.0)
MCH: 30.6 pg (ref 26.0–34.0)
MCHC: 33 g/dL (ref 30.0–36.0)
MCV: 92.8 fL (ref 80.0–100.0)
Platelets: 155 10*3/uL (ref 150–400)
RBC: 2.35 MIL/uL — ABNORMAL LOW (ref 4.22–5.81)
RDW: 16.9 % — ABNORMAL HIGH (ref 11.5–15.5)
WBC: 12.7 10*3/uL — ABNORMAL HIGH (ref 4.0–10.5)
nRBC: 0.9 % — ABNORMAL HIGH (ref 0.0–0.2)

## 2022-12-28 LAB — MAGNESIUM: Magnesium: 2.3 mg/dL (ref 1.7–2.4)

## 2022-12-28 MED ORDER — FAT EMUL FISH OIL/PLANT BASED 20% (SMOFLIPID)IV EMUL
250.0000 mL | INTRAVENOUS | Status: AC
  Administered 2022-12-28: 250 mL via INTRAVENOUS
  Filled 2022-12-28: qty 250

## 2022-12-28 MED ORDER — SODIUM CHLORIDE 0.9% IV SOLUTION: Freq: Once | INTRAVENOUS | Status: DC

## 2022-12-28 MED ORDER — INFUVITE ADULT IV SOLN
INTRAVENOUS | Status: AC
  Filled 2022-12-28 (×2): qty 1000

## 2022-12-28 NOTE — Progress Notes (Signed)
PHARMACY - TOTAL PARENTERAL NUTRITION CONSULT NOTE   Indication:  bowel discontinuity  Patient Measurements: Height: 5\' 9"  (175.3 cm) Weight: 89.2 kg (196 lb 10.4 oz) IBW/kg (Calculated) : 70.7 TPN AdjBW (KG): 81.2 Body mass index is 29.04 kg/m.  Assessment: 82 years of age male with moderate malnutrition at baseline due to chronic illness including ESRD on HD who presented with acute mesenteric ischemia s/p ex lap with R hemicolectomy, angioplasty and stenting of SMA, wound VAC placement, and bowel left in discontinuity on 11/1. Pharmacy consulted for TPN.   Glucose / Insulin: CBGs <180. 8 units mSSI utilized last 24 hours. Note given Decadron 10mg  on 10/31 and 8 mg on 11/2 - no further orders.  Electrolytes: K 4.6, Phos 4.1, Mg 2.3, CoCa 9.9. Others within normal limits.  Renal: Requiring CRRT Hepatic: AST up at 64. Others within normal limits. Tbili normal.  Intake / Output; MIVF: 0 UOP. CRRT removal 977.6 mL. NGT output 50 mL. Wound Vac output 25 mL. JP driain output 385 mL. Net +5.9L.  GI Imaging: None since start of TPN GI Surgeries / Procedures:  None since start of TPN  Central access: CVC (double lumen) placed 11/1 - RN to dedicate lumen for TPN. TPN start date: 12/28/22  Nutritional Goals: Clinimix 8/10 2 L without Lytes x 7 days @ 82 mL/hr - SMOFlipid 250 mL daily Will provide 100% AA + 83% kcal needs Monitor and consider if should be exception for compounded TPN to help with volume.   RD Assessment: Estimated Needs Total Energy Estimated Needs: 2200-2400 Total Protein Estimated Needs: 160-200 gm Total Fluid Estimated Needs: 1 L + UOP  Current Nutrition:  NPO and TPN  Plan:  Start Clinimix 8/10 at 1800 for first day: - 1 L without Lytes @ 42 mL/hr - SMOFlipid 250 mL daily  Will provide 50% AA and 30% of kcal needs. If tolerates, will plan to titrate up to goal tomorrow.   No electrolytes for now with AKI-CRRT needs. Will re-assess daily.   Add standard MVI  and trace elements to TPN Continue Moderate q4h SSI and adjust as needed  Monitor TPN labs daily while on Clinimix, TGs qMon   Link Snuffer, PharmD, BCPS, BCCCP Please refer to [facility] for [facility] numbers 12/28/2022,10:58 AM

## 2022-12-28 NOTE — Progress Notes (Signed)
    Subjective  -   Patient went back to the operating room yesterday with general surgery for additional small bowel resection and end ileostomy.   Physical Exam:  Remains intubated and sedated       Assessment/Plan:    Mesenteric ischemia: The patient has undergone additional bowel resection and end ileostomy yesterday.  He remained stable from a vascular perspective.  He should be started on aspirin, statin, Plavix when appropriate  Peter Oneill 12/28/2022 8:25 AM --  Vitals:   12/28/22 0659 12/28/22 0700  BP:    Pulse: 69 69  Resp: 17 16  Temp: 98.2 F (36.8 C) 98.2 F (36.8 C)  SpO2: 100% 100%    Intake/Output Summary (Last 24 hours) at 12/28/2022 0825 Last data filed at 12/28/2022 0800 Gross per 24 hour  Intake 2102.15 ml  Output 1715.6 ml  Net 386.55 ml     Laboratory CBC    Component Value Date/Time   WBC 12.7 (H) 12/28/2022 0513   HGB 7.2 (L) 12/28/2022 0513   HGB 11.4 (L) 07/12/2021 1057   HCT 21.8 (L) 12/28/2022 0513   HCT 33.5 (L) 07/12/2021 1057   PLT 155 12/28/2022 0513   PLT 212 07/12/2021 1057    BMET    Component Value Date/Time   NA 136 12/28/2022 0513   K 4.6 12/28/2022 0513   CL 105 12/28/2022 0513   CO2 23 12/28/2022 0513   GLUCOSE 122 (H) 12/28/2022 0513   BUN 22 12/28/2022 0513   CREATININE 1.78 (H) 12/28/2022 0513   CALCIUM 8.1 (L) 12/28/2022 0513   CALCIUM 8.2 (L) 05/15/2021 0750   GFRNONAA 38 (L) 12/28/2022 0513    COAG Lab Results  Component Value Date   INR 1.6 (H) 12/26/2022   No results found for: "PTT"  Antibiotics Anti-infectives (From admission, onward)    Start     Dose/Rate Route Frequency Ordered Stop   12/26/22 1800  piperacillin-tazobactam (ZOSYN) IVPB 3.375 g        3.375 g 12.5 mL/hr over 240 Minutes Intravenous Every 8 hours 12/26/22 1615     12/25/22 2200  piperacillin-tazobactam (ZOSYN) IVPB 2.25 g  Status:  Discontinued        2.25 g 100 mL/hr over 30 Minutes Intravenous Every 8 hours  12/25/22 2112 12/26/22 1615        V. Charlena Cross, M.D., [facility] [place] Office: [phone] Pager:  [phone]

## 2022-12-28 NOTE — Progress Notes (Signed)
General Surgery Follow Up Note  Subjective:    Overnight Issues: No acute events.  Is off vaso this morning  Objective:  Vital signs for last 24 hours: Temp:  [96.3 F (35.7 C)-99.5 F (37.5 C)] 98.2 F (36.8 C) (11/03 0700) Pulse Rate:  [49-85] 69 (11/03 0700) Resp:  [11-26] 16 (11/03 0700) BP: (88-127)/(31-88) 121/40 (11/03 0000) SpO2:  [73 %-100 %] 100 % (11/03 0700) Arterial Line BP: (60-168)/(29-80) 135/40 (11/03 0700) FiO2 (%):  [40 %] 40 % (11/03 0417) Weight:  [89.2 kg] 89.2 kg (11/03 0500)  Hemodynamic parameters for last 24 hours: CVP:  [6 mmHg-18 mmHg] 13 mmHg  Intake/Output from previous day: 11/02 0701 - 11/03 0700 In: 2079.8 [I.V.:1861.2; NG/GT:20; IV Piggyback:198.6] Out: 1537.6 [Emesis/NG output:50; Drains:410; Blood:100]  Intake/Output this shift: No intake/output data recorded.  Vent settings for last 24 hours: Vent Mode: PRVC FiO2 (%):  [40 %] 40 % Set Rate:  [10 bmp-14 bmp] 14 bmp Vt Set:  [420 mL-490 mL] 490 mL PEEP:  [5 cmH20] 5 cmH20 Plateau Pressure:  [17 cmH20-19 cmH20] 18 cmH20  Physical Exam:  Gen: comfortable, no distress Neuro- sedated, precedex CV: RRR. Levo  Pulm: unlabored breathing on mechanical ventilation Abd: soft, nondistended, wound is clean and dry.  Right lower quadrant JP output serosanguineous.  Ostomy is viable, flush with the skin with sweat in the bag GU: anuric , CRRT Extr: wwp, no edema  Results for orders placed or performed during the hospital encounter of 12/25/22 (from the past 24 hour(s))  Glucose, capillary     Status: Abnormal   Collection Time: 12/27/22 11:33 AM  Result Value Ref Range   Glucose-Capillary 112 (H) 70 - 99 mg/dL  Renal function panel (daily at 1600)     Status: Abnormal   Collection Time: 12/27/22  4:17 PM  Result Value Ref Range   Sodium 137 135 - 145 mmol/L   Potassium 5.4 (H) 3.5 - 5.1 mmol/L   Chloride 105 98 - 111 mmol/L   CO2 22 22 - 32 mmol/L   Glucose, Bld 148 (H) 70 - 99 mg/dL    BUN 29 (H) 8 - 23 mg/dL   Creatinine, Ser 9.52 (H) 0.61 - 1.24 mg/dL   Calcium 8.1 (L) 8.9 - 10.3 mg/dL   Phosphorus 5.0 (H) 2.5 - 4.6 mg/dL   Albumin 1.8 (L) 3.5 - 5.0 g/dL   GFR, Estimated 29 (L) >60 mL/min   Anion gap 10 5 - 15  Glucose, capillary     Status: Abnormal   Collection Time: 12/27/22  4:22 PM  Result Value Ref Range   Glucose-Capillary 134 (H) 70 - 99 mg/dL  I-STAT 7, (LYTES, BLD GAS, ICA, H+H)     Status: Abnormal   Collection Time: 12/27/22  5:25 PM  Result Value Ref Range   pH, Arterial 7.262 (L) 7.35 - 7.45   pCO2 arterial 55.9 (H) 32 - 48 mmHg   pO2, Arterial 160 (H) 83 - 108 mmHg   Bicarbonate 25.1 20.0 - 28.0 mmol/L   TCO2 27 22 - 32 mmol/L   O2 Saturation 99 %   Acid-base deficit 2.0 0.0 - 2.0 mmol/L   Sodium 135 135 - 145 mmol/L   Potassium 5.4 (H) 3.5 - 5.1 mmol/L   Calcium, Ion 1.17 1.15 - 1.40 mmol/L   HCT 21.0 (L) 39.0 - 52.0 %   Hemoglobin 7.1 (L) 13.0 - 17.0 g/dL   Patient temperature 84.1 C    Sample type ARTERIAL  Glucose, capillary     Status: Abnormal   Collection Time: 12/27/22  8:00 PM  Result Value Ref Range   Glucose-Capillary 126 (H) 70 - 99 mg/dL  Heparin level (unfractionated)     Status: Abnormal   Collection Time: 12/27/22  9:00 PM  Result Value Ref Range   Heparin Unfractionated 0.13 (L) 0.30 - 0.70 IU/mL  Glucose, capillary     Status: Abnormal   Collection Time: 12/27/22 11:42 PM  Result Value Ref Range   Glucose-Capillary 116 (H) 70 - 99 mg/dL  Glucose, capillary     Status: Abnormal   Collection Time: 12/28/22  4:46 AM  Result Value Ref Range   Glucose-Capillary 127 (H) 70 - 99 mg/dL  Renal function panel (daily at 0500)     Status: Abnormal   Collection Time: 12/28/22  5:13 AM  Result Value Ref Range   Sodium 136 135 - 145 mmol/L   Potassium 4.6 3.5 - 5.1 mmol/L   Chloride 105 98 - 111 mmol/L   CO2 23 22 - 32 mmol/L   Glucose, Bld 122 (H) 70 - 99 mg/dL   BUN 22 8 - 23 mg/dL   Creatinine, Ser 1.61 (H) 0.61 - 1.24  mg/dL   Calcium 8.1 (L) 8.9 - 10.3 mg/dL   Phosphorus 4.1 2.5 - 4.6 mg/dL   Albumin 1.7 (L) 3.5 - 5.0 g/dL   GFR, Estimated 38 (L) >60 mL/min   Anion gap 8 5 - 15  Magnesium     Status: None   Collection Time: 12/28/22  5:13 AM  Result Value Ref Range   Magnesium 2.3 1.7 - 2.4 mg/dL  Hepatic function panel     Status: Abnormal   Collection Time: 12/28/22  5:13 AM  Result Value Ref Range   Total Protein 5.0 (L) 6.5 - 8.1 g/dL   Albumin 1.7 (L) 3.5 - 5.0 g/dL   AST 64 (H) 15 - 41 U/L   ALT 40 0 - 44 U/L   Alkaline Phosphatase 79 38 - 126 U/L   Total Bilirubin 0.9 0.3 - 1.2 mg/dL   Bilirubin, Direct 0.4 (H) 0.0 - 0.2 mg/dL   Indirect Bilirubin 0.5 0.3 - 0.9 mg/dL  CBC     Status: Abnormal   Collection Time: 12/28/22  5:13 AM  Result Value Ref Range   WBC 12.7 (H) 4.0 - 10.5 K/uL   RBC 2.35 (L) 4.22 - 5.81 MIL/uL   Hemoglobin 7.2 (L) 13.0 - 17.0 g/dL   HCT 09.6 (L) 04.5 - 40.9 %   MCV 92.8 80.0 - 100.0 fL   MCH 30.6 26.0 - 34.0 pg   MCHC 33.0 30.0 - 36.0 g/dL   RDW 81.1 (H) 91.4 - 78.2 %   Platelets 155 150 - 400 K/uL   nRBC 0.9 (H) 0.0 - 0.2 %    Assessment & Plan: The plan of care was discussed with the bedside nurse for the day, who is in agreement with this plan and no additional concerns were raised.   Present on Admission:  Acute mesenteric ischemia (HCC)    LOS: 3 days   Additional comments:I reviewed the patient's new clinical lab test results.   and I reviewed the patients new imaging test results.    Acute mesenteric ischemia -  -s/p exlap, R hemicolectomy by Dr. Azucena Cecil 11/1 just after midnight. Left in discontinuity with abthera. Also underwent revascularization with angioplasty of SMA by Dr. Lenell Antu.  -S/p washout, end ileostomy, JP drain placement and abdominal  closure 11/2 -Await bowel function (ostomy output), would not start tube feeds at this point  FEN - OK for meds per tube DVT - SCDs,  heparin gtt as above Dispo - ICU    Berna Bue  MD General Surgery Please use [email or web address] to contact on call provider  12/28/2022  *Care during the described time interval was provided by me. I have reviewed this patient's available data, including medical history, events of note, physical examination and test results as part of my evaluation.

## 2022-12-28 NOTE — Progress Notes (Signed)
NAME:  Peter Oneill, MRN:  [id number], DOB:  16-Apr-1940, LOS: 3 ADMISSION DATE:  12/25/2022, CONSULTATION DATE:  12/28/22 REFERRING MD:  EDP, CHIEF COMPLAINT:  Abdominal Pain   History of Present Illness:  Peter Oneill is a 82 y.o. M with PMH if ESRD, Asbestosis, CAD s/p CABG, HTN, HL, OSA and emphysema followed by Dr. [name] who presented to the ED with acutely worsening abdominal pain that began the day of admission.  He underwent dialysis in the morning and then felt short of breath followed by abdominal discomfort.  His initial oxygen saturation was 70% on EMS arrival and his work-up in the ED included a CT Abd/pelvis concerning for acute mesenteric ischemia secondary to occlusion of the celiac artery and significant stenosis of the superior mesenteric artery and small inferior mesenteric artery.  His labs showed a lactic acid of 1.4, WBC 6.3, creatinine 3.7, BNP >1300, normal Hgb.  He was taken emergently to the OR for urgent revascularization and SMA stenting along with ex-lap.  PCCM asked to admit  Pertinent  Medical History   has a past medical history of Asbestosis (HCC), Contraindication to percutaneous coronary intervention (PCI), Coronary artery disease, Dyslipidemia, Dyspnea, ESRD on hemodialysis (HCC), History of anemia, Hypercholesterolemia, Hypertension, Ileus (HCC), Pleural effusion on left, Pneumonia, and Sleep apnea.   Significant Hospital Events: Including procedures, antibiotic start and stop dates in addition to other pertinent events   10/31 presented with mesenteric ischemia, occlusion of the celiac and significant stenosis of the SMA and small inferior mesenteric artery. Went to OR for urgent revascularization and SMA stenting along with ex-lap. Right hemicolectomy for necrotic cecum. Significant pressor requirement and bleeding (800 cc 1st hour) from abdominal wound vac post-op, given 4L PRBC's, 500cc albumin, 500cc NS and was on Levophed , Neo , Vaso 0.04  and Epi . Pressors were titrated down with resuscitation.  11/1. CVL placed for pressors. LE warm . Weaning pressors. Orthostatic w/ positional change. 2.2 liters positive. Changing prop to dex. Drain out decreased   Interim History / Subjective:  No overnight issues Vasopressin was titrated off Currently on Levophed of 6 mics Remain on CRRT Remain in A-fib with controlled rate  Objective   Blood pressure (!) 105/41, pulse 73, temperature 97.7 F (36.5 C), resp. rate 16, height 5\' 9"  (1.753 m), weight 89.2 kg, SpO2 100%. CVP:  [6 mmHg-18 mmHg] 8 mmHg  Vent Mode: PRVC FiO2 (%):  [40 %] 40 % Set Rate:  [10 bmp-14 bmp] 14 bmp Vt Set:  [420 mL-490 mL] 490 mL PEEP:  [5 cmH20] 5 cmH20 Plateau Pressure:  [17 cmH20-22 cmH20] 22 cmH20   Intake/Output Summary (Last 24 hours) at 12/28/2022 1017 Last data filed at 12/28/2022 1000 Gross per 24 hour  Intake 1674.31 ml  Output 1851.6 ml  Net -177.29 ml   Filed Weights   12/25/22 1807 12/27/22 0440 12/28/22 0500  Weight: 81.2 kg 89.6 kg 89.2 kg   Physical exam: General: Crtitically ill-appearing elderly male, orally intubated HEENT: [place]/AT, eyes anicteric.  ETT and OGT in place Neuro: Sedated, not following commands.  Eyes are closed.  Pupils 3 mm bilateral reactive to light Chest: Coarse breath sounds, no wheezes or rhonchi Heart: Irregularly irregular, no murmurs or gallops Abdomen: Soft, nondistended, ileostomy is present with small amount of brown liquid in the ostomy bag, absent bowel sounds Skin: No rash  Labs and images reviewed  Resolved Hospital Problem list     Assessment & Plan:  Acute mesenteric ischemia with ischemic  bowel status post ex lap x 2, resection of large bowel with and ileostomy and angioplasty of SMA Shock, mixed septic/vasoplegic/hypovolemic Metabolic and respiratory Acute respiratory failure with hypoxia and hypercapnia Expected postop blood loss anemia and thrombocytopenia End-stage renal disease on  hemodialysis Paroxysmal A-fib with slow rate Moderate malnutrition  Appreciate general and vascular surgery follow-up Patient went back to the OR on 11/2 for abdominal washout and resection of large bowel and part of terminal ileum due to ischemia and end ileostomy JP drain output was 410 cc bloody fluid Monitor JP drain output Continue IV heparin infusion Once off pressors, consider resuming aspirin, statin and possible Plavix Remain in shock with wide pulse pressure, still on Levophed but requirement is better than yesterday, currently on 6 mics Off vasopressin Repeat ABG after vent setting was done last night His hemoglobin dropped down to 7, will transfuse 1 unit Monitor H&H and transfuse to keep Hb 8 Currently on CRRT with net even fluid balance Continue IV antibiotic with Zosyn Continue lung protective ventilation VAP prevention bundle in place PAD protocol with fentanyl and Precedex with RASS goal -1 Titrate off Precedex today Patient remain in A-fib with slow rate, heart rate 60s Off amiodarone now due to very low heart rate Start TPN  Best Practice (right click and "Reselect all SmartList Selections" daily)   Diet/type: NPO start TPN DVT prophylaxis: Systemic heparin GI prophylaxis: H2B Lines: Central line Foley:  Yes, and it is still needed Code Status:  full code Last date of multidisciplinary goals of care discussion: 11/1: Patient's family was updated at bedside, decision was to continue full scope of care   The patient is critically ill due to shock/acute mesenteric ischemia with ischemic bowel.  Critical care was necessary to treat or prevent imminent or life-threatening deterioration.  Critical care was time spent personally by me on the following activities: development of treatment plan with patient and/or surrogate as well as nursing, discussions with consultants, evaluation of patient's response to treatment, examination of patient, obtaining history from  patient or surrogate, ordering and performing treatments and interventions, ordering and review of laboratory studies, ordering and review of radiographic studies, pulse oximetry, re-evaluation of patient's condition and participation in multidisciplinary rounds.   During this encounter critical care time was devoted to patient care services described in this note for 35 minutes.    Cheri Fowler, MD [facility] See Amion for pager If no response to pager, please call [phone] until 7pm After 7pm, Please call E-link [phone]

## 2022-12-28 NOTE — Consult Note (Signed)
WOC consulted for new ileostomy; placed on follow up list for this week  Patrick Sohm [facility], CNS, [employer] [phone]

## 2022-12-28 NOTE — Progress Notes (Signed)
PHARMACY - ANTICOAGULATION CONSULT NOTE  Pharmacy Consult for Heparin  Indication: Mesenteric ischemia s/p angioplasty/stenting of SMA  Allergies  Allergen Reactions   Atorvastatin Other (See Comments)    Muscle aches    Patient Measurements: Height: 5\' 9"  (175.3 cm) Weight: 89.2 kg (196 lb 10.4 oz) IBW/kg (Calculated) : 70.7  Vital Signs: Temp: 99 F (37.2 C) (11/03 1830) Temp Source: Rectal (11/03 1535) BP: 94/43 (11/03 1600) Pulse Rate: 80 (11/03 1830)  Labs: Recent Labs    12/26/22 0300 12/26/22 0507 12/27/22 0327 12/27/22 1617 12/27/22 1725 12/27/22 2100 12/28/22 0513 12/28/22 0744 12/28/22 1225 12/28/22 1646 12/28/22 1833  HGB  --    < > 9.1*  --    < >  --  7.2*  --  7.8* 8.7*  --   HCT  --    < > 25.7*  --    < >  --  21.8*  --  23.0* 26.0*  --   PLT  --    < > 139*  --   --   --  155  --   --  142*  --   APTT 63*  --   --   --   --   --   --   --   --   --   --   LABPROT 19.5*  --   --   --   --   --   --   --   --   --   --   INR 1.6*  --   --   --   --   --   --   --   --   --   --   HEPARINUNFRC  --    < > 0.17*  --   --  0.13*  --  0.25*  --   --  0.29*  CREATININE  --    < > 2.58* 2.24*  --   --  1.78*  --   --   --   --    < > = values in this interval not displayed.    Estimated Creatinine Clearance: 36 mL/min (A) (by C-G formula based on SCr of 1.78 mg/dL (H)).   Medical History: Past Medical History:  Diagnosis Date   Asbestosis (HCC)    Contraindication to percutaneous coronary intervention (PCI)    Coronary artery disease    Dyslipidemia    Dyspnea    ESRD on hemodialysis (HCC)    History of anemia    Hypercholesterolemia    Hypertension    Ileus (HCC)    Pleural effusion on left    Pneumonia    Sleep apnea     Assessment: 82 y/o M with mesenteric ischemia s/p OR with vascular and general surgery. S/P angioplasty and stenting of the SMA and right hemi-colectomy.  Heparin level 0.29 is within established low goal range.  No  issues with infusion or bleeding per RN. Wound vac has minimal serosanguinous output.    Goal of Therapy:  Heparin level 0.2-0.5 units/ml Monitor platelets by anticoagulation protocol: Yes   Plan:   Continue IV heparin at 1100 units/hr Monitor daily heparin level, CBC, signs/symptoms of bleeding   Reece Leader, Colon Flattery, [facility] Clinical Pharmacist  12/28/2022 7:26 PM   [facility] pharmacy phone numbers are listed on [email or web address]

## 2022-12-28 NOTE — Progress Notes (Signed)
Received patient from OR around 2am on NE, NEO, and vaso. Blood began to dump into the wound vac, 400-500 ml/hr. BP quickly dropped and pressors increased 2 mcgs every 1-2 minutes to bridge until blood and volume could be started. Verbal orders from [facility] MD, CCM to increase levophed first, then neo if needed; vasopressin to be maintained at 0.04. Epi due to be started. 4uRBCs, 2 - of 5% albumin, 500 mL LR bolus, 1g calcium gluconate ordered and given. Blood/wound vac output continued at 41ml/hr loss for 1-2 hours. 1 FFP ordered and heparin gtt held (discussed with CCM, VVS and gen surgery). After all blood administration, wound vac output decreased and pressors were able to come down. CCM orders to wean neo off as soon as possible, wanting support from on EPI, NE, and vaso. Fentanyl gtt started for RASS goal of -2; patient waking up and bearing down and subsequently dropped BP. Verbal orders bolus to decrease occurrences of BP drops and to increase continuous dose in preparation for imminent central line placement.

## 2022-12-28 NOTE — Progress Notes (Signed)
PHARMACY - ANTICOAGULATION CONSULT NOTE  Pharmacy Consult for Heparin  Indication: Mesenteric ischemia s/p angioplasty/stenting of SMA  Allergies  Allergen Reactions   Atorvastatin Other (See Comments)    Muscle aches    Patient Measurements: Height: 5\' 9"  (175.3 cm) Weight: 89.2 kg (196 lb 10.4 oz) IBW/kg (Calculated) : 70.7  Vital Signs: Temp: 98.1 F (36.7 C) (11/03 1140) Temp Source: Rectal (11/03 1137) BP: 105/41 (11/03 0800) Pulse Rate: 73 (11/03 1140)  Labs: Recent Labs    12/25/22 1603 12/25/22 1815 12/25/22 2311 12/26/22 0300 12/26/22 0507 12/26/22 2001 12/26/22 2007 12/27/22 0327 12/27/22 1617 12/27/22 1725 12/27/22 2100 12/28/22 0513 12/28/22 0744  HGB 11.2*  --    < >  --    < > 9.6*   < > 9.1*  --  7.1*  --  7.2*  --   HCT 35.3*  --    < >  --    < > 26.7*   < > 25.7*  --  21.0*  --  21.8*  --   PLT 233  --    < >  --    < > 117*  --  139*  --   --   --  155  --   APTT  --   --   --  63*  --   --   --   --   --   --   --   --   --   LABPROT  --   --   --  19.5*  --   --   --   --   --   --   --   --   --   INR  --   --   --  1.6*  --   --   --   --   --   --   --   --   --   HEPARINUNFRC  --   --   --   --    < > 0.17*  --  0.17*  --   --  0.13*  --  0.25*  CREATININE 3.71*  --    < >  --    < >  --   --  2.58* 2.24*  --   --  1.78*  --   TROPONINIHS 80* 74*  --   --   --   --   --   --   --   --   --   --   --    < > = values in this interval not displayed.    Estimated Creatinine Clearance: 36 mL/min (A) (by C-G formula based on SCr of 1.78 mg/dL (H)).   Medical History: Past Medical History:  Diagnosis Date   Asbestosis (HCC)    Contraindication to percutaneous coronary intervention (PCI)    Coronary artery disease    Dyslipidemia    Dyspnea    ESRD on hemodialysis (HCC)    History of anemia    Hypercholesterolemia    Hypertension    Ileus (HCC)    Pleural effusion on left    Pneumonia    Sleep apnea     Assessment: 82 y/o M  with mesenteric ischemia s/p OR with vascular and general surgery. S/P angioplasty and stenting of the SMA and right hemi-colectomy.  Heparin level 0.25 is within established low goal range.  No issues with infusion or bleeding per RN. Wound vac has minimal serosanguinous  output.    Goal of Therapy:  Heparin level 0.2-0.5 units/ml Monitor platelets by anticoagulation protocol: Yes   Plan:   Increase heparin gtt just slightly to 1100 units/hr to keep mid-range. Check heparin level 8 hrs after gtt restarts. Monitor daily heparin level, CBC, signs/symptoms of bleeding   Reece Leader, Colon Flattery, [facility] Clinical Pharmacist  12/28/2022 12:00 PM   [facility] pharmacy phone numbers are listed on [email or web address]

## 2022-12-28 NOTE — Plan of Care (Signed)
  Problem: Education: Goal: Knowledge of General Education information will improve Description: Including pain rating scale, medication(s)/side effects and non-pharmacologic comfort measures Outcome: Progressing   Problem: Activity: Goal: Risk for activity intolerance will decrease Outcome: Progressing   Problem: Elimination: Goal: Will not experience complications related to urinary retention Outcome: Progressing   Problem: Skin Integrity: Goal: Risk for impaired skin integrity will decrease Outcome: Progressing

## 2022-12-28 NOTE — Progress Notes (Signed)
[facility] Progress Note  Subjective: seen in ICU, RN states they weren't able to UF 50-75 cc/hr reliably every hour. CVP this am is 6.   Vitals:   12/28/22 1015 12/28/22 1030 12/28/22 1045 12/28/22 1137  BP:      Pulse: 71 72 75 72  Resp: 18 14 16 14   Temp: 97.9 F (36.6 C) 97.9 F (36.6 C) 97.9 F (36.6 C) 98.1 F (36.7 C)  TempSrc:    Rectal  SpO2: 100% 100% 100% 100%  Weight:      Height:        Exam: Gen on vent, sedated Sclera anicteric, throat w/ ETT No jvd or bruits Chest clear anterior/ lateral RRR no RG Abd +dressing Ext no pitting LE / UE edema Neuro is on vent, sedated    LUA AVF+bruit     Renal-related home meds: - plavix - renavite - velphoro 500 ac tid - norvasc 5 every day - others: statin, zetia, allopurinol, rapaflo      OP HD: TTS NW 4h   350/1.5    79.6kg   2/2 bath   AVF   Heparin none - last OP HD 10/31, post wt 79.5kg - getting to dry wt, good compliance w/ HD - rocaltrol 0.5 mcg  - mircera 30 mcg IV q 4 wks, last 10/19, due 11/16     Assessment/ Plan: Acute mesenteric ischemia - sp R colon resection, revascularization of the SMA on 11/01. Bowel left in discontinuity. Back to OR on 11/02. Per gen surgery/ VVS.  Shock - likely combination of hemorrhagic shock and vasoplegia. Levo gtt at 6- 8 micrograms/min.  ABL anemia - Hb 7s today again. Getting 5th prbc this am.  ESKD - on HD TTS. Has not missed OP HD. CRRT started 11/01. Cont CRRT Volume - wt's are up but not sig edematous, CVP 6-8. Will keep even today.  Anemia of eskd - last esa given on 10/19 at OP unit. Follow. Tranfusing prn.  MBD ckd - CCa and phos in range. Can continue po vdra thru the tube. Hold vephoro/ binder until eating again. Hyperkalemia - changed dialysate to 2K w/ improved K+ down to 4.6 this am      Vinson Moselle MD  [facility] 12/28/2022, 11:42 AM  Recent Labs  Lab 12/27/22 1617 12/27/22 1725 12/28/22 0513  HGB  --  7.1* 7.2*  ALBUMIN 1.8*  --   1.7*  1.7*  CALCIUM 8.1*  --  8.1*  PHOS 5.0*  --  4.1  CREATININE 2.24*  --  1.78*  K 5.4* 5.4* 4.6   No results for input(s): "IRON", "TIBC", "FERRITIN" in the last 168 hours. Inpatient medications:  sodium chloride   Intravenous Once   arformoterol  15 mcg Nebulization BID   Chlorhexidine Gluconate Cloth  6 each Topical Daily   doxercalciferol  4 mcg Intravenous Q M,W,F   insulin aspart  0-15 Units Subcutaneous Q4H   latanoprost  1 drop Both Eyes QHS   mouth rinse  15 mL Mouth Rinse Q2H   revefenacin  175 mcg Nebulization Daily   sodium chloride flush  10-40 mL Intracatheter Q12H     prismasol BGK 4/2.5 400 mL/hr at 12/27/22 2219    prismasol BGK 4/2.5 400 mL/hr at 12/27/22 2219   sodium chloride     dexmedetomidine (PRECEDEX) IV infusion 0.9 mcg/kg/hr (12/28/22 1100)   famotidine (PEPCID) IV 100 mL/hr at 12/28/22 0200   fentaNYL infusion INTRAVENOUS 175 mcg/hr (12/28/22 1100)  heparin 1,100 Units/hr (12/28/22 1109)   norepinephrine (LEVOPHED) Adult infusion 6 mcg/min (12/28/22 1100)   piperacillin-tazobactam (ZOSYN)  IV 12.5 mL/hr at 12/28/22 1100   prismasol BGK 2/2.5 dialysis solution 1,500 mL/hr at 12/28/22 1118   vasopressin Stopped (12/27/22 2345)   fentaNYL, heparin, midazolam, mouth rinse, sodium chloride flush, white petrolatum

## 2022-12-28 NOTE — Progress Notes (Signed)
Pharmacy Antibiotic Note  Peter Oneill is a 82 y.o. male admitted on 12/25/2022 with  intra-abdominal infection, concern for mesenteric ischemia .  Pharmacy has been consulted for zosyn dosing. Noted ESRD - last dialysis on 10/31.  Currently on CRRT.  Plan: Continue Zosyn 3.375g IV q 8 hrs (CRRT dosing)  Height: 5\' 9"  (175.3 cm) Weight: 89.2 kg (196 lb 10.4 oz) IBW/kg (Calculated) : 70.7  Temp (24hrs), Avg:98.6 F (37 C), Min:97.2 F (36.2 C), Max:99.5 F (37.5 C)  Recent Labs  Lab 12/26/22 0941 12/26/22 1028 12/26/22 1050 12/26/22 1338 12/26/22 1529 12/26/22 1609 12/26/22 1658 12/26/22 1700 12/26/22 2001 12/27/22 0327 12/27/22 1617 12/28/22 0513  WBC 10.1  --   --   --   --   --  10.8*  --  12.7* 13.0*  --  12.7*  CREATININE  --   --  4.18*  --  4.18*  --   --   --   --  2.58* 2.24* 1.78*  LATICACIDVEN  --  2.7*  --  2.8*  --  2.1*  --  2.6* 1.7  --   --   --     Estimated Creatinine Clearance: 36 mL/min (A) (by C-G formula based on SCr of 1.78 mg/dL (H)).    Allergies  Allergen Reactions   Atorvastatin Other (See Comments)    Muscle aches   No cultures.  Thank you for allowing pharmacy to be a part of this patient's care.  Reece Leader, Colon Flattery, [facility] Clinical Pharmacist  12/28/2022 11:58 AM   [facility] pharmacy phone numbers are listed on [email or web address]

## 2022-12-29 ENCOUNTER — Ambulatory Visit: Payer: Medicare HMO | Admitting: Interventional Cardiology

## 2022-12-29 DIAGNOSIS — R579 Shock, unspecified: Secondary | ICD-10-CM | POA: Diagnosis not present

## 2022-12-29 DIAGNOSIS — K551 Chronic vascular disorders of intestine: Secondary | ICD-10-CM | POA: Diagnosis not present

## 2022-12-29 LAB — COMPREHENSIVE METABOLIC PANEL
ALT: 43 U/L (ref 0–44)
AST: 62 U/L — ABNORMAL HIGH (ref 15–41)
Albumin: 1.8 g/dL — ABNORMAL LOW (ref 3.5–5.0)
Alkaline Phosphatase: 81 U/L (ref 38–126)
Anion gap: 7 (ref 5–15)
BUN: 20 mg/dL (ref 8–23)
CO2: 24 mmol/L (ref 22–32)
Calcium: 8 mg/dL — ABNORMAL LOW (ref 8.9–10.3)
Chloride: 102 mmol/L (ref 98–111)
Creatinine, Ser: 1.55 mg/dL — ABNORMAL HIGH (ref 0.61–1.24)
GFR, Estimated: 45 mL/min — ABNORMAL LOW (ref >60)
Glucose, Bld: 143 mg/dL — ABNORMAL HIGH (ref 70–99)
Potassium: 3.8 mmol/L (ref 3.5–5.1)
Sodium: 133 mmol/L — ABNORMAL LOW (ref 135–145)
Total Bilirubin: 0.8 mg/dL (ref <1.2)
Total Protein: 5.2 g/dL — ABNORMAL LOW (ref 6.5–8.1)

## 2022-12-29 LAB — TYPE AND SCREEN
ABO/RH(D): O POS
Antibody Screen: NEGATIVE
Unit division: 0
Unit division: 0
Unit division: 0
Unit division: 0
Unit division: 0
Unit division: 0
Unit division: 0
Unit division: 0

## 2022-12-29 LAB — CBC
HCT: 25.4 % — ABNORMAL LOW (ref 39.0–52.0)
Hemoglobin: 8.3 g/dL — ABNORMAL LOW (ref 13.0–17.0)
MCH: 30.7 pg (ref 26.0–34.0)
MCHC: 32.7 g/dL (ref 30.0–36.0)
MCV: 94.1 fL (ref 80.0–100.0)
Platelets: 151 10*3/uL (ref 150–400)
RBC: 2.7 MIL/uL — ABNORMAL LOW (ref 4.22–5.81)
RDW: 16.7 % — ABNORMAL HIGH (ref 11.5–15.5)
WBC: 10.9 10*3/uL — ABNORMAL HIGH (ref 4.0–10.5)
nRBC: 5.1 % — ABNORMAL HIGH (ref 0.0–0.2)

## 2022-12-29 LAB — PHOSPHORUS: Phosphorus: 2.8 mg/dL (ref 2.5–4.6)

## 2022-12-29 LAB — BPAM RBC
Blood Product Expiration Date: 202411202359
Blood Product Expiration Date: 202411252359
Blood Product Expiration Date: 202411252359
Blood Product Expiration Date: 202411252359
Blood Product Expiration Date: 202411252359
Blood Product Expiration Date: 202411252359
Blood Product Expiration Date: 202411252359
Blood Product Expiration Date: 202411202359
ISSUE DATE / TIME: 202411010108
ISSUE DATE / TIME: 202411010240
ISSUE DATE / TIME: 202411010240
ISSUE DATE / TIME: 202411010315
ISSUE DATE / TIME: 202411010108
ISSUE DATE / TIME: 202411010315
ISSUE DATE / TIME: 202411031132
Unit Type and Rh: 5100
Unit Type and Rh: 5100
Unit Type and Rh: 5100
Unit Type and Rh: 5100
Unit Type and Rh: 5100
Unit Type and Rh: 5100
Unit Type and Rh: 5100
Unit Type and Rh: 5100
Unit Type and Rh: 5100
Unit Type and Rh: 5100
Unit Type and Rh: 5100

## 2022-12-29 LAB — GLUCOSE, CAPILLARY
Glucose-Capillary: 109 mg/dL — ABNORMAL HIGH (ref 70–99)
Glucose-Capillary: 118 mg/dL — ABNORMAL HIGH (ref 70–99)
Glucose-Capillary: 127 mg/dL — ABNORMAL HIGH (ref 70–99)
Glucose-Capillary: 130 mg/dL — ABNORMAL HIGH (ref 70–99)
Glucose-Capillary: 136 mg/dL — ABNORMAL HIGH (ref 70–99)
Glucose-Capillary: 76 mg/dL (ref 70–99)
Glucose-Capillary: 91 mg/dL (ref 70–99)

## 2022-12-29 LAB — HEPARIN LEVEL (UNFRACTIONATED): Heparin Unfractionated: 0.24 [IU]/mL — ABNORMAL LOW (ref 0.30–0.70)

## 2022-12-29 LAB — TRIGLYCERIDES: Triglycerides: 119 mg/dL (ref <150)

## 2022-12-29 LAB — MAGNESIUM: Magnesium: 2.4 mg/dL (ref 1.7–2.4)

## 2022-12-29 MED ORDER — FAT EMUL FISH OIL/PLANT BASED 20% (SMOFLIPID)IV EMUL
250.0000 mL | INTRAVENOUS | Status: AC
  Administered 2022-12-29: 250 mL via INTRAVENOUS
  Filled 2022-12-29: qty 250

## 2022-12-29 MED ORDER — ALBUMIN HUMAN 5 % IV SOLN
25.0000 g | Freq: Once | INTRAVENOUS | Status: AC
  Administered 2022-12-29: 25 g via INTRAVENOUS
  Filled 2022-12-29: qty 500

## 2022-12-29 MED ORDER — PRISMASOL BGK 4/2.5 32-4-2.5 MEQ/L EC SOLN
Status: DC
  Filled 2022-12-29 (×25): qty 5000

## 2022-12-29 MED ORDER — DARBEPOETIN ALFA 40 MCG/0.4ML IJ SOSY
40.0000 ug | PREFILLED_SYRINGE | INTRAMUSCULAR | Status: DC
  Administered 2022-12-29 – 2023-01-05 (×2): 40 ug via SUBCUTANEOUS
  Filled 2022-12-29 (×2): qty 0.4

## 2022-12-29 MED ORDER — TRACE MINERALS CU-MN-SE-ZN 300-55-60-3000 MCG/ML IV SOLN
INTRAVENOUS | Status: AC
  Filled 2022-12-29 (×2): qty 2000

## 2022-12-29 NOTE — Plan of Care (Signed)

## 2022-12-29 NOTE — Progress Notes (Signed)
PHARMACY - ANTICOAGULATION CONSULT NOTE  Pharmacy Consult for Heparin  Indication: Mesenteric ischemia s/p angioplasty/stenting of SMA  Allergies  Allergen Reactions   Atorvastatin Other (See Comments)    Muscle aches    Patient Measurements: Height: 5\' 9"  (175.3 cm) Weight: 84.5 kg (186 lb 4.6 oz) IBW/kg (Calculated) : 70.7  Vital Signs: Temp: 99 F (37.2 C) (11/04 0810) Temp Source: Rectal (11/04 0800) BP: 115/46 (11/04 0810) Pulse Rate: 86 (11/04 0810)  Labs: Recent Labs    12/27/22 1617 12/27/22 1725 12/28/22 0513 12/28/22 0744 12/28/22 1225 12/28/22 1646 12/28/22 1833 12/29/22 0352 12/29/22 0741  HGB  --    < > 7.2*  --  7.8* 8.7*  --  8.3*  --   HCT  --    < > 21.8*  --  23.0* 26.0*  --  25.4*  --   PLT  --   --  155  --   --  142*  --  151  --   HEPARINUNFRC  --    < >  --  0.25*  --   --  0.29*  --  0.24*  CREATININE 2.24*  --  1.78*  --   --   --   --  1.55*  --    < > = values in this interval not displayed.    Estimated Creatinine Clearance: 37.4 mL/min (A) (by C-G formula based on SCr of 1.55 mg/dL (H)).   Medical History: Past Medical History:  Diagnosis Date   Asbestosis (HCC)    Contraindication to percutaneous coronary intervention (PCI)    Coronary artery disease    Dyslipidemia    Dyspnea    ESRD on hemodialysis (HCC)    History of anemia    Hypercholesterolemia    Hypertension    Ileus (HCC)    Pleural effusion on left    Pneumonia    Sleep apnea     Assessment: 82 y/o M with mesenteric ischemia s/p OR with vascular and general surgery. S/P angioplasty and stenting of the SMA and right hemi-colectomy.  Heparin level 0.24 is within established low goal range.  No issues with infusion or bleeding per RN.   Goal of Therapy:  Heparin level 0.2-0.5 units/ml Monitor platelets by anticoagulation protocol: Yes   Plan:   Continue IV heparin at 1100 units/hr Monitor daily heparin level, CBC, signs/symptoms of bleeding   Trixie Rude, PharmD Clinical Pharmacist 12/29/2022  8:40 AM

## 2022-12-29 NOTE — Progress Notes (Addendum)
  Progress Note    12/29/2022 7:26 AM 2 Days Post-Op  Subjective:  intubated, responsive to commands and light touch    Vitals:   12/29/22 0600 12/29/22 0611  BP:    Pulse: 74 80  Resp: 16 20  Temp: 99 F (37.2 C) 99 F (37.2 C)  SpO2: 100% 100%    Physical Exam: General:  on vent, sedated Cardiac:  regular Incisions:  midline abdominal incision with dry dressing Extremities:  DP doppler signals Abdomen:  soft, nondistended. Ileostomy in place  CBC    Component Value Date/Time   WBC 10.9 (H) 12/29/2022 0352   RBC 2.70 (L) 12/29/2022 0352   HGB 8.3 (L) 12/29/2022 0352   HGB 11.4 (L) 07/12/2021 1057   HCT 25.4 (L) 12/29/2022 0352   HCT 33.5 (L) 07/12/2021 1057   PLT 151 12/29/2022 0352   PLT 212 07/12/2021 1057   MCV 94.1 12/29/2022 0352   MCV 90 07/12/2021 1057   MCH 30.7 12/29/2022 0352   MCHC 32.7 12/29/2022 0352   RDW 16.7 (H) 12/29/2022 0352   RDW 15.8 (H) 07/12/2021 1057   LYMPHSABS 0.5 (L) 12/28/2022 1646   MONOABS 1.0 12/28/2022 1646   EOSABS 0.0 12/28/2022 1646   BASOSABS 0.0 12/28/2022 1646    BMET    Component Value Date/Time   NA 133 (L) 12/29/2022 0352   K 3.8 12/29/2022 0352   CL 102 12/29/2022 0352   CO2 24 12/29/2022 0352   GLUCOSE 143 (H) 12/29/2022 0352   BUN 20 12/29/2022 0352   CREATININE 1.55 (H) 12/29/2022 0352   CALCIUM 8.0 (L) 12/29/2022 0352   CALCIUM 8.2 (L) 05/15/2021 0750   GFRNONAA 45 (L) 12/29/2022 0352    INR    Component Value Date/Time   INR 1.6 (H) 12/26/2022 0300     Intake/Output Summary (Last 24 hours) at 12/29/2022 0726 Last data filed at 12/29/2022 0600 Gross per 24 hour  Intake 2594.83 ml  Output 2721 ml  Net -126.17 ml      Assessment/Plan:  82 y.o. male is s/p: SMA stenting, exploratory laparotomy, and ileostomy   -Remains intubated and on vent. Will move extremities on command and in response to light touch -Abd is soft and nondistended. Ileostomy in place with liquid stool. Midline incision  with dry dressing -BLE well perfused with DP doppler signals -On heparin gtt. Eventually will need ASA, plavix, and statin for stent patency   Loel Dubonnet PA-C [facility] [phone] 12/29/2022 7:26 AM   VASCULAR STAFF ADDENDUM: I have independently interviewed and examined the patient. I agree with the above.  Start ASA / Plavix / Statin as soon as safe from GS perspective.   Rande Brunt. Lenell Antu, MD [facility] Phone Number: [phone] 12/29/2022 12:11 PM

## 2022-12-29 NOTE — Progress Notes (Signed)
Nutrition Follow-up  DOCUMENTATION CODES:   Non-severe (moderate) malnutrition in context of chronic illness  INTERVENTION:  - TPN management per pharmacy   - When medically feasible may want to consider enteral nutrition via trickle feeds, recommend: Vital 1.5 @ 20 ml/hr and increase per pt's tolerance to goal rate of 60 ml/h.  Prosource TF20 60 ml QID. Provides 2480 kcal, 177 gm protein, 1100 ml free water daily.  NUTRITION DIAGNOSIS:   Moderate Malnutrition related to chronic illness (ESRD on HD) as evidenced by mild muscle depletion, mild fat depletion.  - Ongoing, being addressed by TPN   GOAL:   Patient will meet greater than or equal to 90% of their needs  - Ongoing  MONITOR:   Vent status, I & O's  REASON FOR ASSESSMENT:   Consult New TPN/TNA  ASSESSMENT:   82 yo male admitted with acute mesenteric ischemia. PMH includes HTN, HLD, CAD, CABG, sleep apnea, ESRD on HD, asbestos exposure.  11/1 - S/P angioplasty and stenting of SMA. 11/1 - S/P ex lap with R hemicolectomy and wound VAC placement, bowel left in discontinuity. 11/2 - S/P small bowel resection, creation of end ileostomy, JP drain  11/3 - TPN initiated 11/4 - Weaning pressors. SBT.    Patient is currently intubated on ventilator support MV: 14 L/min Temp (24hrs), Avg:98.9 F (37.2 C), Min:98.5 F (36.9 C), Max:99.1 F (37.3 C)  Propofol: Discontinued  Pt still on CRRT, pressors weaning (off vaso), conducting SBT. Pt tolerated SBT for a little bit but not ready for liberation today. Conducting SBT daily.  Pt producing some output from ostomy. If increasing output may want to consider trickle feeds via enteral access within the next 48 hours due to TPN not meeting pt's increased nutrient needs.   EDW: 79.5 kg   Drains/Lines: Ileostomy 40 ml x 24 hrs   Nutritionally Relevant Medications: Scheduled Meds:  darbepoetin (ARANESP) injection - DIALYSIS  40 mcg Subcutaneous Q Mon-1800    doxercalciferol  4 mcg Intravenous Q M,W,F   insulin aspart  0-15 Units Subcutaneous Q4H   Continuous Infusions:   prismasol BGK 4/2.5 400 mL/hr at 12/29/22 0031    prismasol BGK 4/2.5 400 mL/hr at 12/29/22 0030   dexmedetomidine (PRECEDEX) IV infusion Stopped (12/28/22 1502)   fentaNYL infusion INTRAVENOUS 75 mcg/hr (12/29/22 0700)   norepinephrine (LEVOPHED) Adult infusion 14 mcg/min (12/29/22 0700)   piperacillin-tazobactam (ZOSYN)  IV Stopped (12/29/22 0651)   prismasol BGK 4/2.5 1,400 mL/hr at 12/29/22 0655   TPN (CLINIMIX) Adult without lytes 42 mL/hr at 12/29/22 0700   vasopressin Stopped (12/27/22 2345)   Labs Reviewed: Sodium 133 Creatinine 1.55 Calcium 8  CBG ranges from 76-136 mg/dL over the last 24 hours HgbA1c 5.4  Diet Order:   Diet Order             Diet NPO time specified  Diet effective now                   EDUCATION NEEDS:   Not appropriate for education at this time  Skin:  Skin Assessment: Skin Integrity Issues: Skin Integrity Issues:: Wound VAC, Other (Comment) Wound Vac: abdominal incision Other: L ear laceration  Last BM:  11/4  Height:   Ht Readings from Last 1 Encounters:  12/25/22 5\' 9"  (1.753 m)    Weight:   Wt Readings from Last 1 Encounters:  12/29/22 84.5 kg    Ideal Body Weight:  72.7 kg  BMI:  Body mass index is 27.51  kg/m.  Estimated Nutritional Needs:   Kcal:  2400-2600  Protein:  160-200 g  Fluid:  1 L + UOP  Elliot Dally, RD Registered Dietitian  See Amion for more information

## 2022-12-29 NOTE — Progress Notes (Signed)
[facility] Progress Note  Subjective: seen in ICU. Stable evening. Was more responsive overnight than he has been.  I/O yest: net neg 433 cc Pressors: levo @ 10-15  FiO2: 0.40, vent   Vitals:   12/29/22 0500 12/29/22 0530 12/29/22 0600 12/29/22 0611  BP:      Pulse: 77 76 74 80  Resp: (!) 27 (!) 21 16 20   Temp: 98.8 F (37.1 C) 98.8 F (37.1 C) 99 F (37.2 C) 99 F (37.2 C)  TempSrc:      SpO2: 100% 100% 100% 100%  Weight: 84.5 kg     Height:        Exam: Gen on vent, sedated Sclera anicteric, throat w/ ETT No jvd or bruits Chest clear anterior/ lateral RRR no RG Abd +dressing Ext trace UE edema Neuro is on vent, sedated    LUA AVF+bruit     Renal-related home meds: - plavix - renavite - velphoro 500 ac tid - norvasc 5 every day - others: statin, zetia, allopurinol, rapaflo      OP HD: TTS NW 4h   350/1.5    79.6kg   2/2 bath   AVF   Heparin none - last OP HD 10/31, post wt 79.5kg - getting to dry wt, good compliance w/ HD - rocaltrol 0.5 mcg  - mircera 30 mcg IV q 4 wks, last 10/19, due 11/16     Assessment/ Plan: Acute mesenteric ischemia - sp R hemicolectomy, revascularization of SMA on 11/01. SP washout, end ileostomy, drain/ abd closure on 11/02. Per gen surgery/ VVS.  Shock - levo gtt @ 10-15 today ABL anemia - Hb 7s today again. S/P 5u prbc's.  ESKD - on HD TTS. CRRT started 11/01 due to shock. Cont CRRT Volume - trace/mild edema, CVP 6-8. Cont to keep even. 5-6kg over.  Anemia of eskd - last esa low-dose 10/19 at OP unit. SP 5u prbc's. Will start darbe 40 mcg sq weekly while here.  MBD ckd - CCa and phos in range. Binders when eating.  Hypokalemia - have changed to all 4/2.5 today       Vinson Moselle MD  [facility] 12/29/2022, 6:45 AM  Recent Labs  Lab 12/28/22 0513 12/28/22 1225 12/28/22 1646 12/29/22 0352  HGB 7.2* 7.8* 8.7* 8.3*  ALBUMIN 1.7*  1.7*  --   --  1.8*  CALCIUM 8.1*  --   --  8.0*  PHOS 4.1  --   --  2.8   CREATININE 1.78*  --   --  1.55*  K 4.6 4.4  --  3.8   No results for input(s): "IRON", "TIBC", "FERRITIN" in the last 168 hours. Inpatient medications:  sodium chloride   Intravenous Once   arformoterol  15 mcg Nebulization BID   Chlorhexidine Gluconate Cloth  6 each Topical Daily   doxercalciferol  4 mcg Intravenous Q M,W,F   insulin aspart  0-15 Units Subcutaneous Q4H   latanoprost  1 drop Both Eyes QHS   mouth rinse  15 mL Mouth Rinse Q2H   revefenacin  175 mcg Nebulization Daily   sodium chloride flush  10-40 mL Intracatheter Q12H     prismasol BGK 4/2.5 400 mL/hr at 12/29/22 0031    prismasol BGK 4/2.5 400 mL/hr at 12/29/22 0030   sodium chloride 10 mL/hr (12/29/22 0600)   dexmedetomidine (PRECEDEX) IV infusion Stopped (12/28/22 1502)   famotidine (PEPCID) IV Stopped (12/29/22 0246)   fentaNYL infusion INTRAVENOUS 75 mcg/hr (12/29/22 0600)   heparin  1,100 Units/hr (12/29/22 0600)   norepinephrine (LEVOPHED) Adult infusion 17 mcg/min (12/29/22 0600)   piperacillin-tazobactam (ZOSYN)  IV 12.5 mL/hr at 12/29/22 0600   prismasol BGK 4/2.5     TPN (CLINIMIX) Adult without lytes 42 mL/hr at 12/29/22 0600   vasopressin Stopped (12/27/22 2345)   fentaNYL, heparin, midazolam, mouth rinse, sodium chloride flush, white petrolatum

## 2022-12-29 NOTE — Progress Notes (Signed)
PHARMACY - TOTAL PARENTERAL NUTRITION CONSULT NOTE   Indication:  bowel discontinuity  Patient Measurements: Height: 5\' 9"  (175.3 cm) Weight: 84.5 kg (186 lb 4.6 oz) IBW/kg (Calculated) : 70.7 TPN AdjBW (KG): 81.2 Body mass index is 27.51 kg/m.  Assessment: 82 years of age male with moderate malnutrition at baseline due to chronic illness including ESRD on HD who presented with acute mesenteric ischemia s/p ex lap with R hemicolectomy, angioplasty and stenting of SMA, wound VAC placement, and bowel left in discontinuity on 11/1. Pharmacy consulted for TPN.   Glucose / Insulin: CBGs <180. 2 units mSSI utilized last 24 hours. Note given Decadron 10mg  on 10/31 and 8 mg on 11/2 - no further orders.  Electrolytes: K 3.8, Phos 2.8, Mg 2.4, CoCa 9.9. Others within normal limits. TG 119  Renal: Requiring CRRT Hepatic: AST up at 62. Others within normal limits. Tbili normal.  Intake / Output; MIVF: 0 UOP. CRRT removal 2590 mL. NGT output 110 mL. JP driain output 180 mL. Net +5.6L.  GI Imaging: None since start of TPN GI Surgeries / Procedures:  None since start of TPN  Central access: CVC (double lumen) placed 11/1 - RN to dedicate lumen for TPN. TPN start date: 12/28/22  Nutritional Goals: Clinimix 8/10 2 L without Lytes x 7 days @ 82 mL/hr - SMOFlipid 250 mL daily Will provide 100% AA + 83% kcal needs Monitor and consider if should be exception for compounded TPN to help with volume.   RD Assessment: Estimated Needs Total Energy Estimated Needs: 2200-2400 Total Protein Estimated Needs: 160-200 gm Total Fluid Estimated Needs: 1 L + UOP  Current Nutrition:  NPO and TPN  Plan:  Advance Clinimix 8/10 at 1800 to 45ml/hr  - 2 L without Lytes  - SMOFlipid 250 mL daily  Will provide 100% AA and 83% of kcal needs.  No electrolytes for now with AKI-CRRT needs. Will re-assess daily.   Continue standard MVI and trace elements to TPN Continue Moderate q4h SSI and adjust as needed   Monitor TPN labs daily while on Clinimix, TGs qMon   Calton Dach, PharmD, BCCCP Clinical Pharmacist 12/29/2022 11:18 AM

## 2022-12-29 NOTE — Progress Notes (Signed)
NAME:  Peter Oneill, MRN:  [id number], DOB:  Oct 02, 1940, LOS: 4 ADMISSION DATE:  12/25/2022, CONSULTATION DATE:  12/29/22 REFERRING MD:  EDP, CHIEF COMPLAINT:  Abdominal Pain   History of Present Illness:  Peter Oneill is a 82 y.o. M with PMH if ESRD, Asbestosis, CAD s/p CABG, HTN, HL, OSA and emphysema followed by Dr. [name] who presented to the ED with acutely worsening abdominal pain that began the day of admission.  He underwent dialysis in the morning and then felt short of breath followed by abdominal discomfort.  His initial oxygen saturation was 70% on EMS arrival and his work-up in the ED included a CT Abd/pelvis concerning for acute mesenteric ischemia secondary to occlusion of the celiac artery and significant stenosis of the superior mesenteric artery and small inferior mesenteric artery.  His labs showed a lactic acid of 1.4, WBC 6.3, creatinine 3.7, BNP >1300, normal Hgb.  He was taken emergently to the OR for urgent revascularization and SMA stenting along with ex-lap.  PCCM asked to admit  Pertinent  Medical History   has a past medical history of Asbestosis (HCC), Contraindication to percutaneous coronary intervention (PCI), Coronary artery disease, Dyslipidemia, Dyspnea, ESRD on hemodialysis (HCC), History of anemia, Hypercholesterolemia, Hypertension, Ileus (HCC), Pleural effusion on left, Pneumonia, and Sleep apnea.   Significant Hospital Events: Including procedures, antibiotic start and stop dates in addition to other pertinent events   10/31 presented with mesenteric ischemia, occlusion of the celiac and significant stenosis of the SMA and small inferior mesenteric artery. Went to OR for urgent revascularization and SMA stenting along with ex-lap. Right hemicolectomy for necrotic cecum. Significant pressor requirement and bleeding (800 cc 1st hour) from abdominal wound vac post-op, given 4L PRBC's, 500cc albumin, 500cc NS and was on Levophed , Neo , Vaso 0.04  and Epi . Pressors were titrated down with resuscitation.  11/1. CVL placed for pressors. LE warm . Weaning pressors. Orthostatic w/ positional change. 2.2 liters positive. Changing prop to dex. Drain output decreased  11/2 back to OR for washout, end ileostomy, and closure 11/4 pressors weaning. SBT   Interim History / Subjective:  No acute events overnight. Weaning on SBT this morning Remains on 14 mcg levophed Art line not working well I/O even On CRRT  Objective   Blood pressure (!) 115/46, pulse 86, temperature 99 F (37.2 C), resp. rate 20, height 5\' 9"  (1.753 m), weight 84.5 kg, SpO2 100%. CVP:  [7 mmHg-13 mmHg] 8 mmHg  Vent Mode: PSV;CPAP FiO2 (%):  [40 %] 40 % Set Rate:  [14 bmp] 14 bmp Vt Set:  [490 mL] 490 mL PEEP:  [5 cmH20] 5 cmH20 Pressure Support:  [15 cmH20] 15 cmH20 Plateau Pressure:  [15 cmH20-22 cmH20] 15 cmH20   Intake/Output Summary (Last 24 hours) at 12/29/2022 0824 Last data filed at 12/29/2022 0800 Gross per 24 hour  Intake 2682.4 ml  Output 2822.8 ml  Net -140.4 ml   Filed Weights   12/27/22 0440 12/28/22 0500 12/29/22 0500  Weight: 89.6 kg 89.2 kg 84.5 kg   Physical exam: General: Older adult male in NAD HEENT: [place]/AT, PERRL, no JVD Neuro: Awake on sedation. Alert, Follows commands bilaterally.  Chest: Clear bilateral breath sounds. Tolerating 15/5 SBT with Vt in the 300s Heart: IRIR, no MRG Abdomen: Soft, NT, ND. Surgical dressing L abdomen. Stoma RLQ with dark green output.  Skin: Grossly intact otherwise.   Labs and images reviewed  Resolved Hospital Problem list     Assessment &  Plan:  Acute mesenteric ischemia with ischemic bowel status post ex lap x 2, resection of large bowel with and ileostomy and angioplasty of SMA. Back to OR 11/2 for ileostomy and closure  - Management per general surgery and vascular surgery - Heparin infusion continue for SMT stent - Dressing changes daily - TPN - OK for enteral meds - JP drain output  , continue to monitor - Zosyn  Shock, mixed septic/vasoplegic/hypovolemic - Pressors weaning. Remains on 14 mcg norepi > off vaso - Art line positional. Will remove if we can't get consistent waveform. Was correlating well with cuff.  - Keeping even on CRRT, will eventually need to go negative once his BP can tolerate.   Acute respiratory failure with hypoxia and hypercapnia Emphysema History of asbestosis - Full vent support - Weaning on 15/5, Vt borderline low. On low dose fentanyl - VAP bundle - Scheduled nebs - SBT daily  Expected postop blood loss anemia and thrombocytopenia - Hemoglobin stable  End-stage renal disease on hemodialysis - CRRT per nephrology - Trend chemistry  Paroxysmal A-fib with slow rate - heparin continues per pharmacy - amiodarone on hold  Moderate malnutrition - TPN as above per pharmacy    Best Practice (right click and "Reselect all SmartList Selections" daily)   Diet/type: NPO - TPN DVT prophylaxis: Systemic heparin GI prophylaxis: H2B Lines: Central line Foley:  Yes, and it is still needed Code Status:  full code Last date of multidisciplinary goals of care discussion: 11/4: Patient's wife was updated at bedside  Critical care time 42 min  Joneen Roach, AGACNP-BC [facility]  See Amion for personal pager PCCM on call pager [phone] until 7pm. Please call Elink 7p-7a. [phone]  12/29/2022 9:25 AM

## 2022-12-29 NOTE — Consult Note (Signed)
WOC Nurse ostomy consult note Stoma type/location: RMQ ileostomy, flush, productive of green liquid stool Stomal assessment/size: 7/8" flush and red, moist Peristomal assessment:  intact  open midline incision.   Treatment options for stomal/peristomal skin: barrier ring and 1 piece soft convex pouch Output liquid green stool Ostomy pouching: 1pc. Convex  with barrier ring  Education provided: Performed pouch change with wife at bedside.  She is a Engineer, civil (consulting) and states she is familiar with ostomy basics and eager to learn ostomy care.  I provide her with written folder. WE discuss the outpatient ostomy clinic.  Twice weekly pouch changes and emptying when 1/3 full Old pouch removed, skin is cleansed  applied barrier ring and explained rationale.  Due to flush stoma, will need convex pouch and barrier ring. Photo in chart.  Enrolled patient in [employer] DC program: Yes will today.  Will follow.  Mike Gip MSN, RN, FNP-BC CWON Wound, Ostomy, Continence Nurse Outpatient [facility] [phone] Pager [phone]

## 2022-12-29 NOTE — Progress Notes (Signed)
Patient ID: Peter Oneill, male   DOB: September 30, 1940, 82 y.o.   MRN: [id number] 2 Days Post-Op    Subjective: On vent and CRRT ROS negative except as listed above. Objective: Vital signs in last 24 hours: Temp:  [97.7 F (36.5 C)-99.1 F (37.3 C)] 99 F (37.2 C) (11/04 0611) Pulse Rate:  [66-96] 80 (11/04 0611) Resp:  [14-29] 20 (11/04 0611) BP: (70-116)/(35-53) 116/47 (11/04 0400) SpO2:  [97 %-100 %] 100 % (11/04 4098) Arterial Line BP: (86-169)/(29-44) 159/37 (11/04 0611) FiO2 (%):  [40 %] 40 % (11/04 0406) Weight:  [84.5 kg] 84.5 kg (11/04 0500) Last BM Date :  (PTA)  Intake/Output from previous day: 11/03 0701 - 11/04 0700 In: 2687.5 [I.V.:1889.9; Blood:293.8; IV Piggyback:200.1] Out: 2920.8 [Emesis/NG output:110; Drains:180; Stool:40] Intake/Output this shift: No intake/output data recorded.  GI: midline looks OK, ileostomy pale pink with some output, no gen tenderness  Lab Results: CBC  Recent Labs    12/28/22 1646 12/29/22 0352  WBC 11.2* 10.9*  HGB 8.7* 8.3*  HCT 26.0* 25.4*  PLT 142* 151   BMET Recent Labs    12/28/22 0513 12/28/22 1225 12/29/22 0352  NA 136 136 133*  K 4.6 4.4 3.8  CL 105  --  102  CO2 23  --  24  GLUCOSE 122*  --  143*  BUN 22  --  20  CREATININE 1.78*  --  1.55*  CALCIUM 8.1*  --  8.0*   PT/INR No results for input(s): "LABPROT", "INR" in the last 72 hours. ABG Recent Labs    12/27/22 1725 12/28/22 1225  PHART 7.262* 7.361  HCO3 25.1 24.4    Studies/Results: No results found.  Anti-infectives: Anti-infectives (From admission, onward)    Start     Dose/Rate Route Frequency Ordered Stop   12/26/22 1800  piperacillin-tazobactam (ZOSYN) IVPB 3.375 g        3.375 g 12.5 mL/hr over 240 Minutes Intravenous Every 8 hours 12/26/22 1615     12/25/22 2200  piperacillin-tazobactam (ZOSYN) IVPB 2.25 g  Status:  Discontinued        2.25 g 100 mL/hr over 30 Minutes Intravenous Every 8 hours 12/25/22 2112 12/26/22 1615        Assessment/Plan: Acute mesenteric ischemia -  -s/p exlap, R hemicolectomy by Dr. Azucena Cecil 11/1 just after midnight. Left in discontinuity with abthera. Also underwent revascularization with angioplasty of SMA by Dr. Lenell Antu.  -S/p washout, end ileostomy, JP drain placement and abdominal closure 11/2 -WBC down to 10.9 -bowel function starting, may be able to start some tube feeds tomorrow  FEN - OK for meds per tube DVT - SCDs, heparin gtt as above Dispo - ICU  I spoke with his wife at the bedside  LOS: 4 days    Violeta Gelinas, MD, MPH, FACS Trauma & General Surgery Use [email or web address] to contact on call provider  12/29/2022

## 2022-12-30 DIAGNOSIS — Z9889 Other specified postprocedural states: Secondary | ICD-10-CM | POA: Diagnosis not present

## 2022-12-30 DIAGNOSIS — K559 Vascular disorder of intestine, unspecified: Secondary | ICD-10-CM

## 2022-12-30 LAB — GLUCOSE, CAPILLARY
Glucose-Capillary: 124 mg/dL — ABNORMAL HIGH (ref 70–99)
Glucose-Capillary: 125 mg/dL — ABNORMAL HIGH (ref 70–99)
Glucose-Capillary: 136 mg/dL — ABNORMAL HIGH (ref 70–99)
Glucose-Capillary: 150 mg/dL — ABNORMAL HIGH (ref 70–99)
Glucose-Capillary: 150 mg/dL — ABNORMAL HIGH (ref 70–99)
Glucose-Capillary: 156 mg/dL — ABNORMAL HIGH (ref 70–99)

## 2022-12-30 LAB — COMPREHENSIVE METABOLIC PANEL
ALT: 33 U/L (ref 0–44)
AST: 42 U/L — ABNORMAL HIGH (ref 15–41)
Albumin: 2.1 g/dL — ABNORMAL LOW (ref 3.5–5.0)
Alkaline Phosphatase: 75 U/L (ref 38–126)
Anion gap: 6 (ref 5–15)
BUN: 25 mg/dL — ABNORMAL HIGH (ref 8–23)
CO2: 25 mmol/L (ref 22–32)
Calcium: 8.2 mg/dL — ABNORMAL LOW (ref 8.9–10.3)
Chloride: 103 mmol/L (ref 98–111)
Creatinine, Ser: 1.49 mg/dL — ABNORMAL HIGH (ref 0.61–1.24)
GFR, Estimated: 47 mL/min — ABNORMAL LOW (ref >60)
Glucose, Bld: 71 mg/dL (ref 70–99)
Potassium: 4 mmol/L (ref 3.5–5.1)
Sodium: 134 mmol/L — ABNORMAL LOW (ref 135–145)
Total Bilirubin: 1.2 mg/dL — ABNORMAL HIGH (ref <1.2)
Total Protein: 5.4 g/dL — ABNORMAL LOW (ref 6.5–8.1)

## 2022-12-30 LAB — MAGNESIUM: Magnesium: 2.4 mg/dL (ref 1.7–2.4)

## 2022-12-30 LAB — SURGICAL PATHOLOGY

## 2022-12-30 LAB — PHOSPHORUS: Phosphorus: 1.3 mg/dL — ABNORMAL LOW (ref 2.5–4.6)

## 2022-12-30 LAB — RENAL FUNCTION PANEL
Albumin: 1.8 g/dL — ABNORMAL LOW (ref 3.5–5.0)
Anion gap: 4 — ABNORMAL LOW (ref 5–15)
BUN: 29 mg/dL — ABNORMAL HIGH (ref 8–23)
CO2: 26 mmol/L (ref 22–32)
Calcium: 7.8 mg/dL — ABNORMAL LOW (ref 8.9–10.3)
Chloride: 101 mmol/L (ref 98–111)
Creatinine, Ser: 1.52 mg/dL — ABNORMAL HIGH (ref 0.61–1.24)
GFR, Estimated: 46 mL/min — ABNORMAL LOW (ref >60)
Glucose, Bld: 151 mg/dL — ABNORMAL HIGH (ref 70–99)
Phosphorus: 2 mg/dL — ABNORMAL LOW (ref 2.5–4.6)
Potassium: 3.6 mmol/L (ref 3.5–5.1)
Sodium: 131 mmol/L — ABNORMAL LOW (ref 135–145)

## 2022-12-30 LAB — CBC
HCT: 26.5 % — ABNORMAL LOW (ref 39.0–52.0)
Hemoglobin: 8.4 g/dL — ABNORMAL LOW (ref 13.0–17.0)
MCH: 30.9 pg (ref 26.0–34.0)
MCHC: 31.7 g/dL (ref 30.0–36.0)
MCV: 97.4 fL (ref 80.0–100.0)
Platelets: 145 10*3/uL — ABNORMAL LOW (ref 150–400)
RBC: 2.72 MIL/uL — ABNORMAL LOW (ref 4.22–5.81)
RDW: 16.3 % — ABNORMAL HIGH (ref 11.5–15.5)
WBC: 8 10*3/uL (ref 4.0–10.5)
nRBC: 3.3 % — ABNORMAL HIGH (ref 0.0–0.2)

## 2022-12-30 LAB — HEPARIN LEVEL (UNFRACTIONATED): Heparin Unfractionated: 0.22 [IU]/mL — ABNORMAL LOW (ref 0.30–0.70)

## 2022-12-30 MED ORDER — ROSUVASTATIN CALCIUM 5 MG PO TABS: 10.0000 mg | ORAL_TABLET | Freq: Every day | ORAL | Status: DC

## 2022-12-30 MED ORDER — DEXTROSE 5 % IV SOLN
15.0000 mmol | Freq: Once | INTRAVENOUS | Status: AC
  Administered 2022-12-30: 15 mmol via INTRAVENOUS
  Filled 2022-12-30: qty 5

## 2022-12-30 MED ORDER — ASPIRIN 81 MG PO CHEW
81.0000 mg | CHEWABLE_TABLET | Freq: Every day | ORAL | Status: DC
  Administered 2022-12-30 – 2023-01-10 (×12): 81 mg
  Filled 2022-12-30 (×12): qty 1

## 2022-12-30 MED ORDER — TRACE MINERALS CU-MN-SE-ZN 300-55-60-3000 MCG/ML IV SOLN
INTRAVENOUS | Status: AC
  Filled 2022-12-30 (×2): qty 2000

## 2022-12-30 MED ORDER — CLOPIDOGREL BISULFATE 75 MG PO TABS
75.0000 mg | ORAL_TABLET | Freq: Every day | ORAL | Status: DC
Start: 2022-12-30 — End: 2022-12-30

## 2022-12-30 MED ORDER — ASPIRIN 81 MG PO CHEW: 81.0000 mg | CHEWABLE_TABLET | Freq: Every day | ORAL | Status: DC

## 2022-12-30 MED ORDER — CLOPIDOGREL BISULFATE 75 MG PO TABS
75.0000 mg | ORAL_TABLET | Freq: Every day | ORAL | Status: DC
  Administered 2022-12-30 – 2023-01-09 (×11): 75 mg
  Filled 2022-12-30 (×11): qty 1

## 2022-12-30 MED ORDER — SODIUM PHOSPHATES 45 MMOLE/15ML IV SOLN
15.0000 mmol | Freq: Once | INTRAVENOUS | Status: AC
  Administered 2022-12-30: 15 mmol via INTRAVENOUS
  Filled 2022-12-30: qty 5

## 2022-12-30 MED ORDER — FAT EMUL FISH OIL/PLANT BASED 20% (SMOFLIPID)IV EMUL
250.0000 mL | INTRAVENOUS | Status: AC
  Administered 2022-12-30: 250 mL via INTRAVENOUS
  Filled 2022-12-30: qty 250

## 2022-12-30 MED ORDER — SODIUM PHOSPHATES 45 MMOLE/15ML IV SOLN
45.0000 mmol | Freq: Once | INTRAVENOUS | Status: DC
  Filled 2022-12-30: qty 15

## 2022-12-30 MED ORDER — ROSUVASTATIN CALCIUM 20 MG PO TABS
10.0000 mg | ORAL_TABLET | Freq: Every day | ORAL | Status: DC
  Administered 2022-12-30 – 2023-01-10 (×10): 10 mg
  Filled 2022-12-30 (×2): qty 1
  Filled 2022-12-30 (×3): qty 2
  Filled 2022-12-30 (×3): qty 1
  Filled 2022-12-30: qty 2
  Filled 2022-12-30 (×2): qty 1

## 2022-12-30 NOTE — Progress Notes (Signed)
Patient ID: Peter Oneill, male   DOB: [birth year], 82 y.o.   MRN: [id number] 3 Days Post-Op    Subjective: Remains on levophed Vasopressin added overnight On vent and CRRT  ROS negative except as listed above. Objective: Vital signs in last 24 hours: Temp:  [98.5 F (36.9 C)-99.6 F (37.6 C)] 99.6 F (37.6 C) (11/05 0808) Pulse Rate:  [63-103] 71 (11/05 0801) Resp:  [14-36] 22 (11/05 0801) BP: (90-171)/(30-67) 142/46 (11/05 0801) SpO2:  [100 %] 100 % (11/05 0804) Arterial Line BP: (71-79)/(44-68) 71/67 (11/04 0845) FiO2 (%):  [40 %] 40 % (11/05 0804) Weight:  [83.7 kg] 83.7 kg (11/05 0500) Last BM Date : 12/29/22  Intake/Output from previous day: 11/04 0701 - 11/05 0700 In: 3393.7 [I.V.:2611.4; NG/GT:90; IV Piggyback:692.3] Out: 3232.3 [Emesis/NG output:100; Drains:54; Stool:50] Intake/Output this shift: Total I/O In: 269.2 [I.V.:261.9; IV Piggyback:7.2] Out: -   GI: soft, wound OK, ileostomy with a small amount of output  Lab Results: CBC  Recent Labs    12/29/22 0352 12/30/22 0545  WBC 10.9* 8.0  HGB 8.3* 8.4*  HCT 25.4* 26.5*  PLT 151 145*   BMET Recent Labs    12/29/22 0352 12/30/22 0545  NA 133* 134*  K 3.8 4.0  CL 102 103  CO2 24 25  GLUCOSE 143* 71  BUN 20 25*  CREATININE 1.55* 1.49*  CALCIUM 8.0* 8.2*   PT/INR No results for input(s): "LABPROT", "INR" in the last 72 hours. ABG Recent Labs    12/27/22 1725 12/28/22 1225  PHART 7.262* 7.361  HCO3 25.1 24.4    Studies/Results: No results found.  Anti-infectives: Anti-infectives (From admission, onward)    Start     Dose/Rate Route Frequency Ordered Stop   12/26/22 1800  piperacillin-tazobactam (ZOSYN) IVPB 3.375 g        3.375 g 12.5 mL/hr over 240 Minutes Intravenous Every 8 hours 12/26/22 1615     12/25/22 2200  piperacillin-tazobactam (ZOSYN) IVPB 2.25 g  Status:  Discontinued        2.25 g 100 mL/hr over 30 Minutes Intravenous Every 8 hours 12/25/22 2112 12/26/22 1615        Assessment/Plan: Acute mesenteric ischemia -  -s/p exlap, R hemicolectomy by Dr. Azucena Cecil 11/1 just after midnight. Left in discontinuity with abthera. Also underwent revascularization with angioplasty of SMA by Dr. Lenell Antu.  -S/p washout, end ileostomy, JP drain placement and abdominal closure 11/2 -WBC down to 8 -bowel function starting but still with some NGT output, may be able to start some tube feeds tomorrow  FEN - OK for meds per tube, continue TNA DVT - SCDs, heparin gtt as above Dispo - ICU   LOS: 5 days    Violeta Gelinas, MD, MPH, FACS Trauma & General Surgery Use [email or web address] to contact on call provider  12/30/2022

## 2022-12-30 NOTE — Progress Notes (Signed)
NAME:  Peter Oneill, MRN:  [id number], DOB:  1941/02/18, LOS: 5 ADMISSION DATE:  12/25/2022, CONSULTATION DATE:  12/25/2022 REFERRING MD:  EDP, CHIEF COMPLAINT:  Abdominal Pain   History of Present Illness:  82 year old man with PMHx significant for ESRD, Asbestosis, CAD s/p CABG, HTN, HL, OSA and emphysema followed by Dr. [name] who presented to the ED with acutely worsening abdominal pain that began the day of admission.  He underwent dialysis in the morning and then felt short of breath followed by abdominal discomfort.  His initial oxygen saturation was 70% on EMS arrival and his work-up in the ED included a CT Abd/pelvis concerning for acute mesenteric ischemia secondary to occlusion of the celiac artery and significant stenosis of the superior mesenteric artery and small inferior mesenteric artery.  His labs showed a lactic acid of 1.4, WBC 6.3, creatinine 3.7, BNP >1300, normal Hgb.  He was taken emergently to the OR for urgent revascularization and SMA stenting along with ex-lap.  PCCM asked to admit  Pertinent Medical History:   Past Medical History:  Diagnosis Date   Asbestosis (HCC)    Contraindication to percutaneous coronary intervention (PCI)    Coronary artery disease    Dyslipidemia    Dyspnea    ESRD on hemodialysis (HCC)    History of anemia    Hypercholesterolemia    Hypertension    Ileus (HCC)    Pleural effusion on left    Pneumonia    Sleep apnea    Significant Hospital Events: Including procedures, antibiotic start and stop dates in addition to other pertinent events   10/31 Presented with mesenteric ischemia, occlusion of the celiac and significant stenosis of the SMA and small inferior mesenteric artery. Went to OR for urgent revascularization and SMA stenting along with ex-lap. Right hemicolectomy for necrotic cecum. Significant pressor requirement and bleeding (800 cc 1st hour) from abdominal wound vac post-op, given 4L PRBC's, 500cc albumin, 500cc NS and  was on Levophed , Neo , Vaso 0.04 and Epi . Pressors were titrated down with resuscitation.  11/1. CVL placed for pressors. LE warm . Weaning pressors. Orthostatic w/ positional change. 2.2 liters positive. Changing prop to dex. Drain output decreased  11/2 Back to OR for washout, end ileostomy, and closure 11/4 Pressors weaning. SBT   Interim History / Subjective:  No significant events overnight Vasopressin added to help with NE weaning Pressor requirements downtrending On SBT (PSV 18/5, tolerating well) Weaned yesterday but ultimately failed due to fatigue Remains on CRRT net even Wakes up/follows commands  Objective:  Blood pressure (!) 136/47, pulse 67, temperature 99.2 F (37.3 C), temperature source Oral, resp. rate 17, height 5\' 9"  (1.753 m), weight 83.7 kg, SpO2 100%. CVP:  [8 mmHg-28 mmHg] 9 mmHg  Vent Mode: PRVC FiO2 (%):  [40 %] 40 % Set Rate:  [14 bmp] 14 bmp Vt Set:  [490 mL] 490 mL PEEP:  [5 cmH20] 5 cmH20 Pressure Support:  [15 cmH20] 15 cmH20 Plateau Pressure:  [15 cmH20-23 cmH20] 20 cmH20   Intake/Output Summary (Last 24 hours) at 12/30/2022 0756 Last data filed at 12/30/2022 0700 Gross per 24 hour  Intake 3393.73 ml  Output 3232.3 ml  Net 161.43 ml   Filed Weights   12/28/22 0500 12/29/22 0500 12/30/22 0500  Weight: 89.2 kg 84.5 kg 83.7 kg   Physical Examination: General: Acutely ill-appearing elderly man in NAD. HEENT: [place]/AT, anicteric sclera, PERRL 3mm, dry mucous membranes. Neuro: Awake, unable to assess orientation due to ETT.  Responds to verbal stimuli. Following commands consistently. Moves all 4 extremities spontaneously. +Cough and +Gag  CV: Irregularly irregular rhythm, no m/g/r. PULM: Breathing even and unlabored on vent (PSV 18/5). Lung fields CTAB in upper fields, diminished at bilateral bases. GI: Soft, nontender, nondistended. Hypoactive bowel sounds. Ileostomy in place with scant dark brown-green liquid output. Midline surgical  incision with dressing in place, c/d/i. Extremities: Trace BLE edema noted. Small ulceration of L great toe noted. Skin: Warm/dry, no rashes.  Resolved Hospital Problem List:    Assessment & Plan:  Acute mesenteric ischemia with ischemic bowel status post ex lap x 2, resection of large bowel with and ileostomy and angioplasty of SMA. Back to OR 11/2 for ileostomy and closure  - Postoperative management per CCS/VVS - Heparin infusion for SMA stent - Dressing changes per protocol - TPN, ok for enteral medications - WOC for ileostomy - Zosyn for broad-spectrum antibiotic coverage - Ok for ASA/Plavix, statin initiation per VVS  Shock, mixed septic/vasoplegic/hypovolemic - Goal MAP > 65 - Levophed titrated to goal MAP + vaso - Goal net even on CRRT until BP can tolerate UF - F/u Cx data - Continue broad-spectrum antibiotics  Acute respiratory failure with hypoxia and hypercapnia Emphysema History of asbestosis - Continue full vent support (4-8cc/kg IBW) - Wean FiO2 for O2 sat > 90% - Daily WUA/SBT, tolerated well 11/4 but fatigued; weaning well thus far today 11/5 - VAP bundle - Bronchodilators as indicated - Pulmonary hygiene - PAD protocol for sedation: Fentanyl for goal RASS 0 to -1  Expected postop blood loss anemia and thrombocytopenia - Trend H&H - Monitor for signs of active bleeding - Transfuse for Hgb < 7.0 or hemodynamically significant bleeding  End-stage renal disease on hemodialysis Hypophosphatemia - Nephro following, appreciate recs - CRRT per Nephro, goal even for now due to hypotension - Trend BMP - Replete electrolytes as indicated - Monitor I&Os - Avoid nephrotoxic agents as able - Ensure adequate renal perfusion  Paroxysmal A-fib with slow rate - Cardiac monitoring - Optimize electrolytes for K > 4, Mg > 2 - Heparin gtt for AC - Hold amiodarone  Moderate malnutrition - TPN per Pharmacy  Best Practice: (right click and "Reselect all SmartList  Selections" daily)   Diet/type: NPO - TPN DVT prophylaxis: Systemic heparin GI prophylaxis: H2B Lines: Central line Foley:  Yes, and it is still needed Code Status:  full code Last date of multidisciplinary goals of care discussion: 11/4: Patient's wife was updated at bedside  Critical care time:   The patient is critically ill with multiple organ system failure and requires high complexity decision making for assessment and support, frequent evaluation and titration of therapies, advanced monitoring, review of radiographic studies and interpretation of complex data.   Critical Care Time devoted to patient care services, exclusive of separately billable procedures, described in this note is 37 minutes.  Tim Lair, PA-C [facility] 12/30/22 7:56 AM  Please see [email or web address] for pager details.  From 7A-7P if no response, please call [phone] After hours, please call ELink [phone]

## 2022-12-30 NOTE — Progress Notes (Signed)
Patient ID: Peter Oneill, male   DOB: 20-Sep-1940, 82 y.o.   MRN: [id number] [facility] Progress Note   Assessment/ Plan:   1.  Acute mesenteric ischemia with ischemic gut injury: Status post exploratory laparotomy with resection of large bowel and ileostomy as well as angioplasty of SMA.  Ongoing management per surgery and vascular surgery-remains on heparin infusion for SMA stent along with TPN for parenteral nutrition. 2. ESRD: Transitioned to CRRT on 11/1 in the setting of hemodynamic instability/critical illness and large volume requirements.  Previously on TTS HD as an outpatient. 3. Anemia: Fluctuating hemoglobin/hematocrit postoperatively, will monitor trend and continue ESA. 4. CKD-MBD: Low phosphorus level secondary to ongoing CRRT losses/limited intake.  Would recommend addition of phosphorus to TPN to limit need for intravenous piggyback dosing. 5. Nutrition: On TPN. 6. Hypotension: Pressors being weaned off as he continues to tolerate net even fluid balance on CRRT.  Subjective:   Started on vasopressin overnight with decreased Levophed requirements.   Objective:   BP (!) 142/46   Pulse 71   Temp 99.6 F (37.6 C) (Axillary)   Resp (!) 22   Ht 5\' 9"  (1.753 m)   Wt 83.7 kg   SpO2 100%   BMI 27.25 kg/m   Physical Exam: Gen: Intubated, sedated, appears comfortable CVS: Pulse regular rhythm, normal rate, S1 and S2 normal Resp: Anteriorly clear to auscultation, no distinct rales or rhonchi Abd: Status post exploratory laparotomy with RLQ ostomy stoma.  Surgical dressing intact Ext: Trace ankle edema.  Left femoral dialysis catheter due to CRRT.  Left upper arm AV fistula with palpable thrill  Labs: BMET Recent Labs  Lab 12/26/22 0229 12/26/22 0232 12/26/22 1050 12/26/22 1529 12/26/22 1606 12/27/22 0327 12/27/22 1617 12/27/22 1725 12/28/22 0513 12/28/22 1225 12/29/22 0352 12/30/22 0545  NA 137   < > 135 139   < > 135 137 135 136 136 133* 134*   K 4.3   < > 4.8 5.0   < > 5.0 5.4* 5.4* 4.6 4.4 3.8 4.0  CL 107   < > 105 107  --  105 105  --  105  --  102 103  CO2 21*   < > 18* 20*  --  22 22  --  23  --  24 25  GLUCOSE 166*   < > 189* 156*  --  134* 148*  --  122*  --  143* 71  BUN 30*   < > 34* 38*  --  27* 29*  --  22  --  20 25*  CREATININE 3.72*   < > 4.18* 4.18*  --  2.58* 2.24*  --  1.78*  --  1.55* 1.49*  CALCIUM 9.2   < > 8.2* 8.5*  --  8.1* 8.1*  --  8.1*  --  8.0* 8.2*  PHOS 4.5  --   --  5.1*  --  4.3 5.0*  --  4.1  --  2.8 1.3*   < > = values in this interval not displayed.   CBC Recent Labs  Lab 12/26/22 2001 12/26/22 2007 12/28/22 0513 12/28/22 1225 12/28/22 1646 12/29/22 0352 12/30/22 0545  WBC 12.7*   < > 12.7*  --  11.2* 10.9* 8.0  NEUTROABS 10.7*  --   --   --  9.6*  --   --   HGB 9.6*   < > 7.2* 7.8* 8.7* 8.3* 8.4*  HCT 26.7*   < > 21.8* 23.0* 26.0* 25.4* 26.5*  MCV 87.8   < > 92.8  --  93.2 94.1 97.4  PLT 117*   < > 155  --  142* 151 145*   < > = values in this interval not displayed.      Medications:     arformoterol  15 mcg Nebulization BID   Chlorhexidine Gluconate Cloth  6 each Topical Daily   darbepoetin (ARANESP) injection - DIALYSIS  40 mcg Subcutaneous Q Mon-1800   doxercalciferol  4 mcg Intravenous Q M,W,F   insulin aspart  0-15 Units Subcutaneous Q4H   latanoprost  1 drop Both Eyes QHS   mouth rinse  15 mL Mouth Rinse Q2H   revefenacin  175 mcg Nebulization Daily   sodium chloride flush  10-40 mL Intracatheter Q12H   Zetta Bills, MD 12/30/2022, 8:43 AM

## 2022-12-30 NOTE — Progress Notes (Addendum)
eLink Physician-Brief Progress Note Patient Name: Peter Oneill DOB: 11-12-1940 MRN: [id number]   Date of Service  12/30/2022  HPI/Events of Note  82 yo M admitted for acute mesenteric ischemia s/p ex lap resection, shock, renal failure, on pressors and life support   Currently on norepinephrine at 20 mcg.  eICU Interventions  Maintain norepinephrine to MAP greater than 68.  Reinitiate vasopressin   0610 - Added desired RASS goal.   Intervention Category Major Interventions: Shock - evaluation and management  Soniya Ashraf 12/30/2022, 5:21 AM

## 2022-12-30 NOTE — Progress Notes (Signed)
PHARMACY - ANTICOAGULATION CONSULT NOTE  Pharmacy Consult for Heparin  Indication: Mesenteric ischemia s/p angioplasty/stenting of SMA  Allergies  Allergen Reactions   Atorvastatin Other (See Comments)    Muscle aches    Patient Measurements: Height: 5\' 9"  (175.3 cm) Weight: 83.7 kg (184 lb 8.4 oz) IBW/kg (Calculated) : 70.7  Vital Signs: Temp: 99.6 F (37.6 C) (11/05 0808) Temp Source: Axillary (11/05 0808) BP: 147/48 (11/05 1100) Pulse Rate: 72 (11/05 1100)  Labs: Recent Labs    12/28/22 0513 12/28/22 0744 12/28/22 1646 12/28/22 1833 12/29/22 0352 12/29/22 0741 12/30/22 0545  HGB 7.2*   < > 8.7*  --  8.3*  --  8.4*  HCT 21.8*   < > 26.0*  --  25.4*  --  26.5*  PLT 155  --  142*  --  151  --  145*  HEPARINUNFRC  --    < >  --  0.29*  --  0.24* 0.22*  CREATININE 1.78*  --   --   --  1.55*  --  1.49*   < > = values in this interval not displayed.    Estimated Creatinine Clearance: 38.9 mL/min (A) (by C-G formula based on SCr of 1.49 mg/dL (H)).   Medical History: Past Medical History:  Diagnosis Date   Asbestosis (HCC)    Contraindication to percutaneous coronary intervention (PCI)    Coronary artery disease    Dyslipidemia    Dyspnea    ESRD on hemodialysis (HCC)    History of anemia    Hypercholesterolemia    Hypertension    Ileus (HCC)    Pleural effusion on left    Pneumonia    Sleep apnea     Assessment: 82 y/o M with mesenteric ischemia s/p OR with vascular and general surgery. S/P angioplasty and stenting of the SMA and right hemi-colectomy.  Heparin level 0.22 is within established low goal range.  CBC stable (Hgb 8.4, pltc 145).  No issues with infusion or bleeding per RN.   Goal of Therapy:  Heparin level 0.2-0.5 units/ml Monitor platelets by anticoagulation protocol: Yes   Plan:   Continue IV heparin at 1100 units/hr Monitor daily heparin level, CBC, signs/symptoms of bleeding   Trixie Rude, PharmD Clinical Pharmacist 12/30/2022   11:42 AM

## 2022-12-30 NOTE — Plan of Care (Signed)
  Problem: Clinical Measurements: Goal: Respiratory complications will improve Outcome: Progressing Goal: Cardiovascular complication will be avoided Outcome: Progressing   Problem: Pain Management: Goal: General experience of comfort will improve Outcome: Progressing

## 2022-12-30 NOTE — Progress Notes (Addendum)
PHARMACY - TOTAL PARENTERAL NUTRITION CONSULT NOTE   Indication:  bowel discontinuity  Patient Measurements: Height: 5\' 9"  (175.3 cm) Weight: 83.7 kg (184 lb 8.4 oz) IBW/kg (Calculated) : 70.7 TPN AdjBW (KG): 81.2 Body mass index is 27.25 kg/m.  Assessment: 82 years of age male with moderate malnutrition at baseline due to chronic illness including ESRD on HD who presented with acute mesenteric ischemia s/p ex lap with R hemicolectomy, angioplasty and stenting of SMA, wound VAC placement, and bowel left in discontinuity on 11/1. Pharmacy consulted for TPN.   Glucose / Insulin: CBGs <180. 6 units mSSI utilized last 24 hours. Note given Decadron 10mg  on 10/31 and 8 mg on 11/2 - no further orders.  Electrolytes: Na 134, K 4, Phos 1.3, Mg 2.4, coCa 9.7. Others within normal limits.  Renal: ESRD on HD TTS outpatient, last HD 10/31, now requiring CRRT 11/1 >>  Hepatic: AST down 42. ALT/AlkPhos WNL. Tbili up 1.2. Albumin 2.1. TG 119 (11/4) Intake / Output; MIVF: 0 UOP. CRRT removal 3028 mL. NGT output 100 mL. JP driain output down 54 mL. Ileostomy output 50 mL. Net +5.8L.  GI Imaging: None since start of TPN GI Surgeries / Procedures:  None since start of TPN  Central access: CVC (double lumen) placed 11/1 - RN to dedicate lumen for TPN. TPN start date: 12/28/22  Nutritional Goals: Clinimix 8/10 2 L without Lytes x 7 days @ 82 mL/hr - SMOFlipid 250 mL daily Will provide 100% AA + 83% kcal needs Monitor and consider if should be exception for compounded TPN to help with volume.   RD Assessment: Estimated Needs Total Energy Estimated Needs: 2400-2600 Total Protein Estimated Needs: 160-200 g Total Fluid Estimated Needs: 1 L + UOP  Current Nutrition:  NPO and TPN  Plan:  Continue Clinimix 8/10 at 1800 at 69ml/hr  - 2 L without Lytes  - SMOFlipid 250 mL daily  Will provide 100% AA and 83% of kcal needs.  No electrolytes for now with AKI-CRRT needs. Will re-assess daily.    Continue adding standard MVI and trace elements to TPN Give NaPhos IV x1 in addition to TPN Continue Moderate q4h SSI and adjust as needed  Recheck phosphorous level at 2000 and replete prn Monitor TPN labs daily while on Clinimix, TGs qMon  F/u ability to initiate TF, per general surgery guidance   Wilburn Cornelia, PharmD, BCPS Clinical Pharmacist 12/30/2022 7:51 AM   Please refer to [facility] for pharmacy phone number

## 2022-12-30 NOTE — Progress Notes (Signed)
  Progress Note    12/30/2022 7:30 AM 3 Days Post-Op  Subjective:  intubated   Vitals:   12/30/22 0645 12/30/22 0700  BP: (!) 171/53 (!) 159/67  Pulse: 69 79  Resp: (!) 26 (!) 29  Temp:    SpO2: 100% 100%    Physical Exam: General:  intubated and sedated Cardiac:  regular Lungs:  on vent Incisions:  midline abdominal incision with WTD dressing Extremities:  DP doppler signals Abdomen:  soft, nondistended, ostomy clean  CBC    Component Value Date/Time   WBC 8.0 12/30/2022 0545   RBC 2.72 (L) 12/30/2022 0545   HGB 8.4 (L) 12/30/2022 0545   HGB 11.4 (L) 07/12/2021 1057   HCT 26.5 (L) 12/30/2022 0545   HCT 33.5 (L) 07/12/2021 1057   PLT 145 (L) 12/30/2022 0545   PLT 212 07/12/2021 1057   MCV 97.4 12/30/2022 0545   MCV 90 07/12/2021 1057   MCH 30.9 12/30/2022 0545   MCHC 31.7 12/30/2022 0545   RDW 16.3 (H) 12/30/2022 0545   RDW 15.8 (H) 07/12/2021 1057   LYMPHSABS 0.5 (L) 12/28/2022 1646   MONOABS 1.0 12/28/2022 1646   EOSABS 0.0 12/28/2022 1646   BASOSABS 0.0 12/28/2022 1646    BMET    Component Value Date/Time   NA 133 (L) 12/29/2022 0352   K 3.8 12/29/2022 0352   CL 102 12/29/2022 0352   CO2 24 12/29/2022 0352   GLUCOSE 143 (H) 12/29/2022 0352   BUN 20 12/29/2022 0352   CREATININE 1.55 (H) 12/29/2022 0352   CALCIUM 8.0 (L) 12/29/2022 0352   CALCIUM 8.2 (L) 05/15/2021 0750   GFRNONAA 45 (L) 12/29/2022 0352    INR    Component Value Date/Time   INR 1.6 (H) 12/26/2022 0300     Intake/Output Summary (Last 24 hours) at 12/30/2022 0730 Last data filed at 12/30/2022 0700 Gross per 24 hour  Intake 3393.73 ml  Output 3232.3 ml  Net 161.43 ml      Assessment/Plan:  82 y.o. male is s/p: SMA stenting, exploratory laparotomy, and ileostomy    -Intubated and on vent. Slowly weaning as tolerated. Had to resume vaso overnight  -Abdomen is soft and nondistended. Midline incision with WTD dressing, care per general surgery -BLE with DP doppler  signals -On heparin gtt. Okay for ASA/plavix and statin once cleared with primary   Loel Dubonnet, PA-C [facility] [phone] 12/30/2022 7:30 AM

## 2022-12-31 ENCOUNTER — Ambulatory Visit: Payer: Medicare HMO | Admitting: Physician Assistant

## 2022-12-31 DIAGNOSIS — N186 End stage renal disease: Secondary | ICD-10-CM

## 2022-12-31 DIAGNOSIS — R6521 Severe sepsis with septic shock: Secondary | ICD-10-CM

## 2022-12-31 DIAGNOSIS — Z992 Dependence on renal dialysis: Secondary | ICD-10-CM

## 2022-12-31 DIAGNOSIS — J9601 Acute respiratory failure with hypoxia: Secondary | ICD-10-CM

## 2022-12-31 DIAGNOSIS — A419 Sepsis, unspecified organism: Secondary | ICD-10-CM | POA: Diagnosis not present

## 2022-12-31 DIAGNOSIS — J9602 Acute respiratory failure with hypercapnia: Secondary | ICD-10-CM

## 2022-12-31 DIAGNOSIS — K559 Vascular disorder of intestine, unspecified: Secondary | ICD-10-CM | POA: Diagnosis not present

## 2022-12-31 LAB — CBC
HCT: 24.5 % — ABNORMAL LOW (ref 39.0–52.0)
HCT: 23.3 % — ABNORMAL LOW (ref 39.0–52.0)
Hemoglobin: 7.8 g/dL — ABNORMAL LOW (ref 13.0–17.0)
Hemoglobin: 7.3 g/dL — ABNORMAL LOW (ref 13.0–17.0)
MCH: 30.8 pg (ref 26.0–34.0)
MCH: 30.4 pg (ref 26.0–34.0)
MCHC: 31.8 g/dL (ref 30.0–36.0)
MCHC: 31.3 g/dL (ref 30.0–36.0)
MCV: 96.8 fL (ref 80.0–100.0)
MCV: 97.1 fL (ref 80.0–100.0)
Platelets: 154 10*3/uL (ref 150–400)
Platelets: 149 10*3/uL — ABNORMAL LOW (ref 150–400)
RBC: 2.53 MIL/uL — ABNORMAL LOW (ref 4.22–5.81)
RBC: 2.4 MIL/uL — ABNORMAL LOW (ref 4.22–5.81)
RDW: 15.9 % — ABNORMAL HIGH (ref 11.5–15.5)
RDW: 16.1 % — ABNORMAL HIGH (ref 11.5–15.5)
WBC: 7 10*3/uL (ref 4.0–10.5)
WBC: 5.6 10*3/uL (ref 4.0–10.5)
nRBC: 1.6 % — ABNORMAL HIGH (ref 0.0–0.2)
nRBC: 2 % — ABNORMAL HIGH (ref 0.0–0.2)

## 2022-12-31 LAB — TYPE AND SCREEN
ABO/RH(D): O POS
Antibody Screen: NEGATIVE

## 2022-12-31 LAB — HEPARIN LEVEL (UNFRACTIONATED): Heparin Unfractionated: 0.21 [IU]/mL — ABNORMAL LOW (ref 0.30–0.70)

## 2022-12-31 LAB — RENAL FUNCTION PANEL
Albumin: 2 g/dL — ABNORMAL LOW (ref 3.5–5.0)
Anion gap: 6 (ref 5–15)
BUN: 32 mg/dL — ABNORMAL HIGH (ref 8–23)
CO2: 25 mmol/L (ref 22–32)
Calcium: 8.1 mg/dL — ABNORMAL LOW (ref 8.9–10.3)
Chloride: 102 mmol/L (ref 98–111)
Creatinine, Ser: 1.26 mg/dL — ABNORMAL HIGH (ref 0.61–1.24)
GFR, Estimated: 57 mL/min — ABNORMAL LOW (ref >60)
Glucose, Bld: 134 mg/dL — ABNORMAL HIGH (ref 70–99)
Phosphorus: 3.1 mg/dL (ref 2.5–4.6)
Potassium: 4.1 mmol/L (ref 3.5–5.1)
Sodium: 133 mmol/L — ABNORMAL LOW (ref 135–145)

## 2022-12-31 LAB — COMPREHENSIVE METABOLIC PANEL
ALT: 25 U/L (ref 0–44)
AST: 30 U/L (ref 15–41)
Albumin: 2 g/dL — ABNORMAL LOW (ref 3.5–5.0)
Alkaline Phosphatase: 83 U/L (ref 38–126)
Anion gap: 6 (ref 5–15)
BUN: 32 mg/dL — ABNORMAL HIGH (ref 8–23)
CO2: 25 mmol/L (ref 22–32)
Calcium: 8 mg/dL — ABNORMAL LOW (ref 8.9–10.3)
Chloride: 100 mmol/L (ref 98–111)
Creatinine, Ser: 1.4 mg/dL — ABNORMAL HIGH (ref 0.61–1.24)
GFR, Estimated: 50 mL/min — ABNORMAL LOW (ref >60)
Glucose, Bld: 162 mg/dL — ABNORMAL HIGH (ref 70–99)
Potassium: 3.6 mmol/L (ref 3.5–5.1)
Sodium: 131 mmol/L — ABNORMAL LOW (ref 135–145)
Total Bilirubin: 1.3 mg/dL — ABNORMAL HIGH (ref <1.2)
Total Protein: 5.5 g/dL — ABNORMAL LOW (ref 6.5–8.1)

## 2022-12-31 LAB — GLUCOSE, CAPILLARY
Glucose-Capillary: 125 mg/dL — ABNORMAL HIGH (ref 70–99)
Glucose-Capillary: 130 mg/dL — ABNORMAL HIGH (ref 70–99)
Glucose-Capillary: 131 mg/dL — ABNORMAL HIGH (ref 70–99)
Glucose-Capillary: 134 mg/dL — ABNORMAL HIGH (ref 70–99)
Glucose-Capillary: 135 mg/dL — ABNORMAL HIGH (ref 70–99)
Glucose-Capillary: 143 mg/dL — ABNORMAL HIGH (ref 70–99)

## 2022-12-31 LAB — MAGNESIUM: Magnesium: 2.2 mg/dL (ref 1.7–2.4)

## 2022-12-31 LAB — PHOSPHORUS: Phosphorus: 1.8 mg/dL — ABNORMAL LOW (ref 2.5–4.6)

## 2022-12-31 MED ORDER — FAT EMUL FISH OIL/PLANT BASED 20% (SMOFLIPID)IV EMUL
250.0000 mL | INTRAVENOUS | Status: AC
  Administered 2022-12-31: 250 mL via INTRAVENOUS
  Filled 2022-12-31: qty 250

## 2022-12-31 MED ORDER — TRACE MINERALS CU-MN-SE-ZN 300-55-60-3000 MCG/ML IV SOLN
INTRAVENOUS | Status: AC
  Filled 2022-12-31 (×2): qty 2000

## 2022-12-31 MED ORDER — NEPRO/CARBSTEADY PO LIQD
1000.0000 mL | ORAL | Status: DC
  Administered 2022-12-31: 1000 mL
  Filled 2022-12-31: qty 1000

## 2022-12-31 MED ORDER — SODIUM CHLORIDE 0.9 % IV SOLN: INTRAVENOUS | Status: AC | PRN

## 2022-12-31 MED ORDER — NEPRO/CARBSTEADY PO LIQD: 237.0000 mL | ORAL | Status: DC

## 2022-12-31 MED ORDER — VITAL 1.5 CAL PO LIQD
1000.0000 mL | ORAL | Status: DC
  Administered 2022-12-31: 1000 mL

## 2022-12-31 MED ORDER — POTASSIUM PHOSPHATES 15 MMOLE/5ML IV SOLN
30.0000 mmol | Freq: Once | INTRAVENOUS | Status: AC
  Administered 2022-12-31: 30 mmol via INTRAVENOUS
  Filled 2022-12-31: qty 10

## 2022-12-31 NOTE — Progress Notes (Signed)
Patient ID: Peter Oneill, male   DOB: [birth year], 82 y.o.   MRN: [id number] [facility] Progress Note   Assessment/ Plan:   1.  Acute mesenteric ischemia with ischemic gut injury: Status post exploratory laparotomy with resection of large bowel and ileostomy as well as angioplasty of SMA.  Ongoing management per surgery and vascular surgery-remains on heparin infusion for SMA stent along with TPN for parenteral nutrition. 2. ESRD: On CRRT since 11/1 in the setting of hemodynamic instability/critical illness and large volume requirements.  Previously on TTS HD as an outpatient. 3. Anemia: Fluctuating hemoglobin/hematocrit postoperatively, will monitor trend and continue ESA. 4. CKD-MBD: Low phosphorus level secondary to ongoing CRRT losses/limited intake.  Ongoing IV replacement. 5. Nutrition: On TPN. 6. Hypotension: Pressors being weaned off as he continues to tolerate net even fluid balance on CRRT.  Subjective:   No acute events overnights, weaning pressors   Objective:   BP (!) 122/36   Pulse 62   Temp 98.1 F (36.7 C) (Axillary)   Resp 18   Ht 5\' 9"  (1.753 m)   Wt 83.2 kg   SpO2 100%   BMI 27.09 kg/m   Physical Exam: Gen: Intubated, follows commands/awakens to voice, appears comfortable CVS: Pulse regular rhythm, normal rate, S1 and S2 normal Resp: Anteriorly clear to auscultation, no distinct rales or rhonchi Abd: Status post exploratory laparotomy with RLQ ostomy stoma.  Surgical dressing intact Ext: Trace ankle edema.  Left femoral dialysis catheter connected to CRRT.  Left upper arm AV fistula with palpable thrill  Labs: BMET Recent Labs  Lab 12/27/22 0327 12/27/22 1617 12/27/22 1725 12/28/22 0513 12/28/22 1225 12/29/22 0352 12/30/22 0545 12/30/22 1520 12/31/22 0342  NA 135 137 135 136 136 133* 134* 131* 131*  K 5.0 5.4* 5.4* 4.6 4.4 3.8 4.0 3.6 3.6  CL 105 105  --  105  --  102 103 101 100  CO2 22 22  --  23  --  24 25 26 25   GLUCOSE 134*  148*  --  122*  --  143* 71 151* 162*  BUN 27* 29*  --  22  --  20 25* 29* 32*  CREATININE 2.58* 2.24*  --  1.78*  --  1.55* 1.49* 1.52* 1.40*  CALCIUM 8.1* 8.1*  --  8.1*  --  8.0* 8.2* 7.8* 8.0*  PHOS 4.3 5.0*  --  4.1  --  2.8 1.3* 2.0* 1.8*   CBC Recent Labs  Lab 12/26/22 2001 12/26/22 2007 12/28/22 1646 12/29/22 0352 12/30/22 0545 12/31/22 0342  WBC 12.7*   < > 11.2* 10.9* 8.0 7.0  NEUTROABS 10.7*  --  9.6*  --   --   --   HGB 9.6*   < > 8.7* 8.3* 8.4* 7.8*  HCT 26.7*   < > 26.0* 25.4* 26.5* 24.5*  MCV 87.8   < > 93.2 94.1 97.4 96.8  PLT 117*   < > 142* 151 145* 154   < > = values in this interval not displayed.      Medications:     arformoterol  15 mcg Nebulization BID   aspirin  81 mg Per Tube Daily   Chlorhexidine Gluconate Cloth  6 each Topical Daily   clopidogrel  75 mg Per Tube Daily   darbepoetin (ARANESP) injection - DIALYSIS  40 mcg Subcutaneous Q Mon-1800   doxercalciferol  4 mcg Intravenous Q M,W,F   feeding supplement (NEPRO CARB STEADY)  1,000 mL Per Tube Q24H  insulin aspart  0-15 Units Subcutaneous Q4H   latanoprost  1 drop Both Eyes QHS   mouth rinse  15 mL Mouth Rinse Q2H   revefenacin  175 mcg Nebulization Daily   rosuvastatin  10 mg Per Tube Daily   sodium chloride flush  10-40 mL Intracatheter Q12H   Zetta Bills, MD 12/31/2022, 8:13 AM

## 2022-12-31 NOTE — Progress Notes (Signed)
[facility] ADULT ICU REPLACEMENT PROTOCOL   The patient does apply for the [facility] Adult ICU Electrolyte Replacment Protocol based on the criteria listed below:   1.Exclusion criteria: TCTS, ECMO, Dialysis, and Myasthenia Gravis patients 2. Is GFR >/= 30 ml/min? Yes.    Patient's GFR today is 50 3. Is SCr </= 2? Yes.   Patient's SCr is 1.40 mg/dL 4. Did SCr increase >/= 0.5 in 24 hours? No. 5.Pt's weight >40kg  Yes.   6. Abnormal electrolyte(s): potassim 3.6, phos 1.8  7. Electrolytes replaced per protocol 8.  Call MD STAT for K+ </= 2.5, Phos </= 1, or Mag </= 1 Physician:  protocol  Melvern Banker 12/31/2022 5:50 AM

## 2022-12-31 NOTE — Progress Notes (Signed)
PHARMACY - ANTICOAGULATION CONSULT NOTE  Pharmacy Consult for Heparin  Indication: Mesenteric ischemia s/p angioplasty/stenting of SMA  Allergies  Allergen Reactions   Atorvastatin Other (See Comments)    Muscle aches    Patient Measurements: Height: 5\' 9"  (175.3 cm) Weight: 83.2 kg (183 lb 6.8 oz) IBW/kg (Calculated) : 70.7  Vital Signs: Temp: 98.1 F (36.7 C) (11/06 0700) Temp Source: Axillary (11/06 0700) BP: 114/39 (11/06 1000) Pulse Rate: 65 (11/06 1015)  Labs: Recent Labs    12/29/22 0352 12/29/22 0741 12/30/22 0545 12/30/22 1520 12/31/22 0342  HGB 8.3*  --  8.4*  --  7.8*  HCT 25.4*  --  26.5*  --  24.5*  PLT 151  --  145*  --  154  HEPARINUNFRC  --  0.24* 0.22*  --  0.21*  CREATININE 1.55*  --  1.49* 1.52* 1.40*    Estimated Creatinine Clearance: 41.4 mL/min (A) (by C-G formula based on SCr of 1.4 mg/dL (H)).   Medical History: Past Medical History:  Diagnosis Date   Asbestosis (HCC)    Contraindication to percutaneous coronary intervention (PCI)    Coronary artery disease    Dyslipidemia    Dyspnea    ESRD on hemodialysis (HCC)    History of anemia    Hypercholesterolemia    Hypertension    Ileus (HCC)    Pleural effusion on left    Pneumonia    Sleep apnea     Assessment: 82 y/o M with mesenteric ischemia s/p OR with vascular and general surgery. S/P angioplasty and stenting of the SMA and right hemi-colectomy.  Heparin level 0.21 is within established low goal range on heparin drip rate 1100 uts/hr.  CBC stable (Hgb 8, pltc 140s).  No issues with infusion or bleeding per RN.  Added back po antiplatelet therapy and starting trickle feeds today  Goal of Therapy:  Heparin level 0.2-0.5 units/ml Monitor platelets by anticoagulation protocol: Yes   Plan:   Continue IV heparin at 1100 units/hr Monitor daily heparin level, CBC, signs/symptoms of bleeding     Leota Sauers Pharm.D. CPP, BCPS Clinical  Pharmacist [phone] 12/31/2022 10:29 AM

## 2022-12-31 NOTE — Progress Notes (Signed)
Patient ID: Ines Warf, male   DOB: 1940-07-29, 82 y.o.   MRN: [id number] 4 Days Post-Op    Subjective: Remains on vent, on levo and vaso ROS negative except as listed above. Objective: Vital signs in last 24 hours: Temp:  [98.1 F (36.7 C)-99.6 F (37.6 C)] 98.5 F (36.9 C) (11/06 0358) Pulse Rate:  [57-75] 62 (11/06 0615) Resp:  [16-40] 18 (11/06 0615) BP: (102-163)/(36-62) 122/36 (11/06 0615) SpO2:  [96 %-100 %] 100 % (11/06 0615) FiO2 (%):  [40 %] 40 % (11/06 0319) Weight:  [83.2 kg] 83.2 kg (11/06 0500) Last BM Date : 12/30/22  Intake/Output from previous day: 11/05 0701 - 11/06 0700 In: 4268.6 [I.V.:3394.3; NG/GT:90; IV Piggyback:784.3] Out: 4393.2 [Emesis/NG output:280; Drains:66; Stool:50] Intake/Output this shift: No intake/output data recorded.  General appearance: no distress GI: soft, wound K, ostomy with output, drain SS  Lab Results: CBC  Recent Labs    12/30/22 0545 12/31/22 0342  WBC 8.0 7.0  HGB 8.4* 7.8*  HCT 26.5* 24.5*  PLT 145* 154   BMET Recent Labs    12/30/22 1520 12/31/22 0342  NA 131* 131*  K 3.6 3.6  CL 101 100  CO2 26 25  GLUCOSE 151* 162*  BUN 29* 32*  CREATININE 1.52* 1.40*  CALCIUM 7.8* 8.0*   PT/INR No results for input(s): "LABPROT", "INR" in the last 72 hours. ABG Recent Labs    12/28/22 1225  PHART 7.361  HCO3 24.4    Studies/Results: No results found.  Anti-infectives: Anti-infectives (From admission, onward)    Start     Dose/Rate Route Frequency Ordered Stop   12/26/22 1800  piperacillin-tazobactam (ZOSYN) IVPB 3.375 g        3.375 g 12.5 mL/hr over 240 Minutes Intravenous Every 8 hours 12/26/22 1615     12/25/22 2200  piperacillin-tazobactam (ZOSYN) IVPB 2.25 g  Status:  Discontinued        2.25 g 100 mL/hr over 30 Minutes Intravenous Every 8 hours 12/25/22 2112 12/26/22 1615       Assessment/Plan: Acute mesenteric ischemia -  -s/p exlap, R hemicolectomy by Dr. Azucena Cecil 11/1 just after  midnight. Left in discontinuity with abthera. Also underwent revascularization with angioplasty of SMA by Dr. Lenell Antu.  -S/p washout, end ileostomy, JP drain placement and abdominal closure 11/2 -WBC down to 7 -bowel function starting so will start nepro at 20cc/h today  FEN - OK for meds per tube, continue TNA DVT - SCDs, heparin gtt as above Dispo - ICU   LOS: 6 days    Violeta Gelinas, MD, MPH, FACS Trauma & General Surgery Use [email or web address] to contact on call provider  12/31/2022

## 2022-12-31 NOTE — Plan of Care (Signed)
  Problem: Clinical Measurements: Goal: Respiratory complications will improve Outcome: Progressing Goal: Cardiovascular complication will be avoided Outcome: Progressing   

## 2022-12-31 NOTE — Progress Notes (Signed)
Pharmacy Antibiotic Note  Peter Oneill is a 82 y.o. male admitted on 12/25/2022 with  intra-abdominal infection, concern for mesenteric ischemia .  Pharmacy has been consulted for zosyn dosing. Noted ESRD - last dialysis on 10/31.  Currently on CRRT. WBC wnl afebrile   Plan: Continue Zosyn 3.375g IV q 8 hrs EI (CRRT dosing)  Height: 5\' 9"  (175.3 cm) Weight: 83.2 kg (183 lb 6.8 oz) IBW/kg (Calculated) : 70.7  Temp (24hrs), Avg:98.4 F (36.9 C), Min:98.1 F (36.7 C), Max:98.6 F (37 C)  Recent Labs  Lab 12/26/22 1028 12/26/22 1050 12/26/22 1338 12/26/22 1529 12/26/22 1609 12/26/22 1658 12/26/22 1700 12/26/22 2001 12/27/22 0327 12/28/22 0513 12/28/22 1646 12/29/22 0352 12/30/22 0545 12/30/22 1520 12/31/22 0342  WBC  --   --   --   --   --    < >  --  12.7*   < > 12.7* 11.2* 10.9* 8.0  --  7.0  CREATININE  --    < >  --    < >  --   --   --   --    < > 1.78*  --  1.55* 1.49* 1.52* 1.40*  LATICACIDVEN 2.7*  --  2.8*  --  2.1*  --  2.6* 1.7  --   --   --   --   --   --   --    < > = values in this interval not displayed.    Estimated Creatinine Clearance: 41.4 mL/min (A) (by C-G formula based on SCr of 1.4 mg/dL (H)).    Allergies  Allergen Reactions   Atorvastatin Other (See Comments)    Muscle aches   No cultures.  Leota Sauers Pharm.D. CPP, BCPS Clinical Pharmacist [phone] 12/31/2022 10:56 AM   [facility] pharmacy phone numbers are listed on [email or web address]

## 2022-12-31 NOTE — Consult Note (Addendum)
WOC Nurse Consult Note: Reason for Consult: Deep tissue injury to sacrococcygeal area.  Intact maroon discoloration with early peeling epithelium Wound type: Deep tissue injury Pressure Injury POA: Yes  chart review and discussion with wife reveals the wound has been present for over  year and has been treated with barrier cream.  Measurement: Erythema 5 cm x 4 cm with 3 cm x 2 cm intact maroon discoloration.   Wound ZOX:WRUEAV maroon  Drainage (amount, consistency, odor) none Periwound: intact Dressing procedure/placement/frequency: Cleanse sacral wound with soap and water and pat dry. Apply alginate (LAWSON # [id number]) dressing to wound.  Cover with  silicone foam.  Change every other day.   Will follow.  Mike Gip MSN, RN, FNP-BC CWON Wound, Ostomy, Continence Nurse Outpatient [facility] [phone] Pager [phone]

## 2022-12-31 NOTE — Progress Notes (Signed)
Nutrition Follow-up  DOCUMENTATION CODES:   Non-severe (moderate) malnutrition in context of chronic illness  INTERVENTION:   Continue TPN  Recommend considering Cortrak placement  Tube Feeding: Vital 1.5 at 20 ml/hr Goal: Vital 1.5 at 50 ml/hr with Pro-Source TF20 60 mL QID TF at goal provides 2120 kcals, 161 g of protein and 912 mL of free water   NUTRITION DIAGNOSIS:   Moderate Malnutrition related to chronic illness (ESRD on HD) as evidenced by mild muscle depletion, mild fat depletion.  Being addressed via TF, TPN  GOAL:   Patient will meet greater than or equal to 90% of their needs  Progressing  MONITOR:   Vent status, Labs, Weight trends, TF tolerance, Skin, I & O's (TPN)  REASON FOR ASSESSMENT:   Consult New TPN/TNA  ASSESSMENT:   82 yo male admitted with acute mesenteric ischemia. PMH includes HTN, HLD, CAD, CABG, sleep apnea, ESRD on HD, asbestos exposure.  11/01 angioplasty and stenting of SMA, CRRT initiated, ex lap with R hemicolectomy and wound VAC placement, bowel left in discontinuity. 11/02 small bowel resection, creation of end ileostomy, JP drain, abd closure 11/03 TPN initiated  Pt remains on vent support. Remains on levophed at 10, vasopressin 0.04  Receiving 8%AA/10%Dextrose at 82 ml/hr with 250 mL 20% Lipid  Noted Nepro at 20 ml/hr ordered by MD this AM. OG tube tip below diaphragm per chest xray report  Phosphorus remains low, noted pt received phosphorus this AM. Pt would likely benefit from aggressive and daily phosphorus supplementation while on CRRT  Nepro formula is not recommending in patients on CRRT; currently potassium wdl and phosphorus low requiring daily supplementation. Pt also high risk of feeding intolerance and on pressors x 2; recommend semi elemental calorically dense formula  +ileostomy with green liquid stool output  Weight down to 83.2 kg; outpatient EDW 79.6 kg  Labs: phosphorus 1.8 (L), sodium 131 (L),  potassium 3.6 (wdl), magnesium 2.2 (wdl) Meds: ss novolog    Diet Order:   Diet Order             Diet NPO time specified  Diet effective now                   EDUCATION NEEDS:   Not appropriate for education at this time  Skin:  Skin Assessment: Skin Integrity Issues: Skin Integrity Issues:: DTI DTI: sacrococcygeal area (present x 1 year per pt) Wound Vac: abdominal incision Other: L ear laceration  Last BM:  11/05  Height:   Ht Readings from Last 1 Encounters:  12/25/22 5\' 9"  (1.753 m)    Weight:   Wt Readings from Last 1 Encounters:  12/31/22 83.2 kg    Ideal Body Weight:  72.7 kg  BMI:  Body mass index is 27.09 kg/m.  Estimated Nutritional Needs:   Kcal:  2100-2400 kcals  Protein:  145-180 g  Fluid:  1 L + UOP   Romelle Starcher MS, RDN, LDN, CNSC Registered Dietitian 3 Clinical Nutrition RD Pager and On-Call Pager Number Located in [place]

## 2022-12-31 NOTE — Progress Notes (Signed)
Non-blanchable area 5cm x 4 cm , reddened with 2cm x 2cm center deeper purple DTI

## 2022-12-31 NOTE — Consult Note (Signed)
WOC Nurse ostomy follow up Stoma type/location: RMQ ileostomy Stomal assessment/size: 7/8" flush and dusky, productive of green liquid stool.  Peristomal assessment: intact skin  midline incision Treatment options for stomal/peristomal skin: barrier ring and soft convex pouch Output liquid green stool Ostomy pouching: 1pc. Convex with barrier ring  Education provided: None  patient intubated and sedated.  Arouses when spoken to. Enrolled patient in [place] Secure Start Discharge program: Yes Will follow.  Mike Gip MSN, RN, FNP-BC CWON Wound, Ostomy, Continence Nurse Outpatient [facility] [phone] Pager [phone]

## 2022-12-31 NOTE — Progress Notes (Signed)
PHARMACY - TOTAL PARENTERAL NUTRITION CONSULT NOTE   Indication:  bowel discontinuity  Patient Measurements: Height: 5\' 9"  (175.3 cm) Weight: 83.2 kg (183 lb 6.8 oz) IBW/kg (Calculated) : 70.7 TPN AdjBW (KG): 81.2 Body mass index is 27.09 kg/m.  Assessment: 82 years of age male with moderate malnutrition at baseline due to chronic illness including ESRD on HD who presented with acute mesenteric ischemia s/p ex lap with R hemicolectomy, angioplasty and stenting of SMA, wound VAC placement, and bowel left in discontinuity on 11/1. Pharmacy consulted for TPN.   Glucose / Insulin: CBGs <180. 12 units mSSI utilized last 24 hours. Electrolytes: Na 131, K 3.6, Phos 1.8 (s/p NaPhos), Mg 2.2, coCa 9.6. Others within normal limits.  Renal: ESRD on HD TTS outpatient, last HD 10/31, now requiring CRRT 11/1 >>  SCr down to 1.4 Hepatic: AST/ALT/AlkPhos WNL. Tbili up 1.3. Albumin 2. TG 119 (11/4) Intake / Output; MIVF: No UOP. CRRT removal 3933 mL. NGT output 280 mL. JP driain output 66 mL. Ileostomy output 50 mL. Net +5.77L.  GI Imaging: None since start of TPN GI Surgeries / Procedures:  None since start of TPN  Central access: CVC (double lumen) placed 11/1 - RN to dedicate lumen for TPN. TPN start date: 12/28/22  Nutritional Goals: Clinimix 8/10 2 L without Lytes x 7 days @ 82 mL/hr - SMOFlipid 250 mL daily Will provide 100% AA + 83% kcal needs Monitor and consider if should be exception for compounded TPN to help with volume.   RD Assessment: Estimated Needs Total Energy Estimated Needs: 2400-2600 Total Protein Estimated Needs: 160-200 g Total Fluid Estimated Needs: 1 L + UOP  Current Nutrition:  NPO and TPN  Plan:  Continue Clinimix 8/10 at 1800 at 41ml/hr  - 2 L without Lytes  - SMOFlipid 250 mL daily  Will provide 100% AA and 83% of kcal needs.  No electrolytes for now with AKI-CRRT needs. Will re-assess daily.   Continue adding standard MVI and trace elements to  TPN Give KPhos IV x1 in addition to TPN Continue Moderate q4h SSI and adjust as needed  Recheck phosphorous and potassium at 1600 and replete prn Monitor TPN labs daily while on Clinimix, TGs qMon  F/u ability to initiate TF, per general surgery guidance   Wilburn Cornelia, PharmD, BCPS Clinical Pharmacist 12/31/2022 7:12 AM   Please refer to [facility] for pharmacy phone number

## 2022-12-31 NOTE — Progress Notes (Signed)
Wife states that patient has had DTI on his coccyx for over a year. Patient has been seen at the [facility] wound care clinic and has been compliant with their suggestion to apply barrier cream.

## 2022-12-31 NOTE — Progress Notes (Signed)
NAME:  Peter Oneill, MRN:  [id number], DOB:  19-Nov-1940, LOS: 6 ADMISSION DATE:  12/25/2022, CONSULTATION DATE:  12/25/2022 REFERRING MD:  EDP, CHIEF COMPLAINT:  Abdominal Pain   History of Present Illness:  82 year old man with PMHx significant for ESRD, Asbestosis, CAD s/p CABG, HTN, HL, OSA and emphysema followed by Dr. [name] who presented to the ED with acutely worsening abdominal pain that began the day of admission.  He underwent dialysis in the morning and then felt short of breath followed by abdominal discomfort.  His initial oxygen saturation was 70% on EMS arrival and his work-up in the ED included a CT Abd/pelvis concerning for acute mesenteric ischemia secondary to occlusion of the celiac artery and significant stenosis of the superior mesenteric artery and small inferior mesenteric artery.  His labs showed a lactic acid of 1.4, WBC 6.3, creatinine 3.7, BNP >1300, normal Hgb.  He was taken emergently to the OR for urgent revascularization and SMA stenting along with ex-lap.  PCCM asked to admit  Pertinent Medical History:   Past Medical History:  Diagnosis Date   Asbestosis (HCC)    Contraindication to percutaneous coronary intervention (PCI)    Coronary artery disease    Dyslipidemia    Dyspnea    ESRD on hemodialysis (HCC)    History of anemia    Hypercholesterolemia    Hypertension    Ileus (HCC)    Pleural effusion on left    Pneumonia    Sleep apnea    Significant Hospital Events: Including procedures, antibiotic start and stop dates in addition to other pertinent events   10/31 Presented with mesenteric ischemia, occlusion of the celiac and significant stenosis of the SMA and small inferior mesenteric artery. Went to OR for urgent revascularization and SMA stenting along with ex-lap. Right hemicolectomy for necrotic cecum. Significant pressor requirement and bleeding (800 cc 1st hour) from abdominal wound vac post-op, given 4L PRBC's, 500cc albumin, 500cc NS and  was on Levophed , Neo , Vaso 0.04 and Epi . Pressors were titrated down with resuscitation.  11/1. CVL placed for pressors. LE warm . Weaning pressors. Orthostatic w/ positional change. 2.2 liters positive. Changing prop to dex. Drain output decreased  11/2 Back to OR for washout, end ileostomy, and closure 11/4 Pressors weaning. SBT  11/6 starting nepro   Interim History / Subjective:  NAEO  Remains on NE and vaso, and on CRRT  Phos is still low  Hgb dropped to 7.8   Objective:  Blood pressure (!) 144/40, pulse 75, temperature 98.1 F (36.7 C), temperature source Axillary, resp. rate (!) 28, height 5\' 9"  (1.753 m), weight 83.2 kg, SpO2 100%. CVP:  [12 mmHg-37 mmHg] 37 mmHg  Vent Mode: PRVC FiO2 (%):  [40 %] 40 % Set Rate:  [14 bmp] 14 bmp Vt Set:  [490 mL] 490 mL PEEP:  [5 cmH20] 5 cmH20 Plateau Pressure:  [21 cmH20-23 cmH20] 23 cmH20   Intake/Output Summary (Last 24 hours) at 12/31/2022 0958 Last data filed at 12/31/2022 0941 Gross per 24 hour  Intake 4307.87 ml  Output 4431 ml  Net -123.13 ml   Filed Weights   12/29/22 0500 12/30/22 0500 12/31/22 0500  Weight: 84.5 kg 83.7 kg 83.2 kg   Physical Examination: General: critically ill appearing elderly M intubated lightly sedated NAD  Neuro: Lightly sedated. Awakens and follows commands. Generalized weakness  HQ:IONGE rhythm. S1s2 cap refill < 3 sec  PULM: Symmetrical chest expansion, even unlabored on MV. Overbreathing vent  XB:MWUX +  bowel sounds. Ostomy  Extremities: non-pitting BLE edema  Skin: c/d/w   Resolved Hospital Problem List:    Assessment & Plan:    Acute resp failure w hypoxia and hypercarbia Hx emphysema Hx asbestosis  P -cont MV support, wean as able  -has done some SBT the last few days, will try again. Ideally would like a lower pressor req and volume off before we extubate -VAP, pulm hygiene -Bronchodilators  -on fent gtt + PRN BZD for sedation. Wean as able.   Acute mesenteric  ischemia, ischemic bowel  -s/p ex lap x2, resection of lg bowel w ileostomy and angioplasty of SMA and closure  P -hep gtt -post op per [facility] VVS  -Trial low rate EN 11/6-- has been on TPN. Plans to adv per ccs  -zosyn  -ASA, plavix, statin  - WOC for ileostomy  Shock, mixed state (septic, vasoplegic. Think prior hypovolemia is largely improved )  P - NE, vaso for MAP > 65  -as above zosyn   ABLA P -send T&S 11/6 w drop in hgb to 7.8 and ongoing pressor req  -if worse hemodynamics low threshold for PRBC  -follow CBC -- will check one 11/6 PM and again 11/7 AM   ESRD on HD Hypophosphatemia  Hypokalemia  Hyponatremia  P - CRRT per nephro, net even while we are trying to make better headway w pressors  -replace kphos -- agree w nephro, prefer to add to TPN when able to limit IVPB  -BID renal fxn panel   pAFib, rate controlled  P - On hep gtt - optimize lytes  -cardiac monitoring   Moderate malnutrition - TPN per Pharmacy. Starting trickle EN 11/6   Best Practice: (right click and "Reselect all SmartList Selections" daily)   Diet/type: NPO - TPN DVT prophylaxis: Systemic heparin GI prophylaxis: H2B Lines: Central line Foley:  Yes, and it is still needed Code Status:  full code Last date of multidisciplinary goals of care discussion: 11/4: Patient's wife was updated at bedside  Critical care time:  CRITICAL CARE Performed by: Lanier Clam   Total critical care time: 42 minutes  Critical care time was exclusive of separately billable procedures and treating other patients. Critical care was necessary to treat or prevent imminent or life-threatening deterioration.  Critical care was time spent personally by me on the following activities: development of treatment plan with patient and/or surrogate as well as nursing, discussions with consultants, evaluation of patient's response to treatment, examination of patient, obtaining history from patient or surrogate,  ordering and performing treatments and interventions, ordering and review of laboratory studies, ordering and review of radiographic studies, pulse oximetry and re-evaluation of patient's condition.  Tessie Fass MSN, AGACNP-BC [facility] Amion for pager 12/31/2022, 9:58 AM

## 2023-01-01 DIAGNOSIS — Z9889 Other specified postprocedural states: Secondary | ICD-10-CM | POA: Diagnosis not present

## 2023-01-01 LAB — COMPREHENSIVE METABOLIC PANEL
ALT: 23 U/L (ref 0–44)
AST: 25 U/L (ref 15–41)
Albumin: 2 g/dL — ABNORMAL LOW (ref 3.5–5.0)
Alkaline Phosphatase: 90 U/L (ref 38–126)
Anion gap: 7 (ref 5–15)
BUN: 32 mg/dL — ABNORMAL HIGH (ref 8–23)
CO2: 23 mmol/L (ref 22–32)
Calcium: 8.3 mg/dL — ABNORMAL LOW (ref 8.9–10.3)
Chloride: 101 mmol/L (ref 98–111)
Creatinine, Ser: 1.29 mg/dL — ABNORMAL HIGH (ref 0.61–1.24)
GFR, Estimated: 56 mL/min — ABNORMAL LOW (ref >60)
Glucose, Bld: 151 mg/dL — ABNORMAL HIGH (ref 70–99)
Potassium: 4 mmol/L (ref 3.5–5.1)
Sodium: 131 mmol/L — ABNORMAL LOW (ref 135–145)
Total Bilirubin: 1.6 mg/dL — ABNORMAL HIGH (ref <1.2)
Total Protein: 5.6 g/dL — ABNORMAL LOW (ref 6.5–8.1)

## 2023-01-01 LAB — CBC
HCT: 24 % — ABNORMAL LOW (ref 39.0–52.0)
Hemoglobin: 7.7 g/dL — ABNORMAL LOW (ref 13.0–17.0)
MCH: 31.4 pg (ref 26.0–34.0)
MCHC: 32.1 g/dL (ref 30.0–36.0)
MCV: 98 fL (ref 80.0–100.0)
Platelets: 158 10*3/uL (ref 150–400)
RBC: 2.45 MIL/uL — ABNORMAL LOW (ref 4.22–5.81)
RDW: 16.6 % — ABNORMAL HIGH (ref 11.5–15.5)
WBC: 5.5 10*3/uL (ref 4.0–10.5)
nRBC: 2.4 % — ABNORMAL HIGH (ref 0.0–0.2)

## 2023-01-01 LAB — HEPARIN LEVEL (UNFRACTIONATED): Heparin Unfractionated: 0.23 [IU]/mL — ABNORMAL LOW (ref 0.30–0.70)

## 2023-01-01 LAB — GLUCOSE, CAPILLARY
Glucose-Capillary: 119 mg/dL — ABNORMAL HIGH (ref 70–99)
Glucose-Capillary: 130 mg/dL — ABNORMAL HIGH (ref 70–99)
Glucose-Capillary: 134 mg/dL — ABNORMAL HIGH (ref 70–99)
Glucose-Capillary: 137 mg/dL — ABNORMAL HIGH (ref 70–99)
Glucose-Capillary: 146 mg/dL — ABNORMAL HIGH (ref 70–99)

## 2023-01-01 LAB — RENAL FUNCTION PANEL
Albumin: 1.9 g/dL — ABNORMAL LOW (ref 3.5–5.0)
Anion gap: 5 (ref 5–15)
BUN: 33 mg/dL — ABNORMAL HIGH (ref 8–23)
CO2: 26 mmol/L (ref 22–32)
Calcium: 8.1 mg/dL — ABNORMAL LOW (ref 8.9–10.3)
Chloride: 102 mmol/L (ref 98–111)
Creatinine, Ser: 1.23 mg/dL (ref 0.61–1.24)
GFR, Estimated: 59 mL/min — ABNORMAL LOW (ref >60)
Glucose, Bld: 152 mg/dL — ABNORMAL HIGH (ref 70–99)
Phosphorus: 6.1 mg/dL — ABNORMAL HIGH (ref 2.5–4.6)
Potassium: 3.8 mmol/L (ref 3.5–5.1)
Sodium: 133 mmol/L — ABNORMAL LOW (ref 135–145)

## 2023-01-01 LAB — MAGNESIUM: Magnesium: 2.2 mg/dL (ref 1.7–2.4)

## 2023-01-01 LAB — PHOSPHORUS: Phosphorus: 1.9 mg/dL — ABNORMAL LOW (ref 2.5–4.6)

## 2023-01-01 MED ORDER — HEPARIN SODIUM (PORCINE) 5000 UNIT/ML IJ SOLN
5000.0000 [IU] | Freq: Three times a day (TID) | INTRAMUSCULAR | Status: DC
  Administered 2023-01-01 – 2023-01-03 (×6): 5000 [IU] via SUBCUTANEOUS
  Filled 2023-01-01 (×6): qty 1

## 2023-01-01 MED ORDER — TRACE MINERALS CU-MN-SE-ZN 300-55-60-3000 MCG/ML IV SOLN
INTRAVENOUS | Status: AC
  Filled 2023-01-01: qty 2000

## 2023-01-01 MED ORDER — VITAL 1.5 CAL PO LIQD
1000.0000 mL | ORAL | Status: DC
  Administered 2023-01-01 – 2023-01-03 (×3): 1000 mL

## 2023-01-01 MED ORDER — K PHOS MONO-SOD PHOS DI & MONO 155-852-130 MG PO TABS
250.0000 mg | ORAL_TABLET | Freq: Three times a day (TID) | ORAL | Status: DC
  Filled 2023-01-01 (×2): qty 1

## 2023-01-01 MED ORDER — SODIUM PHOSPHATES 45 MMOLE/15ML IV SOLN
30.0000 mmol | Freq: Once | INTRAVENOUS | Status: AC
  Administered 2023-01-01: 30 mmol via INTRAVENOUS
  Filled 2023-01-01: qty 10

## 2023-01-01 MED ORDER — FAT EMUL FISH OIL/PLANT BASED 20% (SMOFLIPID)IV EMUL
250.0000 mL | INTRAVENOUS | Status: AC
Start: 2023-01-01 — End: 2023-01-02
  Administered 2023-01-01: 250 mL via INTRAVENOUS
  Filled 2023-01-01: qty 250

## 2023-01-01 NOTE — Progress Notes (Signed)
Patient ID: Peter Oneill, male   DOB: [birth year], 82 y.o.   MRN: [id number] [facility] Progress Note   Assessment/ Plan:   1.  Acute mesenteric ischemia with ischemic gut injury: Status post exploratory laparotomy with large bowel resection and ileostomy along with SMA angioplasty.  Ongoing management per surgery and vascular surgery-remains on heparin infusion for SMA stent along with TPN for parenteral nutrition. 2. ESRD: On CRRT since 11/1 in the setting of hemodynamic instability/critical illness and large volume requirements; will continue current prescription and order for increased ultrafiltration as pressor requirements decrease.  Previously on TTS HD as an outpatient. 3. Anemia: Fluctuating hemoglobin/hematocrit postoperatively, will monitor trend and continue ESA. 4. CKD-MBD: Low phosphorus level secondary to ongoing CRRT losses/limited intake.  Ongoing IV replacement, will discuss with pharmacy regarding increasing phosphorus in TPN. 5. Nutrition: On TPN. 6. Hypotension: Pressors being weaned off as he continues to tolerate net even fluid balance on CRRT.  Subjective:   Without acute events overnight, remains on pressors and tolerating CRRT with net even fluid balance.   Objective:   BP (!) 145/49   Pulse 65   Temp 98 F (36.7 C) (Axillary)   Resp (!) 30   Ht 5\' 9"  (1.753 m)   Wt 83.8 kg   SpO2 100%   BMI 27.28 kg/m   Physical Exam: Gen: Intubated, sedation currently off to facilitate wake-up assessment CVS: Pulse regular rhythm, normal rate, S1 and S2 normal Resp: Anteriorly clear to auscultation, no distinct rales or rhonchi Abd: Status post exploratory laparotomy with RLQ ostomy stoma.  Surgical dressing intact Ext: Trace ankle edema.  Left femoral dialysis catheter connected to CRRT.  Left upper arm AV fistula with palpable thrill  Labs: BMET Recent Labs  Lab 12/28/22 0513 12/28/22 1225 12/29/22 0352 12/30/22 0545 12/30/22 1520 12/31/22 0342  12/31/22 1600 01/01/23 0437  NA 136 136 133* 134* 131* 131* 133* 131*  K 4.6 4.4 3.8 4.0 3.6 3.6 4.1 4.0  CL 105  --  102 103 101 100 102 101  CO2 23  --  24 25 26 25 25 23   GLUCOSE 122*  --  143* 71 151* 162* 134* 151*  BUN 22  --  20 25* 29* 32* 32* 32*  CREATININE 1.78*  --  1.55* 1.49* 1.52* 1.40* 1.26* 1.29*  CALCIUM 8.1*  --  8.0* 8.2* 7.8* 8.0* 8.1* 8.3*  PHOS 4.1  --  2.8 1.3* 2.0* 1.8* 3.1 1.9*   CBC Recent Labs  Lab 12/26/22 2001 12/26/22 2007 12/28/22 1646 12/29/22 0352 12/30/22 0545 12/31/22 0342 12/31/22 1600 01/01/23 0437  WBC 12.7*   < > 11.2*   < > 8.0 7.0 5.6 5.5  NEUTROABS 10.7*  --  9.6*  --   --   --   --   --   HGB 9.6*   < > 8.7*   < > 8.4* 7.8* 7.3* 7.7*  HCT 26.7*   < > 26.0*   < > 26.5* 24.5* 23.3* 24.0*  MCV 87.8   < > 93.2   < > 97.4 96.8 97.1 98.0  PLT 117*   < > 142*   < > 145* 154 149* 158   < > = values in this interval not displayed.      Medications:     arformoterol  15 mcg Nebulization BID   aspirin  81 mg Per Tube Daily   Chlorhexidine Gluconate Cloth  6 each Topical Daily   clopidogrel  75 mg  Per Tube Daily   darbepoetin (ARANESP) injection - DIALYSIS  40 mcg Subcutaneous Q Mon-1800   doxercalciferol  4 mcg Intravenous Q M,W,F   insulin aspart  0-15 Units Subcutaneous Q4H   latanoprost  1 drop Both Eyes QHS   mouth rinse  15 mL Mouth Rinse Q2H   revefenacin  175 mcg Nebulization Daily   rosuvastatin  10 mg Per Tube Daily   sodium chloride flush  10-40 mL Intracatheter Q12H   Zetta Bills, MD 01/01/2023, 8:30 AM

## 2023-01-01 NOTE — Progress Notes (Signed)
RT NOTE: attempted SBT on patient this AM on CPAP/PSV of 15/5 at [phone] however patient's RR increased to the 40s.  Placed patient back on full support ventilator settings.  Tolerating well at this time. Will continue to monitor and wean as tolerated.

## 2023-01-01 NOTE — Progress Notes (Signed)
RT NOTE: patient placed on CPAP/PSV of 15/5 at 1118.  Tolerating well at this time.  Will continue to monitor.

## 2023-01-01 NOTE — Plan of Care (Signed)
  Problem: Clinical Measurements: Goal: Cardiovascular complication will be avoided Outcome: Progressing   Problem: Clinical Measurements: Goal: Diagnostic test results will improve Outcome: Progressing   Problem: Nutrition: Goal: Adequate nutrition will be maintained Outcome: Progressing   Problem: Elimination: Goal: Will not experience complications related to bowel motility Outcome: Progressing

## 2023-01-01 NOTE — Progress Notes (Signed)
PHARMACY - TOTAL PARENTERAL NUTRITION CONSULT NOTE   Indication:  bowel discontinuity  Patient Measurements: Height: 5\' 9"  (175.3 cm) Weight: 83.8 kg (184 lb 11.9 oz) IBW/kg (Calculated) : 70.7 TPN AdjBW (KG): 81.2 Body mass index is 27.28 kg/m.  Assessment: 82 years of age male with moderate malnutrition at baseline due to chronic illness including ESRD on HD who presented with acute mesenteric ischemia s/p ex lap with R hemicolectomy, angioplasty and stenting of SMA, wound VAC placement, and bowel left in discontinuity on 11/1. Pharmacy consulted for TPN.   Glucose / Insulin: CBGs <180. 12 units SSI/24hrs Electrolytes: Na 131 down, K 4.0, Phos 1.9 (s/p NaPhos), Mg 2.2, ~CoCa 9.6. Others wnl Renal: BUN 32; ESRD on HD TTS PTA, last HD 10/31, now requiring CRRT 11/1 >>  Hepatic: AST/ALT/AlkPhos WNL. Tbili up 1.6. Albumin 2. TG 119 up (11/4) Intake / Output; MIVF: No UOP. CRRT removal 4559 mL. NGT output 0 mL charted. JP driain output 52 mL. Ileostomy output 100 mL. Net +5.4 L  GI Imaging: None since start of TPN GI Surgeries / Procedures:  None since start of TPN  Central access: CVC (double lumen) placed 11/1 - RN to dedicate lumen for TPN. TPN start date: 12/28/22  Nutritional Goals: Clinimix 8/10 2 L with NaPhos 40 meq (x 7 days @ 82 mL/hr) - SMOFlipid 250 mL daily Will provide 100% AA + 83% kcal needs Monitor and consider if should be exception for compounded TPN to help with volume.   RD Assessment: Estimated Needs Total Energy Estimated Needs: 2100-2400 kcals Total Protein Estimated Needs: 145-180 g Total Fluid Estimated Needs: 1 L + UOP  Current Nutrition:  NPO and TPN TF (64mL/hr, goal 50 mL/hr)  Plan:  Continue Clinimix 8/10 at 1800 at 43ml/hr  - 2 L with NaPhos added (total 47meq/30mmol/24hr) while on CRRT - SMOFlipid 250 mL daily  - Will provide 100% AA and 83% of kcal needs. Continue standard MVI and trace elements to TPN Give NaPhos IV x1 in  addition to TPN  Continue Moderate q4h SSI and adjust as needed  Monitor TPN labs daily while on Clinimix, TGs qMon  F/u toleration of TF, per RD and Gen Surg recs  Thank you for allowing pharmacy to be a part of this patient's care.  Thelma Barge, PharmD Clinical Pharmacist

## 2023-01-01 NOTE — Progress Notes (Addendum)
NAME:  Peter Oneill, MRN:  [id number], DOB:  1940/04/10, LOS: 7 ADMISSION DATE:  12/25/2022, CONSULTATION DATE:  12/25/2022 REFERRING MD:  EDP, CHIEF COMPLAINT:  Abdominal Pain   History of Present Illness:  82 year old man with PMHx significant for ESRD, Asbestosis, CAD s/p CABG, HTN, HL, OSA and emphysema followed by Dr. [name] who presented to the ED with acutely worsening abdominal pain that began the day of admission.  He underwent dialysis in the morning and then felt short of breath followed by abdominal discomfort.  His initial oxygen saturation was 70% on EMS arrival and his work-up in the ED included a CT Abd/pelvis concerning for acute mesenteric ischemia secondary to occlusion of the celiac artery and significant stenosis of the superior mesenteric artery and small inferior mesenteric artery.  His labs showed a lactic acid of 1.4, WBC 6.3, creatinine 3.7, BNP >1300, normal Hgb.  He was taken emergently to the OR for urgent revascularization and SMA stenting along with ex-lap.  PCCM asked to admit  Pertinent Medical History:   Past Medical History:  Diagnosis Date   Asbestosis (HCC)    Contraindication to percutaneous coronary intervention (PCI)    Coronary artery disease    Dyslipidemia    Dyspnea    ESRD on hemodialysis (HCC)    History of anemia    Hypercholesterolemia    Hypertension    Ileus (HCC)    Pleural effusion on left    Pneumonia    Sleep apnea    Significant Hospital Events: Including procedures, antibiotic start and stop dates in addition to other pertinent events   10/31 Presented with mesenteric ischemia, occlusion of the celiac and significant stenosis of the SMA and small inferior mesenteric artery. Went to OR for urgent revascularization and SMA stenting along with ex-lap. Right hemicolectomy for necrotic cecum. Significant pressor requirement and bleeding (800 cc 1st hour) from abdominal wound vac post-op, given 4L PRBC's, 500cc albumin, 500cc NS and  was on Levophed , Neo , Vaso 0.04 and Epi . Pressors were titrated down with resuscitation.  11/1. CVL placed for pressors. LE warm . Weaning pressors. Orthostatic w/ positional change. 2.2 liters positive. Changing prop to dex. Drain output decreased  11/2 Back to OR for washout, end ileostomy, and closure 11/4 Pressors weaning. SBT  11/6 starting nepro  11/7 increasing TF from 20 to 40, hoping to pull some volume on CRRT prior to considering extubation  Interim History / Subjective:  NAEON. Remains on 11 NE, 0.04 Vaso CCS increasing TF from 20 to 40. Running even on CRRT. Currently +5.5L net since admit.  Objective:  Blood pressure (!) 145/49, pulse 65, temperature 98 F (36.7 C), temperature source Axillary, resp. rate (!) 30, height 5\' 9"  (1.753 m), weight 83.8 kg, SpO2 100%. CVP:  [10 mmHg-38 mmHg] 10 mmHg  Vent Mode: PRVC FiO2 (%):  [40 %] 40 % Set Rate:  [14 bmp] 14 bmp Vt Set:  [490 mL] 490 mL PEEP:  [5 cmH20] 5 cmH20 Plateau Pressure:  [23 cmH20-25 cmH20] 23 cmH20   Intake/Output Summary (Last 24 hours) at 01/01/2023 0809 Last data filed at 01/01/2023 0800 Gross per 24 hour  Intake 4325.88 ml  Output 4651.3 ml  Net -325.42 ml   Filed Weights   12/30/22 0500 12/31/22 0500 01/01/23 0300  Weight: 83.7 kg 83.2 kg 83.8 kg   Physical Examination: General: Adult male, critically ill but in NAD. Neuro: Awake despite some sedation. Follows intermittent basic commands. HEENT: [place]/AT. Sclerae anicteric. ETT  in place. Cardiovascular: RRR, no M/R/G.  Lungs: Respirations even and unlabored.  CTA bilaterally, No W/R/R. Abdomen: Dressings and Ostomy in place and clean. BS x 4, soft, NT/ND.  Musculoskeletal: No gross deformities, mild edema.  Skin: Intact, warm, no rashes.   Assessment & Plan:    Acute resp failure w hypoxia and hypercarbia Hx emphysema Hx asbestosis  P -cont MV support - has done some SBT the last few days, will try again but today but  ideally would like a little volume off prior to extubation if possible. Pressor requirements stable and don't feel this is a hurdle at present - VAP, pulm hygiene - Bronchodilators  - on fent gtt + PRN BZD for sedation. Wean as able.   Acute mesenteric ischemia, ischemic bowel  -s/p ex lap x2, resection of lg bowel w ileostomy and angioplasty of SMA and closure  P - Continue hep gtt, ASA, plavix, statin - post op per CCS and VVS  - Continue TPN - Advance TF from 20 to 40 per CCS - Continue Zosyn   - WOC for ileostomy  Shock, mixed state (septic, vasoplegic. Think prior hypovolemia is largely improved )  P - NE, vaso for MAP > 65  - as above zosyn   ABLA - stable P - Transfuse for Hgb < 7 - Follow CBC  ESRD on HD Hypophosphatemia  Hyponatremia  P - CRRT per nephro, try some volume removal today  hopefully - Replace Na Phos today but will likely need to adjust TPN as seems to be recurring issue here with CRRT -BID renal fxn panel   pAFib, rate controlled  P - On hep gtt - optimize lytes   Moderate malnutrition - TPN per Pharmacy. - Continue TF, increase from 20 to 40 per CCS  Best Practice: (right click and "Reselect all SmartList Selections" daily)   Diet/type: NPO - TPN and TF DVT prophylaxis: Systemic heparin GI prophylaxis: H2B Lines: Central line Foley:  Yes, and it is still needed Code Status:  full code Last date of multidisciplinary goals of care discussion: 11/4: Patient's wife was updated at bedside  Critical care time: 35 min.   Rutherford Guys, PA - [facility] For pager details, please see AMION or use Epic chat  After 1900, please call [facility] for cross coverage needs 01/01/2023, 8:18 AM

## 2023-01-01 NOTE — Progress Notes (Signed)
Patient ID: Peter Oneill, male   DOB: 13-Nov-1940, 82 y.o.   MRN: [id number] 5 Days Post-Op    Subjective: On vent Levo and vaso Fentanyl held for W/U assessment  ROS negative except as listed above. Objective: Vital signs in last 24 hours: Temp:  [97.9 F (36.6 C)-99.2 F (37.3 C)] 98 F (36.7 C) (11/07 0403) Pulse Rate:  [52-77] 65 (11/07 0700) Resp:  [14-32] 30 (11/07 0700) BP: (98-147)/(30-59) 145/49 (11/07 0700) SpO2:  [87 %-100 %] 100 % (11/07 0700) FiO2 (%):  [40 %] 40 % (11/07 0440) Weight:  [83.8 kg] 83.8 kg (11/07 0300) Last BM Date : 12/30/22  Intake/Output from previous day: 11/06 0701 - 11/07 0700 In: 4393.5 [I.V.:3133.4; NG/GT:523.3; IV Piggyback:736.8] Out: 4711.3 [Drains:52; Stool:100] Intake/Output this shift: Total I/O In: 137.5 [I.V.:116.2; NG/GT:20; IV Piggyback:1.3] Out: -   General appearance: alert GI: soft, wound OK, stoma pale, dusky but a good amount of liquid stool , JP SS  Lab Results: CBC  Recent Labs    12/31/22 1600 01/01/23 0437  WBC 5.6 5.5  HGB 7.3* 7.7*  HCT 23.3* 24.0*  PLT 149* 158   BMET Recent Labs    12/31/22 1600 01/01/23 0437  NA 133* 131*  K 4.1 4.0  CL 102 101  CO2 25 23  GLUCOSE 134* 151*  BUN 32* 32*  CREATININE 1.26* 1.29*  CALCIUM 8.1* 8.3*   PT/INR No results for input(s): "LABPROT", "INR" in the last 72 hours. ABG No results for input(s): "PHART", "HCO3" in the last 72 hours.  Invalid input(s): "PCO2", "PO2"  Studies/Results: No results found.  Anti-infectives: Anti-infectives (From admission, onward)    Start     Dose/Rate Route Frequency Ordered Stop   12/26/22 1800  piperacillin-tazobactam (ZOSYN) IVPB 3.375 g        3.375 g 12.5 mL/hr over 240 Minutes Intravenous Every 8 hours 12/26/22 1615     12/25/22 2200  piperacillin-tazobactam (ZOSYN) IVPB 2.25 g  Status:  Discontinued        2.25 g 100 mL/hr over 30 Minutes Intravenous Every 8 hours 12/25/22 2112 12/26/22 1615        Assessment/Plan: Acute mesenteric ischemia -  -s/p exlap, R hemicolectomy by Dr. Azucena Cecil 11/1 just after midnight. Left in discontinuity with abthera. Also underwent revascularization with angioplasty of SMA by Dr. Lenell Antu.  -S/p washout, end ileostomy, JP drain placement and abdominal closure 11/2 -WBC down to 5.5 -tolerated TF at 20/h and has had increased output from ileostomy, will increase Vital TF to 40/h today  FEN - OK for meds per tube, continue TNA DVT - SCDs, heparin gtt as above Dispo - ICU   I D/W CCM team  LOS: 7 days    Violeta Gelinas, MD, MPH, FACS Trauma & General Surgery Use [email or web address] to contact on call provider  01/01/2023

## 2023-01-01 NOTE — Care Management (Signed)
Transition of Care [facility]) - Inpatient Brief Assessment   Patient Details  Name: Affan Callow MRN: [id number] Date of Birth: September 03, 1940  Transition of Care [facility]) CM/SW Contact:    Lockie Pares, RN Phone Number: 01/01/2023, 4:43 PM   Clinical Narrative: 82 year old presented with abd pain, expl lap with bowel resection and SMA stenting. He is currently on CVVHD. and ventilated. Marland Kitchen History of ESRD, CAD and COPD.  TOC will follow for needs, recommendations, and transitions of care.   Transition of Care Asessment: Insurance and Status: Insurance coverage has been reviewed   Home environment has been reviewed: Lives with spouse Prior level of function:: Audiological scientist Home Services: No current home services Social Determinants of Health Reivew: SDOH reviewed no interventions necessary Readmission risk has been reviewed: Yes Transition of care needs: transition of care needs identified, TOC will continue to follow

## 2023-01-02 DIAGNOSIS — R579 Shock, unspecified: Secondary | ICD-10-CM | POA: Diagnosis not present

## 2023-01-02 DIAGNOSIS — J9601 Acute respiratory failure with hypoxia: Secondary | ICD-10-CM | POA: Diagnosis not present

## 2023-01-02 DIAGNOSIS — E440 Moderate protein-calorie malnutrition: Secondary | ICD-10-CM | POA: Diagnosis not present

## 2023-01-02 DIAGNOSIS — K559 Vascular disorder of intestine, unspecified: Secondary | ICD-10-CM | POA: Diagnosis not present

## 2023-01-02 LAB — MAGNESIUM: Magnesium: 2.2 mg/dL (ref 1.7–2.4)

## 2023-01-02 LAB — GLUCOSE, CAPILLARY
Glucose-Capillary: 136 mg/dL — ABNORMAL HIGH (ref 70–99)
Glucose-Capillary: 137 mg/dL — ABNORMAL HIGH (ref 70–99)
Glucose-Capillary: 146 mg/dL — ABNORMAL HIGH (ref 70–99)
Glucose-Capillary: 152 mg/dL — ABNORMAL HIGH (ref 70–99)
Glucose-Capillary: 152 mg/dL — ABNORMAL HIGH (ref 70–99)
Glucose-Capillary: 155 mg/dL — ABNORMAL HIGH (ref 70–99)
Glucose-Capillary: 158 mg/dL — ABNORMAL HIGH (ref 70–99)
Glucose-Capillary: 166 mg/dL — ABNORMAL HIGH (ref 70–99)

## 2023-01-02 LAB — COOXEMETRY PANEL
Carboxyhemoglobin: 2.5 % — ABNORMAL HIGH (ref 0.5–1.5)
Methemoglobin: <0.7 % (ref 0.0–1.5)
O2 Saturation: 93.9 %
Total hemoglobin: 7.3 g/dL — ABNORMAL LOW (ref 12.0–16.0)

## 2023-01-02 LAB — COMPREHENSIVE METABOLIC PANEL
ALT: 24 U/L (ref 0–44)
AST: 27 U/L (ref 15–41)
Albumin: 2 g/dL — ABNORMAL LOW (ref 3.5–5.0)
Alkaline Phosphatase: 129 U/L — ABNORMAL HIGH (ref 38–126)
Anion gap: 10 (ref 5–15)
BUN: 36 mg/dL — ABNORMAL HIGH (ref 8–23)
CO2: 24 mmol/L (ref 22–32)
Calcium: 8 mg/dL — ABNORMAL LOW (ref 8.9–10.3)
Chloride: 98 mmol/L (ref 98–111)
Creatinine, Ser: 1.24 mg/dL (ref 0.61–1.24)
GFR, Estimated: 58 mL/min — ABNORMAL LOW (ref >60)
Glucose, Bld: 167 mg/dL — ABNORMAL HIGH (ref 70–99)
Potassium: 3.9 mmol/L (ref 3.5–5.1)
Sodium: 132 mmol/L — ABNORMAL LOW (ref 135–145)
Total Bilirubin: 1 mg/dL (ref <1.2)
Total Protein: 6.1 g/dL — ABNORMAL LOW (ref 6.5–8.1)

## 2023-01-02 LAB — CBC
HCT: 24.4 % — ABNORMAL LOW (ref 39.0–52.0)
Hemoglobin: 7.6 g/dL — ABNORMAL LOW (ref 13.0–17.0)
MCH: 31.1 pg (ref 26.0–34.0)
MCHC: 31.1 g/dL (ref 30.0–36.0)
MCV: 100 fL (ref 80.0–100.0)
Platelets: 177 10*3/uL (ref 150–400)
RBC: 2.44 MIL/uL — ABNORMAL LOW (ref 4.22–5.81)
RDW: 17 % — ABNORMAL HIGH (ref 11.5–15.5)
WBC: 6.6 10*3/uL (ref 4.0–10.5)
nRBC: 3.7 % — ABNORMAL HIGH (ref 0.0–0.2)

## 2023-01-02 LAB — RENAL FUNCTION PANEL
Albumin: 1.9 g/dL — ABNORMAL LOW (ref 3.5–5.0)
Anion gap: 7 (ref 5–15)
BUN: 41 mg/dL — ABNORMAL HIGH (ref 8–23)
CO2: 23 mmol/L (ref 22–32)
Calcium: 8 mg/dL — ABNORMAL LOW (ref 8.9–10.3)
Chloride: 102 mmol/L (ref 98–111)
Creatinine, Ser: 1.36 mg/dL — ABNORMAL HIGH (ref 0.61–1.24)
GFR, Estimated: 52 mL/min — ABNORMAL LOW (ref >60)
Glucose, Bld: 154 mg/dL — ABNORMAL HIGH (ref 70–99)
Phosphorus: 3.2 mg/dL (ref 2.5–4.6)
Potassium: 4 mmol/L (ref 3.5–5.1)
Sodium: 132 mmol/L — ABNORMAL LOW (ref 135–145)

## 2023-01-02 LAB — PHOSPHORUS: Phosphorus: 3.6 mg/dL (ref 2.5–4.6)

## 2023-01-02 MED ORDER — ROCURONIUM BROMIDE 10 MG/ML (PF) SYRINGE
PREFILLED_SYRINGE | INTRAVENOUS | Status: AC
  Filled 2023-01-02: qty 10

## 2023-01-02 MED ORDER — MIDODRINE HCL 5 MG PO TABS: 10.0000 mg | ORAL_TABLET | Freq: Three times a day (TID) | ORAL | Status: DC

## 2023-01-02 MED ORDER — OXYCODONE HCL 5 MG PO TABS
5.0000 mg | ORAL_TABLET | Freq: Three times a day (TID) | ORAL | Status: DC
  Administered 2023-01-02 – 2023-01-03 (×4): 5 mg
  Filled 2023-01-02 (×4): qty 1

## 2023-01-02 MED ORDER — RENA-VITE PO TABS
1.0000 | ORAL_TABLET | Freq: Every day | ORAL | Status: DC
Start: 2023-01-02 — End: 2023-01-04
  Administered 2023-01-02: 1
  Filled 2023-01-02: qty 1

## 2023-01-02 MED ORDER — FAT EMUL FISH OIL/PLANT BASED 20% (SMOFLIPID)IV EMUL
250.0000 mL | INTRAVENOUS | Status: AC
Start: 2023-01-02 — End: 2023-01-03
  Administered 2023-01-02: 250 mL via INTRAVENOUS
  Filled 2023-01-02: qty 250

## 2023-01-02 MED ORDER — FENTANYL CITRATE PF 50 MCG/ML IJ SOSY
PREFILLED_SYRINGE | INTRAMUSCULAR | Status: AC
  Filled 2023-01-02: qty 2

## 2023-01-02 MED ORDER — MIDAZOLAM HCL 2 MG/2ML IJ SOLN
INTRAMUSCULAR | Status: AC
  Filled 2023-01-02: qty 2

## 2023-01-02 MED ORDER — TRACE MINERALS CU-MN-SE-ZN 300-55-60-3000 MCG/ML IV SOLN
INTRAVENOUS | Status: AC
Start: 2023-01-02 — End: 2023-01-03
  Filled 2023-01-02: qty 2000

## 2023-01-02 MED ORDER — ETOMIDATE 2 MG/ML IV SOLN
INTRAVENOUS | Status: AC
  Filled 2023-01-02: qty 20

## 2023-01-02 MED ORDER — GABAPENTIN 250 MG/5ML PO SOLN
100.0000 mg | Freq: Three times a day (TID) | ORAL | Status: DC
  Administered 2023-01-02 – 2023-01-03 (×4): 100 mg
  Filled 2023-01-02 (×11): qty 2

## 2023-01-02 MED ORDER — MIDODRINE HCL 5 MG PO TABS
10.0000 mg | ORAL_TABLET | Freq: Three times a day (TID) | ORAL | Status: DC
Start: 2023-01-02 — End: 2023-01-03
  Administered 2023-01-02 – 2023-01-03 (×4): 10 mg
  Filled 2023-01-02 (×4): qty 2

## 2023-01-02 MED ORDER — PROSOURCE TF20 ENFIT COMPATIBL EN LIQD
60.0000 mL | Freq: Four times a day (QID) | ENTERAL | Status: DC
  Administered 2023-01-02 – 2023-01-09 (×23): 60 mL
  Filled 2023-01-02 (×25): qty 60

## 2023-01-02 NOTE — Progress Notes (Signed)
Pt transported to 80m08 from 2H19 w/o event.

## 2023-01-02 NOTE — Progress Notes (Signed)
Patient ID: Peter Oneill, male   DOB: November 09, 1940, 82 y.o.   MRN: [id number] [facility] Progress Note   Assessment/ Plan:   1.  Acute mesenteric ischemia with ischemic gut injury: Status post exploratory laparotomy with large bowel resection and ileostomy along with SMA angioplasty.  Ongoing management per surgery and vascular surgery-remains on heparin infusion for SMA stent along with TPN for parenteral nutrition. 2. ESRD: Previously was on hemodialysis on a TTS schedule and has been on CRRT since 11/1 in the setting of hemodynamic instability/critical illness and large volume requirements; will continue current prescription and maintain him on a net even fluid balance. 3. Anemia: Fluctuating hemoglobin/hematocrit postoperatively, will monitor trend and continue ESA. 4. CKD-MBD: Low phosphorus level secondary to ongoing CRRT losses/limited intake.  Ongoing IV replacement, will discuss with pharmacy regarding increasing phosphorus in TPN. 5. Nutrition: On TPN. 6. Hypotension: Pressors being weaned off as he continues to tolerate net even fluid balance on CRRT.  Subjective:   CRRT filter clotted once overnight.  No acute events otherwise reported by nursing staff.   Objective:   BP (!) 109/36   Pulse 71   Temp 98.3 F (36.8 C) (Axillary)   Resp 16   Ht 5\' 9"  (1.753 m)   Wt 82.5 kg   SpO2 100%   BMI 26.86 kg/m   Physical Exam: Gen: Intubated, briefly opens his eyes upon calling his name CVS: Pulse regular rhythm, normal rate, S1 and S2 normal Resp: Anteriorly clear to auscultation, no distinct rales or rhonchi Abd: Status post exploratory laparotomy with RLQ ostomy stoma.  Surgical dressings intact Ext: Trace ankle edema.  Left femoral dialysis catheter connected to CRRT.  Left upper arm AV fistula with palpable thrill  Labs: BMET Recent Labs  Lab 12/30/22 0545 12/30/22 1520 12/31/22 0342 12/31/22 1600 01/01/23 0437 01/01/23 1509 01/02/23 0419  NA 134* 131*  131* 133* 131* 133* 132*  K 4.0 3.6 3.6 4.1 4.0 3.8 3.9  CL 103 101 100 102 101 102 98  CO2 25 26 25 25 23 26 24   GLUCOSE 71 151* 162* 134* 151* 152* 167*  BUN 25* 29* 32* 32* 32* 33* 36*  CREATININE 1.49* 1.52* 1.40* 1.26* 1.29* 1.23 1.24  CALCIUM 8.2* 7.8* 8.0* 8.1* 8.3* 8.1* 8.0*  PHOS 1.3* 2.0* 1.8* 3.1 1.9* 6.1* 3.6   CBC Recent Labs  Lab 12/26/22 2001 12/26/22 2007 12/28/22 1646 12/29/22 0352 12/31/22 0342 12/31/22 1600 01/01/23 0437 01/02/23 0419  WBC 12.7*   < > 11.2*   < > 7.0 5.6 5.5 6.6  NEUTROABS 10.7*  --  9.6*  --   --   --   --   --   HGB 9.6*   < > 8.7*   < > 7.8* 7.3* 7.7* 7.6*  HCT 26.7*   < > 26.0*   < > 24.5* 23.3* 24.0* 24.4*  MCV 87.8   < > 93.2   < > 96.8 97.1 98.0 100.0  PLT 117*   < > 142*   < > 154 149* 158 177   < > = values in this interval not displayed.      Medications:     arformoterol  15 mcg Nebulization BID   aspirin  81 mg Per Tube Daily   Chlorhexidine Gluconate Cloth  6 each Topical Daily   clopidogrel  75 mg Per Tube Daily   darbepoetin (ARANESP) injection - DIALYSIS  40 mcg Subcutaneous Q Mon-1800   doxercalciferol  4 mcg Intravenous  Q M,W,F   etomidate       gabapentin  100 mg Per Tube Q8H   heparin injection (subcutaneous)  5,000 Units Subcutaneous Q8H   insulin aspart  0-15 Units Subcutaneous Q4H   latanoprost  1 drop Both Eyes QHS   midodrine  10 mg Per Tube Q8H   mouth rinse  15 mL Mouth Rinse Q2H   oxyCODONE  5 mg Per Tube Q8H   revefenacin  175 mcg Nebulization Daily   rosuvastatin  10 mg Per Tube Daily   sodium chloride flush  10-40 mL Intracatheter Q12H   Zetta Bills, MD 01/02/2023, 8:52 AM

## 2023-01-02 NOTE — Progress Notes (Signed)
NAME:  Peter Oneill, MRN:  [id number], DOB:  January 10, 1941, LOS: 8 ADMISSION DATE:  12/25/2022, CONSULTATION DATE:  12/25/2022 REFERRING MD:  EDP, CHIEF COMPLAINT:  Abdominal Pain   History of Present Illness:  82 year old man with PMHx significant for ESRD, Asbestosis, CAD s/p CABG, HTN, HL, OSA and emphysema followed by Dr. Isaiah Oneill who presented to the ED with acutely worsening abdominal pain that began the day of admission.  He underwent dialysis in the morning and then felt short of breath followed by abdominal discomfort.  His initial oxygen saturation was 70% on EMS arrival and his work-up in the ED included a CT Abd/pelvis concerning for acute mesenteric ischemia secondary to occlusion of the celiac artery and significant stenosis of the superior mesenteric artery and small inferior mesenteric artery.  His labs showed a lactic acid of 1.4, WBC 6.3, creatinine 3.7, BNP >1300, normal Hgb.  He was taken emergently to the OR for urgent revascularization and SMA stenting along with ex-lap.  PCCM asked to admit  Pertinent Medical History:   Past Medical History:  Diagnosis Date   Asbestosis (HCC)    Contraindication to percutaneous coronary intervention (PCI)    Coronary artery disease    Dyslipidemia    Dyspnea    ESRD on hemodialysis (HCC)    History of anemia    Hypercholesterolemia    Hypertension    Ileus (HCC)    Pleural effusion on left    Pneumonia    Sleep apnea    Significant Hospital Events: Including procedures, antibiotic start and stop dates in addition to other pertinent events   10/31 Presented with mesenteric ischemia, occlusion of the celiac and significant stenosis of the SMA and small inferior mesenteric artery. Went to OR for urgent revascularization and SMA stenting along with ex-lap. Right hemicolectomy for necrotic cecum. Significant pressor requirement and bleeding (800 cc 1st hour) from abdominal wound vac post-op, given 4L PRBC's, 500cc albumin, 500cc NS and  was on Levophed , Neo , Vaso 0.04 and Epi . Pressors were titrated down with resuscitation.  11/1. CVL placed for pressors. LE warm . Weaning pressors. Orthostatic w/ positional change. 2.2 liters positive. Changing prop to dex. Drain output decreased  11/2 Back to OR for washout, end ileostomy, and closure 11/4 Pressors weaning. SBT  11/6 starting nepro  11/7 increasing TF from 20 to 40, hoping to pull some volume on CRRT prior to considering extubation. Completed zosyn  11/8 cuff leak, adv ETT 1cm. Remains on pressors and CRRT. Adding enteral pain meds to try to reduce continuous sedation burden    Interim History / Subjective:  cuff leak this morning, advanced ETT  H/H stable from yesterday -- hgb 7.6 no leukocytosis. Remains afebrile   Still on good amount of pressors.. 12 NE 0.04 vaso  Objective:  Blood pressure (!) 113/37, pulse 74, temperature 98.3 F (36.8 C), temperature source Axillary, resp. rate (!) 23, height 5\' 9"  (1.753 m), weight 82.5 kg, SpO2 100%. CVP:  [1 mmHg-41 mmHg] 1 mmHg  Vent Mode: PRVC FiO2 (%):  [40 %] 40 % Set Rate:  [14 bmp] 14 bmp Vt Set:  [490 mL] 490 mL PEEP:  [5 cmH20] 5 cmH20 Pressure Support:  [15 cmH20] 15 cmH20 Plateau Pressure:  [21 cmH20-26 cmH20] 26 cmH20   Intake/Output Summary (Last 24 hours) at 01/02/2023 0932 Last data filed at 01/02/2023 0926 Gross per 24 hour  Intake 4299.16 ml  Output 4516.2 ml  Net -217.04 ml   [employer]  12/31/22 0500 01/01/23 0300 01/02/23 0500  Weight: 83.2 kg 83.8 kg 82.5 kg   Physical Examination: General: critically ill elderly M intubated lightly sedated  Neuro: Lightly sedated, following simple commands intermittently, generalized weakness  HEENT: ETT secure but with intermittent positional leak. Cardiovascular: rr cap refill < 3 sec  Lungs: Mechanically ventilated. Intermittent tachypnea  Abdomen: a bit distended, more firm than prior but still soft. Ostomy w liquid ouput. + bowel  sounds   MSK: no acute joint deformity, no cyanosis or clubbing   Skin:pale c/d/w   Assessment & Plan:   Acute resp failure w hypercarbia and hypoxia Hx emphysema Hx asbestosis  Hx OSA P -cont MV support, VAP, pulm hygiene -has intermittently tolerated brief PSV runs but nothing sustainable. Ideally would like to have more volume off and move the needle on pressor req a bit more before we extubate, harder limiting factor is splinting from abd pain causing tachypnea/shallow resp -realistically think he may end up needing trach if there isnt significant progress soon  - Bronchodilators  - adding enteral analgesics to hopefully decr continuous fent gtt need -- RASS goal 0 / -1   Acute mesenteric ischemia Ischemic bowel  Post-op pain  -s/p ex lap x2, resection of lg bowel w ileostomy and angioplasty of SMA and closure  -completed zosyn 11/7 P - Continue ASA, plavix, statin - post op per CCS and VVS  - cont EN at 40/hr  + TPN. Following abd exam closely, agree w CCS-- if distension improves or doesn't worsen, adv EN  - WOC for ileostomy -as above adding enteral analgesia (oxy and gabapentin) per tube   Shock, mixed state (at some point was septic, vasoplegic, hypovolemic. Prior hypovolemia improved. Sepsis resolved though possible that we have a residual sirs response ongoing.) -completed zosyn 11/7 P - NE, vaso for MAP > 65  -adding midodrine  -follow wbc/fever curve  -if we can't make pressor headway, can consider a unit of PRBC-- he is low but acceptable at present  -will send a coox but would be surprised if this were truly cardiogenic in nature.   ABLA + anemia of critical illness and chronic dz  P - Follow CBC - hgb goal >7 -- but as above if we are failing to see pressor improvements could consider transfusing.   ESRD on HD  Hyponatremia, hypervolemic  P - CRRT per nephro -hoping we can pull fluid soon  pAFib, rate controlled  P - On hep gtt - optimize lytes    Moderate malnutrition - TPN per Pharmacy. - Continue EN at 40/hr for now. Adv per  CCS recs  GOC -I spoke with Peter Oneill 11/8 re Peter Oneill's current progress. She shares her concern re not yet being extubated. We talked about this, what we are doing & the obstacles in place. We talked briefly about the utility of a trach in some situations w a prolonged vent wean-- I think its plausible that Lorelle Gibbs may end up being recommended for a trach if we aren't able to make significant vent progress in the coming days  Best Practice: (right click and "Reselect all SmartList Selections" daily)   Diet/type: NPO - TPN and TF DVT prophylaxis: SQ heparin  GI prophylaxis: H2B Lines: Central line and Dialysis Catheter Foley:  N/A Code Status:  full code Last date of multidisciplinary goals of care discussion: 11/8  CRITICAL CARE Performed by: Lanier Clam   Total critical care time: 48 minutes  Critical care time was exclusive  of separately billable procedures and treating other patients. Critical care was necessary to treat or prevent imminent or life-threatening deterioration.  Critical care was time spent personally by me on the following activities: development of treatment plan with patient and/or surrogate as well as nursing, discussions with consultants, evaluation of patient's response to treatment, examination of patient, obtaining history from patient or surrogate, ordering and performing treatments and interventions, ordering and review of laboratory studies, ordering and review of radiographic studies, pulse oximetry and re-evaluation of patient's condition.  Tessie Fass MSN, AGACNP-BC [facility] Pulmonary/Critical Care Medicine Amion for pager 01/02/2023, 9:32 AM

## 2023-01-02 NOTE — Progress Notes (Signed)
Patient ID: Maximiliano Muramoto, male   DOB: 03-27-1940, 82 y.o.   MRN: [id number] 6 Days Post-Op    Interval: 90 cc recorded of ileostomy output yesterday, but appliance filled with liquid stool this AM. Still on vaso and levo. Abdomen slightly distended this AM but soft.  ROS negative except as listed above. Objective: Vital signs in last 24 hours: Temp:  [98 F (36.7 C)-98.7 F (37.1 C)] 98.3 F (36.8 C) (11/08 0335) Pulse Rate:  [54-84] 84 (11/08 0740) Resp:  [15-40] 38 (11/08 0740) BP: (94-148)/(28-87) 115/35 (11/08 0700) SpO2:  [98 %-100 %] 100 % (11/08 0750) FiO2 (%):  [40 %] 40 % (11/08 0750) Weight:  [82.5 kg] 82.5 kg (11/08 0500) Last BM Date : 01/01/23  Intake/Output from previous day: 11/07 0701 - 11/08 0700 In: 4238.4 [I.V.:2885.4; NG/GT:941.8; IV Piggyback:411.2] Out: 4383.3 [Drains:55; Stool:90] Intake/Output this shift: No intake/output data recorded.  General appearance: alert GI: soft, wound OK, ileostomy mucosa still pale and dusky but having good stool output, ostomy appliance filled with liquid stool . Abdomen soft but more distended than previously documented.  Lab Results: I have personally reviewed all labs for past 24h.  Studies/Results: No results found.   Assessment/Plan: Acute mesenteric ischemia -  -s/p exlap, R hemicolectomy by Dr. Azucena Cecil 11/1 just after midnight. Left in discontinuity with abthera. Also underwent revascularization with angioplasty of SMA by Dr. Lenell Antu.  -S/p washout, end ileostomy, JP drain placement and abdominal closure 11/2 -tolerated Vital at 40cc/h yesterday. Given new distension would hold at 40. If distension resolves or if remains stable today then we can advance TF further  FEN - OK for meds per tube, continue TNA DVT - SCDs, heparin gtt as above Dispo - ICU   I discussed with CCM team  LOS: 8 days    Donata Duff, MD [facility]  01/02/2023

## 2023-01-02 NOTE — Progress Notes (Signed)
Nutrition Follow-up  DOCUMENTATION CODES:   Non-severe (moderate) malnutrition in context of chronic illness  INTERVENTION:   Recommend Cortrak placement at some point; discussed with PCCM. Plan to hold on Cortrak now and continue NG given abd distention amd possible need for decompression. Tentative plan for Cortrak next week.   Discussed TPN with Pharmacist, MD. Plan to decrease TPN by half today but continuing some form of TPN given increasing abd distention today. If able to titrate TF to goal rate, ok to discontinue TPN from RD perspective  Tube Feeding via Cortrak:  Per MDs, plan to hold TF at 40 ml/hr today. If abd distention improves/doesn't worsen, plan to advance EN.  TF Goal Regimen:  Vital 1.5 at 50 ml/hr with Pro-Source TF20 60 mL QID TF at goal provides 2120 kcals, 161 g of protein and 912 mL of free water  Recommend continuing to monitor phosphorus closely while on CRRT; recommend considering daily supplementation of phosphorus while on CRRT, favor enteral route if able for continued maintenance  Add Renal MVI daily  NUTRITION DIAGNOSIS:   Moderate Malnutrition related to chronic illness (ESRD on HD) as evidenced by mild muscle depletion, mild fat depletion.  Being addressed  GOAL:   Patient will meet greater than or equal to 90% of their needs  Progressing  MONITOR:   Vent status, Labs, Weight trends, TF tolerance, Skin, I & O's (TPN)  REASON FOR ASSESSMENT:   Consult New TPN/TNA  ASSESSMENT:   82 yo male admitted with acute mesenteric ischemia. PMH includes HTN, HLD, CAD, CABG, sleep apnea, ESRD on HD, asbestos exposure.  Pt remains on vent support, CRRT-current UF of 100 ml/hr Levophed up to 12 Vasopressin 0.04  TPN at 82 ml/hr (Clinimix 8%AA/10%Dextrose)  Vital 1.5 at 40 ml/hr; abdomen more distended but soft. +stool output via ileostomy. On exam, noted some fluid around abdomen as well which may be contributing to "distention"  +ostomy  output  Weight down to 82.5 kg; weight 89.2 kg on 11/02 Outpatient EDW: 79.6   Labs: phosphorus 3.6 (wdl), sodium 132 (L) Meds: reviewed  Diet Order:   Diet Order             Diet NPO time specified  Diet effective now                   EDUCATION NEEDS:   Not appropriate for education at this time  Skin:  Skin Assessment: Skin Integrity Issues: Skin Integrity Issues:: DTI DTI: sacrococcygeal area (present x 1 year per pt) Wound Vac: abdominal incision Other: L ear laceration  Last BM:  11/08 +stool via ileostomy  Height:   Ht Readings from Last 1 Encounters:  12/25/22 5\' 9"  (1.753 m)    Weight:   Wt Readings from Last 1 Encounters:  01/02/23 82.5 kg    Ideal Body Weight:  72.7 kg  BMI:  Body mass index is 26.86 kg/m.  Estimated Nutritional Needs:   Kcal:  2100-2400 kcals  Protein:  145-180 g  Fluid:  1 L + UOP   Romelle Starcher MS, RDN, LDN, CNSC Registered Dietitian 3 Clinical Nutrition RD Pager and On-Call Pager Number Located in [place]

## 2023-01-02 NOTE — Progress Notes (Signed)
PHARMACY - TOTAL PARENTERAL NUTRITION CONSULT NOTE   Indication:  bowel discontinuity  Patient Measurements: Height: 5\' 9"  (175.3 cm) Weight: 82.5 kg (181 lb 14.1 oz) IBW/kg (Calculated) : 70.7 TPN AdjBW (KG): 81.2 Body mass index is 26.86 kg/m.  Assessment: 82 years of age male with moderate malnutrition at baseline due to chronic illness including ESRD on HD who presented with acute mesenteric ischemia s/p ex lap with R hemicolectomy, angioplasty and stenting of SMA, wound VAC placement, and bowel left in discontinuity on 11/1. Pharmacy consulted for TPN.   Pt returned to OR on 11/2 for washout with end ileostomy, JP drain placement, and abdominal wall closure 11/2. Patient was initiated on TF on 11/6 and is advancing. Minimal stool output and abdomen soft, but slightly distended, per MD on 11/8, so TF to remain at 39ml/hr for another day.   Glucose / Insulin: CBGs <180. 11 units SSI/24hrs Electrolytes: Na 132, K 3.9, Phos 3.6 (s/p NaPhos + 12.82mmol from ~10hrs of TPN), Mg 2.2, ~CoCa 9.6. Others wnl Renal: BUN 36; ESRD on HD TTS PTA, last HD 10/31, now requiring CRRT 11/1 >>  Hepatic: AST/ALT/T bili WNL. AlkPhos up 129. Albumin 2. TG 119 up (11/4) Intake / Output; MIVF: No UOP. CRRT removal 4238 mL. NGT output 0 mL charted. JP driain output 55 mL. Ileostomy output 90 mL. Net +5.2 L  GI Imaging: None since start of TPN GI Surgeries / Procedures:  None since start of TPN  Central access: CVC (double lumen) placed 11/1 - RN to dedicate lumen for TPN. TPN start date: 12/28/22  Nutritional Goals: Clinimix 8/10 2 L with NaPhos 40 meq (x 7 days @ 82 mL/hr) - SMOFlipid 250 mL daily Will provide 100% AA + 83% kcal needs Monitor and consider if should be exception for compounded TPN to help with volume.   RD Assessment: Estimated Needs Total Energy Estimated Needs: 2100-2400 kcals Total Protein Estimated Needs: 145-180 g Total Fluid Estimated Needs: 1 L + UOP  Current  Nutrition:  TPN TF (64mL/hr, goal 50 mL/hr)  Plan:  Decrease Clinimix 8/10 at 1800 to 80ml/hr  - 1 L without lytes with NaPhos added (total 104meq/15mmol/24hr) while on CRRT (a 2L bag of Clinimix will be prepared with NaPhos, however only 1 L will be adminstered, resulting in contents listed above actually given to pt) - SMOF lipid 250 mL  - TPN will provide ~55% AA and 55% of kcal needs. Pt is receiving TF at 26ml/hr, 80% of goal. Patient will meet >100% protein and calorie goals with reduced TPN and TF at 48ml/hr. Continue standard MVI and trace elements to TPN Continue Moderate q4h SSI and adjust as needed  Monitor TPN labs daily while on Clinimix, TGs qMon  F/u ability to advance TF to goal and ability to wean TPN, per RD and Gen Surg recs  Thank you for allowing pharmacy to be a part of this patient's care.  Wilburn Cornelia, PharmD, BCPS Clinical Pharmacist 01/02/2023 10:55 AM   Please refer to AMION for pharmacy phone number

## 2023-01-03 ENCOUNTER — Inpatient Hospital Stay (HOSPITAL_COMMUNITY): Payer: Medicare HMO

## 2023-01-03 DIAGNOSIS — J9601 Acute respiratory failure with hypoxia: Secondary | ICD-10-CM | POA: Diagnosis not present

## 2023-01-03 DIAGNOSIS — R578 Other shock: Secondary | ICD-10-CM | POA: Diagnosis not present

## 2023-01-03 LAB — PHOSPHORUS: Phosphorus: 2.8 mg/dL (ref 2.5–4.6)

## 2023-01-03 LAB — POCT I-STAT 7, (LYTES, BLD GAS, ICA,H+H)
Acid-base deficit: 3 mmol/L — ABNORMAL HIGH (ref 0.0–2.0)
Acid-base deficit: 3 mmol/L — ABNORMAL HIGH (ref 0.0–2.0)
Acid-base deficit: 1 mmol/L (ref 0.0–2.0)
Bicarbonate: 24.6 mmol/L (ref 20.0–28.0)
Bicarbonate: 23 mmol/L (ref 20.0–28.0)
Bicarbonate: 24.3 mmol/L (ref 20.0–28.0)
Calcium, Ion: 1.26 mmol/L (ref 1.15–1.40)
Calcium, Ion: 1.21 mmol/L (ref 1.15–1.40)
Calcium, Ion: 1.21 mmol/L (ref 1.15–1.40)
HCT: 24 % — ABNORMAL LOW (ref 39.0–52.0)
HCT: 22 % — ABNORMAL LOW (ref 39.0–52.0)
HCT: 24 % — ABNORMAL LOW (ref 39.0–52.0)
Hemoglobin: 8.2 g/dL — ABNORMAL LOW (ref 13.0–17.0)
Hemoglobin: 7.5 g/dL — ABNORMAL LOW (ref 13.0–17.0)
Hemoglobin: 8.2 g/dL — ABNORMAL LOW (ref 13.0–17.0)
O2 Saturation: 99 %
O2 Saturation: 99 %
O2 Saturation: 95 %
Patient temperature: 98.2
Patient temperature: 98.2
Patient temperature: 98.5
Potassium: 4.5 mmol/L (ref 3.5–5.1)
Potassium: 4.5 mmol/L (ref 3.5–5.1)
Potassium: 4.6 mmol/L (ref 3.5–5.1)
Sodium: 132 mmol/L — ABNORMAL LOW (ref 135–145)
Sodium: 132 mmol/L — ABNORMAL LOW (ref 135–145)
Sodium: 132 mmol/L — ABNORMAL LOW (ref 135–145)
TCO2: 26 mmol/L (ref 22–32)
TCO2: 24 mmol/L (ref 22–32)
TCO2: 26 mmol/L (ref 22–32)
pCO2 arterial: 58.5 mm[Hg] — ABNORMAL HIGH (ref 32–48)
pCO2 arterial: 47.4 mm[Hg] (ref 32–48)
pCO2 arterial: 41.5 mm[Hg] (ref 32–48)
pH, Arterial: 7.231 — ABNORMAL LOW (ref 7.35–7.45)
pH, Arterial: 7.293 — ABNORMAL LOW (ref 7.35–7.45)
pH, Arterial: 7.376 (ref 7.35–7.45)
pO2, Arterial: 155 mm[Hg] — ABNORMAL HIGH (ref 83–108)
pO2, Arterial: 181 mm[Hg] — ABNORMAL HIGH (ref 83–108)
pO2, Arterial: 77 mm[Hg] — ABNORMAL LOW (ref 83–108)

## 2023-01-03 LAB — ECHOCARDIOGRAM COMPLETE
AR max vel: 1.73 cm2
AV Mean grad: 10.7 mm[Hg]
AV Peak grad: 21.5 mm[Hg]
Ao pk vel: 2.32 m/s
Area-P 1/2: 3.6 cm2
Calc EF: 60.2 %
Height: 69 in
S' Lateral: 3.7 cm
Single Plane A2C EF: 60.4 %
Single Plane A4C EF: 60.7 %
Weight: 2878.33 [oz_av]

## 2023-01-03 LAB — GLUCOSE, CAPILLARY
Glucose-Capillary: 131 mg/dL — ABNORMAL HIGH (ref 70–99)
Glucose-Capillary: 175 mg/dL — ABNORMAL HIGH (ref 70–99)
Glucose-Capillary: 148 mg/dL — ABNORMAL HIGH (ref 70–99)
Glucose-Capillary: 104 mg/dL — ABNORMAL HIGH (ref 70–99)
Glucose-Capillary: 161 mg/dL — ABNORMAL HIGH (ref 70–99)

## 2023-01-03 LAB — CBC
HCT: 23.2 % — ABNORMAL LOW (ref 39.0–52.0)
HCT: 24.3 % — ABNORMAL LOW (ref 39.0–52.0)
Hemoglobin: 7.4 g/dL — ABNORMAL LOW (ref 13.0–17.0)
Hemoglobin: 7.5 g/dL — ABNORMAL LOW (ref 13.0–17.0)
MCH: 31.9 pg (ref 26.0–34.0)
MCH: 31.4 pg (ref 26.0–34.0)
MCHC: 31.9 g/dL (ref 30.0–36.0)
MCHC: 30.9 g/dL (ref 30.0–36.0)
MCV: 100 fL (ref 80.0–100.0)
MCV: 101.7 fL — ABNORMAL HIGH (ref 80.0–100.0)
Platelets: 192 10*3/uL (ref 150–400)
Platelets: 188 10*3/uL (ref 150–400)
RBC: 2.32 MIL/uL — ABNORMAL LOW (ref 4.22–5.81)
RBC: 2.39 MIL/uL — ABNORMAL LOW (ref 4.22–5.81)
RDW: 17.5 % — ABNORMAL HIGH (ref 11.5–15.5)
RDW: 17.7 % — ABNORMAL HIGH (ref 11.5–15.5)
WBC: 12.2 10*3/uL — ABNORMAL HIGH (ref 4.0–10.5)
WBC: 12.7 10*3/uL — ABNORMAL HIGH (ref 4.0–10.5)
nRBC: 6.1 % — ABNORMAL HIGH (ref 0.0–0.2)
nRBC: 6.7 % — ABNORMAL HIGH (ref 0.0–0.2)

## 2023-01-03 LAB — COMPREHENSIVE METABOLIC PANEL
ALT: 26 U/L (ref 0–44)
AST: 26 U/L (ref 15–41)
Albumin: 2 g/dL — ABNORMAL LOW (ref 3.5–5.0)
Alkaline Phosphatase: 153 U/L — ABNORMAL HIGH (ref 38–126)
Anion gap: 6 (ref 5–15)
BUN: 39 mg/dL — ABNORMAL HIGH (ref 8–23)
CO2: 24 mmol/L (ref 22–32)
Calcium: 8.3 mg/dL — ABNORMAL LOW (ref 8.9–10.3)
Chloride: 102 mmol/L (ref 98–111)
Creatinine, Ser: 1.29 mg/dL — ABNORMAL HIGH (ref 0.61–1.24)
GFR, Estimated: 56 mL/min — ABNORMAL LOW (ref >60)
Glucose, Bld: 145 mg/dL — ABNORMAL HIGH (ref 70–99)
Potassium: 4.3 mmol/L (ref 3.5–5.1)
Sodium: 132 mmol/L — ABNORMAL LOW (ref 135–145)
Total Bilirubin: 1.4 mg/dL — ABNORMAL HIGH (ref <1.2)
Total Protein: 5.8 g/dL — ABNORMAL LOW (ref 6.5–8.1)

## 2023-01-03 LAB — RENAL FUNCTION PANEL
Albumin: 2 g/dL — ABNORMAL LOW (ref 3.5–5.0)
Anion gap: 10 (ref 5–15)
BUN: 42 mg/dL — ABNORMAL HIGH (ref 8–23)
CO2: 21 mmol/L — ABNORMAL LOW (ref 22–32)
Calcium: 8.4 mg/dL — ABNORMAL LOW (ref 8.9–10.3)
Chloride: 100 mmol/L (ref 98–111)
Creatinine, Ser: 1.42 mg/dL — ABNORMAL HIGH (ref 0.61–1.24)
GFR, Estimated: 50 mL/min — ABNORMAL LOW (ref >60)
Glucose, Bld: 150 mg/dL — ABNORMAL HIGH (ref 70–99)
Phosphorus: 2.6 mg/dL (ref 2.5–4.6)
Potassium: 4.4 mmol/L (ref 3.5–5.1)
Sodium: 131 mmol/L — ABNORMAL LOW (ref 135–145)

## 2023-01-03 LAB — LACTIC ACID, PLASMA
Lactic Acid, Venous: 0.5 mmol/L (ref 0.5–1.9)
Lactic Acid, Venous: 0.8 mmol/L (ref 0.5–1.9)

## 2023-01-03 LAB — HEPARIN LEVEL (UNFRACTIONATED): Heparin Unfractionated: 0.24 [IU]/mL — ABNORMAL LOW (ref 0.30–0.70)

## 2023-01-03 LAB — MAGNESIUM: Magnesium: 2.5 mg/dL — ABNORMAL HIGH (ref 1.7–2.4)

## 2023-01-03 MED ORDER — FAT EMUL FISH OIL/PLANT BASED 20% (SMOFLIPID)IV EMUL
250.0000 mL | INTRAVENOUS | Status: AC
Start: 2023-01-03 — End: 2023-01-04
  Administered 2023-01-03: 250 mL via INTRAVENOUS
  Filled 2023-01-03: qty 250

## 2023-01-03 MED ORDER — HEPARIN (PORCINE) 25000 UT/250ML-% IV SOLN
1250.0000 [IU]/h | INTRAVENOUS | Status: DC
  Administered 2023-01-03: 1100 [IU]/h via INTRAVENOUS
  Administered 2023-01-04: 1250 [IU]/h via INTRAVENOUS
  Filled 2023-01-03 (×3): qty 250

## 2023-01-03 MED ORDER — METOCLOPRAMIDE HCL 5 MG/ML IJ SOLN
5.0000 mg | Freq: Three times a day (TID) | INTRAMUSCULAR | Status: DC
  Administered 2023-01-03 – 2023-01-10 (×21): 5 mg via INTRAVENOUS
  Filled 2023-01-03 (×21): qty 2

## 2023-01-03 MED ORDER — SODIUM CHLORIDE 0.9 % IV SOLN: INTRAVENOUS | Status: AC | PRN

## 2023-01-03 MED ORDER — TRACE MINERALS CU-MN-SE-ZN 300-55-60-3000 MCG/ML IV SOLN
INTRAVENOUS | Status: AC
Start: 2023-01-03 — End: 2023-01-04
  Filled 2023-01-03: qty 1000

## 2023-01-03 MED ORDER — MIDODRINE HCL 5 MG PO TABS
15.0000 mg | ORAL_TABLET | Freq: Three times a day (TID) | ORAL | Status: DC
  Administered 2023-01-05 – 2023-01-10 (×14): 15 mg
  Filled 2023-01-03 (×17): qty 3

## 2023-01-03 MED ORDER — FENTANYL CITRATE PF 50 MCG/ML IJ SOSY
50.0000 ug | PREFILLED_SYRINGE | INTRAMUSCULAR | Status: DC | PRN
  Administered 2023-01-03 – 2023-01-09 (×11): 50 ug via INTRAVENOUS
  Filled 2023-01-03 (×11): qty 1

## 2023-01-03 NOTE — Progress Notes (Signed)
PHARMACY - ANTICOAGULATION CONSULT NOTE  Pharmacy Consult for Atrial Fibrillation  Indication: atrial fibrillation  Allergies  Allergen Reactions   Atorvastatin Other (See Comments)    Muscle aches    Patient Measurements: Height: 5\' 9"  (175.3 cm) Weight: 81.6 kg (179 lb 14.3 oz) IBW/kg (Calculated) : 70.7  Vital Signs: Temp: 97 F (36.1 C) (11/09 1945) Temp Source: Axillary (11/09 1500) BP: 134/52 (11/09 2000) Pulse Rate: 90 (11/09 2000)  Labs: Recent Labs    01/01/23 0437 01/01/23 1509 01/02/23 0419 01/02/23 1609 01/03/23 0350 01/03/23 1010 01/03/23 1031 01/03/23 1240 01/03/23 1600 01/03/23 1710 01/03/23 1959  HGB 7.7*  --  7.6*  --  7.4* 7.5* 8.2* 7.5*  --  8.2*  --   HCT 24.0*  --  24.4*  --  23.2* 24.3* 24.0* 22.0*  --  24.0*  --   PLT 158  --  177  --  192 188  --   --   --   --   --   HEPARINUNFRC 0.23*  --   --   --   --   --   --   --   --   --  0.24*  CREATININE 1.29*   < > 1.24 1.36* 1.29*  --   --   --  1.42*  --   --    < > = values in this interval not displayed.    Estimated Creatinine Clearance: 40.8 mL/min (A) (by C-G formula based on SCr of 1.42 mg/dL (H)).   Medical History: Past Medical History:  Diagnosis Date   Asbestosis (HCC)    Contraindication to percutaneous coronary intervention (PCI)    Coronary artery disease    Dyslipidemia    Dyspnea    ESRD on hemodialysis (HCC)    History of anemia    Hypercholesterolemia    Hypertension    Ileus (HCC)    Pleural effusion on left    Pneumonia    Sleep apnea     Medications:  Scheduled:   arformoterol  15 mcg Nebulization BID   aspirin  81 mg Per Tube Daily   Chlorhexidine Gluconate Cloth  6 each Topical Daily   clopidogrel  75 mg Per Tube Daily   darbepoetin (ARANESP) injection - DIALYSIS  40 mcg Subcutaneous Q Mon-1800   doxercalciferol  4 mcg Intravenous Q M,W,F   feeding supplement (PROSource TF20)  60 mL Per Tube QID   gabapentin  100 mg Per Tube Q8H   insulin aspart   0-15 Units Subcutaneous Q4H   latanoprost  1 drop Both Eyes QHS   metoCLOPramide (REGLAN) injection  5 mg Intravenous Q8H   midodrine  15 mg Per Tube Q8H   multivitamin  1 tablet Per Tube QHS   mouth rinse  15 mL Mouth Rinse Q2H   revefenacin  175 mcg Nebulization Daily   rosuvastatin  10 mg Per Tube Daily   sodium chloride flush  10-40 mL Intracatheter Q12H   Infusions:    prismasol BGK 4/2.5 400 mL/hr at 01/03/23 0809    prismasol BGK 4/2.5 400 mL/hr at 01/03/23 0815   sodium chloride     famotidine (PEPCID) IV Stopped (01/03/23 0301)   TPN (CLINIMIX) Adult with Electrolyte Additives 42 mL/hr at 01/03/23 2000   And   fat emul(SMOFlipid) 20.8 mL/hr at 01/03/23 2000   feeding supplement (VITAL 1.5 CAL) 10 mL/hr at 01/03/23 2000   heparin 1,100 Units/hr (01/03/23 2000)   norepinephrine (LEVOPHED) Adult infusion 17  mcg/min (01/03/23 2000)   prismasol BGK 4/2.5 1,400 mL/hr at 01/03/23 1821   vasopressin 0.04 Units/min (01/03/23 2000)    Assessment: 82 y/o M with mesenteric ischemia s/p OR with vascular and general surgery. S/P angioplasty and stenting of the SMA and right hemi-colectomy.  Resuming heparin infusion for atrial fibrillation on 11/9 after being off since 11/7. Heparin level tonight came back subtherapeutic at 0.24, on 1100 units/hr. No s/sx of bleeding or infusion issues.   Goal of Therapy:  Heparin level 0.3-0.7 units/ml Monitor platelets by anticoagulation protocol: Yes   Plan:  Increase heparin infusion at 1250 units/hr Heparin level in 8 hours  Daily HL, CBC, s/sx of bleeding   Thank you for allowing pharmacy to participate in this patient's care,  Sherron Monday, PharmD, BCCCP Clinical Pharmacist  Phone: [phone] 01/03/2023 9:15 PM  Please check AMION for all [facility] Pharmacy phone numbers After 10:00 PM, call Main Pharmacy [phone]

## 2023-01-03 NOTE — Progress Notes (Signed)
Per MD push MAP goals to 70.

## 2023-01-03 NOTE — Progress Notes (Signed)
Patient ID: Peter Oneill, male   DOB: 04/25/1940, 82 y.o.   MRN: [id number]  [facility] Progress Note   Assessment/ Plan:   1.  Acute mesenteric ischemia with ischemic gut injury: Status post exploratory laparotomy with large bowel resection and ileostomy along with SMA angioplasty.  Ongoing management per surgery and vascular surgery-remains on heparin infusion for SMA stent along with TPN for parenteral nutrition.  Concerns raised with decreased neurological responsiveness. 2. ESRD: Previously was on hemodialysis on a TTS schedule and has been on CRRT since 11/1 in the setting of hemodynamic instability/critical illness and large volume requirements; will continue current prescription and attempt net negative fluid balance. 3. Anemia: Downtrending hemoglobin and hematocrit noted postoperatively which possibly may be due to an occult loss versus dilution with net positive fluid balance.  Additional evaluation per CCM. 4. CKD-MBD: Phosphorus level marginally low but overall improved with addition of phosphorus to TPN 5. Nutrition: On TPN. 6. Hypotension: Remains on pressors with slight increase in requirements overnight.  Subjective:   Slight increase in pressor requirements overnight with concerns of downtrending hemoglobin and lack of responsiveness after decreasing sedation.   Objective:   BP (!) 117/34   Pulse 72   Temp 98.2 F (36.8 C) (Axillary)   Resp 15   Ht 5\' 9"  (1.753 m)   Wt 81.6 kg   SpO2 100%   BMI 26.57 kg/m   Physical Exam: Gen: Intubated, unresponsive off sedation CVS: Pulse regular rhythm, normal rate, S1 and S2 normal Resp: Anteriorly clear to auscultation, no distinct rales or rhonchi Abd: Status post exploratory laparotomy with RLQ ostomy stoma.  Surgical dressings intact Ext: Trace ankle edema.  Left femoral dialysis catheter connected to CRRT.  Left upper arm AV fistula with palpable thrill  Labs: BMET Recent Labs  Lab 12/31/22 0342  12/31/22 1600 01/01/23 0437 01/01/23 1509 01/02/23 0419 01/02/23 1609 01/03/23 0350  NA 131* 133* 131* 133* 132* 132* 132*  K 3.6 4.1 4.0 3.8 3.9 4.0 4.3  CL 100 102 101 102 98 102 102  CO2 25 25 23 26 24 23 24   GLUCOSE 162* 134* 151* 152* 167* 154* 145*  BUN 32* 32* 32* 33* 36* 41* 39*  CREATININE 1.40* 1.26* 1.29* 1.23 1.24 1.36* 1.29*  CALCIUM 8.0* 8.1* 8.3* 8.1* 8.0* 8.0* 8.3*  PHOS 1.8* 3.1 1.9* 6.1* 3.6 3.2 2.8   CBC Recent Labs  Lab 12/28/22 1646 12/29/22 0352 12/31/22 1600 01/01/23 0437 01/02/23 0419 01/03/23 0350  WBC 11.2*   < > 5.6 5.5 6.6 12.2*  NEUTROABS 9.6*  --   --   --   --   --   HGB 8.7*   < > 7.3* 7.7* 7.6* 7.4*  HCT 26.0*   < > 23.3* 24.0* 24.4* 23.2*  MCV 93.2   < > 97.1 98.0 100.0 100.0  PLT 142*   < > 149* 158 177 192   < > = values in this interval not displayed.      Medications:     arformoterol  15 mcg Nebulization BID   aspirin  81 mg Per Tube Daily   Chlorhexidine Gluconate Cloth  6 each Topical Daily   clopidogrel  75 mg Per Tube Daily   darbepoetin (ARANESP) injection - DIALYSIS  40 mcg Subcutaneous Q Mon-1800   doxercalciferol  4 mcg Intravenous Q M,W,F   feeding supplement (PROSource TF20)  60 mL Per Tube QID   gabapentin  100 mg Per Tube Q8H   heparin injection (  subcutaneous)  5,000 Units Subcutaneous Q8H   insulin aspart  0-15 Units Subcutaneous Q4H   latanoprost  1 drop Both Eyes QHS   midodrine  10 mg Per Tube Q8H   multivitamin  1 tablet Per Tube QHS   mouth rinse  15 mL Mouth Rinse Q2H   oxyCODONE  5 mg Per Tube Q8H   revefenacin  175 mcg Nebulization Daily   rosuvastatin  10 mg Per Tube Daily   sodium chloride flush  10-40 mL Intracatheter Q12H   Zetta Bills, MD 01/03/2023, 9:18 AM

## 2023-01-03 NOTE — Progress Notes (Signed)
PHARMACY - ANTICOAGULATION CONSULT NOTE  Pharmacy Consult for Atrial Fibrillation  Indication: atrial fibrillation  Allergies  Allergen Reactions   Atorvastatin Other (See Comments)    Muscle aches    Patient Measurements: Height: 5\' 9"  (175.3 cm) Weight: 81.6 kg (179 lb 14.3 oz) IBW/kg (Calculated) : 70.7  Vital Signs: Temp: 98.2 F (36.8 C) (11/09 1100) Temp Source: Axillary (11/09 1100) BP: 117/44 (11/09 1015) Pulse Rate: 78 (11/09 1045)  Labs: Recent Labs    01/01/23 0437 01/01/23 1509 01/02/23 0419 01/02/23 1609 01/03/23 0350 01/03/23 1010 01/03/23 1031  HGB 7.7*  --  7.6*  --  7.4* 7.5* 8.2*  HCT 24.0*  --  24.4*  --  23.2* 24.3* 24.0*  PLT 158  --  177  --  192 188  --   HEPARINUNFRC 0.23*  --   --   --   --   --   --   CREATININE 1.29*   < > 1.24 1.36* 1.29*  --   --    < > = values in this interval not displayed.    Estimated Creatinine Clearance: 44.9 mL/min (A) (by C-G formula based on SCr of 1.29 mg/dL (H)).   Medical History: Past Medical History:  Diagnosis Date   Asbestosis (HCC)    Contraindication to percutaneous coronary intervention (PCI)    Coronary artery disease    Dyslipidemia    Dyspnea    ESRD on hemodialysis (HCC)    History of anemia    Hypercholesterolemia    Hypertension    Ileus (HCC)    Pleural effusion on left    Pneumonia    Sleep apnea     Medications:  Scheduled:   arformoterol  15 mcg Nebulization BID   aspirin  81 mg Per Tube Daily   Chlorhexidine Gluconate Cloth  6 each Topical Daily   clopidogrel  75 mg Per Tube Daily   darbepoetin (ARANESP) injection - DIALYSIS  40 mcg Subcutaneous Q Mon-1800   doxercalciferol  4 mcg Intravenous Q M,W,F   feeding supplement (PROSource TF20)  60 mL Per Tube QID   gabapentin  100 mg Per Tube Q8H   insulin aspart  0-15 Units Subcutaneous Q4H   latanoprost  1 drop Both Eyes QHS   midodrine  15 mg Per Tube Q8H   multivitamin  1 tablet Per Tube QHS   mouth rinse  15 mL Mouth  Rinse Q2H   revefenacin  175 mcg Nebulization Daily   rosuvastatin  10 mg Per Tube Daily   sodium chloride flush  10-40 mL Intracatheter Q12H   Infusions:    prismasol BGK 4/2.5 400 mL/hr at 01/03/23 0809    prismasol BGK 4/2.5 400 mL/hr at 01/03/23 0815   sodium chloride     famotidine (PEPCID) IV Stopped (01/03/23 0301)   TPN (CLINIMIX) Adult with Electrolyte Additives     And   fat emul(SMOFlipid)     feeding supplement (VITAL 1.5 CAL) 40 mL/hr at 01/03/23 1200   heparin     norepinephrine (LEVOPHED) Adult infusion 22 mcg/min (01/03/23 1200)   prismasol BGK 4/2.5 1,400 mL/hr at 01/03/23 1006   TPN (CLINIMIX) Adult with Electrolyte Additives 42 mL/hr at 01/03/23 1200   vasopressin 0.04 Units/min (01/03/23 1200)    Assessment: 82 y/o M with mesenteric ischemia s/p OR with vascular and general surgery. S/P angioplasty and stenting of the SMA and right hemi-colectomy.  Resuming heparin infusion for atrial fibrillation on 11/9 after being off since 11/7.  He was slightly subtherapeutic on this dose, but given his hemoglobin of 7.5, will start dosing conservatively.   Goal of Therapy:  Heparin level 0.3-0.7 units/ml Monitor platelets by anticoagulation protocol: Yes   Plan:  Resume heparin at 1100 units/hr (prior rate)  Heparin level in 8 hours  Daily HL, CBC   Sanad Fearnow 01/03/2023,11:37 AM

## 2023-01-03 NOTE — Progress Notes (Signed)
Per MD continue CRRT and follow Systolic BP greater than 100

## 2023-01-03 NOTE — Progress Notes (Signed)
Patient ID: Peter Oneill, male   DOB: 30-Nov-1940, 82 y.o.   MRN: [id number] 7 Days Post-Op    Interval: Patient has been off of sedation for 2 hours and is not responding to pain or following commands. He is occasionally opening his eyes. Per RN he was F/C overnight.   On levo and vaso, pressor requirement going up a little today.  Remains on CRRT WBC 12 from 7  ROS negative except as listed above. Objective: Vital signs in last 24 hours: Temp:  [98 F (36.7 C)-98.7 F (37.1 C)] 98.2 F (36.8 C) (11/09 0700) Pulse Rate:  [67-91] 72 (11/09 0800) Resp:  [12-27] 17 (11/09 0800) BP: (95-147)/(31-76) 121/43 (11/09 0800) SpO2:  [100 %] 100 % (11/09 0826) FiO2 (%):  [40 %] 40 % (11/09 0826) Weight:  [81.6 kg] 81.6 kg (11/09 0500) Last BM Date : 01/02/23  Intake/Output from previous day: 11/08 0701 - 11/09 0700 In: 3976 [I.V.:2518.1; NG/GT:1408; IV Piggyback:49.9] Out: 3976 [Drains:70; Stool:767] Intake/Output this shift: Total I/O In: 118.3 [I.V.:78.3; NG/GT:40] Out: 115   General appearance: critically ill appearing  GI: soft, wound OK - pale fascia in tact, ileostomy mucosa still pale and dusky but having good stool output, ostomy appliance filled with semi-solid non-bloody st ool stool. Abdomen soft with mild distention  Ostomy - 767 cc  JP - 70 SS   Lab Results: I have personally reviewed all labs for past 24h.  Studies/Results: No results found.   Assessment/Plan: Acute mesenteric ischemia -  -s/p exlap, R hemicolectomy by Dr. Azucena Cecil 11/1 just after midnight. Left in discontinuity with abthera. Also underwent revascularization with angioplasty of SMA by Dr. Lenell Antu.  -S/p washout, end ileostomy, JP drain placement and abdominal closure 11/2 - POD# 7 -tolerated Vital at 40cc/h yesterday. Given increasing pressor requirement, increasing WBC, and neurologic change, will hold TF at 40. Check a lactate for completion. No clinical evidence of bowel ischemia -- stool is  non-bloody, abdomen is not tense/severely distended.  CXR reviewed briefly -- looks like interval improvement in pulmonary edema/effusions, plaques appear stable to me.  FEN - OK for meds per tube, 1/2 TNA, TF @ 40 cc  DVT - SCDs, heparin gtt as above Dispo - ICU   I discussed with CCM Dr. Katrinka Blazing    LOS: 9 days    Hosie Spangle, [facility] Surgery Please see Amion for pager number during day hours 7:00am-4:30pm       01/03/2023

## 2023-01-03 NOTE — Progress Notes (Signed)
PHARMACY - TOTAL PARENTERAL NUTRITION CONSULT NOTE   Indication:  bowel discontinuity  Patient Measurements: Height: 5\' 9"  (175.3 cm) Weight: 81.6 kg (179 lb 14.3 oz) IBW/kg (Calculated) : 70.7 TPN AdjBW (KG): 81.2 Body mass index is 26.57 kg/m.  Assessment: 82 years of age male with moderate malnutrition at baseline due to chronic illness including ESRD on HD who presented with acute mesenteric ischemia s/p ex lap with R hemicolectomy, angioplasty and stenting of SMA, wound VAC placement, and bowel left in discontinuity on 11/1. Pharmacy consulted for TPN.   Pt returned to OR on 11/2 for washout with end ileostomy, JP drain placement, and abdominal wall closure 11/2. Patient was initiated on TF on 11/6. Keeping TF at 31mL/hr given inc pressor requirement, inc WBC, neuro changes. Cont TPN at 1/2 rate 11/9.  Glucose / Insulin: CBGs <180. 16 units SSI/24hrs Electrolytes: Na 132, K 4.3, Phos 2.8 (s/p NaPhos + 12.29mmol from ~10hrs of TPN), Mg 2.5, CoCa 9.9.  Renal: BUN 39; ESRD on HD TTS PTA, last HD 10/31, now requiring CRRT 11/1 >>  Hepatic: AST/ALT WNL. Tbili sl up 1.4. AlkPhos up 153. Albumin 2. TG 119 up (11/4) Intake / Output; MIVF: No UOP. CRRT removal ~3L. JP drain output 70 mL. Ileostomy output 767 mL. Net +5.2 L  GI Imaging: None since start of TPN GI Surgeries / Procedures:  None since start of TPN  Central access: CVC (double lumen) placed 11/1 - RN to dedicate lumen for TPN. TPN start date: 12/28/22  Nutritional Goals: Clinimix 8/10 2 L with NaPhos 40 meq (x 7 days @ 82 mL/hr) - SMOFlipid 250 mL daily Will provide 100% AA + 83% kcal needs  RD Assessment: Estimated Needs Total Energy Estimated Needs: 2100-2400 kcals Total Protein Estimated Needs: 145-180 g Total Fluid Estimated Needs: 1 L + UOP  Current Nutrition:  TPN TF (23mL/hr, goal 50 mL/hr)  Plan:  Continue half-rate Clinimix 8/10 at 1800 at 3ml/hr  - 1 L without lytes with NaPhos added (total  73meq/15mmol/24hr) while on CRRT - SMOF lipid 250 mL  - TPN will provide ~55% AA and 55% of kcal needs. Pt is receiving TF at 59ml/hr, 80% of goal. Patient will meet >100% protein and calorie goals with reduced TPN and TF at 8ml/hr. Continue standard trace elements to TPN Remove MVI from TPN - taking PO at bedtime  Continue Moderate q4h SSI and adjust as needed  Monitor TPN labs daily while on Clinimix, TGs qMon  D/w surg - plan to keep 1/2 rate TPN today and hopeful to d/c 11/10  Thank you for allowing pharmacy to be a part of this patient's care.  Rexford Maus, PharmD, BCPS 01/03/2023 9:57 AM

## 2023-01-03 NOTE — Progress Notes (Signed)
NAME:  Wilver Endrizzi, MRN:  [id number], DOB:  Jul 14, 1940, LOS: 9 ADMISSION DATE:  12/25/2022, CONSULTATION DATE:  12/25/2022 REFERRING MD:  EDP, CHIEF COMPLAINT:  Abdominal Pain   History of Present Illness:  82 year old man with PMHx significant for ESRD, Asbestosis, CAD s/p CABG, HTN, HL, OSA and emphysema followed by Dr. [name] who presented to the ED with acutely worsening abdominal pain that began the day of admission.  He underwent dialysis in the morning and then felt short of breath followed by abdominal discomfort.  His initial oxygen saturation was 70% on EMS arrival and his work-up in the ED included a CT Abd/pelvis concerning for acute mesenteric ischemia secondary to occlusion of the celiac artery and significant stenosis of the superior mesenteric artery and small inferior mesenteric artery.  His labs showed a lactic acid of 1.4, WBC 6.3, creatinine 3.7, BNP >1300, normal Hgb.  He was taken emergently to the OR for urgent revascularization and SMA stenting along with ex-lap.  PCCM asked to admit  Pertinent Medical History:   Past Medical History:  Diagnosis Date   Asbestosis (HCC)    Contraindication to percutaneous coronary intervention (PCI)    Coronary artery disease    Dyslipidemia    Dyspnea    ESRD on hemodialysis (HCC)    History of anemia    Hypercholesterolemia    Hypertension    Ileus (HCC)    Pleural effusion on left    Pneumonia    Sleep apnea    Significant Hospital Events: Including procedures, antibiotic start and stop dates in addition to other pertinent events   10/31 Presented with mesenteric ischemia, occlusion of the celiac and significant stenosis of the SMA and small inferior mesenteric artery. Went to OR for urgent revascularization and SMA stenting along with ex-lap. Right hemicolectomy for necrotic cecum. Significant pressor requirement and bleeding (800 cc 1st hour) from abdominal wound vac post-op, given 4L PRBC's, 500cc albumin, 500cc NS and  was on Levophed , Neo , Vaso 0.04 and Epi . Pressors were titrated down with resuscitation.  11/1. CVL placed for pressors. LE warm . Weaning pressors. Orthostatic w/ positional change. 2.2 liters positive. Changing prop to dex. Drain output decreased  11/2 Back to OR for washout, end ileostomy, and closure 11/4 Pressors weaning. SBT  11/6 starting nepro  11/7 increasing TF from 20 to 40, hoping to pull some volume on CRRT prior to considering extubation. Completed zosyn  11/8 cuff leak, adv ETT 1cm. Remains on pressors and CRRT. Adding enteral pain meds to try to reduce continuous sedation burden    Interim History / Subjective:  Less responsive this am. Winces to pain but not following commands like overnight. Pressor needs escalating  Objective:  Blood pressure (!) 124/51, pulse 88, temperature 98.2 F (36.8 C), temperature source Axillary, resp. rate 18, height 5\' 9"  (1.753 m), weight 81.6 kg, SpO2 100%. CVP:  [3 mmHg-16 mmHg] 14 mmHg  Vent Mode: CPAP;PSV FiO2 (%):  [40 %] 40 % Set Rate:  [14 bmp] 14 bmp Vt Set:  [490 mL] 490 mL PEEP:  [5 cmH20] 5 cmH20 Pressure Support:  [10 cmH20] 10 cmH20 Plateau Pressure:  [18 cmH20-24 cmH20] 22 cmH20   Intake/Output Summary (Last 24 hours) at 01/03/2023 1029 Last data filed at 01/03/2023 1000 Gross per 24 hour  Intake 3796.97 ml  Output 3902 ml  Net -105.03 ml   Filed Weights   01/01/23 0300 01/02/23 0500 01/03/23 0500  Weight: 83.8 kg 82.5 kg 81.6 kg  Physical Examination: Somnolent Winces to pain Lungs sounds fine Triggers vent Abd soft, ostomy looks okay w/ nonbloody stool Ongoing CRRT  BMP ok on CRRT H/H down slightly WBC up from 6 to 12  Assessment & Plan:   Acute resp failure w hypercarbia and hypoxia Ongoing shock state Hx emphysema Hx asbestosis  Hx OSA Acute mesenteric ischemia, Ischemic bowel- s/p ex lap x2, resection of lg bowel w ileostomy and angioplasty of SMA and closure; completed zosyn  11/7 ABLA + anemia of critical illness and chronic dz - stable low, coox high Shock, distributive- echo, coox is fine; driven by low diastolic Bps, may need to liberalize goals, on midodrine ESRD on HD - on CRRT due to ongoing vasoplegia Hyponatremia, hypervolemic  pAFib, rate controlled  Moderate malnutrition  Somnolent this am; placed A line and ABG showing CO2 retention which could explain both worsening shock and mental status.  With increased mandatory ventilation he did wake up more.  - Increase midodrine - Recheck CBC, lactate, will probably give a unit - f/u formal echo read - Hold opiates for now, can do PRN IV - I'd prefer this patient to show some progress in pressor need before tracheostomy is offered - Continue DAPT, heparin gtt - TPN to enteral transition per CCS - Levo/vaso for MAP 65 - Check AM cortisol  Best Practice: (right click and "Reselect all SmartList Selections" daily)   Diet/type: NPO - TPN and TF DVT prophylaxis: SQ heparin  GI prophylaxis: H2B Lines: Central line and Dialysis Catheter Foley:  N/A Code Status:  full code Last date of multidisciplinary goals of care discussion: 11/8  34 min cc time Myrla Halsted MD PCCM

## 2023-01-03 NOTE — Progress Notes (Signed)
Echocardiogram 2D Echocardiogram has been performed.  Kalii Chesmore N Yolunda Kloos,RDCS 01/03/2023, 8:36 AM

## 2023-01-03 NOTE — Progress Notes (Signed)
eLink Physician-Brief Progress Note Patient Name: Peter Oneill DOB: 1941-01-17 MRN: [id number]   Date of Service  01/03/2023  HPI/Events of Note  82 year old male presented with acute mesenteric ischemia but is status post washout.  Currently has an open abdomen, intubated with CRRT.  Has encephalopathy that is worsened by opiates.  Avoiding excessive opiates but per note, okay for as needed IV pushes.  eICU Interventions  Order as needed fentanyl pushes     Intervention Category Intermediate Interventions: Pain - evaluation and management  Peter Oneill 01/03/2023, 8:18 PM

## 2023-01-03 NOTE — Progress Notes (Signed)
Pt vomited and CCM called to bedside. Per CCM place pt on intermediate suction and pause Tfs

## 2023-01-04 ENCOUNTER — Inpatient Hospital Stay (HOSPITAL_COMMUNITY): Payer: Medicare HMO

## 2023-01-04 DIAGNOSIS — J9601 Acute respiratory failure with hypoxia: Secondary | ICD-10-CM | POA: Diagnosis not present

## 2023-01-04 LAB — GLUCOSE, CAPILLARY
Glucose-Capillary: 103 mg/dL — ABNORMAL HIGH (ref 70–99)
Glucose-Capillary: 128 mg/dL — ABNORMAL HIGH (ref 70–99)
Glucose-Capillary: 130 mg/dL — ABNORMAL HIGH (ref 70–99)
Glucose-Capillary: 137 mg/dL — ABNORMAL HIGH (ref 70–99)
Glucose-Capillary: 151 mg/dL — ABNORMAL HIGH (ref 70–99)
Glucose-Capillary: 85 mg/dL (ref 70–99)
Glucose-Capillary: 90 mg/dL (ref 70–99)

## 2023-01-04 LAB — MAGNESIUM: Magnesium: 2.3 mg/dL (ref 1.7–2.4)

## 2023-01-04 LAB — POCT I-STAT 7, (LYTES, BLD GAS, ICA,H+H)
Acid-base deficit: 2 mmol/L (ref 0.0–2.0)
Acid-base deficit: 1 mmol/L (ref 0.0–2.0)
Bicarbonate: 27.3 mmol/L (ref 20.0–28.0)
Bicarbonate: 23.8 mmol/L (ref 20.0–28.0)
Calcium, Ion: 1.31 mmol/L (ref 1.15–1.40)
Calcium, Ion: 1.27 mmol/L (ref 1.15–1.40)
HCT: 24 % — ABNORMAL LOW (ref 39.0–52.0)
HCT: 24 % — ABNORMAL LOW (ref 39.0–52.0)
Hemoglobin: 8.2 g/dL — ABNORMAL LOW (ref 13.0–17.0)
Hemoglobin: 8.2 g/dL — ABNORMAL LOW (ref 13.0–17.0)
O2 Saturation: 99 %
O2 Saturation: 99 %
Patient temperature: 96.5
Patient temperature: 97.9
Potassium: 4.2 mmol/L (ref 3.5–5.1)
Potassium: 4.4 mmol/L (ref 3.5–5.1)
Sodium: 134 mmol/L — ABNORMAL LOW (ref 135–145)
Sodium: 134 mmol/L — ABNORMAL LOW (ref 135–145)
TCO2: 30 mmol/L (ref 22–32)
TCO2: 25 mmol/L (ref 22–32)
pCO2 arterial: 72.2 mm[Hg] (ref 32–48)
pCO2 arterial: 40.9 mm[Hg] (ref 32–48)
pH, Arterial: 7.179 — CL (ref 7.35–7.45)
pH, Arterial: 7.371 (ref 7.35–7.45)
pO2, Arterial: 194 mm[Hg] — ABNORMAL HIGH (ref 83–108)
pO2, Arterial: 121 mm[Hg] — ABNORMAL HIGH (ref 83–108)

## 2023-01-04 LAB — RENAL FUNCTION PANEL
Albumin: 2.5 g/dL — ABNORMAL LOW (ref 3.5–5.0)
Anion gap: 5 (ref 5–15)
BUN: 31 mg/dL — ABNORMAL HIGH (ref 8–23)
CO2: 25 mmol/L (ref 22–32)
Calcium: 8.6 mg/dL — ABNORMAL LOW (ref 8.9–10.3)
Chloride: 104 mmol/L (ref 98–111)
Creatinine, Ser: 1.15 mg/dL (ref 0.61–1.24)
GFR, Estimated: >60 mL/min (ref >60)
Glucose, Bld: 142 mg/dL — ABNORMAL HIGH (ref 70–99)
Phosphorus: 2.9 mg/dL (ref 2.5–4.6)
Potassium: 4.3 mmol/L (ref 3.5–5.1)
Sodium: 134 mmol/L — ABNORMAL LOW (ref 135–145)

## 2023-01-04 LAB — PHOSPHORUS: Phosphorus: 2.2 mg/dL — ABNORMAL LOW (ref 2.5–4.6)

## 2023-01-04 LAB — CBC
HCT: 23.5 % — ABNORMAL LOW (ref 39.0–52.0)
Hemoglobin: 7.5 g/dL — ABNORMAL LOW (ref 13.0–17.0)
MCH: 31.3 pg (ref 26.0–34.0)
MCHC: 31.9 g/dL (ref 30.0–36.0)
MCV: 97.9 fL (ref 80.0–100.0)
Platelets: 214 10*3/uL (ref 150–400)
RBC: 2.4 MIL/uL — ABNORMAL LOW (ref 4.22–5.81)
RDW: 18.3 % — ABNORMAL HIGH (ref 11.5–15.5)
WBC: 14.7 10*3/uL — ABNORMAL HIGH (ref 4.0–10.5)
nRBC: 2.7 % — ABNORMAL HIGH (ref 0.0–0.2)

## 2023-01-04 LAB — COMPREHENSIVE METABOLIC PANEL
ALT: 31 U/L (ref 0–44)
AST: 38 U/L (ref 15–41)
Albumin: 1.8 g/dL — ABNORMAL LOW (ref 3.5–5.0)
Alkaline Phosphatase: 180 U/L — ABNORMAL HIGH (ref 38–126)
Anion gap: 10 (ref 5–15)
BUN: 36 mg/dL — ABNORMAL HIGH (ref 8–23)
CO2: 23 mmol/L (ref 22–32)
Calcium: 8.4 mg/dL — ABNORMAL LOW (ref 8.9–10.3)
Chloride: 100 mmol/L (ref 98–111)
Creatinine, Ser: 1.27 mg/dL — ABNORMAL HIGH (ref 0.61–1.24)
GFR, Estimated: 57 mL/min — ABNORMAL LOW (ref >60)
Glucose, Bld: 159 mg/dL — ABNORMAL HIGH (ref 70–99)
Potassium: 4 mmol/L (ref 3.5–5.1)
Sodium: 133 mmol/L — ABNORMAL LOW (ref 135–145)
Total Bilirubin: 1.4 mg/dL — ABNORMAL HIGH (ref <1.2)
Total Protein: 5.8 g/dL — ABNORMAL LOW (ref 6.5–8.1)

## 2023-01-04 LAB — HEPARIN LEVEL (UNFRACTIONATED)
Heparin Unfractionated: 0.55 [IU]/mL (ref 0.30–0.70)
Heparin Unfractionated: 0.47 [IU]/mL (ref 0.30–0.70)

## 2023-01-04 LAB — CORTISOL: Cortisol, Plasma: 29.1 ug/dL

## 2023-01-04 MED ORDER — TRACE MINERALS CU-MN-SE-ZN 300-55-60-3000 MCG/ML IV SOLN
INTRAVENOUS | Status: AC
  Filled 2023-01-04 (×2): qty 2000

## 2023-01-04 MED ORDER — TRACE MINERALS CU-MN-SE-ZN 300-55-60-3000 MCG/ML IV SOLN
INTRAVENOUS | Status: DC
  Filled 2023-01-04: qty 2000

## 2023-01-04 MED ORDER — FAT EMUL FISH OIL/PLANT BASED 20% (SMOFLIPID)IV EMUL
250.0000 mL | INTRAVENOUS | Status: AC
Start: 2023-01-04 — End: 2023-01-05
  Administered 2023-01-04: 250 mL via INTRAVENOUS
  Filled 2023-01-04: qty 250

## 2023-01-04 MED ORDER — ORAL CARE MOUTH RINSE
15.0000 mL | OROMUCOSAL | Status: DC
  Administered 2023-01-05 – 2023-01-06 (×7): 15 mL via OROMUCOSAL

## 2023-01-04 MED ORDER — ALBUMIN HUMAN 25 % IV SOLN
25.0000 g | Freq: Four times a day (QID) | INTRAVENOUS | Status: AC
  Administered 2023-01-04 – 2023-01-05 (×4): 25 g via INTRAVENOUS
  Filled 2023-01-04 (×4): qty 100

## 2023-01-04 MED ORDER — ORAL CARE MOUTH RINSE
15.0000 mL | OROMUCOSAL | Status: DC
  Administered 2023-01-04 – 2023-01-05 (×7): 15 mL via OROMUCOSAL

## 2023-01-04 MED ORDER — FAT EMUL FISH OIL/PLANT BASED 20% (SMOFLIPID)IV EMUL
250.0000 mL | INTRAVENOUS | Status: DC
Start: 2023-01-04 — End: 2023-01-04
  Filled 2023-01-04: qty 250

## 2023-01-04 MED ORDER — ORAL CARE MOUTH RINSE
15.0000 mL | OROMUCOSAL | Status: DC | PRN
Start: 2023-01-04 — End: 2023-01-06

## 2023-01-04 MED ORDER — DEXTROSE 10 % IV SOLN: INTRAVENOUS | Status: DC

## 2023-01-04 NOTE — Progress Notes (Addendum)
PHARMACY - TOTAL PARENTERAL NUTRITION CONSULT NOTE   Indication:  bowel discontinuity  Patient Measurements: Height: 5\' 9"  (175.3 cm) Weight: 81.6 kg (179 lb 14.3 oz) IBW/kg (Calculated) : 70.7 TPN AdjBW (KG): 81.2 Body mass index is 26.57 kg/m.  Assessment: 82 years of age male with moderate malnutrition at baseline due to chronic illness including ESRD on HD who presented with acute mesenteric ischemia s/p ex lap with R hemicolectomy, angioplasty and stenting of SMA, wound VAC placement, and bowel left in discontinuity on 11/1. Pharmacy consulted for TPN.   Pt returned to OR on 11/2 for washout with end ileostomy, JP drain placement, and abdominal wall closure 11/2. Patient was initiated on TF on 11/6 and was increased to 26mL/hr (goal 50 mL/hr).   TF were paused 11/9 afternoon due to emesis - NG back to suction. Discussed w/ surgery team and will adjust TPN back to full rate while TF on hold. CRRT planning to stop this evening at 6pm - remove NaPhos from this evening's bag.  Glucose / Insulin: CBGs <180. 9 units SSI/24hrs Electrolytes: Na 133, K 4, Phos 2.2, Mg 2.3, CoCa 10.2 Renal: BUN 36; ESRD on HD TTS PTA, last HD 10/31, now requiring CRRT 11/1 >> 11/10 PM Hepatic: AST/ALT WNL. Tbili sl up 1.4. AlkPhos up 180. Albumin 1.8. TG 119 up (11/4) Intake / Output; MIVF: No UOP. CRRT removal ~2.6L. NG tube output 900 mL. JP drain output 35mL. Ileostomy output . Net +4.2 L  GI Imaging: 11/10 KUB ordered to r/o post-op abscess GI Surgeries / Procedures:  None since start of TPN  Central access: CVC (double lumen) placed 11/1 - RN to dedicate lumen for TPN. TPN start date: 12/28/22  Nutritional Goals: Clinimix 8/10 2 L w/o electrolytes - SMOFlipid 250 mL daily Will provide 100% AA + 85% kcal needs  RD Assessment: Estimated Needs Total Energy Estimated Needs: 2100-2400 kcals Total Protein Estimated Needs: 145-180 g Total Fluid Estimated Needs: 1 L + UOP  Current Nutrition:   TPN TF paused 11/9 for emesis  Plan:  Increase Clinimix 8/10 at 1800 to 82 ml/hr (2L bag) - Remove NaPhos from tonight's bag per d/w nephro as CRRT stopping this evening. - SMOF lipid 250 mL  - TPN will provide 100% AA and 85% of kcal needs Continue standard trace elements to TPN, no chromium d/t shortage Add MVI back to TPN - d/w Gen surg and prefer to have MVI in TPN vs PT for now Continue Moderate q4h SSI and adjust as needed - keeping as is for now w/ TPN rate increasing but may can reduce if CBGs remain controlled Monitor TPN labs daily while on Clinimix, TGs qMon   Thank you for allowing pharmacy to be a part of this patient's care.  Rexford Maus, PharmD, BCPS 01/04/2023 8:49 AM

## 2023-01-04 NOTE — Progress Notes (Signed)
Pt is no longer responding to commands in accordance to am assessment and is somnolent.MD notified and orders in

## 2023-01-04 NOTE — Progress Notes (Signed)
PHARMACY - ANTICOAGULATION CONSULT NOTE  Pharmacy Consult for Atrial Fibrillation  Indication: atrial fibrillation  Allergies  Allergen Reactions   Atorvastatin Other (See Comments)    Muscle aches    Patient Measurements: Height: 5\' 9"  (175.3 cm) Weight: 81.6 kg (179 lb 14.3 oz) IBW/kg (Calculated) : 70.7  Vital Signs: Temp: 97.1 F (36.2 C) (11/10 0400) Temp Source: Axillary (11/10 0400) BP: 105/43 (11/10 0700) Pulse Rate: 83 (11/10 0600)  Labs: Recent Labs    01/03/23 0350 01/03/23 1010 01/03/23 1031 01/03/23 1240 01/03/23 1600 01/03/23 1710 01/03/23 1959 01/04/23 0513  HGB 7.4* 7.5*   < > 7.5*  --  8.2*  --  7.5*  HCT 23.2* 24.3*   < > 22.0*  --  24.0*  --  23.5*  PLT 192 188  --   --   --   --   --  214  HEPARINUNFRC  --   --   --   --   --   --  0.24* 0.55  CREATININE 1.29*  --   --   --  1.42*  --   --  1.27*   < > = values in this interval not displayed.    Estimated Creatinine Clearance: 45.6 mL/min (A) (by C-G formula based on SCr of 1.27 mg/dL (H)).   Medical History: Past Medical History:  Diagnosis Date   Asbestosis (HCC)    Contraindication to percutaneous coronary intervention (PCI)    Coronary artery disease    Dyslipidemia    Dyspnea    ESRD on hemodialysis (HCC)    History of anemia    Hypercholesterolemia    Hypertension    Ileus (HCC)    Pleural effusion on left    Pneumonia    Sleep apnea     Medications:  Scheduled:   arformoterol  15 mcg Nebulization BID   aspirin  81 mg Per Tube Daily   Chlorhexidine Gluconate Cloth  6 each Topical Daily   clopidogrel  75 mg Per Tube Daily   darbepoetin (ARANESP) injection - DIALYSIS  40 mcg Subcutaneous Q Mon-1800   doxercalciferol  4 mcg Intravenous Q M,W,F   feeding supplement (PROSource TF20)  60 mL Per Tube QID   gabapentin  100 mg Per Tube Q8H   insulin aspart  0-15 Units Subcutaneous Q4H   latanoprost  1 drop Both Eyes QHS   metoCLOPramide (REGLAN) injection  5 mg Intravenous  Q8H   midodrine  15 mg Per Tube Q8H   multivitamin  1 tablet Per Tube QHS   mouth rinse  15 mL Mouth Rinse Q2H   revefenacin  175 mcg Nebulization Daily   rosuvastatin  10 mg Per Tube Daily   sodium chloride flush  10-40 mL Intracatheter Q12H   Infusions:    prismasol BGK 4/2.5 400 mL/hr at 01/03/23 2148    prismasol BGK 4/2.5 400 mL/hr at 01/03/23 2147   sodium chloride     famotidine (PEPCID) IV Stopped (01/04/23 0307)   feeding supplement (VITAL 1.5 CAL) Stopped (01/03/23 1901)   heparin 1,250 Units/hr (01/04/23 0700)   norepinephrine (LEVOPHED) Adult infusion 7 mcg/min (01/04/23 0700)   prismasol BGK 4/2.5 1,400 mL/hr at 01/04/23 0447   TPN (CLINIMIX) Adult with Electrolyte Additives 42 mL/hr at 01/04/23 0700   vasopressin 0.03 Units/min (01/04/23 0700)    Assessment: 82 y/o M with mesenteric ischemia s/p OR with vascular and general surgery. S/P angioplasty and stenting of the SMA and right hemi-colectomy.  Resumed heparin  infusion for atrial fibrillation on 11/9 after being off since 11/7.   Heparin level therapeutic at 0.55 after increasing dose yesterday to 1250 units/hr. No bleeding or issues with line reported per RN.   Goal of Therapy:  Heparin level 0.3-0.7 units/ml Monitor platelets by anticoagulation protocol: Yes   Plan:  Continue heparin infusion at 1250 units/hr Heparin level in 8 hours  Daily HL, CBC, s/sx of bleeding   Thank you for allowing pharmacy to participate in this patient's care,  Cedric Fishman, PharmD, BCPS, BCCCP Clinical Pharmacist   Please check AMION for all [facility] Pharmacy phone numbers After 10:00 PM, call Main Pharmacy [phone]

## 2023-01-04 NOTE — Progress Notes (Signed)
NAME:  Peter Oneill, MRN:  [id number], DOB:  1940-11-02, LOS: 10 ADMISSION DATE:  12/25/2022, CONSULTATION DATE:  12/25/2022 REFERRING MD:  EDP, CHIEF COMPLAINT:  Abdominal Pain   History of Present Illness:  82 year old man with PMHx significant for ESRD, Asbestosis, CAD s/p CABG, HTN, HL, OSA and emphysema followed by Dr. [name] who presented to the ED with acutely worsening abdominal pain that began the day of admission.  He underwent dialysis in the morning and then felt short of breath followed by abdominal discomfort.  His initial oxygen saturation was 70% on EMS arrival and his work-up in the ED included a CT Abd/pelvis concerning for acute mesenteric ischemia secondary to occlusion of the celiac artery and significant stenosis of the superior mesenteric artery and small inferior mesenteric artery.  His labs showed a lactic acid of 1.4, WBC 6.3, creatinine 3.7, BNP >1300, normal Hgb.  He was taken emergently to the OR for urgent revascularization and SMA stenting along with ex-lap.  PCCM asked to admit  Pertinent Medical History:   Past Medical History:  Diagnosis Date   Asbestosis (HCC)    Contraindication to percutaneous coronary intervention (PCI)    Coronary artery disease    Dyslipidemia    Dyspnea    ESRD on hemodialysis (HCC)    History of anemia    Hypercholesterolemia    Hypertension    Ileus (HCC)    Pleural effusion on left    Pneumonia    Sleep apnea    Significant Hospital Events: Including procedures, antibiotic start and stop dates in addition to other pertinent events   10/31 Presented with mesenteric ischemia, occlusion of the celiac and significant stenosis of the SMA and small inferior mesenteric artery. Went to OR for urgent revascularization and SMA stenting along with ex-lap. Right hemicolectomy for necrotic cecum. Significant pressor requirement and bleeding (800 cc 1st hour) from abdominal wound vac post-op, given 4L PRBC's, 500cc albumin, 500cc NS and  was on Levophed , Neo , Vaso 0.04 and Epi . Pressors were titrated down with resuscitation.  11/1. CVL placed for pressors. LE warm . Weaning pressors. Orthostatic w/ positional change. 2.2 liters positive. Changing prop to dex. Drain output decreased  11/2 Back to OR for washout, end ileostomy, and closure 11/4 Pressors weaning. SBT  11/6 starting nepro  11/7 increasing TF from 20 to 40, hoping to pull some volume on CRRT prior to considering extubation. Completed zosyn  11/8 cuff leak, adv ETT 1cm. Remains on pressors and CRRT. Adding enteral pain meds to try to reduce continuous sedation burden   11/9 CO2 retention, backed off on opiates 11/10 improved mental status  Interim History / Subjective:  Lots of nausea and vomiting last night, tube feeds are held. He is more awake and following commands, anxious. On SBT Pressor needs have come down a bit  Objective:  Blood pressure (!) 104/47, pulse 70, temperature (!) 97.1 F (36.2 C), temperature source Axillary, resp. rate (!) 28, height 5\' 9"  (1.753 m), weight 81.6 kg, SpO2 100%.    Vent Mode: PSV;CPAP FiO2 (%):  [40 %] 40 % Set Rate:  [22 bmp-26 bmp] 26 bmp Vt Set:  [490 mL-560 mL] 560 mL PEEP:  [5 cmH20] 5 cmH20 Pressure Support:  [5 cmH20] 5 cmH20 Plateau Pressure:  [21 cmH20-29 cmH20] 23 cmH20   Intake/Output Summary (Last 24 hours) at 01/04/2023 1022 Last data filed at 01/04/2023 1000 Gross per 24 hour  Intake 2497.86 ml  Output 3666 ml  Net -  1168.14 ml   Filed Weights   01/01/23 0300 01/02/23 0500 01/03/23 0500  Weight: 83.8 kg 82.5 kg 81.6 kg   Physical Examination: Awake, anxious, moves all 4 extremities to command, trying to talk around ET tube Lungs sound clear abdomen is soft, heart sounds are regular Ostomy in place, nonbloody stool Extremities with some mild muscle wasting and trace edema RASS +1  Labs look fine on CRRT White count up slightly, hemoglobin stable Cortisol appropriately  elevated  Assessment & Plan:   Acute resp failure w hypercarbia and hypoxia- improved; see discussion below Ongoing shock state Hx emphysema Hx asbestosis  Hx OSA Acute mesenteric ischemia, Ischemic bowel- s/p ex lap x2, resection of lg bowel w ileostomy and angioplasty of SMA and closure; completed zosyn 11/7 ABLA + anemia of critical illness and chronic dz - stable low, coox high Shock, distributive- coox is fine; driven by low diastolic Bps, may need to liberalize goals, on midodrine; echo read as RV dysfunction ESRD on HD - on CRRT due to ongoing vasoplegia Hyponatremia, hypervolemic  pAFib, rate controlled  Moderate malnutrition  - Continue midodrine, wean levophed/vaso for SBP > 100 - Continue DAPT, heparin gtt - CT A/P today w/ contrast given rising WBC; may rechallenge after with additional TF + reglan - After discussion with patient and family will wean to extubate; tracheostomy if fails this - TPN, enteral nutrition on hold for N/V - Dispo depends on stability off vent, pressor liberation, functional gut; guarded prognosis  Best Practice: (right click and "Reselect all SmartList Selections" daily)   Diet/type: NPO - TPN DVT prophylaxis:  heparin gtt GI prophylaxis: H2B Lines: Central line and Dialysis Catheter Foley:  N/A Code Status:  full code Last date of multidisciplinary goals of care discussion: 11/9  32 min cc time Myrla Halsted MD PCCM

## 2023-01-04 NOTE — Progress Notes (Signed)
PHARMACY - ANTICOAGULATION CONSULT NOTE  Pharmacy Consult for Atrial Fibrillation  Indication: atrial fibrillation  Allergies  Allergen Reactions   Atorvastatin Other (See Comments)    Muscle aches    Patient Measurements: Height: 5\' 9"  (175.3 cm) Weight: 81.6 kg (179 lb 14.3 oz) IBW/kg (Calculated) : 70.7  Vital Signs: Temp: 97.8 F (36.6 C) (11/10 1540) Temp Source: Axillary (11/10 1540) BP: 111/50 (11/10 1445) Pulse Rate: 95 (11/10 1500)  Labs: Recent Labs    01/03/23 0350 01/03/23 1010 01/03/23 1031 01/03/23 1600 01/03/23 1710 01/03/23 1959 01/04/23 0513 01/04/23 1248 01/04/23 1500  HGB 7.4* 7.5*   < >  --  8.2*  --  7.5* 8.2*  --   HCT 23.2* 24.3*   < >  --  24.0*  --  23.5* 24.0*  --   PLT 192 188  --   --   --   --  214  --   --   HEPARINUNFRC  --   --   --   --   --  0.24* 0.55  --  0.47  CREATININE 1.29*  --   --  1.42*  --   --  1.27*  --   --    < > = values in this interval not displayed.    Estimated Creatinine Clearance: 45.6 mL/min (A) (by C-G formula based on SCr of 1.27 mg/dL (H)).   Medical History: Past Medical History:  Diagnosis Date   Asbestosis (HCC)    Contraindication to percutaneous coronary intervention (PCI)    Coronary artery disease    Dyslipidemia    Dyspnea    ESRD on hemodialysis (HCC)    History of anemia    Hypercholesterolemia    Hypertension    Ileus (HCC)    Pleural effusion on left    Pneumonia    Sleep apnea     Medications:  Scheduled:   arformoterol  15 mcg Nebulization BID   aspirin  81 mg Per Tube Daily   Chlorhexidine Gluconate Cloth  6 each Topical Daily   clopidogrel  75 mg Per Tube Daily   darbepoetin (ARANESP) injection - DIALYSIS  40 mcg Subcutaneous Q Mon-1800   doxercalciferol  4 mcg Intravenous Q M,W,F   feeding supplement (PROSource TF20)  60 mL Per Tube QID   gabapentin  100 mg Per Tube Q8H   insulin aspart  0-15 Units Subcutaneous Q4H   latanoprost  1 drop Both Eyes QHS   metoCLOPramide  (REGLAN) injection  5 mg Intravenous Q8H   midodrine  15 mg Per Tube Q8H   mouth rinse  15 mL Mouth Rinse Q4H   revefenacin  175 mcg Nebulization Daily   rosuvastatin  10 mg Per Tube Daily   sodium chloride flush  10-40 mL Intracatheter Q12H   Infusions:    prismasol BGK 4/2.5 400 mL/hr at 01/04/23 1001    prismasol BGK 4/2.5 400 mL/hr at 01/04/23 1001   albumin human Stopped (01/04/23 1245)   famotidine (PEPCID) IV Stopped (01/04/23 0307)   TPN (CLINIMIX) Adult without lytes     And   fat emul(SMOFlipid)     feeding supplement (VITAL 1.5 CAL) Stopped (01/03/23 1901)   heparin 1,250 Units/hr (01/04/23 1500)   norepinephrine (LEVOPHED) Adult infusion 14 mcg/min (01/04/23 1543)   prismasol BGK 4/2.5 1,400 mL/hr at 01/04/23 1150   TPN (CLINIMIX) Adult with Electrolyte Additives 42 mL/hr at 01/04/23 1500   vasopressin 0.03 Units/min (01/04/23 1500)    Assessment: 82  y/o M with mesenteric ischemia s/p OR with vascular and general surgery. S/P angioplasty and stenting of the SMA and right hemi-colectomy.  Resumed heparin infusion for atrial fibrillation on 11/9 after being off since 11/7.   Heparin level is therapeutic at 0.47, on heparin infusion at 1250 units/hr. Hgb 7.5, plt 214. No s/sx of bleeding or infusion issues.   Goal of Therapy:  Heparin level 0.3-0.7 units/ml Monitor platelets by anticoagulation protocol: Yes   Plan:  Continue heparin infusion at 1250 units/hr Daily HL, CBC, s/sx of bleeding   Thank you for allowing pharmacy to participate in this patient's care,  Sherron Monday, PharmD, BCCCP Clinical Pharmacist  Phone: [phone] 01/04/2023 3:50 PM  Please check AMION for all [facility] Pharmacy phone numbers After 10:00 PM, call Main Pharmacy [phone]

## 2023-01-04 NOTE — Progress Notes (Addendum)
Patient ID: Peter Oneill, male   DOB: 07/03/1940, 82 y.o.   MRN: [id number] 8 Days Post-Op    Interval: Patient extubated this morning. Soft voice but oriented to self and ?2024. TF held yesterday and NG placed to suction due to vomiting   Afebrile  WBC 14 from 12, pressor requirement decreasing (levo 6 mcg, vaso 0.3)   ROS negative except as listed above. Objective: Vital signs in last 24 hours: Temp:  [97 F (36.1 C)-100 F (37.8 C)] 97.1 F (36.2 C) (11/10 0400) Pulse Rate:  [62-98] 82 (11/10 0815) Resp:  [12-35] 27 (11/10 0815) BP: (92-148)/(30-111) 112/98 (11/10 0815) SpO2:  [96 %-100 %] 100 % (11/10 0815) Arterial Line BP: (108-170)/(29-104) 108/104 (11/09 1330) FiO2 (%):  [40 %] 40 % (11/10 0732) Last BM Date : 01/03/23  Intake/Output from previous day: 11/09 0701 - 11/10 0700 In: 2643.1 [I.V.:2114; NG/GT:479.2; IV Piggyback:50] Out: 3682 [Emesis/NG output:902; Drains:35; Stool:245] Intake/Output this shift: Total I/O In: 70 [I.V.:70] Out: 77   General appearance: critically ill appearing  GI: soft, wound OK - pale fascia in tact, ileostomy mucosa still pale and dusky but having good stool output, ostomy appliance filled with semi-solid non-bloody st ool stool. Abdomen soft with mild distention  NGT - bilious effluent in cannister   Ostomy - 245 cc  JP - 35 SS   Lab Results: I have personally reviewed all labs for past 24h.  Studies/Results: ECHOCARDIOGRAM COMPLETE  Result Date: 01/03/2023    ECHOCARDIOGRAM REPORT   Patient Name:   Peter Oneill Date of Exam: 01/03/2023 Medical Rec #:  [id number]        Height:       69.0 in Accession #:    [id number]       Weight:       179.9 lb Date of Birth:  11/10/1940         BSA:          1.975 m Patient Age:    82 years         BP:           125/38 mmHg Patient Gender: M                HR:           70 bpm. Exam Location:  Inpatient Procedure: 2D Echo, Cardiac Doppler and Color Doppler Indications:    Shock  History:         Patient has prior history of Echocardiogram examinations, most                 recent 05/13/2021. CAD, Prior CABG, Signs/Symptoms:Dyspnea; Risk                 Factors:Hypertension, Dyslipidemia, Sleep Apnea, Former Smoker                 and ESRD.  Sonographer:    Raeford Razor RDCS Referring Phys: [id number] BRADLEY L ICARD IMPRESSIONS  1. Left ventricular ejection fraction, by estimation, is 60 to 65%. The left ventricle has normal function. The left ventricle has no regional wall motion abnormalities. Left ventricular diastolic function could not be evaluated. There is the interventricular septum is flattened in systole and diastole, consistent with right ventricular pressure and volume overload.  2. Right ventricular systolic function is moderately reduced. The right ventricular size is moderately enlarged.  3. Right atrial size was dilated.  4. The mitral valve is degenerative. Trivial mitral valve regurgitation. No evidence of mitral  stenosis.  5. The aortic valve was not well visualized. Aortic valve regurgitation is trivial. No aortic stenosis is present. FINDINGS  Left Ventricle: Left ventricular ejection fraction, by estimation, is 60 to 65%. The left ventricle has normal function. The left ventricle has no regional wall motion abnormalities. The left ventricular internal cavity size was normal in size. There is  no left ventricular hypertrophy. The interventricular septum is flattened in systole and diastole, consistent with right ventricular pressure and volume overload and abnormal (paradoxical) septal motion consistent with post-operative status. Left ventricular diastolic function could not be evaluated. Right Ventricle: The right ventricular size is moderately enlarged. Right ventricular wall thickness was not well visualized. Right ventricular systolic function is moderately reduced. Left Atrium: Left atrial size was normal in size. Right Atrium: Right atrial size was dilated. Pericardium: There is no  evidence of pericardial effusion. Mitral Valve: The mitral valve is degenerative in appearance. Trivial mitral valve regurgitation. No evidence of mitral valve stenosis. Tricuspid Valve: The tricuspid valve is grossly normal. Tricuspid valve regurgitation is mild . No evidence of tricuspid stenosis. Aortic Valve: The aortic valve was not well visualized. Aortic valve regurgitation is trivial. No aortic stenosis is present. Aortic valve mean gradient measures 10.7 mmHg. Aortic valve peak gradient measures 21.5 mmHg. Pulmonic Valve: The pulmonic valve was not well visualized. Pulmonic valve regurgitation is not visualized. No evidence of pulmonic stenosis. Aorta: The aortic root and ascending aorta are structurally normal, with no evidence of dilitation. Venous: IVC assessment for right atrial pressure unable to be performed due to mechanical ventilation. IAS/Shunts: The atrial septum is grossly normal.  LEFT VENTRICLE PLAX 2D LVIDd:         5.20 cm LVIDs:         3.70 cm LV PW:         1.10 cm LV IVS:        0.80 cm LVOT diam:     2.10 cm LVOT Area:     3.46 cm  LV Volumes (MOD) LV vol d, MOD A2C: 96.3 ml LV vol d, MOD A4C: 140.0 ml LV vol s, MOD A2C: 38.1 ml LV vol s, MOD A4C: 55.0 ml LV SV MOD A2C:     58.2 ml LV SV MOD A4C:     140.0 ml LV SV MOD BP:      70.5 ml RIGHT VENTRICLE            IVC RV Basal diam:  4.40 cm    IVC diam: 1.90 cm RV Mid diam:    3.80 cm RV S prime:     6.74 cm/s TAPSE (M-mode): 0.8 cm LEFT ATRIUM             Index        RIGHT ATRIUM           Index LA diam:        4.00 cm 2.03 cm/m   RA Area:     26.20 cm LA Vol (A2C):   87.7 ml 44.40 ml/m  RA Volume:   83.60 ml  42.33 ml/m LA Vol (A4C):   40.5 ml 20.51 ml/m LA Biplane Vol: 59.3 ml 30.02 ml/m  AORTIC VALVE AV Area (Vmax): 1.73 cm AV Vmax:        231.67 cm/s AV Vmean:       150.000 cm/s AV VTI:         0.374 m AV Peak Grad:   21.5 mmHg AV Mean Grad:  10.7 mmHg LVOT Vmax:      115.50 cm/s  AORTA Ao Root diam: 3.10 cm Ao Asc diam:   3.20 cm MITRAL VALVE                TRICUSPID VALVE MV Area (PHT): 3.60 cm     TR Peak grad:   23.4 mmHg MV Decel Time: 211 msec     TR Vmax:        242.00 cm/s MV E velocity: 153.00 cm/s MV A velocity: 139.00 cm/s  SHUNTS MV E/A ratio:  1.10         Systemic Diam: 2.10 cm Sunit Tolia Electronically signed by Tessa Lerner Signature Date/Time: 01/03/2023/1:45:58 PM    Final    DG CHEST PORT 1 VIEW  Result Date: 01/03/2023 CLINICAL DATA:  Ventilator dependence. EXAM: PORTABLE CHEST 1 VIEW COMPARISON:  12/27/2022 FINDINGS: Endotracheal tube tip is approximately 4 cm above the base of the carina. The NG tube passes into the stomach although the distal tip position is not included on the film. Prominent gastric bubble incompletely visualized despite the presence of a NG tube. Right IJ central line tip overlies the expected location of the innominate vein confluence. The cardio pericardial silhouette is enlarged. Interstitial markings are diffusely coarsened with chronic features. Bilateral calcified pleural plaques evident. Basilar atelectasis and small effusions again noted. IMPRESSION: 1. Stable.  Basilar atelectasis with small effusions. Electronically Signed   By: Kennith Center M.D.   On: 01/03/2023 10:16     Assessment/Plan: Acute mesenteric ischemia -  -s/p exlap, R hemicolectomy by Dr. Azucena Cecil 11/1 just after midnight. Left in discontinuity with abthera. Also underwent revascularization with angioplasty of SMA by Dr. Lenell Antu.  -S/p washout, end ileostomy, JP drain placement and abdominal closure 11/2 - POD# 8 - afebrile, decreasing pressor requirement, WBC increased to 14 today from 12 - heparin at therapeutic level and lactate 0.5 yesterday - TF held overnight due to emesis >> NG placed to suction with 900 cc out - given vomiting, leukocytosis, and history of ischemic bowel, patient may need a CT of the abdomen and pelvis to look for post-op abscess. I will start with KUB to look at bowel gas pattern  and position of NGT. Per CCM/renal it is ok to give IV contrast.  FEN - NPO, NG LIWS, hold TF, increase TPN to full support DVT - SCDs, heparin gtt as above Dispo - ICU    LOS: 10 days    Hosie Spangle, [facility] Surgery Please see Amion for pager number during day hours 7:00am-4:30pm       01/04/2023

## 2023-01-04 NOTE — Progress Notes (Signed)
   01/04/23 1940  BiPAP/CPAP/SIPAP  $ Non-Invasive Ventilator  Non-Invasive Vent Subsequent  BiPAP/CPAP/SIPAP Pt Type Adult  BiPAP/CPAP/SIPAP SERVO  Mask Type Full face mask  Mask Size Extra large  Set Rate 15 breaths/min  Respiratory Rate 36 breaths/min  Pressure Support  (pc 15)  PEEP 6 cmH20  FiO2 (%) 40 %  Minute Ventilation 19.4  Leak 72  Peak Inspiratory Pressure (PIP) 22  Tidal Volume (Vt) 435  Patient Home Equipment No  Auto Titrate No  BiPAP/CPAP /SiPAP Vitals  Bilateral Breath Sounds Diminished

## 2023-01-04 NOTE — Procedures (Signed)
Extubation Procedure Note  Patient Details:   Name: Peter Oneill DOB: 1940/12/31 MRN: [id number]   Airway Documentation:    Vent end date: (not recorded) Vent end time: (not recorded)   Evaluation  O2 sats: stable throughout Complications: No apparent complications Patient did tolerate procedure well. Bilateral Breath Sounds: Clear, Diminished   Yes  Epifanio Lesches A 01/04/2023, 7:52 AM

## 2023-01-04 NOTE — Progress Notes (Signed)
Patient ID: Peter Oneill, male   DOB: 02/19/1941, 82 y.o.   MRN: [id number] [facility] Progress Note   Assessment/ Plan:   1.  Acute mesenteric ischemia with ischemic gut injury: Status post exploratory laparotomy with large bowel resection and ileostomy along with SMA angioplasty.  Ongoing management per surgery and vascular surgery-remains on heparin infusion for SMA stent along with TPN for parenteral nutrition.  Tube feeds discontinued with NG tube placed to suction due to emesis. 2. ESRD: Previously was on hemodialysis on a TTS schedule and has been on CRRT since 11/1 in the setting of hemodynamic instability/critical illness and large volume requirements; continue net negative fluid balance on CRRT for the next 9 hours (discontinue CRRT today at 6 PM with the intention of weaning him off pressors and transitioning to intermittent hemodialysis).  Calculated hourly fluid input is about 60-70 cc/h at this time. 3. Anemia: Hemoglobin and hematocrit after initial downtrend noted.  No overt loss and no indication for PRBC. 4. CKD-MBD: Phosphorus level marginally low but overall improved with addition of phosphorus to TPN.  We will need to discontinue TPN phosphorus after discontinuing CRRT ([facility] alerted). 5. Nutrition: On TPN with discontinuation of tube feeds secondary to emesis. 6. Hypotension: Remains on pressors with slight increase in requirements overnight.  Subjective:   Tube feeds discontinued after recurrent emesis.  Extubated this morning.  Remains on pressors and tolerating that negative fluid balance on CRRT.   Objective:   BP 98/87   Pulse 74   Temp (!) 97.1 F (36.2 C) (Axillary)   Resp 13   Ht 5\' 9"  (1.753 m)   Wt 81.6 kg   SpO2 100%   BMI 26.57 kg/m   Physical Exam: Gen: On oxygen via nasal cannula, fatigued with attempts to mouth words CVS: Pulse regular rhythm, normal rate, S1 and S2 normal Resp: Transmitted breath sounds bilaterally without distinct  rales or rhonchi Abd: Status post exploratory laparotomy with RLQ ostomy stoma.  Surgical dressings intact Ext: Trace ankle edema.  Left femoral dialysis catheter connected to CRRT.  Left upper arm AV fistula with palpable thrill  Labs: BMET Recent Labs  Lab 01/01/23 0437 01/01/23 1509 01/02/23 0419 01/02/23 1609 01/03/23 0350 01/03/23 1031 01/03/23 1240 01/03/23 1600 01/03/23 1710 01/04/23 0513  NA 131* 133* 132* 132* 132* 132* 132* 131* 132* 133*  K 4.0 3.8 3.9 4.0 4.3 4.5 4.5 4.4 4.6 4.0  CL 101 102 98 102 102  --   --  100  --  100  CO2 23 26 24 23 24   --   --  21*  --  23  GLUCOSE 151* 152* 167* 154* 145*  --   --  150*  --  159*  BUN 32* 33* 36* 41* 39*  --   --  42*  --  36*  CREATININE 1.29* 1.23 1.24 1.36* 1.29*  --   --  1.42*  --  1.27*  CALCIUM 8.3* 8.1* 8.0* 8.0* 8.3*  --   --  8.4*  --  8.4*  PHOS 1.9* 6.1* 3.6 3.2 2.8  --   --  2.6  --  2.2*   CBC Recent Labs  Lab 12/28/22 1646 12/29/22 0352 01/02/23 0419 01/03/23 0350 01/03/23 1010 01/03/23 1031 01/03/23 1240 01/03/23 1710 01/04/23 0513  WBC 11.2*   < > 6.6 12.2* 12.7*  --   --   --  14.7*  NEUTROABS 9.6*  --   --   --   --   --   --   --   --  HGB 8.7*   < > 7.6* 7.4* 7.5* 8.2* 7.5* 8.2* 7.5*  HCT 26.0*   < > 24.4* 23.2* 24.3* 24.0* 22.0* 24.0* 23.5*  MCV 93.2   < > 100.0 100.0 101.7*  --   --   --  97.9  PLT 142*   < > 177 192 188  --   --   --  214   < > = values in this interval not displayed.      Medications:     arformoterol  15 mcg Nebulization BID   aspirin  81 mg Per Tube Daily   Chlorhexidine Gluconate Cloth  6 each Topical Daily   clopidogrel  75 mg Per Tube Daily   darbepoetin (ARANESP) injection - DIALYSIS  40 mcg Subcutaneous Q Mon-1800   doxercalciferol  4 mcg Intravenous Q M,W,F   feeding supplement (PROSource TF20)  60 mL Per Tube QID   gabapentin  100 mg Per Tube Q8H   insulin aspart  0-15 Units Subcutaneous Q4H   latanoprost  1 drop Both Eyes QHS   metoCLOPramide  (REGLAN) injection  5 mg Intravenous Q8H   midodrine  15 mg Per Tube Q8H   mouth rinse  15 mL Mouth Rinse Q2H   revefenacin  175 mcg Nebulization Daily   rosuvastatin  10 mg Per Tube Daily   sodium chloride flush  10-40 mL Intracatheter Q12H   Zetta Bills, MD 01/04/2023, 9:13 AM

## 2023-01-04 NOTE — Plan of Care (Signed)

## 2023-01-05 ENCOUNTER — Inpatient Hospital Stay (HOSPITAL_COMMUNITY): Payer: Medicare HMO

## 2023-01-05 DIAGNOSIS — R401 Stupor: Secondary | ICD-10-CM | POA: Diagnosis not present

## 2023-01-05 DIAGNOSIS — R569 Unspecified convulsions: Secondary | ICD-10-CM | POA: Diagnosis not present

## 2023-01-05 DIAGNOSIS — J9601 Acute respiratory failure with hypoxia: Secondary | ICD-10-CM | POA: Diagnosis not present

## 2023-01-05 DIAGNOSIS — Z9889 Other specified postprocedural states: Secondary | ICD-10-CM | POA: Diagnosis not present

## 2023-01-05 LAB — CBC
HCT: 20 % — ABNORMAL LOW (ref 39.0–52.0)
HCT: 19.3 % — ABNORMAL LOW (ref 39.0–52.0)
HCT: 27 % — ABNORMAL LOW (ref 39.0–52.0)
Hemoglobin: 6.2 g/dL — CL (ref 13.0–17.0)
Hemoglobin: 6 g/dL — CL (ref 13.0–17.0)
Hemoglobin: 8.3 g/dL — ABNORMAL LOW (ref 13.0–17.0)
MCH: 31.2 pg (ref 26.0–34.0)
MCH: 31.4 pg (ref 26.0–34.0)
MCH: 30.5 pg (ref 26.0–34.0)
MCHC: 31 g/dL (ref 30.0–36.0)
MCHC: 31.1 g/dL (ref 30.0–36.0)
MCHC: 30.7 g/dL (ref 30.0–36.0)
MCV: 100.5 fL — ABNORMAL HIGH (ref 80.0–100.0)
MCV: 101 fL — ABNORMAL HIGH (ref 80.0–100.0)
MCV: 99.3 fL (ref 80.0–100.0)
Platelets: 176 10*3/uL (ref 150–400)
Platelets: 160 10*3/uL (ref 150–400)
Platelets: 170 10*3/uL (ref 150–400)
RBC: 1.99 MIL/uL — ABNORMAL LOW (ref 4.22–5.81)
RBC: 1.91 MIL/uL — ABNORMAL LOW (ref 4.22–5.81)
RBC: 2.72 MIL/uL — ABNORMAL LOW (ref 4.22–5.81)
RDW: 19.3 % — ABNORMAL HIGH (ref 11.5–15.5)
RDW: 19.3 % — ABNORMAL HIGH (ref 11.5–15.5)
RDW: 19.5 % — ABNORMAL HIGH (ref 11.5–15.5)
WBC: 11.4 10*3/uL — ABNORMAL HIGH (ref 4.0–10.5)
WBC: 11.2 10*3/uL — ABNORMAL HIGH (ref 4.0–10.5)
WBC: 9.4 10*3/uL (ref 4.0–10.5)
nRBC: 2 % — ABNORMAL HIGH (ref 0.0–0.2)
nRBC: 2.3 % — ABNORMAL HIGH (ref 0.0–0.2)
nRBC: 3.4 % — ABNORMAL HIGH (ref 0.0–0.2)

## 2023-01-05 LAB — HEPATITIS B SURFACE ANTIGEN: Hepatitis B Surface Ag: NONREACTIVE

## 2023-01-05 LAB — GLUCOSE, CAPILLARY
Glucose-Capillary: 120 mg/dL — ABNORMAL HIGH (ref 70–99)
Glucose-Capillary: 140 mg/dL — ABNORMAL HIGH (ref 70–99)
Glucose-Capillary: 141 mg/dL — ABNORMAL HIGH (ref 70–99)
Glucose-Capillary: 144 mg/dL — ABNORMAL HIGH (ref 70–99)
Glucose-Capillary: 146 mg/dL — ABNORMAL HIGH (ref 70–99)
Glucose-Capillary: 74 mg/dL (ref 70–99)

## 2023-01-05 LAB — COMPREHENSIVE METABOLIC PANEL
ALT: 24 U/L (ref 0–44)
AST: 29 U/L (ref 15–41)
Albumin: 2.7 g/dL — ABNORMAL LOW (ref 3.5–5.0)
Alkaline Phosphatase: 144 U/L — ABNORMAL HIGH (ref 38–126)
Anion gap: 8 (ref 5–15)
BUN: 57 mg/dL — ABNORMAL HIGH (ref 8–23)
CO2: 22 mmol/L (ref 22–32)
Calcium: 9 mg/dL (ref 8.9–10.3)
Chloride: 99 mmol/L (ref 98–111)
Creatinine, Ser: 1.79 mg/dL — ABNORMAL HIGH (ref 0.61–1.24)
GFR, Estimated: 38 mL/min — ABNORMAL LOW (ref >60)
Glucose, Bld: 160 mg/dL — ABNORMAL HIGH (ref 70–99)
Potassium: 4.4 mmol/L (ref 3.5–5.1)
Sodium: 129 mmol/L — ABNORMAL LOW (ref 135–145)
Total Bilirubin: 1.8 mg/dL — ABNORMAL HIGH (ref <1.2)
Total Protein: 6 g/dL — ABNORMAL LOW (ref 6.5–8.1)

## 2023-01-05 LAB — BASIC METABOLIC PANEL
Anion gap: 10 (ref 5–15)
BUN: 90 mg/dL — ABNORMAL HIGH (ref 8–23)
CO2: 21 mmol/L — ABNORMAL LOW (ref 22–32)
Calcium: 9.3 mg/dL (ref 8.9–10.3)
Chloride: 98 mmol/L (ref 98–111)
Creatinine, Ser: 2.58 mg/dL — ABNORMAL HIGH (ref 0.61–1.24)
GFR, Estimated: 24 mL/min — ABNORMAL LOW (ref >60)
Glucose, Bld: 172 mg/dL — ABNORMAL HIGH (ref 70–99)
Potassium: 5.2 mmol/L — ABNORMAL HIGH (ref 3.5–5.1)
Sodium: 129 mmol/L — ABNORMAL LOW (ref 135–145)

## 2023-01-05 LAB — HEPARIN LEVEL (UNFRACTIONATED): Heparin Unfractionated: 0.21 [IU]/mL — ABNORMAL LOW (ref 0.30–0.70)

## 2023-01-05 LAB — BLOOD GAS, ARTERIAL
Acid-base deficit: 10.8 mmol/L — ABNORMAL HIGH (ref 0.0–2.0)
Bicarbonate: 17.2 mmol/L — ABNORMAL LOW (ref 20.0–28.0)
O2 Saturation: 98.8 %
Patient temperature: 36.7
pCO2 arterial: 44 mm[Hg] (ref 32–48)
pH, Arterial: 7.19 — CL (ref 7.35–7.45)
pO2, Arterial: 143 mm[Hg] — ABNORMAL HIGH (ref 83–108)

## 2023-01-05 LAB — HEMOGLOBIN AND HEMATOCRIT, BLOOD
HCT: 25.8 % — ABNORMAL LOW (ref 39.0–52.0)
Hemoglobin: 8.2 g/dL — ABNORMAL LOW (ref 13.0–17.0)

## 2023-01-05 LAB — PHOSPHORUS: Phosphorus: 2.9 mg/dL (ref 2.5–4.6)

## 2023-01-05 LAB — TRIGLYCERIDES: Triglycerides: 52 mg/dL (ref <150)

## 2023-01-05 LAB — MAGNESIUM: Magnesium: 2.4 mg/dL (ref 1.7–2.4)

## 2023-01-05 LAB — PREPARE RBC (CROSSMATCH)

## 2023-01-05 MED ORDER — GABAPENTIN 250 MG/5ML PO SOLN
300.0000 mg | Freq: Every day | ORAL | Status: DC
  Administered 2023-01-06 – 2023-01-09 (×4): 300 mg
  Filled 2023-01-05 (×6): qty 6

## 2023-01-05 MED ORDER — ROCURONIUM BROMIDE 10 MG/ML (PF) SYRINGE
PREFILLED_SYRINGE | INTRAVENOUS | Status: AC
  Filled 2023-01-05: qty 10

## 2023-01-05 MED ORDER — IOPAMIDOL (ISOVUE-370) INJECTION 76%
75.0000 mL | Freq: Once | INTRAVENOUS | Status: AC | PRN
  Administered 2023-01-05: 75 mL via INTRAVENOUS

## 2023-01-05 MED ORDER — TRACE MINERALS CU-MN-SE-ZN 300-55-60-3000 MCG/ML IV SOLN
INTRAVENOUS | Status: AC
  Filled 2023-01-05 (×2): qty 2000

## 2023-01-05 MED ORDER — SODIUM CHLORIDE 0.9% IV SOLUTION: Freq: Once | INTRAVENOUS | Status: AC

## 2023-01-05 MED ORDER — MIDAZOLAM HCL 2 MG/2ML IJ SOLN: 2.0000 mg | Freq: Once | INTRAMUSCULAR | Status: AC

## 2023-01-05 MED ORDER — MIDAZOLAM HCL 2 MG/2ML IJ SOLN
INTRAMUSCULAR | Status: AC
  Administered 2023-01-05: 2 mg via INTRAVENOUS
  Filled 2023-01-05: qty 2

## 2023-01-05 MED ORDER — ETOMIDATE 2 MG/ML IV SOLN: 20.0000 mg | Freq: Once | INTRAVENOUS | Status: AC

## 2023-01-05 MED ORDER — VITAL 1.5 CAL PO LIQD
1000.0000 mL | ORAL | Status: DC
  Administered 2023-01-05 – 2023-01-06 (×2): 1000 mL

## 2023-01-05 MED ORDER — CHLORHEXIDINE GLUCONATE CLOTH 2 % EX PADS: 6.0000 | MEDICATED_PAD | Freq: Every day | CUTANEOUS | Status: DC

## 2023-01-05 MED ORDER — FENTANYL CITRATE PF 50 MCG/ML IJ SOSY: 50.0000 ug | PREFILLED_SYRINGE | Freq: Once | INTRAMUSCULAR | Status: AC

## 2023-01-05 MED ORDER — ATROPINE SULFATE 1 MG/10ML IJ SOSY
PREFILLED_SYRINGE | INTRAMUSCULAR | Status: AC
  Filled 2023-01-05: qty 10

## 2023-01-05 MED ORDER — FAT EMUL FISH OIL/PLANT BASED 20% (SMOFLIPID)IV EMUL
250.0000 mL | INTRAVENOUS | Status: AC
  Administered 2023-01-05: 250 mL via INTRAVENOUS
  Filled 2023-01-05: qty 250

## 2023-01-05 MED ORDER — FENTANYL CITRATE PF 50 MCG/ML IJ SOSY
PREFILLED_SYRINGE | INTRAMUSCULAR | Status: AC
  Administered 2023-01-05: 50 ug via INTRAVENOUS
  Filled 2023-01-05: qty 2

## 2023-01-05 MED ORDER — SODIUM BICARBONATE 8.4 % IV SOLN
100.0000 meq | Freq: Once | INTRAVENOUS | Status: AC
  Administered 2023-01-05: 100 meq via INTRAVENOUS
  Filled 2023-01-05: qty 100

## 2023-01-05 MED ORDER — DEXTROSE 10 % IV SOLN: INTRAVENOUS | Status: AC

## 2023-01-05 MED ORDER — ETOMIDATE 2 MG/ML IV SOLN
INTRAVENOUS | Status: AC
  Administered 2023-01-05: 20 mg via INTRAVENOUS
  Filled 2023-01-05: qty 20

## 2023-01-05 NOTE — Progress Notes (Signed)
Patient remains lethargic and does not respond to painful stimuli Left pupil noted to be 5 and nonreactive  Code stroke called.

## 2023-01-05 NOTE — Progress Notes (Signed)
Abg obtained from right arm.

## 2023-01-05 NOTE — Progress Notes (Signed)
Orders received to start Tube feedings at 20 mls/hour with patient on Bipap. Order verified per Dr Wynona Neat and Barnetta Chapel PA.

## 2023-01-05 NOTE — Consult Note (Signed)
NEURO HOSPITALIST CONSULT NOTE   Requesting physician: Dr. Katrinka Blazing  Reason for Consult: Acute onset of unresponsiveness with asymmetric pupils  History obtained from:  CCM Team and Chart     HPI:                                                                                                                                          Peter Oneill is an 82 y.o. male with a PMHx of asbestosis, CAD s/p CABG, OSA, pulmonary asbestosis, dyslipidemia, ESRD on HD, HTN, prior carotid endarterectomy and PVD who was admitted on 10/31 for sudden onset abdominal pain that began on the day of admission after dialysis. He had rapid progression of his pain in the emergency department that was disproportionate to his physical exam findings. CT scan confirmed mesenteric ischemia with occlusion of celiac trunk and narrowing of SMA and IMA with concern for ischemic cecum. He was taken emergently to the operating room with vascular and general surgery, requiring resection of his right colon, left in discontinuity. He underwent SMA revascularization with angioplasty and stenting. No intervention of CT occlusion. He was transferred to the ICU intubated and sedated with high OP from his wound vac.   Yesterday he was extubated and started on BIPAP. This morning BIPAP was discontinued. He was able to follow commands at that time. Today at 4:00 PM he was observed to suddenly decompensate with inability to follow any commands and was uncommunicative. Asymmetric pupils were then noted. Neurology was called to the bedside STAT. Exam revealed an unresponsive patient with eyes open, a dilated and unreactive left pupil, 3 mm and reactive right pupil, flaccid extremities x 4 and no responses to any external stimuli. Code Stroke was then called and the patient was rushed to CT.   At baseline prior to presentation on 10/31, he was fully functional and able to manage all of his daily affairs, communicating normally  and able to drive.   Past Medical History:  Diagnosis Date   Asbestosis (HCC)    Contraindication to percutaneous coronary intervention (PCI)    Coronary artery disease    Dyslipidemia    Dyspnea    ESRD on hemodialysis (HCC)    History of anemia    Hypercholesterolemia    Hypertension    Ileus (HCC)    Pleural effusion on left    Pneumonia    Sleep apnea     Past Surgical History:  Procedure Laterality Date   A/V FISTULAGRAM Left 07/17/2021   Procedure: A/V Fistulagram;  Surgeon: Victorino Sparrow, MD;  Location: [facility] INVASIVE CV LAB;  Service: Cardiovascular;  Laterality: Left;   AORTOGRAM Right 12/25/2022   Procedure: AORTOGRAM;  Surgeon: Leonie Douglas, MD;  Location: [facility] OR;  Service: Vascular;  Laterality: Right;  APPLICATION OF WOUND VAC N/A 12/25/2022   Procedure: APPLICATION OF WOUND VAC;  Surgeon: Leonie Douglas, MD;  Location: [facility] OR;  Service: Vascular;  Laterality: N/A;   AV FISTULA PLACEMENT Left 04/23/2021   Procedure: LEFT ARM  FISTULA CREATION;  Surgeon: Victorino Sparrow, MD;  Location[facility] OR;  Service: Vascular;  Laterality: Left;  PERIPHERAL NERVE BLOCK   BACK SURGERY     CARDIAC CATHETERIZATION     CAROTID ENDARTERECTOMY     CARPAL TUNNEL RELEASE     CORONARY ARTERY BYPASS GRAFT     HERNIA REPAIR     ILEOSTOMY N/A 12/27/2022   Procedure: ILEOSTOMY;  Surgeon: Berna Bue, MD;  Location: [facility] OR;  Service: General;  Laterality: N/A;   INSERTION OF ILIAC STENT N/A 12/25/2022   Procedure: MESENTERIC  STENT;  Surgeon: Leonie Douglas, MD;  Location: [facility] OR;  Service: Vascular;  Laterality: N/A;   LAPAROTOMY  12/25/2022   Procedure: EXPLORATORY LAPAROTOMY; RIGHT HEMICOLECTOMY: HEPATIC FLEXURE MOBILIZATION;  Surgeon: Leonie Douglas, MD;  Location: [facility] OR;  Service: Vascular;;   LAPAROTOMY N/A 12/27/2022   Procedure: EXPLORATORY LAPAROTOMY  AND ILEOSTOMY WITH PLACEMENT OF DRAIN.;  Surgeon: Berna Bue, MD;  Location: [facility] OR;  Service: General;  Laterality:  N/A;   PERCUTANEOUS CORONARY STENT INTERVENTION (PCI-S)     multiple   STERNOTOMY     ULTRASOUND GUIDANCE FOR VASCULAR ACCESS Right 12/25/2022   Procedure: ULTRASOUND GUIDANCE FOR VASCULAR ACCESS;  Surgeon: Leonie Douglas, MD;  Location: [facility] OR;  Service: Vascular;  Laterality: Right;    Family History  Problem Relation Age of Onset   Heart disease Father              Social History:  reports that he quit smoking about 46 years ago. His smoking use included cigarettes. He started smoking about 65 years ago. He has a 4.8 pack-year smoking history. He has never used smokeless tobacco. He reports current alcohol use. He reports that he does not use drugs.  Allergies  Allergen Reactions   Atorvastatin Other (See Comments)    Muscle aches    MEDICATIONS:                                                                                                                     Scheduled:  arformoterol  15 mcg Nebulization BID   aspirin  81 mg Per Tube Daily   atropine       Chlorhexidine Gluconate Cloth  6 each Topical Daily   Chlorhexidine Gluconate Cloth  6 each Topical Q0600   clopidogrel  75 mg Per Tube Daily   darbepoetin (ARANESP) injection - DIALYSIS  40 mcg Subcutaneous Q Mon-1800   doxercalciferol  4 mcg Intravenous Q M,W,F   feeding supplement (PROSource TF20)  60 mL Per Tube QID   gabapentin  300 mg Per Tube QHS   insulin aspart  0-15 Units Subcutaneous Q4H   latanoprost  1 drop Both Eyes QHS  metoCLOPramide (REGLAN) injection  5 mg Intravenous Q8H   midodrine  15 mg Per Tube Q8H   mouth rinse  15 mL Mouth Rinse 4 times per day   revefenacin  175 mcg Nebulization Daily   rosuvastatin  10 mg Per Tube Daily   sodium chloride flush  10-40 mL Intracatheter Q12H   Continuous:  famotidine (PEPCID) IV Stopped (01/05/23 0300)   TPN (CLINIMIX) Adult without lytes 82 mL/hr at 01/05/23 1900   And   fat emul(SMOFlipid) 20.8 mL/hr at 01/05/23 1900   feeding supplement (VITAL 1.5 CAL)  Stopped (01/05/23 1815)   norepinephrine (LEVOPHED) Adult infusion 6 mcg/min (01/05/23 1900)   vasopressin 0.04 Units/min (01/05/23 1900)    ROS:                                                                                                                                       Unable to obtain due to obtundation.    Blood pressure (!) 101/42, pulse (!) 56, temperature 98.1 F (36.7 C), temperature source Axillary, resp. rate 15, height 5\' 9"  (1.753 m), weight 82.4 kg, SpO2 94%.   General Examination:                                                                                                       Physical Exam  HEENT:  [place]/AT Lungs: Respirations unlabored Extremities: Warm and well perfused.   Neurological Examination Mental Status: In an eyes-open unresponsive state. No spontaneous movements or attempts to communicate. Unresponsive to all external stimuli.  Cranial Nerves: II: No blink to threat bilaterally. Left pupil 5 mm, round and unreactive. Right pupil 3 mm and sluggishly reactive.  III,IV, VI: Eyes are conjugate and at the midline, slightly deviated upwards. No doll's eye reflex. No nystagmus.  V: No blink to eyelid stimulation VII: Face is flaccidly symmetric VIII: No response to voice IX,X: Gag reflex deferred XI: Head is midline XII: Not following commands for assessment Motor/Sensory: Flaccid extremities x 4 with no movement to noxious stimuli.  Deep Tendon Reflexes: Hypoactive throughout Cerebellar/Gait: Unable to assess  NIHSS: 31    Lab Results: Basic Metabolic Panel: Recent Labs  Lab 01/01/23 0437 01/01/23 1509 01/02/23 0419 01/02/23 1609 01/03/23 0350 01/03/23 1031 01/03/23 1600 01/03/23 1710 01/04/23 0513 01/04/23 1248 01/04/23 1559 01/04/23 1600 01/05/23 0459  NA 131*   < > 132*   < > 132*   < > 131*   < > 133* 134* 134* 134* 129*  K 4.0   < > 3.9   < > 4.3   < > 4.4   < > 4.0 4.2 4.4 4.3 4.4  CL 101   < > 98   < > 102  --  100  --   100  --   --  104 99  CO2 23   < > 24   < > 24  --  21*  --  23  --   --  25 22  GLUCOSE 151*   < > 167*   < > 145*  --  150*  --  159*  --   --  142* 160*  BUN 32*   < > 36*   < > 39*  --  42*  --  36*  --   --  31* 57*  CREATININE 1.29*   < > 1.24   < > 1.29*  --  1.42*  --  1.27*  --   --  1.15 1.79*  CALCIUM 8.3*   < > 8.0*   < > 8.3*  --  8.4*  --  8.4*  --   --  8.6* 9.0  MG 2.2  --  2.2  --  2.5*  --   --   --  2.3  --   --   --  2.4  PHOS 1.9*   < > 3.6   < > 2.8  --  2.6  --  2.2*  --   --  2.9 2.9   < > = values in this interval not displayed.    CBC: Recent Labs  Lab 01/03/23 1010 01/03/23 1031 01/04/23 0513 01/04/23 1248 01/04/23 1559 01/05/23 0459 01/05/23 0559 01/05/23 1152 01/05/23 1818  WBC 12.7*  --  14.7*  --   --  11.4* 11.2*  --  PENDING  HGB 7.5*   < > 7.5*   < > 8.2* 6.2* 6.0* 8.2* 8.3*  HCT 24.3*   < > 23.5*   < > 24.0* 20.0* 19.3* 25.8* 27.0*  MCV 101.7*  --  97.9  --   --  100.5* 101.0*  --  99.3  PLT 188  --  214  --   --  176 160  --  170   < > = values in this interval not displayed.    Cardiac Enzymes: No results for input(s): "CKTOTAL", "CKMB", "CKMBINDEX", "TROPONINI" in the last 168 hours.  Lipid Panel: Recent Labs  Lab 01/05/23 0459  TRIG 52    Imaging: DG Abd Portable 1V  Result Date: 01/04/2023 CLINICAL DATA:  Emesis. EXAM: PORTABLE ABDOMEN - 1 VIEW COMPARISON:  CT abdomen pelvis dated 12/25/2022. FINDINGS: The bowel gas pattern is normal. Air-fluid levels and free intraperitoneal air can not be excluded on the supine exam. A catheter overlies the pelvis. No radiopaque calculus in the expected location of the urinary tract. Significant degenerative changes are seen in the lumbar spine. There are severe degenerative changes of the right hip with deformity. Moderate degenerative changes are seen in the left hip. IMPRESSION: Nonobstructive bowel gas pattern. Electronically Signed   By: Romona Curls M.D.   On: 01/04/2023 13:45      Assessment: 82 y.o. male with a PMHx of asbestosis, CAD s/p CABG, OSA, pulmonary asbestosis, dyslipidemia, ESRD on HD, HTN, prior carotid endarterectomy and PVD who is status post resection of his right colon, SMA revascularization with angioplasty and stenting. Today at 4:00 PM he was observed to suddenly decompensate with  inability to follow any commands and was uncommunicative. Asymmetric pupils were then noted. Neurology was called to the bedside STAT. Exam revealed an unresponsive patient with eyes open, a dilated and unreactive left pupil, 3 mm and reactive right pupil, flaccid extremities x 4 and no responses to any external stimuli. Code Stroke was then called and the patient was rushed to CT.  - Exam reveals an eyes-open unresponsive state with dilated and unreactive left pupil, small and sluggishly reactive right pupil, no doll's eye reflex and no limb movement to any noxious stimuli. NIHSS 31.  - CT head: No evidence of acute intracranial abnormality.  ASPECTS of 10. Left frontal lobe encephalomalacia. - Not a TNK candidate due to recent major surgery - CTA of head and neck: No large vessel occlusion. Widespread atherosclerosis in the head and neck including 70% proximal left ICA stenosis, moderate bilateral intracranial ICA stenoses, and severe right V4 stenosis. Aortic atherosclerosis. - Currently on ASA and Plavix - DDx for acute worsening includes acute brainstem stroke versus severe toxic/metabolic encephalopathy. Hypercarbic encephalopathy is a consideration. Subclinical status is also possible but is lower on the DDx.   Recommendations: - STAT MRI brain - If MRI brain is negative, will need STAT EEG - Frequent neuro checks    Addendum: - MRI brain preliminary review reveals no acute infarction or hemorrhage. A medium-sized old left frontal lobe ischemic infarction involving the cortex and underlying white matter is appreciated.  - Ordering STAT EEG to rule out seizure -  Management of hypotension per CCM - Discussed with Dr. Wilford Corner in sign out  50 minutes spent in the emergent neurological evaluation and management of this critically ill patient.  Electronically signed: Dr. Caryl Pina 01/05/2023, 6:30 PM

## 2023-01-05 NOTE — Progress Notes (Signed)
Transported patient to MRI while patient was on the ventilator. Patient remained stable during transport. 

## 2023-01-05 NOTE — TOC Progression Note (Signed)
Transition of Care [facility]) - Progression Note    Patient Details  Name: Peter Oneill MRN: [id number] Date of Birth: 1940/08/27  [facility]) CM/SW Contact  Tom-Johnson, Hershal Coria, RN Phone Number: 01/05/2023, 2:25 PM  Clinical Narrative:     Patient was extubated yesterday, 11/10/, on 6L O2/ BIPAP. CRRT stopped yesterday 11/10 and plan for iHD on Tuesday or Wednesday. Regular HD schedule is TTS. Continues on TPN.   WOC following for Ostomy care, JP drain in place.  Nephrology, Gen Sx following.   Patient not Medically ready for discharge.  CM will continue to follow as patient progresses with care towards discharge.           Expected Discharge Plan and Services                                               Social Determinants of Health (SDOH) Interventions SDOH Screenings   Food Insecurity: No Food Insecurity (12/25/2022)  Housing: Low Risk  (12/25/2022)  Transportation Needs: No Transportation Needs (12/25/2022)  Utilities: Not At Risk (12/25/2022)  Social Connections: Unknown (07/09/2021)   [facility]  Tobacco Use: Medium Risk (12/25/2022)    Readmission Risk Interventions    05/19/2021   10:24 AM  Readmission Risk Prevention Plan  Transportation Screening Complete  PCP or Specialist Appt within 3-5 Days Complete  HRI or Home Care Consult Complete  Social Work Consult for Recovery Care Planning/Counseling Complete  Palliative Care Screening Complete  Medication Review [employer]) Complete

## 2023-01-05 NOTE — Procedures (Signed)
Intubation Procedure Note  Demond Taguchi  [id number]  Nov 06, 1940  Date:01/05/23  Time:6:34 PM   Provider Performing:Infantof Villagomez B Wing Gfeller    Procedure: Intubation (31500)  Indication(s) Respiratory Failure  Consent Unable to obtain consent due to emergent nature of procedure.   Anesthesia Etomidate, Versed, and Fentanyl   Time Out Verified patient identification, verified procedure, site/side was marked, verified correct patient position, special equipment/implants available, medications/allergies/relevant history reviewed, required imaging and test results available.   Sterile Technique Usual hand hygeine, masks, and gloves were used   Procedure Description Patient positioned in bed supine.  Sedation given as noted above.  Patient was intubated with endotracheal tube using Glidescope.  View was Grade 1 full glottis .  Number of attempts was 1.  Colorimetric CO2 detector was consistent with tracheal placement.   Complications/Tolerance None; patient tolerated the procedure well. Chest X-ray is ordered to verify placement.   EBL Minimal   Specimen(s) None

## 2023-01-05 NOTE — Progress Notes (Signed)
[facility] Progress Note  Subjective: pt seen in ICU. On bipap. CRRT off, dc'd yesterday. Off levo gtt, weaning down vaso.   Vitals:   01/05/23 0850 01/05/23 0900 01/05/23 0905 01/05/23 0915  BP: (!) 123/41 (!) 124/42 (!) 124/45 (!) 122/44  Pulse: 69 68 63 62  Resp: 19 (!) 21 (!) 23 (!) 23  Temp: 97.9 F (36.6 C)  97.9 F (36.6 C)   TempSrc: Axillary  Axillary   SpO2: 100% 100% 100% 100%  Weight:      Height:        Exam: Gen: fatigued, extubated, on cpap CVS: Pulse regular rhythm, normal rate, S1 and S2 normal Resp: Transmitted breath sounds bilaterally without distinct rales or rhonchi Abd: Status post exploratory laparotomy with RLQ ostomy stoma.  Surgical dressings intact Ext: no LE edema, +L femoral temp HD cath; LUA AV fistula +bruit   Renal-related home meds: - plavix - renavite - velphoro 500 ac tid - norvasc 5 every day - others: statin, zetia, allopurinol, rapaflo      OP HD: TTS NW 4h   350/1.5    79.6kg   2/2 bath   AVF   Heparin none - last OP HD 10/31, post wt 79.5kg - getting to dry wt, good compliance w/ HD - rocaltrol 0.5 mcg  - mircera 30 mcg IV q 4 wks, last 10/19, due 11/16   Assessment/ Plan Acute mesenteric ischemia with ischemic gut injury: Status post exploratory laparotomy with large bowel resection and ileostomy along with SMA angioplasty.  Ongoing management per surgery and vascular surgery-remains on heparin infusion for SMA stent along with TPN for parenteral nutrition.  Tube feeds discontinued with NG tube placed to suction due to emesis. ESRD: usual HD is TTS. CRRT 11/1 in the setting of hemodynamic instability/critical illness. CRRT stopped yesterday 11/10 and plan is for iHD on Tuesday.  BP: remains on vaso gtt. Weaning down.  Anemia esrd: Hb 6- 7 range. Getting 2u prbc's today. Cont darbe 40 mcg weekly while here.  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 We will need to discontinue TPN phosphorous now that CRRT dc'd.   Nutrition: On TPN with discontinuation of tube feeds secondary to emesis. Hypotension: Remains on pressors with slight increase in requirements overnight.     Vinson Moselle MD  [facility] 01/05/2023, 10:04 AM  Recent Labs  Lab 01/04/23 1600 01/05/23 0459 01/05/23 0559  HGB  --  6.2* 6.0*  ALBUMIN 2.5* 2.7*  --   CALCIUM 8.6* 9.0  --   PHOS 2.9 2.9  --   CREATININE 1.15 1.79*  --   K 4.3 4.4  --    No results for input(s): "IRON", "TIBC", "FERRITIN" in the last 168 hours. Inpatient medications:  arformoterol  15 mcg Nebulization BID   aspirin  81 mg Per Tube Daily   Chlorhexidine Gluconate Cloth  6 each Topical Daily   clopidogrel  75 mg Per Tube Daily   darbepoetin (ARANESP) injection - DIALYSIS  40 mcg Subcutaneous Q Mon-1800   doxercalciferol  4 mcg Intravenous Q M,W,F   feeding supplement (PROSource TF20)  60 mL Per Tube QID   gabapentin  300 mg Per Tube QHS   insulin aspart  0-15 Units Subcutaneous Q4H   latanoprost  1 drop Both Eyes QHS   metoCLOPramide (REGLAN) injection  5 mg Intravenous Q8H   midodrine  15 mg Per Tube Q8H   mouth rinse  15 mL Mouth Rinse Q4H   mouth  rinse  15 mL Mouth Rinse 4 times per day   revefenacin  175 mcg Nebulization Daily   rosuvastatin  10 mg Per Tube Daily   sodium chloride flush  10-40 mL Intracatheter Q12H     prismasol BGK 4/2.5 400 mL/hr at 01/04/23 1001    prismasol BGK 4/2.5 400 mL/hr at 01/04/23 1001   famotidine (PEPCID) IV Stopped (01/05/23 0300)   TPN (CLINIMIX) Adult without lytes     And   fat emul(SMOFlipid)     feeding supplement (VITAL 1.5 CAL)     norepinephrine (LEVOPHED) Adult infusion Stopped (01/05/23 0549)   prismasol BGK 4/2.5 1,400 mL/hr at 01/04/23 1150   TPN (CLINIMIX) Adult without lytes 82 mL/hr at 01/05/23 0900   vasopressin 0.04 Units/min (01/05/23 0900)   fentaNYL (SUBLIMAZE) injection, midazolam, mouth rinse, mouth rinse, sodium chloride flush, white petrolatum

## 2023-01-05 NOTE — Progress Notes (Signed)
PHARMACY - TOTAL PARENTERAL NUTRITION CONSULT NOTE   Indication:  bowel discontinuity  Patient Measurements: Height: 5\' 9"  (175.3 cm) Weight: 82.4 kg (181 lb 10.5 oz) IBW/kg (Calculated) : 70.7 TPN AdjBW (KG): 81.2 Body mass index is 26.83 kg/m.  Assessment: 82 years of age male with moderate malnutrition at baseline due to chronic illness including ESRD on HD who presented with acute mesenteric ischemia s/p ex lap with R hemicolectomy, angioplasty and stenting of SMA, wound VAC placement, and bowel left in discontinuity on 11/1. Pharmacy consulted for TPN.   Pt returned to OR on 11/2 for washout with end ileostomy, JP drain placement, and abdominal wall closure 11/2. Patient was initiated on TF on 11/6 and was increased to 45mL/hr (goal 50 mL/hr). TF were paused 11/9 afternoon due to emesis - NG back to suction. CRRT discontinued 11/10 ~1900. Plan to restart TF at 23ml/hr on 11/11.   Glucose / Insulin: CBGs <180. 7 units SSI/24hrs Electrolytes: Na 129, K 4.4, Phos 2.9, Mg 2.4, coCa 10 Renal: BUN up 57; ESRD on HD TTS PTA, last HD 10/31, transitioned to CRRT 11/1 >> 11/10 PM, plan to resume iHD on 11/13 Hepatic: AST/ALT WNL. Tbili up 1.8. AlkPhos down 144. Albumin 2.7. TG 52 Intake / Output; MIVF: No UOP. CRRT removal ~1.49L. JP drain output 20mL. Ileostomy output . Net +6 L  GI Imaging: 11/10 KUB - nonobstructive bowel gas pattern GI Surgeries / Procedures:  None since start of TPN  Central access: CVC (double lumen) placed 11/1 - RN to dedicate lumen for TPN. TPN start date: 12/28/22  Nutritional Goals: Clinimix 8/10 2 L w/o electrolytes - SMOFlipid 250 mL daily Will provide 100% AA + 85% kcal needs  RD Assessment: Estimated Needs Total Energy Estimated Needs: 2100-2400 kcals Total Protein Estimated Needs: 145-180 g Total Fluid Estimated Needs: 1 L + UOP  Current Nutrition:  TPN TF 59ml/hr resumed 11/11  Plan:  Continue Clinimix 8/10 at 1800 at 82 ml/hr (2L bag) in  addition to: - SMOF lipid 250 mL  - TPN will provide 100% AA and 85% of kcal needs Continue standard trace elements to TPN, no chromium d/t shortage Add MVI back to TPN - d/w Gen surg and prefer to have MVI in TPN vs PT for now Discontinue D10 Continue Moderate q4h SSI and adjust as needed Monitor TPN labs daily while on Clinimix, TGs qMon  F/u tolerance to TF and ability to advance to goal TF and wean TPN  Thank you for allowing pharmacy to be a part of this patient's care.  Wilburn Cornelia, PharmD, BCPS Clinical Pharmacist 01/05/2023 9:50 AM   Please refer to [facility] for pharmacy phone number

## 2023-01-05 NOTE — Code Documentation (Signed)
Stroke Response Nurse Documentation Code Documentation  Peter Oneill is a 82 y.o. male admitted to [facility]  on 12/25/2022 for Mesenteric eschemia with past medical hx of asbestosis, CAD, dyslipidemia, hypercholesterolemia, HTN, ileus, carotid endardectomy, L pleural effusion, ESRD. On aspirin 81 mg daily and clopidogrel 75 mg daily. Code stroke was activated by SRN .   Patient on 62M unit where he was LKW at 1600 and now complaining of unequal pupils and unresponsive.  Stroke team at the bedside after patient activation. Patient to CT with team. NIHSS 31, see documentation for details and code stroke times. Patient with decreased LOC, disoriented, not following commands, bilateral hemianopia, bilateral arm weakness, bilateral leg weakness, bilateral decreased sensation, Global aphasia , dysarthria , and Visual  neglect on exam. The following imaging was completed:  CT Head and CTA. Patient is not a candidate for IV Thrombolytic due to recent major surgery per MD. Patient is not a candidate for IR due to no LVO noted on imaging per MD.   Care/Plan: VS/NIHSS q2hr x12, then q4hr.   Bedside handoff with RN Peter Oneill.    Felecia Jan  Stroke Response RN

## 2023-01-05 NOTE — Progress Notes (Signed)
Patient ID: Peter Oneill, male   DOB: [birth year], 82 y.o.   MRN: [id number] 9 Days Post-Op    Interval: On BiPAP.  Wife in the room.  NGT clamped.  Ileostomy working well with a lot of air and some liquid output.  Only on vaso right now, levo stopped.  No further emesis  Objective: Vital signs in last 24 hours: Temp:  [96.5 F (35.8 C)-98.7 F (37.1 C)] 97.9 F (36.6 C) (11/11 0905) Pulse Rate:  [55-95] 62 (11/11 0915) Resp:  [12-37] 23 (11/11 0915) BP: (70-145)/(30-63) 122/44 (11/11 0915) SpO2:  [91 %-100 %] 100 % (11/11 0915) FiO2 (%):  [40 %] 40 % (11/11 0800) Weight:  [82.4 kg] 82.4 kg (11/11 0500) Last BM Date : 01/04/23  Intake/Output from previous day: 11/10 0701 - 11/11 0700 In: 3370.7 [I.V.:2909.7; NG/GT:20; IV Piggyback:441] Out: 1613 [Drains:20; Stool:100] Intake/Output this shift: Total I/O In: 408 [I.V.:261.8; Blood:146.3] Out: -   General appearance: critically ill appearing  GI: soft, wound OK, fascia in tact, ileostomy mucosa still pale and dusky but having good stool output, ostomy appliance filled with semi-solid stool and air. Abdomen soft with mild distention,  drain with low amount of serosang output  NGT - clamped  Ostomy - 100 cc  JP - 20 SS   Lab Results: I have personally reviewed all labs for past 24h.  Studies/Results: DG Abd Portable 1V  Result Date: 01/04/2023 CLINICAL DATA:  Emesis. EXAM: PORTABLE ABDOMEN - 1 VIEW COMPARISON:  CT abdomen pelvis dated 12/25/2022. FINDINGS: The bowel gas pattern is normal. Air-fluid levels and free intraperitoneal air can not be excluded on the supine exam. A catheter overlies the pelvis. No radiopaque calculus in the expected location of the urinary tract. Significant degenerative changes are seen in the lumbar spine. There are severe degenerative changes of the right hip with deformity. Moderate degenerative changes are seen in the left hip. IMPRESSION: Nonobstructive bowel gas pattern. Electronically Signed    By: Romona Curls M.D.   On: 01/04/2023 13:45     Assessment/Plan: POD 9, s/p right hemicolectomy with ileostomy secondary to Acute mesenteric ischemia by Dr. Azucena Cecil 11/1 just after midnight. Left in discontinuity with abthera. Also underwent revascularization with angioplasty of SMA by Dr. Lenell Antu.  -S/p washout, end ileostomy, JP drain placement and abdominal closure 11/2 - afebrile, decreasing pressor requirement, WBC decreased to 11K - on plavix - TF held Saturday due to emesis.  NGT currently clamped - will restart low dose TFs at 20cc/hr. - no acute needs for CT at this time given bowel function, decreasing WBC, decreasing pressor requirements.  Will monitor TF tolerance  FEN - NPO, TFs at 20cc/hr, TPN DVT - SCDs, Plavix Dispo - ICU    LOS: 11 days    Letha Cape, [facility] Please see Amion for pager number during day hours 7:00am-4:30pm       01/05/2023

## 2023-01-05 NOTE — Progress Notes (Signed)
NAME:  Peter Oneill, MRN:  [id number], DOB:  Aug 25, 1940, LOS: 11 ADMISSION DATE:  12/25/2022, CONSULTATION DATE:  12/25/2022 REFERRING MD:  EDP, CHIEF COMPLAINT:  Abdominal Pain   History of Present Illness:  82 year old man with PMHx significant for ESRD, Asbestosis, CAD s/p CABG, HTN, HL, OSA and emphysema followed by Dr. [name] who presented to the ED with acutely worsening abdominal pain that began the day of admission.  He underwent dialysis in the morning and then felt short of breath followed by abdominal discomfort.  His initial oxygen saturation was 70% on EMS arrival and his work-up in the ED included a CT Abd/pelvis concerning for acute mesenteric ischemia secondary to occlusion of the celiac artery and significant stenosis of the superior mesenteric artery and small inferior mesenteric artery.  His labs showed a lactic acid of 1.4, WBC 6.3, creatinine 3.7, BNP >1300, normal Hgb.  He was taken emergently to the OR for urgent revascularization and SMA stenting along with ex-lap.  PCCM asked to admit  Pertinent Medical History:   Past Medical History:  Diagnosis Date   Asbestosis (HCC)    Contraindication to percutaneous coronary intervention (PCI)    Coronary artery disease    Dyslipidemia    Dyspnea    ESRD on hemodialysis (HCC)    History of anemia    Hypercholesterolemia    Hypertension    Ileus (HCC)    Pleural effusion on left    Pneumonia    Sleep apnea    Significant Hospital Events: Including procedures, antibiotic start and stop dates in addition to other pertinent events   10/31 Presented with mesenteric ischemia, occlusion of the celiac and significant stenosis of the SMA and small inferior mesenteric artery. Went to OR for urgent revascularization and SMA stenting along with ex-lap. Right hemicolectomy for necrotic cecum. Significant pressor requirement and bleeding (800 cc 1st hour) from abdominal wound vac post-op, given 4L PRBC's, 500cc albumin, 500cc NS and  was on Levophed , Neo , Vaso 0.04 and Epi . Pressors were titrated down with resuscitation.  11/1. CVL placed for pressors. LE warm . Weaning pressors. Orthostatic w/ positional change. 2.2 liters positive. Changing prop to dex. Drain output decreased  11/2 Back to OR for washout, end ileostomy, and closure 11/4 Pressors weaning. SBT  11/6 starting nepro  11/7 increasing TF from 20 to 40, hoping to pull some volume on CRRT prior to considering extubation. Completed zosyn  11/8 cuff leak, adv ETT 1cm. Remains on pressors and CRRT. Adding enteral pain meds to try to reduce continuous sedation burden   11/9 CO2 retention, backed off on opiates 11/10 improved mental status 11/11 on BiPAP  Interim History / Subjective:  Lethargy, minimally follows command Not able to raise his arms or legs  Objective:  Blood pressure (!) 123/41, pulse 68, temperature 97.9 F (36.6 C), temperature source Axillary, resp. rate (!) 21, height 5\' 9"  (1.753 m), weight 82.4 kg, SpO2 100%.    Vent Mode: BIPAP;PCV FiO2 (%):  [40 %] 40 % Set Rate:  [15 bmp] 15 bmp PEEP:  [6 cmH20] 6 cmH20   Intake/Output Summary (Last 24 hours) at 01/05/2023 0901 Last data filed at 01/05/2023 0900 Gross per 24 hour  Intake 3639.43 ml  Output 1416 ml  Net 2223.43 ml   Filed Weights   01/02/23 0500 01/03/23 0500 01/05/23 0500  Weight: 82.5 kg 81.6 kg 82.4 kg   Physical Examination: Is awake, appears very weak Decreased air movement bilaterally S1-S2 appreciated Bowel  sounds appreciated Trace edema  I reviewed nursing notes,last 24 h vitals and pain scores, last 48 h intake and output, last 24 h labs and trends, and last 24 h imaging results.  Assessment & Plan:   Marland Kitchen  Acute hypoxemic respiratory failure Acute hypercapnic respiratory failure -Continue BiPAP -Wean off BiPAP to nasal cannula as tolerated  .  History of emphysema .  History of asbestosis -Continue respiratory  support -Bronchodilators  .  Acute mesenteric ischemia/ischemic bowel -S/p exploratory laparotomy x 2 resection of large bowel with ileostomy and angioplasty of SMA and closure- -completed Zosyn 11/7   .  Acute blood loss anemia -Continue to monitor -Transfuse per protocol -On vasopressin-will continue to wean  .  End-stage renal disease on hemodialysis -Appreciate renal follow-up -Dialysis per renal  Paroxysmal atrial fibrillation -Rate controlled  Discussed with surgery today -Trial with tube feeds at 20 cc  Discussed with family at bedside if he continues to struggle with his breathing, currently on BiPAP will wean as tolerated  Best Practice: (right click and "Reselect all SmartList Selections" daily)   Diet/type: NPO - TPN DVT prophylaxis:  heparin gtt GI prophylaxis: H2B Lines: Central line and Dialysis Catheter Foley:  N/A Code Status:  full code Last date of multidisciplinary goals of care discussion: 11/11  The patient is critically ill with multiple organ systems failure and requires high complexity decision making for assessment and support, frequent evaluation and titration of therapies, application of advanced monitoring technologies and extensive interpretation of multiple databases. Critical Care Time devoted to patient care services described in this note independent of APP/resident time (if applicable)  is 33 minutes.   Virl Diamond MD  Pulmonary Critical Care Personal pager: See Amion If unanswered, please page CCM On-call: #[phone]

## 2023-01-05 NOTE — Progress Notes (Addendum)
eLink Physician-Brief Progress Note Patient Name: Keyshun Cohn DOB: 02-06-1941 MRN: [id number]   Date of Service  01/05/2023  HPI/Events of Note  Was on dextrose in TPN. Dextrose was stopped, tried TF trial, failed.   CBG now 74.  Can we start the d10 @ 30 back?? (on TPN at baseline but specific to him due to shortages in TPN we are having int he area)  Code stroke in evening. Intubated at 6 PM. CTH no acute changes.MRI images seen, reporting pending. Advised RN to call Neurology to review images for any CVA.     Camera eval done.  Discussed with RN. Extubated yesterday, failed BiPAP.on levo at 8, vaso.  ABG resulted 7.19, 44, 143, 17.2( metabolic and resp acidosis). vent  TV 560, peep 5, rate 24. Fio2 at 60%.on levo @ 6 vaso previous CRRT plannig iHD tomorrow but didnt do well today. ADded pressor support. May need crrt back tomorrow.  Hx of Acute mesenteric ischemia with ischemic gut injury: Status post exploratory laparotomy with large bowel resection and ileostomy along with SMA angioplasty.  Anemia, s/p PRBC. Hg 8.3. stable.  ESRD on HD, scheduled for AM.last Potassium 5.2.    eICU Interventions  Start D10 at 20 ml/hr. CBG every 4 hrs. CxR : not done. Get CxR stat Bicarb 100 meq once Follow ABG at mid night       Intervention Category Intermediate Interventions: Other:  Ranee Gosselin 01/05/2023, 9:23 PM  00:55 7.37/32/167/18.6  Discussed with RN. Wean fio2 as tolerated. Continue care  05:11 CxR lines and tubes in place.

## 2023-01-05 NOTE — Progress Notes (Signed)
PCCM Update:  Called to the bedside for bradycardia and concern of stroke due to change in mental status with fixed/dilated left pupil.   Evaluated patient with Stroke MD and RN. Left pupil is fixed and dilated, patient is unresponsive to verbal and tactile/painful stimuli. He is on bipap with inability to protect his airway.   Plan is to intubate prior to traveling to [facility] Stroke imaging.  Melody Comas, MD [facility] Office: [phone]   See Amion for personal pager PCCM on call pager [phone] until 7pm. Please call Elink 7p-7a. [phone]

## 2023-01-05 NOTE — Consult Note (Signed)
WOC Nurse ostomy follow up [name] and observes pouch change.  Stoma type/location: RMQ ileostomy, flush, sloughing.  Productive liquid brown stool Stomal assessment/size: 7/8" flush and gray  photo in chart Peristomal assessment: intact Treatment options for stomal/peristomal skin: barrier ring and 1 piece convex pouch Output liquid brown stool Ostomy pouching: 1pc. convex Education provided: POuch change performed with wife at bedside. Removed old pouch, measured stoma, discussed sloughing devitalized mucosa and that stoma is still productive. Applied barrier ring, cut pouch to fit and applied 1 piece convex pouch.  Will change twice weekly.  Wife gaining comfort with pouch changes Enrolled patient in [place] Secure Start Discharge program: Yes Will follow.  Mike Gip MSN, RN, FNP-BC CWON Wound, Ostomy, Continence Nurse Outpatient [facility] [phone] Pager [phone]

## 2023-01-05 NOTE — Progress Notes (Signed)
Patient is more lethargic Dr Melida Quitter paged to inform and RT called to place patient back on Bipap

## 2023-01-05 NOTE — Progress Notes (Signed)
Pt placed back on BiPAP at this time due to pt becoming lethargic

## 2023-01-05 NOTE — Progress Notes (Addendum)
eLink Physician-Brief Progress Note Patient Name: Peter Oneill DOB: 1940/12/21 MRN: [id number]   Date of Service  01/05/2023  HPI/Events of Note  Patient in ICU status post revascularization of celiac artery, SMA and IMA.  Hemoglobin yesterday afternoon was 8.2.  It is 6.2 this morning with no sign of bleeding.  He has improved hemodynamically with norepinephrine requirements almost off.  He remains on vasopressin.  eICU Interventions  Will repeat CBC stat and transfuse if hemoglobin remains low.  If it is low, he will need a CT scan of his abdomen and pelvis to rule out bleed.     Intervention Category Intermediate Interventions: Other:  [name] 01/05/2023, 5:45 AM  Addendum: Repeat hemoglobin is 6.  Transfuse 2 units of packed red cells.  Hold heparin.

## 2023-01-06 DIAGNOSIS — R401 Stupor: Secondary | ICD-10-CM | POA: Diagnosis not present

## 2023-01-06 DIAGNOSIS — R4182 Altered mental status, unspecified: Secondary | ICD-10-CM | POA: Diagnosis not present

## 2023-01-06 DIAGNOSIS — J9601 Acute respiratory failure with hypoxia: Secondary | ICD-10-CM | POA: Diagnosis not present

## 2023-01-06 DIAGNOSIS — Z9889 Other specified postprocedural states: Secondary | ICD-10-CM | POA: Diagnosis not present

## 2023-01-06 DIAGNOSIS — R569 Unspecified convulsions: Secondary | ICD-10-CM | POA: Diagnosis not present

## 2023-01-06 LAB — BPAM RBC
Blood Product Expiration Date: 202411272359
Blood Product Expiration Date: 202411272359
ISSUE DATE / TIME: 202411110640
ISSUE DATE / TIME: 202411110839
Unit Type and Rh: 5100
Unit Type and Rh: 5100

## 2023-01-06 LAB — HEPARIN LEVEL (UNFRACTIONATED)
Heparin Unfractionated: <0.1 [IU]/mL — ABNORMAL LOW (ref 0.30–0.70)
Heparin Unfractionated: 0.12 [IU]/mL — ABNORMAL LOW (ref 0.30–0.70)

## 2023-01-06 LAB — CBC
HCT: 24.8 % — ABNORMAL LOW (ref 39.0–52.0)
Hemoglobin: 8.3 g/dL — ABNORMAL LOW (ref 13.0–17.0)
MCH: 31.4 pg (ref 26.0–34.0)
MCHC: 33.5 g/dL (ref 30.0–36.0)
MCV: 93.9 fL (ref 80.0–100.0)
Platelets: 156 10*3/uL (ref 150–400)
RBC: 2.64 MIL/uL — ABNORMAL LOW (ref 4.22–5.81)
RDW: 19.8 % — ABNORMAL HIGH (ref 11.5–15.5)
WBC: 11.1 10*3/uL — ABNORMAL HIGH (ref 4.0–10.5)
nRBC: 2.3 % — ABNORMAL HIGH (ref 0.0–0.2)

## 2023-01-06 LAB — TYPE AND SCREEN
ABO/RH(D): O POS
Antibody Screen: NEGATIVE
Unit division: 0
Unit division: 0

## 2023-01-06 LAB — COMPREHENSIVE METABOLIC PANEL
ALT: 32 U/L (ref 0–44)
AST: 49 U/L — ABNORMAL HIGH (ref 15–41)
Albumin: 2.5 g/dL — ABNORMAL LOW (ref 3.5–5.0)
Alkaline Phosphatase: 164 U/L — ABNORMAL HIGH (ref 38–126)
Anion gap: 11 (ref 5–15)
BUN: 111 mg/dL — ABNORMAL HIGH (ref 8–23)
CO2: 18 mmol/L — ABNORMAL LOW (ref 22–32)
Calcium: 9.1 mg/dL (ref 8.9–10.3)
Chloride: 99 mmol/L (ref 98–111)
Creatinine, Ser: 2.86 mg/dL — ABNORMAL HIGH (ref 0.61–1.24)
GFR, Estimated: 21 mL/min — ABNORMAL LOW (ref >60)
Glucose, Bld: 145 mg/dL — ABNORMAL HIGH (ref 70–99)
Potassium: 4.1 mmol/L (ref 3.5–5.1)
Sodium: 128 mmol/L — ABNORMAL LOW (ref 135–145)
Total Bilirubin: 2.3 mg/dL — ABNORMAL HIGH (ref <1.2)
Total Protein: 5.6 g/dL — ABNORMAL LOW (ref 6.5–8.1)

## 2023-01-06 LAB — RENAL FUNCTION PANEL
Albumin: 2.4 g/dL — ABNORMAL LOW (ref 3.5–5.0)
Anion gap: 11 (ref 5–15)
BUN: 120 mg/dL — ABNORMAL HIGH (ref 8–23)
CO2: 18 mmol/L — ABNORMAL LOW (ref 22–32)
Calcium: 9.1 mg/dL (ref 8.9–10.3)
Chloride: 94 mmol/L — ABNORMAL LOW (ref 98–111)
Creatinine, Ser: 2.91 mg/dL — ABNORMAL HIGH (ref 0.61–1.24)
GFR, Estimated: 21 mL/min — ABNORMAL LOW (ref >60)
Glucose, Bld: 153 mg/dL — ABNORMAL HIGH (ref 70–99)
Phosphorus: 2.2 mg/dL — ABNORMAL LOW (ref 2.5–4.6)
Potassium: 3.8 mmol/L (ref 3.5–5.1)
Sodium: 123 mmol/L — ABNORMAL LOW (ref 135–145)

## 2023-01-06 LAB — BLOOD GAS, ARTERIAL
Acid-base deficit: 5.9 mmol/L — ABNORMAL HIGH (ref 0.0–2.0)
Bicarbonate: 18.6 mmol/L — ABNORMAL LOW (ref 20.0–28.0)
O2 Saturation: 100 %
Patient temperature: 36.3
pCO2 arterial: 32 mm[Hg] (ref 32–48)
pH, Arterial: 7.37 (ref 7.35–7.45)
pO2, Arterial: 167 mm[Hg] — ABNORMAL HIGH (ref 83–108)

## 2023-01-06 LAB — PHOSPHORUS: Phosphorus: 2.9 mg/dL (ref 2.5–4.6)

## 2023-01-06 LAB — MAGNESIUM: Magnesium: 2.2 mg/dL (ref 1.7–2.4)

## 2023-01-06 LAB — GLUCOSE, CAPILLARY
Glucose-Capillary: 134 mg/dL — ABNORMAL HIGH (ref 70–99)
Glucose-Capillary: 139 mg/dL — ABNORMAL HIGH (ref 70–99)
Glucose-Capillary: 142 mg/dL — ABNORMAL HIGH (ref 70–99)
Glucose-Capillary: 148 mg/dL — ABNORMAL HIGH (ref 70–99)
Glucose-Capillary: 155 mg/dL — ABNORMAL HIGH (ref 70–99)
Glucose-Capillary: 157 mg/dL — ABNORMAL HIGH (ref 70–99)
Glucose-Capillary: 88 mg/dL (ref 70–99)

## 2023-01-06 LAB — HEPATITIS B SURFACE ANTIBODY, QUANTITATIVE: Hep B S AB Quant (Post): 317 m[IU]/mL

## 2023-01-06 MED ORDER — SODIUM PHOSPHATES 45 MMOLE/15ML IV SOLN
15.0000 mmol | Freq: Once | INTRAVENOUS | Status: AC
  Administered 2023-01-06: 15 mmol via INTRAVENOUS
  Filled 2023-01-06: qty 5

## 2023-01-06 MED ORDER — HEPARIN (PORCINE) 25000 UT/250ML-% IV SOLN
1900.0000 [IU]/h | INTRAVENOUS | Status: DC
Start: 2023-01-06 — End: 2023-01-08
  Administered 2023-01-06: 1250 [IU]/h via INTRAVENOUS
  Administered 2023-01-07: 1600 [IU]/h via INTRAVENOUS
  Administered 2023-01-07: 1800 [IU]/h via INTRAVENOUS
  Filled 2023-01-06 (×2): qty 250

## 2023-01-06 MED ORDER — HEPARIN SODIUM (PORCINE) 1000 UNIT/ML DIALYSIS
1000.0000 [IU] | INTRAMUSCULAR | Status: DC | PRN
  Administered 2023-01-09: 3000 [IU] via INTRAVENOUS_CENTRAL
  Filled 2023-01-06: qty 6
  Filled 2023-01-06 (×2): qty 3

## 2023-01-06 MED ORDER — FAT EMUL FISH OIL/PLANT BASED 20% (SMOFLIPID)IV EMUL
250.0000 mL | INTRAVENOUS | Status: AC
  Administered 2023-01-06: 250 mL via INTRAVENOUS
  Filled 2023-01-06: qty 250

## 2023-01-06 MED ORDER — ORAL CARE MOUTH RINSE
15.0000 mL | OROMUCOSAL | Status: DC
  Administered 2023-01-06 – 2023-01-10 (×44): 15 mL via OROMUCOSAL

## 2023-01-06 MED ORDER — PRISMASOL BGK 4/2.5 32-4-2.5 MEQ/L REPLACEMENT SOLN
Status: DC
  Filled 2023-01-06 (×10): qty 5000

## 2023-01-06 MED ORDER — TRACE MINERALS CU-MN-SE-ZN 300-55-60-3000 MCG/ML IV SOLN
INTRAVENOUS | Status: AC
  Filled 2023-01-06 (×2): qty 2000

## 2023-01-06 MED ORDER — PRISMASOL BGK 4/2.5 32-4-2.5 MEQ/L EC SOLN
Status: DC
  Filled 2023-01-06 (×42): qty 5000

## 2023-01-06 MED ORDER — ORAL CARE MOUTH RINSE: 15.0000 mL | OROMUCOSAL | Status: DC | PRN

## 2023-01-06 NOTE — Progress Notes (Signed)
NAME:  Peter Oneill, MRN:  [id number], DOB:  [birth year], LOS: 12 ADMISSION DATE:  12/25/2022, CONSULTATION DATE:  12/25/2022 REFERRING MD:  EDP, CHIEF COMPLAINT:  Abdominal Pain   History of Present Illness:  82 year old man with PMHx significant for ESRD, Asbestosis, CAD s/p CABG, HTN, HL, OSA and emphysema followed by Dr. [name] who presented to the ED with acutely worsening abdominal pain that began the day of admission.  He underwent dialysis in the morning and then felt short of breath followed by abdominal discomfort.  His initial oxygen saturation was 70% on EMS arrival and his work-up in the ED included a CT Abd/pelvis concerning for acute mesenteric ischemia secondary to occlusion of the celiac artery and significant stenosis of the superior mesenteric artery and small inferior mesenteric artery.  His labs showed a lactic acid of 1.4, WBC 6.3, creatinine 3.7, BNP >1300, normal Hgb.  He was taken emergently to the OR for urgent revascularization and SMA stenting along with ex-lap.  PCCM asked to admit  Pertinent Medical History:   Past Medical History:  Diagnosis Date   Asbestosis (HCC)    Contraindication to percutaneous coronary intervention (PCI)    Coronary artery disease    Dyslipidemia    Dyspnea    ESRD on hemodialysis (HCC)    History of anemia    Hypercholesterolemia    Hypertension    Ileus (HCC)    Pleural effusion on left    Pneumonia    Sleep apnea    Significant Hospital Events: Including procedures, antibiotic start and stop dates in addition to other pertinent events   10/31 Presented with mesenteric ischemia, occlusion of the celiac and significant stenosis of the SMA and small inferior mesenteric artery. Went to OR for urgent revascularization and SMA stenting along with ex-lap. Right hemicolectomy for necrotic cecum. Significant pressor requirement and bleeding (800 cc 1st hour) from abdominal wound vac post-op, given 4L PRBC's, 500cc albumin, 500cc NS and  was on Levophed , Neo , Vaso 0.04 and Epi . Pressors were titrated down with resuscitation.  11/1. CVL placed for pressors. LE warm . Weaning pressors. Orthostatic w/ positional change. 2.2 liters positive. Changing prop to dex. Drain output decreased  11/2 Back to OR for washout, end ileostomy, and closure 11/4 Pressors weaning. SBT  11/6 starting nepro  11/7 increasing TF from 20 to 40, hoping to pull some volume on CRRT prior to considering extubation. Completed zosyn  11/8 cuff leak, adv ETT 1cm. Remains on pressors and CRRT. Adding enteral pain meds to try to reduce continuous sedation burden   11/9 CO2 retention, backed off on opiates 11/10 improved mental status 11/11 on BiPAP 11/11-code stroke called-MRI negative for acute stroke  Interim History / Subjective:  On vent, overnight events noted  Objective:  Blood pressure (!) 110/39, pulse 69, temperature 98.1 F (36.7 C), temperature source Axillary, resp. rate (!) 27, height 5\' 9"  (1.753 m), weight 84.7 kg, SpO2 100%. CVP:  [6 mmHg-22 mmHg] 16 mmHg  Vent Mode: PRVC FiO2 (%):  [40 %-100 %] 40 % Set Rate:  [24 bmp] 24 bmp Vt Set:  [560 mL] 560 mL PEEP:  [5 cmH20] 5 cmH20 Plateau Pressure:  [26 cmH20-29 cmH20] 26 cmH20   Intake/Output Summary (Last 24 hours) at 01/06/2023 1619 Last data filed at 01/06/2023 1500 Gross per 24 hour  Intake 3066.74 ml  Output 1065 ml  Net 2001.74 ml   Filed Weights   01/03/23 0500 01/05/23 0500 01/06/23 0500  Weight: 81.6  kg 82.4 kg 84.7 kg   Physical Examination: Elderly, awake alert, minimal interaction Decreased air entry bilaterally S1-S2 appreciated Bowel sounds appreciated Trace edema  I reviewed nursing notes, Consultant notes, last 24 h vitals and pain scores, last 48 h intake and output, last 24 h labs and trends, and last 24 h imaging results.  Assessment & Plan:   Marland Kitchen  Acute hypoxemic/hypercapnic respiratory failure -Continue mechanical ventilation -Target  TVol 6-8cc/kgIBW -Target Plateau Pressure < 30cm H20 -Target driving pressure less than 15 cm of water -Target PaO2 55-65: titrate PEEP/FiO2 per protocol -Ventilator associated pneumonia prevention protocol  .  History of emphysema .  History of asbestosis -Continue respiratory support -Continue bronchodilators  .  Acute mesenteric ischemia/ischemic bowel -S/p exploratory laparotomy x 2, resection of large bowel with ileostomy and angioplasty of SMA and closure -Completed Zosyn 11/7 -Tube feeding-trickle feeds -Appreciate surgical follow-up  .  Acute blood loss anemia -Continue to monitor -Transfuse per protocol -On vasopressors  End-stage renal disease on hemodialysis -Started on CRRT today because of his hypotension  Paroxysmal atrial fibrillation -He is rate controlled  Discussed with family at bedside -Will plan to wean relative low chance of extubation -Because of his profound weakness, likelihood of successful extubation is guarded -Possibility of needing a ventilator long-term discussed -Tracheostomy process discussed  Best Practice: (right click and "Reselect all SmartList Selections" daily)   Diet/type: NPO - TPN DVT prophylaxis:  heparin gtt GI prophylaxis: H2B Lines: Central line and Dialysis Catheter Foley:  N/A Code Status:  full code Last date of multidisciplinary goals of care discussion: 11/11  The patient is critically ill with multiple organ systems failure and requires high complexity decision making for assessment and support, frequent evaluation and titration of therapies, application of advanced monitoring technologies and extensive interpretation of multiple databases. Critical Care Time devoted to patient care services described in this note independent of APP/resident time (if applicable)  is 32 minutes.   Virl Diamond MD [facility] Personal pager: See Amion If unanswered, please page CCM On-call: #[phone]

## 2023-01-06 NOTE — Procedures (Signed)
Patient Name: Evald Valasek  MRN: [id number]  Epilepsy Attending: Charlsie Quest  Referring Physician/Provider: Caryl Pina, MD  Date: 01/06/2023 Duration: 31.39 mins  Patient history: 82yo M with ams getting eeg to evaluate for seizure  Level of alertness:  comatose/ lethargic   AEDs during EEG study: GBP  Technical aspects: This EEG study was done with scalp electrodes positioned according to the 10-20 International system of electrode placement. Electrical activity was reviewed with band pass filter of 1-70Hz , sensitivity of 7 uV/mm, display speed of 6mm/sec with a 60Hz  notched filter applied as appropriate. EEG data were recorded continuously and digitally stored.  Video monitoring was available and reviewed as appropriate.  Description: EEG showed continuous generalized 3 to 6 Hz theta-delta slowing. Hyperventilation and photic stimulation were not performed.     ABNORMALITY - Continuous slow, generalized  IMPRESSION: This study is suggestive of moderate to severe diffuse encephalopathy. No seizures or epileptiform discharges were seen throughout the recording.  Garnetta Fedrick Annabelle Harman

## 2023-01-06 NOTE — Progress Notes (Signed)
[facility] Progress Note  Subjective: + 4.4 L in yest and 0.5 L out = +3.8 L  Vitals:   01/06/23 0730 01/06/23 0745 01/06/23 0800 01/06/23 0815  BP: (!) 135/36 (!) 122/39 (!) 140/53 (!) 137/51  Pulse: 66 65 64 60  Resp: (!) 30 (!) 32 (!) 31 (!) 27  Temp:      TempSrc:      SpO2: 100% 100% 100% 100%  Weight:      Height:        Exam: Gen: back on vent, eyes open and close to voice CVS: Pulse regular rhythm, normal rate, S1 and S2 normal Resp: Transmitted breath sounds bilaterally without distinct rales or rhonchi Abd: s/p exploratory laparotomy with RLQ ostomy stoma.  Surgical dressings intact Ext: no LE edema, +L femoral temp HD cath; LUA AV fistula +bruit   Renal-related home meds: - plavix - renavite - velphoro 500 ac tid - norvasc 5 every day - others: statin, zetia, allopurinol, rapaflo      OP HD: TTS NW 4h   350/1.5    79.6kg   2/2 bath   AVF   Heparin none - last OP HD 10/31, post wt 79.5kg - getting to dry wt, good compliance w/ HD - rocaltrol 0.5 mcg  - mircera 30 mcg IV q 4 wks, last 10/19, due 11/16   Assessment/ Plan Acute mesenteric ischemia with ischemic gut injury: Status post exploratory laparotomy with large bowel resection and ileostomy along with SMA angioplasty.  Ongoing management per surgery and vascular surgery-remains on heparin infusion for SMA stent along with TPN for parenteral nutrition.  ESRD: usual HD is TTS. CRRT started 11/1 in the setting of hemodynamic instability/critical illness. CRRT stopped 11/10, but then pt decompensated and is back requiring more pressor support today. Will cancel iHD and resume CRRT.  Hypotension: on levo and vaso gtt's Anemia esrd: Hb 8s today. Cont darbe 40 mcg weekly while here.  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  Nutrition: On TPN with discontinuation of tube feeds d/t emesis   Vinson Moselle MD  [facility] 01/06/2023, 10:30 AM  Recent Labs  Lab 01/05/23 0459 01/05/23 0559 01/05/23 1818  01/06/23 0440  HGB 6.2*   < > 8.3* 8.3*  ALBUMIN 2.7*  --   --  2.5*  CALCIUM 9.0  --  9.3 9.1  PHOS 2.9  --   --  2.9  CREATININE 1.79*  --  2.58* 2.86*  K 4.4  --  5.2* 4.1   < > = values in this interval not displayed.   No results for input(s): "IRON", "TIBC", "FERRITIN" in the last 168 hours. Inpatient medications:  arformoterol  15 mcg Nebulization BID   aspirin  81 mg Per Tube Daily   Chlorhexidine Gluconate Cloth  6 each Topical Daily   clopidogrel  75 mg Per Tube Daily   darbepoetin (ARANESP) injection - DIALYSIS  40 mcg Subcutaneous Q Mon-1800   doxercalciferol  4 mcg Intravenous Q M,W,F   feeding supplement (PROSource TF20)  60 mL Per Tube QID   gabapentin  300 mg Per Tube QHS   insulin aspart  0-15 Units Subcutaneous Q4H   latanoprost  1 drop Both Eyes QHS   metoCLOPramide (REGLAN) injection  5 mg Intravenous Q8H   midodrine  15 mg Per Tube Q8H   mouth rinse  15 mL Mouth Rinse 4 times per day   revefenacin  175 mcg Nebulization Daily   rosuvastatin  10 mg Per Tube  Daily   sodium chloride flush  10-40 mL Intracatheter Q12H    famotidine (PEPCID) IV Stopped (01/06/23 0344)   feeding supplement (VITAL 1.5 CAL) 1,000 mL (01/06/23 0837)   norepinephrine (LEVOPHED) Adult infusion 6 mcg/min (01/06/23 0836)   TPN (CLINIMIX) Adult without lytes 82 mL/hr at 01/06/23 0800   vasopressin 0.04 Units/min (01/06/23 1022)   fentaNYL (SUBLIMAZE) injection, mouth rinse, sodium chloride flush, white petrolatum

## 2023-01-06 NOTE — Progress Notes (Signed)
EEG complete - results pending 

## 2023-01-06 NOTE — Progress Notes (Signed)
PHARMACY - ANTICOAGULATION CONSULT NOTE  Pharmacy Consult for Atrial Fibrillation  Indication: atrial fibrillation  Allergies  Allergen Reactions   Atorvastatin Other (See Comments)    Muscle aches    Patient Measurements: Height: 5\' 9"  (175.3 cm) Weight: 84.7 kg (186 lb 11.7 oz) IBW/kg (Calculated) : 70.7  Vital Signs: Temp: 98.1 F (36.7 C) (11/12 1932) Temp Source: Axillary (11/12 1932) BP: 118/34 (11/12 2000) Pulse Rate: 67 (11/12 2000)  Labs: Recent Labs    01/05/23 0459 01/05/23 0559 01/05/23 1152 01/05/23 1818 01/06/23 0440 01/06/23 1600 01/06/23 1929  HGB 6.2* 6.0* 8.2* 8.3* 8.3*  --   --   HCT 20.0* 19.3* 25.8* 27.0* 24.8*  --   --   PLT 176 160  --  170 156  --   --   HEPARINUNFRC 0.21*  --   --   --  <0.10*  --  0.12*  CREATININE 1.79*  --   --  2.58* 2.86* 2.91*  --     Estimated Creatinine Clearance: 19.9 mL/min (A) (by C-G formula based on SCr of 2.91 mg/dL (H)).   Medical History: Past Medical History:  Diagnosis Date   Asbestosis (HCC)    Contraindication to percutaneous coronary intervention (PCI)    Coronary artery disease    Dyslipidemia    Dyspnea    ESRD on hemodialysis (HCC)    History of anemia    Hypercholesterolemia    Hypertension    Ileus (HCC)    Pleural effusion on left    Pneumonia    Sleep apnea     Medications:  Scheduled:   arformoterol  15 mcg Nebulization BID   aspirin  81 mg Per Tube Daily   Chlorhexidine Gluconate Cloth  6 each Topical Daily   clopidogrel  75 mg Per Tube Daily   darbepoetin (ARANESP) injection - DIALYSIS  40 mcg Subcutaneous Q Mon-1800   doxercalciferol  4 mcg Intravenous Q M,W,F   feeding supplement (PROSource TF20)  60 mL Per Tube QID   gabapentin  300 mg Per Tube QHS   insulin aspart  0-15 Units Subcutaneous Q4H   latanoprost  1 drop Both Eyes QHS   metoCLOPramide (REGLAN) injection  5 mg Intravenous Q8H   midodrine  15 mg Per Tube Q8H   mouth rinse  15 mL Mouth Rinse Q2H   revefenacin   175 mcg Nebulization Daily   rosuvastatin  10 mg Per Tube Daily   sodium chloride flush  10-40 mL Intracatheter Q12H   Infusions:    prismasol BGK 4/2.5 400 mL/hr at 01/06/23 1300    prismasol BGK 4/2.5 400 mL/hr at 01/06/23 1258   famotidine (PEPCID) IV Stopped (01/06/23 0344)   TPN (CLINIMIX) Adult without lytes 82 mL/hr at 01/06/23 2000   And   fat emul(SMOFlipid) 20.8 mL/hr at 01/06/23 2000   feeding supplement (VITAL 1.5 CAL) 20 mL/hr at 01/06/23 2000   heparin 1,250 Units/hr (01/06/23 2000)   norepinephrine (LEVOPHED) Adult infusion 7 mcg/min (01/06/23 2000)   prismasol BGK 4/2.5 1,500 mL/hr at 01/06/23 2008   sodium phosphate 15 mmol in sodium chloride 0.9 % 250 mL infusion 43 mL/hr at 01/06/23 2000   vasopressin 0.04 Units/min (01/06/23 2000)    Assessment: 82 y/o M with mesenteric ischemia s/p OR with vascular and general surgery. S/P angioplasty and stenting of the SMA and right hemi-colectomy.  Resumed heparin infusion for atrial fibrillation on 11/9 after being off since 11/7. Heparin was discontinued on 11/10 due to acute  drop in Hgb and patient received 2u pRBC. Pharmacy consulted to restart heparin infusion for AFib on 11/12.  11/12 PM UPDATE: - Heparin level subtherapeutic at 0.12. No issues with heparin infusing or bleeding noted per d/w RN.   Goal of Therapy:  Heparin level 0.3-0.7 units/ml Monitor platelets by anticoagulation protocol: Yes   Plan:  Increase heparin infusion to 1400 units/hr Check heparin level in 8 hours  Monitor daily CBC, heparin level, and for s/sx of bleeding   Thank you for allowing pharmacy to participate in this patient's care,  Rexford Maus, PharmD, BCPS 01/06/2023 8:28 PM

## 2023-01-06 NOTE — Progress Notes (Signed)
Pharmacy Electrolyte Replacement  Recent Labs:  Recent Labs    01/06/23 0440 01/06/23 1600  K 4.1 3.8  MG 2.2  --   PHOS 2.9 2.2*  CREATININE 2.86* 2.91*    Low Critical Values (K </= 2.5, Phos </= 1, Mg </= 1) Present: None   Plan:  NaPhos IV x1 (switch base fluid to NS for low Na)  Rexford Maus, PharmD, BCPS 01/06/2023 5:26 PM

## 2023-01-06 NOTE — Progress Notes (Addendum)
PHARMACY - TOTAL PARENTERAL NUTRITION CONSULT NOTE   Indication:  bowel discontinuity  Patient Measurements: Height: 5\' 9"  (175.3 cm) Weight: 84.7 kg (186 lb 11.7 oz) IBW/kg (Calculated) : 70.7 TPN AdjBW (KG): 81.2 Body mass index is 27.58 kg/m.  Assessment: 82 years of age male with moderate malnutrition at baseline due to chronic illness including ESRD on HD who presented with acute mesenteric ischemia s/p ex lap with R hemicolectomy, angioplasty and stenting of SMA, wound VAC placement, and bowel left in discontinuity on 11/1. Pharmacy consulted for TPN.   Pt returned to OR on 11/2 for washout with end ileostomy, JP drain placement, and abdominal wall closure 11/2. Patient was initiated on TF on 11/6 and was increased to 76mL/hr (goal 50 mL/hr). TF were paused 11/9 afternoon due to emesis - NG back to suction. CRRT discontinued 11/10 ~1900. Patient acutely decompensated on 11/11 PM and a code stroke was called without any acute findings on [facility] or MRI Brain and patient was reintubated. Restarted TF at 51ml/hr on 11/12 AM.   Glucose / Insulin: CBGs <180. 9 units SSI/24hrs. cBG down to 74 on 11/11 PM, D10 at 68ml/hr initiated Electrolytes: Na 128, K 4.1, Phos 2.9, Mg 2.2, coCa 10.3 Renal: BUN up 111; ESRD on HD TTS PTA, last HD 10/31, transitioned to CRRT 11/1 >> 11/10 PM, CRRT to resume 11/12 Hepatic: AST/ALT WNL. Tbili up 2.3. AlkPhos up 164. Albumin 2.5. TG 52 (11/11) Intake / Output; MIVF: No UOP. NG output 400 mL. JP drain output 40mL. Ileostomy output . Net +8.8 L  GI Imaging: 11/10 KUB - nonobstructive bowel gas pattern GI Surgeries / Procedures:  None since start of TPN  Central access: CVC (double lumen) placed 11/1 - RN to dedicate lumen for TPN. TPN start date: 12/28/22  Nutritional Goals: Clinimix 8/10 2 L w/o electrolytes - SMOFlipid 250 mL daily Will provide 100% AA + 85% kcal needs  RD Assessment: Estimated Needs Total Energy Estimated Needs: 2100-2400  kcals Total Protein Estimated Needs: 145-180 g Total Fluid Estimated Needs: 1 L + UOP  Current Nutrition:  TPN TF 13ml/hr resumed 11/12  Plan:  Continue Clinimix 8/10 at 1800 at 82 ml/hr (2L bag) in addition to: - SMOF lipid 250 mL  - TPN will provide 100% AA and 85% of kcal needs Continue standard trace elements to TPN, no chromium d/t shortage Add MVI back to TPN - d/w Gen surg and prefer to have MVI in TPN vs PT for now Discontinue D10 Continue Moderate q4h SSI and adjust as needed Monitor TPN labs daily while on Clinimix, TGs qMon  Check renal function panel this evening and replete lytes prn with reinitiation of CRRT F/u tolerance to TF and ability to advance to goal TF and wean TPN  Thank you for allowing pharmacy to be a part of this patient's care.  Wilburn Cornelia, PharmD, BCPS Clinical Pharmacist 01/06/2023 10:33 AM   Please refer to [facility]ION for pharmacy phone number

## 2023-01-06 NOTE — TOC Progression Note (Signed)
Transition of Care [facility]) - Progression Note    Patient Details  Name: Peter Oneill MRN: [id number] Date of Birth: [birth year]  Transition of Care [facility]) CM/SW Contact  Tom-Johnson, Hershal Coria, RN Phone Number: 01/06/2023, 3:00 PM  Clinical Narrative:     Patient reintubated yesterday 01/05/23 for Respiratory Failure.   CM spoke with wife, [name] and daughter, [name] at bedside.  Patient from home with wife, has two supportive children. Has a cane, walker and shower seat at home.  PCP is Sigmund Hazel, MD and uses [facility] [employer] in [place].   Patient not Medically ready for discharge.  CM will continue to follow as patient progresses with care towards discharge.            Expected Discharge Plan and Services                                               Social Determinants of Health (SDOH) Interventions SDOH Screenings   Food Insecurity: No Food Insecurity (12/25/2022)  Housing: Low Risk  (12/25/2022)  Transportation Needs: No Transportation Needs (12/25/2022)  Utilities: Not At Risk (12/25/2022)  Social Connections: Unknown (07/09/2021)   Received from [place], [facility]  Tobacco Use: Medium Risk (12/25/2022)    Readmission Risk Interventions    05/19/2021   10:24 AM  Readmission Risk Prevention Plan  Transportation Screening Complete  PCP or Specialist Appt within 3-5 Days Complete  HRI or Home Care Consult Complete  Social Work Consult for Recovery Care Planning/Counseling Complete  Palliative Care Screening Complete  Medication Review Oceanographer) Complete

## 2023-01-06 NOTE — Progress Notes (Signed)
Subjective: Now following commands and interacting with family  Objective: Current vital signs: BP (!) 132/53   Pulse 70   Temp 98.2 F (36.8 C) (Axillary)   Resp (!) 32   Ht 5\' 9"  (1.753 m)   Wt 84.7 kg   SpO2 100%   BMI 27.58 kg/m  Vital signs in last 24 hours: Temp:  [97.5 F (36.4 C)-98.2 F (36.8 C)] 98.2 F (36.8 C) (11/12 1115) Pulse Rate:  [49-84] 70 (11/12 1045) Resp:  [15-37] 32 (11/12 1045) BP: (94-149)/(28-91) 132/53 (11/12 1030) SpO2:  [90 %-100 %] 100 % (11/12 1045) FiO2 (%):  [40 %-100 %] 40 % (11/12 1121) Weight:  [84.7 kg] 84.7 kg (11/12 0500)  Intake/Output from previous day: 11/11 0701 - 11/12 0700 In: 3556 [I.V.:2761.4; Blood:346.3; NG/GT:404.3; IV Piggyback:44] Out: 710 [Emesis/NG output:400; Drains:40; Stool:270] Intake/Output this shift: Total I/O In: 486.4 [I.V.:368.8; NG/GT:117.7] Out: 10 [Drains:10] Nutritional status:  Diet Order             Diet NPO time specified  Diet effective now                  HEENT: [place]/AT Lungs: Intubated  Neurologic Exam: Ment: Following simple commands. Appears fatigued. Nods head to some questions.  CN: PERRL 3 mm >> 2 mm. Briefly tracks examiner to left and right with saccadic visual pursuits. Face symmetric Motor: Diffuse weakness. Weak movement of extremities to commands. Unable to lift antigravity due to weakness versus poor cooperation. No asymmetry.  Sensory: Reacts to touch x 4    Lab Results: Results for orders placed or performed during the hospital encounter of 12/25/22 (from the past 48 hour(s))  I-STAT 7, (LYTES, BLD GAS, ICA, H+H)     Status: Abnormal   Collection Time: 01/04/23 12:48 PM  Result Value Ref Range   pH, Arterial 7.179 (LL) 7.35 - 7.45   pCO2 arterial 72.2 (HH) 32 - 48 mmHg   pO2, Arterial 194 (H) 83 - 108 mmHg   Bicarbonate 27.3 20.0 - 28.0 mmol/L   TCO2 30 22 - 32 mmol/L   O2 Saturation 99 %   Acid-base deficit 2.0 0.0 - 2.0 mmol/L   Sodium 134 (L) 135 - 145 mmol/L    Potassium 4.2 3.5 - 5.1 mmol/L   Calcium, Ion 1.31 1.15 - 1.40 mmol/L   HCT 24.0 (L) 39.0 - 52.0 %   Hemoglobin 8.2 (L) 13.0 - 17.0 g/dL   Patient temperature 16.1 F    Collection site RADIAL, ALLEN'S TEST ACCEPTABLE    Drawn by RT    Sample type ARTERIAL    Comment NOTIFIED PHYSICIAN   Heparin level (unfractionated)     Status: None   Collection Time: 01/04/23  3:00 PM  Result Value Ref Range   Heparin Unfractionated 0.47 0.30 - 0.70 IU/mL    Comment: (NOTE) The clinical reportable range upper limit is being lowered to >1.10 to align with the FDA approved guidance for the current laboratory assay.  If heparin results are below expected values, and patient dosage has  been confirmed, suggest follow up testing of antithrombin III levels. Performed at [facility], [address], [place], [place] [address]   Glucose, capillary     Status: None   Collection Time: 01/04/23  3:37 PM  Result Value Ref Range   Glucose-Capillary 85 70 - 99 mg/dL    Comment: Glucose reference range applies only to samples taken after fasting for at least 8 hours.  I-STAT  7, (LYTES, BLD GAS, ICA, H+H)     Status: Abnormal   Collection Time: 01/04/23  3:59 PM  Result Value Ref Range   pH, Arterial 7.371 7.35 - 7.45   pCO2 arterial 40.9 32 - 48 mmHg   pO2, Arterial 121 (H) 83 - 108 mmHg   Bicarbonate 23.8 20.0 - 28.0 mmol/L   TCO2 25 22 - 32 mmol/L   O2 Saturation 99 %   Acid-base deficit 1.0 0.0 - 2.0 mmol/L   Sodium 134 (L) 135 - 145 mmol/L   Potassium 4.4 3.5 - 5.1 mmol/L   Calcium, Ion 1.27 1.15 - 1.40 mmol/L   HCT 24.0 (L) 39.0 - 52.0 %   Hemoglobin 8.2 (L) 13.0 - 17.0 g/dL   Patient temperature 16.1 F    Collection site RADIAL, ALLEN'S TEST ACCEPTABLE    Drawn by RT    Sample type ARTERIAL   Renal function panel (daily at 1600)     Status: Abnormal   Collection Time: 01/04/23  4:00 PM  Result Value Ref Range   Sodium 134 (L) 135 - 145 mmol/L   Potassium 4.3 3.5 - 5.1 mmol/L    Chloride 104 98 - 111 mmol/L   CO2 25 22 - 32 mmol/L   Glucose, Bld 142 (H) 70 - 99 mg/dL    Comment: Glucose reference range applies only to samples taken after fasting for at least 8 hours.   BUN 31 (H) 8 - 23 mg/dL   Creatinine, Ser 0.96 0.61 - 1.24 mg/dL   Calcium 8.6 (L) 8.9 - 10.3 mg/dL   Phosphorus 2.9 2.5 - 4.6 mg/dL   Albumin 2.5 (L) 3.5 - 5.0 g/dL   GFR, Estimated >04 >54 mL/min    Comment: (NOTE) Calculated using the CKD-EPI Creatinine Equation (2021)    Anion gap 5 5 - 15    Comment: Performed at [facility], [address], [place], [place] [address]  Glucose, capillary     Status: Abnormal   Collection Time: 01/04/23  7:17 PM  Result Value Ref Range   Glucose-Capillary 151 (H) 70 - 99 mg/dL    Comment: Glucose reference range applies only to samples taken after fasting for at least 8 hours.  Glucose, capillary     Status: Abnormal   Collection Time: 01/04/23 11:03 PM  Result Value Ref Range   Glucose-Capillary 130 (H) 70 - 99 mg/dL    Comment: Glucose reference range applies only to samples taken after fasting for at least 8 hours.  Glucose, capillary     Status: Abnormal   Collection Time: 01/05/23  3:02 AM  Result Value Ref Range   Glucose-Capillary 146 (H) 70 - 99 mg/dL    Comment: Glucose reference range applies only to samples taken after fasting for at least 8 hours.  Triglycerides     Status: None   Collection Time: 01/05/23  4:59 AM  Result Value Ref Range   Triglycerides 52 <150 mg/dL    Comment: Performed at [facility], [address] [place], [place] [address]  Comprehensive metabolic panel     Status: Abnormal   Collection Time: 01/05/23  4:59 AM  Result Value Ref Range   Sodium 129 (L) 135 - 145 mmol/L   Potassium 4.4 3.5 - 5.1 mmol/L   Chloride 99 98 - 111 mmol/L   CO2 22 22 - 32 mmol/L   Glucose, Bld 160 (H) 70 - 99 mg/dL    Comment: Glucose reference range applies  only to samples taken after fasting for at least 8 hours.   BUN  57 (H) 8 - 23 mg/dL   Creatinine, Ser 4.40 (H) 0.61 - 1.24 mg/dL   Calcium 9.0 8.9 - 34.7 mg/dL   Total Protein 6.0 (L) 6.5 - 8.1 g/dL   Albumin 2.7 (L) 3.5 - 5.0 g/dL   AST 29 15 - 41 U/L   ALT 24 0 - 44 U/L   Alkaline Phosphatase 144 (H) 38 - 126 U/L   Total Bilirubin 1.8 (H) <1.2 mg/dL   GFR, Estimated 38 (L) >60 mL/min    Comment: (NOTE) Calculated using the CKD-EPI Creatinine Equation (2021)    Anion gap 8 5 - 15    Comment: Performed at [facility], [address], [place], [place] [address]  Magnesium     Status: None   Collection Time: 01/05/23  4:59 AM  Result Value Ref Range   Magnesium 2.4 1.7 - 2.4 mg/dL    Comment: Performed at [facility], [address], [place], [place] [address]  Phosphorus     Status: None   Collection Time: 01/05/23  4:59 AM  Result Value Ref Range   Phosphorus 2.9 2.5 - 4.6 mg/dL    Comment: Performed at [facility], [address] [place], [place] [address]  CBC     Status: Abnormal   Collection Time: 01/05/23  4:59 AM  Result Value Ref Range   WBC 11.4 (H) 4.0 - 10.5 K/uL   RBC 1.99 (L) 4.22 - 5.81 MIL/uL   Hemoglobin 6.2 (LL) 13.0 - 17.0 g/dL    Comment: REPEATED TO VERIFY THIS CRITICAL RESULT HAS VERIFIED AND BEEN CALLED TO JENNIFER PARRISH RN BY ROIDER SATRAIN ON 11 11 2024 AT 0531, AND HAS BEEN READ BACK.     HCT 20.0 (L) 39.0 - 52.0 %   MCV 100.5 (H) 80.0 - 100.0 fL   MCH 31.2 26.0 - 34.0 pg   MCHC 31.0 30.0 - 36.0 g/dL   RDW 95.1 (H) 88.4 - 16.6 %   Platelets 176 150 - 400 K/uL   nRBC 2.0 (H) 0.0 - 0.2 %    Comment: Performed at [facility], [address], [place], [place] [address]  Heparin level (unfractionated)     Status: Abnormal   Collection Time: 01/05/23  4:59 AM  Result Value Ref Range   Heparin Unfractionated 0.21 (L) 0.30 - 0.70 IU/mL    Comment: (NOTE) The clinical reportable range upper limit is being lowered to >1.10 to align with the FDA approved guidance for the current  laboratory assay.  If heparin results are below expected values, and patient dosage has  been confirmed, suggest follow up testing of antithrombin III levels. Performed at [facility], [address] [place], [place] [address]   CBC     Status: Abnormal   Collection Time: 01/05/23  5:59 AM  Result Value Ref Range   WBC 11.2 (H) 4.0 - 10.5 K/uL   RBC 1.91 (L) 4.22 - 5.81 MIL/uL   Hemoglobin 6.0 (LL) 13.0 - 17.0 g/dL    Comment: CRITICAL VALUE NOTED.  VALUE IS CONSISTENT WITH PREVIOUSLY REPORTED AND CALLED VALUE. REPEATED TO VERIFY    HCT 19.3 (L) 39.0 - 52.0 %   MCV 101.0 (H) 80.0 - 100.0 fL   MCH 31.4 26.0 - 34.0 pg   MCHC 31.1 30.0 - 36.0 g/dL   RDW 32.3 (H) 55.7 - 32.2 %  Platelets 160 150 - 400 K/uL   nRBC 2.3 (H) 0.0 - 0.2 %    Comment: Performed at [facility], [address] [place], [place] [address]  Prepare RBC (crossmatch)     Status: None   Collection Time: 01/05/23  6:23 AM  Result Value Ref Range   Order Confirmation      ORDER PROCESSED BY BLOOD BANK Performed at [facility], [address] [place], [place] [address]   Glucose, capillary     Status: Abnormal   Collection Time: 01/05/23  7:20 AM  Result Value Ref Range   Glucose-Capillary 140 (H) 70 - 99 mg/dL    Comment: Glucose reference range applies only to samples taken after fasting for at least 8 hours.  Glucose, capillary     Status: Abnormal   Collection Time: 01/05/23 11:45 AM  Result Value Ref Range   Glucose-Capillary 120 (H) 70 - 99 mg/dL    Comment: Glucose reference range applies only to samples taken after fasting for at least 8 hours.  Hepatitis B surface antigen     Status: None   Collection Time: 01/05/23 11:52 AM  Result Value Ref Range   Hepatitis B Surface Ag NON REACTIVE NON REACTIVE    Comment: Performed at [facility], [address], [place], [place] [address]  Hepatitis B surface antibody,quantitative     Status: None   Collection Time: 01/05/23  11:52 AM  Result Value Ref Range   Hep B S AB Quant (Post) 317.0 Immunity>10 mIU/mL    Comment: (NOTE)  Status of Immunity                     Anti-HBs Level  ------------------                     -------------- Inconsistent with Immunity                  0.0 - 10.0 Consistent with Immunity                         >10.0 Performed At: [facility] [address] [place], [place] [id number] Jolene Schimke MD QM:[id number]   Hemoglobin and hematocrit, blood     Status: Abnormal   Collection Time: 01/05/23 11:52 AM  Result Value Ref Range   Hemoglobin 8.2 (L) 13.0 - 17.0 g/dL    Comment: REPEATED TO VERIFY POST TRANSFUSION SPECIMEN    HCT 25.8 (L) 39.0 - 52.0 %    Comment: Performed at [facility], [address], [place], [place] [address]  Glucose, capillary     Status: Abnormal   Collection Time: 01/05/23  3:16 PM  Result Value Ref Range   Glucose-Capillary 144 (H) 70 - 99 mg/dL    Comment: Glucose reference range applies only to samples taken after fasting for at least 8 hours.  Basic metabolic panel     Status: Abnormal   Collection Time: 01/05/23  6:18 PM  Result Value Ref Range   Sodium 129 (L) 135 - 145 mmol/L   Potassium 5.2 (H) 3.5 - 5.1 mmol/L   Chloride 98 98 - 111 mmol/L   CO2 21 (L) 22 - 32 mmol/L   Glucose, Bld 172 (H) 70 - 99 mg/dL    Comment: Glucose reference range applies only to samples taken after fasting for at least 8 hours.   BUN 90 (H) 8 -  23 mg/dL   Creatinine, Ser 1.61 (H) 0.61 - 1.24 mg/dL   Calcium 9.3 8.9 - 09.6 mg/dL   GFR, Estimated 24 (L) >60 mL/min    Comment: (NOTE) Calculated using the CKD-EPI Creatinine Equation (2021)    Anion gap 10 5 - 15    Comment: Performed at [facility], [address] [place], [place] [address]  CBC     Status: Abnormal   Collection Time: 01/05/23  6:18 PM  Result Value Ref Range   WBC 9.4 4.0 - 10.5 K/uL   RBC 2.72 (L) 4.22 - 5.81 MIL/uL   Hemoglobin 8.3 (L) 13.0 - 17.0 g/dL   HCT  98.1 (L) 19.1 - 52.0 %   MCV 99.3 80.0 - 100.0 fL   MCH 30.5 26.0 - 34.0 pg   MCHC 30.7 30.0 - 36.0 g/dL   RDW 47.8 (H) 29.5 - 62.1 %   Platelets 170 150 - 400 K/uL   nRBC 3.4 (H) 0.0 - 0.2 %    Comment: Performed at [facility], [address] [place], [place] [address]  Blood gas, arterial     Status: Abnormal   Collection Time: 01/05/23  8:35 PM  Result Value Ref Range   pH, Arterial 7.19 (LL) 7.35 - 7.45    Comment: CRITICAL RESULT CALLED TO, READ BACK BY AND VERIFIED WITH: B. WARNER RN 01/05/23 @2107  BY J. WHITE    pCO2 arterial 44 32 - 48 mmHg   pO2, Arterial 143 (H) 83 - 108 mmHg   Bicarbonate 17.2 (L) 20.0 - 28.0 mmol/L   Acid-base deficit 10.8 (H) 0.0 - 2.0 mmol/L   O2 Saturation 98.8 %   Patient temperature 36.7    Collection site LEFT RADIAL    Drawn by COLLECTED BY RT    Allens test (pass/fail) PASS PASS    Comment: Performed at [facility], [address], [place], [place] [address]  Glucose, capillary     Status: None   Collection Time: 01/05/23  8:51 PM  Result Value Ref Range   Glucose-Capillary 74 70 - 99 mg/dL    Comment: Glucose reference range applies only to samples taken after fasting for at least 8 hours.  Glucose, capillary     Status: None   Collection Time: 01/05/23 10:08 PM  Result Value Ref Range   Glucose-Capillary 88 70 - 99 mg/dL    Comment: Glucose reference range applies only to samples taken after fasting for at least 8 hours.  Glucose, capillary     Status: Abnormal   Collection Time: 01/05/23 11:45 PM  Result Value Ref Range   Glucose-Capillary 141 (H) 70 - 99 mg/dL    Comment: Glucose reference range applies only to samples taken after fasting for at least 8 hours.  Blood gas, arterial     Status: Abnormal   Collection Time: 01/06/23 12:28 AM  Result Value Ref Range   pH, Arterial 7.37 7.35 - 7.45   pCO2 arterial 32 32 - 48 mmHg   pO2, Arterial 167 (H) 83 - 108 mmHg   Bicarbonate 18.6 (L) 20.0 - 28.0 mmol/L   Acid-base  deficit 5.9 (H) 0.0 - 2.0 mmol/L   O2 Saturation 100 %   Patient temperature 36.3    Collection site RIGHT ARM    Drawn by COLLECTED BY RT    Allens test (pass/fail) PASS PASS    Comment: Performed at [facility], [address] [place], [place] [address]  Glucose, capillary     Status: Abnormal   Collection Time: 01/06/23  3:58 AM  Result Value Ref Range   Glucose-Capillary 157 (H) 70 - 99 mg/dL    Comment: Glucose reference range applies only to samples taken after fasting for at least 8 hours.  Comprehensive metabolic panel     Status: Abnormal   Collection Time: 01/06/23  4:40 AM  Result Value Ref Range   Sodium 128 (L) 135 - 145 mmol/L   Potassium 4.1 3.5 - 5.1 mmol/L   Chloride 99 98 - 111 mmol/L   CO2 18 (L) 22 - 32 mmol/L   Glucose, Bld 145 (H) 70 - 99 mg/dL    Comment: Glucose reference range applies only to samples taken after fasting for at least 8 hours.   BUN 111 (H) 8 - 23 mg/dL   Creatinine, Ser 5.36 (H) 0.61 - 1.24 mg/dL   Calcium 9.1 8.9 - 64.4 mg/dL   Total Protein 5.6 (L) 6.5 - 8.1 g/dL   Albumin 2.5 (L) 3.5 - 5.0 g/dL   AST 49 (H) 15 - 41 U/L   ALT 32 0 - 44 U/L   Alkaline Phosphatase 164 (H) 38 - 126 U/L   Total Bilirubin 2.3 (H) <1.2 mg/dL   GFR, Estimated 21 (L) >60 mL/min    Comment: (NOTE) Calculated using the CKD-EPI Creatinine Equation (2021)    Anion gap 11 5 - 15    Comment: Performed at [facility], [address] [place], [place] [address]  Magnesium     Status: None   Collection Time: 01/06/23  4:40 AM  Result Value Ref Range   Magnesium 2.2 1.7 - 2.4 mg/dL    Comment: Performed at [facility], [address] [place], [place] [address]  Phosphorus     Status: None   Collection Time: 01/06/23  4:40 AM  Result Value Ref Range   Phosphorus 2.9 2.5 - 4.6 mg/dL    Comment: Performed at [facility], [address], [place], [place] [address]  CBC     Status: Abnormal   Collection Time: 01/06/23  4:40 AM  Result  Value Ref Range   WBC 11.1 (H) 4.0 - 10.5 K/uL   RBC 2.64 (L) 4.22 - 5.81 MIL/uL   Hemoglobin 8.3 (L) 13.0 - 17.0 g/dL   HCT 43.3 (L) 29.5 - 18.8 %   MCV 93.9 80.0 - 100.0 fL   MCH 31.4 26.0 - 34.0 pg   MCHC 33.5 30.0 - 36.0 g/dL   RDW 41.6 (H) 60.6 - 30.1 %   Platelets 156 150 - 400 K/uL   nRBC 2.3 (H) 0.0 - 0.2 %    Comment: Performed at [facility], [address] [place], [place] [address]  Heparin level (unfractionated)     Status: Abnormal   Collection Time: 01/06/23  4:40 AM  Result Value Ref Range   Heparin Unfractionated <0.10 (L) 0.30 - 0.70 IU/mL    Comment: (NOTE) The clinical reportable range upper limit is being lowered to >1.10 to align with the FDA approved guidance for the current laboratory assay.  If heparin results are below expected values, and patient dosage has  been confirmed, suggest follow up testing of antithrombin III levels. Performed at [facility], [address], [place], [place] [address]   Glucose, capillary     Status: Abnormal   Collection Time: 01/06/23  7:18 AM  Result Value Ref Range  Glucose-Capillary 134 (H) 70 - 99 mg/dL    Comment: Glucose reference range applies only to samples taken after fasting for at least 8 hours.  Glucose, capillary     Status: Abnormal   Collection Time: 01/06/23 11:14 AM  Result Value Ref Range   Glucose-Capillary 148 (H) 70 - 99 mg/dL    Comment: Glucose reference range applies only to samples taken after fasting for at least 8 hours.    No results found for this or any previous visit (from the past 240 hour(s)).  Lipid Panel Recent Labs    01/05/23 0459  TRIG 52    Studies/Results: EEG adult  Result Date: 01/06/2023 Charlsie Quest, MD     01/06/2023  8:25 AM Patient Name: Peter Oneill MRN: [id number] Epilepsy Attending: Charlsie Quest Referring Physician/Provider: Caryl Pina, MD Date: 01/06/2023 Duration: 31.39 mins Patient history: 82yo M with ams getting eeg to  evaluate for seizure Level of alertness:  comatose/ lethargic AEDs during EEG study: GBP Technical aspects: This EEG study was done with scalp electrodes positioned according to the 10-20 International system of electrode placement. Electrical activity was reviewed with band pass filter of 1-70Hz , sensitivity of 7 uV/mm, display speed of 87mm/sec with a 60Hz  notched filter applied as appropriate. EEG data were recorded continuously and digitally stored.  Video monitoring was available and reviewed as appropriate. Description: EEG showed continuous generalized 3 to 6 Hz theta-delta slowing. Hyperventilation and photic stimulation were not performed.   ABNORMALITY - Continuous slow, generalized IMPRESSION: This study is suggestive of moderate to severe diffuse encephalopathy. No seizures or epileptiform discharges were seen throughout the recording. Charlsie Quest   MR BRAIN WO CONTRAST  Result Date: 01/06/2023 CLINICAL DATA:  Initial evaluation for neuro deficit, stroke. EXAM: MRI HEAD WITHOUT CONTRAST TECHNIQUE: Multiplanar, multiecho pulse sequences of the brain and surrounding structures were obtained without intravenous contrast. COMPARISON:  Prior studies from earlier the same day. FINDINGS: Brain: Cerebral volume within normal limits. Mild chronic microvascular ischemic disease for age. Encephalomalacia and gliosis at the anterior left frontal lobe, likely a remote left MCA territory infarct. No evidence for acute or subacute ischemia. No acute intracranial hemorrhage. Few scattered punctate chronic micro hemorrhages noted, likely small vessel related. No mass lesion, midline shift or mass effect. Mild ventricular prominence related to atrophy without hydrocephalus. No extra-axial fluid collection. Pituitary gland and suprasellar region within normal limits. Vascular: Major intracranial vascular flow voids are maintained. Skull and upper cervical spine: Craniocervical junction within normal limits. Bone  marrow signal intensity normal. No scalp soft tissue abnormality. Sinuses/Orbits: Globes and orbital soft tissues within normal limits. Paranasal sinuses are largely clear. Small to moderate bilateral mastoid effusions. Patient is intubated. Other: None. IMPRESSION: 1. No acute intracranial abnormality. 2. Remote left MCA territory infarct. 3. Underlying atrophy with mild chronic microvascular ischemic disease. Electronically Signed   By: Rise Mu M.D.   On: 01/06/2023 04:37   DG CHEST PORT 1 VIEW  Result Date: 01/06/2023 CLINICAL DATA:  [address] Hypoxemia [id number] EXAM: PORTABLE CHEST 1 VIEW COMPARISON:  Chest x-ray 01/03/2023, CT chest 12/25/2022 FINDINGS: Endotracheal tube approximately 6 cm above the carina. Enteric tube coursing below the hemidiaphragm with side port overlying the expected region of the gastric lumen and tip collimated off view. Right internal jugular central venous catheter with tip overlying the expected region of the distal superior vena cava. The heart and mediastinal contours are within normal limits. No focal consolidation. No pulmonary edema. Trace bilateral pleural effusions.  No pneumothorax. Redemonstration of bilateral pleural calcified plaques. No acute osseous abnormality. IMPRESSION: 1. Trace bilateral pleural effusions. 2. Chronic bilateral pleural calcified plaques. 3. Lines and tubes as above. Electronically Signed   By: Tish Frederickson M.D.   On: 01/06/2023 01:14   CT ANGIO HEAD NECK W WO CM (CODE STROKE)  Result Date: 01/05/2023 CLINICAL DATA:  Neuro deficit, acute, stroke suspected. Fix, dilated left pupil. Unresponsive. EXAM: CT ANGIOGRAPHY HEAD AND NECK WITH AND WITHOUT CONTRAST TECHNIQUE: Multidetector CT imaging of the head and neck was performed using the standard protocol during bolus administration of intravenous contrast. Multiplanar CT image reconstructions and MIPs were obtained to evaluate the vascular anatomy. Carotid stenosis measurements (when  applicable) are obtained utilizing NASCET criteria, using the distal internal carotid diameter as the denominator. RADIATION DOSE REDUCTION: This exam was performed according to the departmental dose-optimization program which includes automated exposure control, adjustment of the mA and/or kV according to patient size and/or use of iterative reconstruction technique. CONTRAST:  75mL ISOVUE-370 IOPAMIDOL (ISOVUE-370) INJECTION 76% COMPARISON:  None Available. FINDINGS: CTA NECK FINDINGS Aortic arch: Standard 3 vessel aortic arch with moderate atherosclerosis. No significant stenosis of the arch vessel origins. Right carotid system: Patent with calcified and soft plaque at the carotid bifurcation and in the carotid bulb. Less than 50% stenosis of the proximal ICA by NASCET criteria. Left carotid system: Patent with predominantly calcified plaque at the carotid bifurcation and in the carotid bulb resulting in 70% stenosis of the proximal ICA. Vertebral arteries: Patent with the left being mildly dominant. Atherosclerosis at both vertebral origins without evidence of significant stenosis. Skeleton: Mild-to-moderate cervical spondylosis and moderately advanced facet arthrosis. Other neck: No evidence of cervical lymphadenopathy or mass. Upper chest: Endotracheal tube terminating above the carina. Partially visualized enteric tube and right internal jugular central venous catheter. Large calcified pleural plaques. Review of the MIP images confirms the above findings CTA HEAD FINDINGS Anterior circulation: The internal carotid arteries are patent from skull base to carotid termini with extensive siphon atherosclerosis resulting in moderate multifocal stenoses bilaterally. ACAs and MCAs are patent without evidence of a proximal branch occlusion or significant proximal stenosis. No aneurysm is identified. Posterior circulation: The intracranial vertebral arteries are patent to the basilar with atherosclerotic plaque  resulting in severe right and mild left V4 stenoses. Patent PICA and SCA origins are visualized bilaterally. The basilar artery is patent with mild atherosclerotic irregularity but no significant stenosis. Posterior communicating arteries are diminutive or absent. The PCAs are patent with severe branch vessel stenoses. No aneurysm is identified. Venous sinuses: As permitted by contrast timing, patent. Anatomic variants: None. Review of the MIP images confirms the above findings These results were called by telephone at the time of interpretation on 01/05/2023 at 7:14 pm to Dr. Caryl Pina, who verbally acknowledged these results. IMPRESSION: 1. No large vessel occlusion. 2. Widespread atherosclerosis in the head and neck including 70% proximal left ICA stenosis, moderate bilateral intracranial ICA stenoses, and severe right V4 stenosis. 3.  Aortic Atherosclerosis (ICD10-I70.0). Electronically Signed   By: Sebastian Ache M.D.   On: 01/05/2023 19:30   CT HEAD CODE STROKE WO CONTRAST  Result Date: 01/05/2023 CLINICAL DATA:  Code stroke. Neuro deficit, acute, stroke suspected. Fixed, dilated left pupil. EXAM: CT HEAD WITHOUT CONTRAST TECHNIQUE: Contiguous axial images were obtained from the base of the skull through the vertex without intravenous contrast. RADIATION DOSE REDUCTION: This exam was performed according to the departmental dose-optimization program which includes automated exposure control,  adjustment of the mA and/or kV according to patient size and/or use of iterative reconstruction technique. COMPARISON:  None Available. FINDINGS: Brain: There is no evidence of an acute cortically based infarct, intracranial hemorrhage, mass, midline shift, or extra-axial fluid collection. Hypodensity in the left dorsal pons and middle cerebellar peduncle is favored to reflect artifact based on reformats. There is encephalomalacia anteriorly in the left frontal lobe with mild ex vacuo dilatation of the left frontal  horn. There is mild cerebral atrophy. Vascular: Calcified atherosclerosis at the skull base. No hyperdense vessel. Skull: No acute fracture or suspicious osseous lesion. Sinuses/Orbits: Small mucous retention cysts in the maxillary sinuses. Trace left mastoid fluid. Unremarkable orbits. Other: None. ASPECTS (Alberta Stroke Program Early CT Score) - Ganglionic level infarction (caudate, lentiform nuclei, internal capsule, insula, M1-M3 cortex): 7 - Supraganglionic infarction (M4-M6 cortex): 3 Total score (0-10 with 10 being normal): 10 These results were communicated to Dr. Otelia Limes at 6:56 pm on 01/05/2023 by text page via the [facility] messaging system. IMPRESSION: 1. No evidence of acute intracranial abnormality.  ASPECTS of 10. 2. Left frontal lobe encephalomalacia. Electronically Signed   By: Sebastian Ache M.D.   On: 01/05/2023 18:59    Medications: Scheduled:  arformoterol  15 mcg Nebulization BID   aspirin  81 mg Per Tube Daily   Chlorhexidine Gluconate Cloth  6 each Topical Daily   clopidogrel  75 mg Per Tube Daily   darbepoetin (ARANESP) injection - DIALYSIS  40 mcg Subcutaneous Q Mon-1800   doxercalciferol  4 mcg Intravenous Q M,W,F   feeding supplement (PROSource TF20)  60 mL Per Tube QID   gabapentin  300 mg Per Tube QHS   insulin aspart  0-15 Units Subcutaneous Q4H   latanoprost  1 drop Both Eyes QHS   metoCLOPramide (REGLAN) injection  5 mg Intravenous Q8H   midodrine  15 mg Per Tube Q8H   mouth rinse  15 mL Mouth Rinse 4 times per day   revefenacin  175 mcg Nebulization Daily   rosuvastatin  10 mg Per Tube Daily   sodium chloride flush  10-40 mL Intracatheter Q12H   Continuous:   prismasol BGK 4/2.5      prismasol BGK 4/2.5     famotidine (PEPCID) IV Stopped (01/06/23 0344)   TPN (CLINIMIX) Adult without lytes     And   fat emul(SMOFlipid)     feeding supplement (VITAL 1.5 CAL) 20 mL/hr at 01/06/23 1000   heparin     norepinephrine (LEVOPHED) Adult infusion 6 mcg/min (01/06/23  1000)   prismasol BGK 4/2.5     TPN (CLINIMIX) Adult without lytes 82 mL/hr at 01/06/23 1000   vasopressin 0.04 Units/min (01/06/23 1022)    Assessment: 82 y.o. male with an episode of acute unresponsiveness yesterday, most likely secondary to hypercarbia.   - Exam today is improved. Now following commands. Pupils are now equal and reactive. Continues to be intubated.   - CT head: No evidence of acute intracranial abnormality.  ASPECTS of 10. Left frontal lobe encephalomalacia. - CTA of head and neck: No large vessel occlusion. Widespread atherosclerosis in the head and neck including 70% proximal left ICA stenosis, moderate bilateral intracranial ICA stenoses, and severe right V4 stenosis. Aortic atherosclerosis. - MRI brain:  No acute intracranial abnormality. Remote left MCA territory infarct. Underlying atrophy with mild chronic microvascular ischemic disease. - Spot EEG official report: Continuous slow, generalized. This study is suggestive of moderate to severe diffuse encephalopathy. No seizures or epileptiform discharges were  seen throughout the recording. - Currently on ASA and Plavix - Acute worsening was most likely secondary to hypercarbic encephalopathy. Stroke ruled out by imaging. Subclinical status ruled out with EEG.    Recommendations: - Frequent neuro checks - Management of electrolytes and vent settings per CCM - Neurohospitalist service will sign off. Please call if there are additional questions.    35 minutes spent in the neurological evaluation and management of this critically ill patient.    LOS: 12 days   @Electronically  signed: Dr. Caryl Pina 01/06/2023  11:46 AM

## 2023-01-06 NOTE — Progress Notes (Addendum)
Patient ID: Peter Oneill, male   DOB: February 22, 1941, 82 y.o.   MRN: [id number] 10 Days Post-Op    Interval: Reintubated yesterday.  Concern for CVA, MRI just showed old infarct.  TFs started yesterday at 20cc/hr, but stopped at 1800 when he was more unresponsive.  Objective: Vital signs in last 24 hours: Temp:  [97.5 F (36.4 C)-98.2 F (36.8 C)] 98.2 F (36.8 C) (11/12 0719) Pulse Rate:  [49-84] 65 (11/12 0745) Resp:  [15-37] 32 (11/12 0745) BP: (94-149)/(28-91) 122/39 (11/12 0745) SpO2:  [90 %-100 %] 100 % (11/12 0745) FiO2 (%):  [40 %-100 %] 40 % (11/12 0727) Weight:  [84.7 kg] 84.7 kg (11/12 0500) Last BM Date : 01/05/23  Intake/Output from previous day: 11/11 0701 - 11/12 0700 In: 3556 [I.V.:2761.4; Blood:346.3; NG/GT:404.3; IV Piggyback:44] Out: 710 [Emesis/NG output:400; Drains:40; Stool:270] Intake/Output this shift: No intake/output data recorded.  General appearance: critically ill appearing on vent GI: soft, wound OK, fascia in tact, ileostomy mucosa difficult to see, appears to be sloughing some and a bit retracted, but having good stool output, ostomy appliance filled with liquid stool and air. Abdomen soft, drain with low amount of serosang output  NGT - 400cc yesterday  Ostomy - 270 cc  JP - 40 SS   Lab Results: I have personally reviewed all labs for past 24h.  Studies/Results: MR BRAIN WO CONTRAST  Result Date: 01/06/2023 CLINICAL DATA:  Initial evaluation for neuro deficit, stroke. EXAM: MRI HEAD WITHOUT CONTRAST TECHNIQUE: Multiplanar, multiecho pulse sequences of the brain and surrounding structures were obtained without intravenous contrast. COMPARISON:  Prior studies from earlier the same day. FINDINGS: Brain: Cerebral volume within normal limits. Mild chronic microvascular ischemic disease for age. Encephalomalacia and gliosis at the anterior left frontal lobe, likely a remote left MCA territory infarct. No evidence for acute or subacute ischemia. No acute  intracranial hemorrhage. Few scattered punctate chronic micro hemorrhages noted, likely small vessel related. No mass lesion, midline shift or mass effect. Mild ventricular prominence related to atrophy without hydrocephalus. No extra-axial fluid collection. Pituitary gland and suprasellar region within normal limits. Vascular: Major intracranial vascular flow voids are maintained. Skull and upper cervical spine: Craniocervical junction within normal limits. Bone marrow signal intensity normal. No scalp soft tissue abnormality. Sinuses/Orbits: Globes and orbital soft tissues within normal limits. Paranasal sinuses are largely clear. Small to moderate bilateral mastoid effusions. Patient is intubated. Other: None. IMPRESSION: 1. No acute intracranial abnormality. 2. Remote left MCA territory infarct. 3. Underlying atrophy with mild chronic microvascular ischemic disease. Electronically Signed   By: Rise Mu M.D.   On: 01/06/2023 04:37   DG CHEST PORT 1 VIEW  Result Date: 01/06/2023 CLINICAL DATA:  40981 Hypoxemia 19147 EXAM: PORTABLE CHEST 1 VIEW COMPARISON:  Chest x-ray 01/03/2023, CT chest 12/25/2022 FINDINGS: Endotracheal tube approximately 6 cm above the carina. Enteric tube coursing below the hemidiaphragm with side port overlying the expected region of the gastric lumen and tip collimated off view. Right internal jugular central venous catheter with tip overlying the expected region of the distal superior vena cava. The heart and mediastinal contours are within normal limits. No focal consolidation. No pulmonary edema. Trace bilateral pleural effusions. No pneumothorax. Redemonstration of bilateral pleural calcified plaques. No acute osseous abnormality. IMPRESSION: 1. Trace bilateral pleural effusions. 2. Chronic bilateral pleural calcified plaques. 3. Lines and tubes as above. Electronically Signed   By: Tish Frederickson M.D.   On: 01/06/2023 01:14   CT ANGIO HEAD NECK W WO CM (CODE  STROKE)  Result Date: 01/05/2023 CLINICAL DATA:  Neuro deficit, acute, stroke suspected. Fix, dilated left pupil. Unresponsive. EXAM: CT ANGIOGRAPHY HEAD AND NECK WITH AND WITHOUT CONTRAST TECHNIQUE: Multidetector CT imaging of the head and neck was performed using the standard protocol during bolus administration of intravenous contrast. Multiplanar CT image reconstructions and MIPs were obtained to evaluate the vascular anatomy. Carotid stenosis measurements (when applicable) are obtained utilizing NASCET criteria, using the distal internal carotid diameter as the denominator. RADIATION DOSE REDUCTION: This exam was performed according to the departmental dose-optimization program which includes automated exposure control, adjustment of the mA and/or kV according to patient size and/or use of iterative reconstruction technique. CONTRAST:  75mL ISOVUE-370 IOPAMIDOL (ISOVUE-370) INJECTION 76% COMPARISON:  None Available. FINDINGS: CTA NECK FINDINGS Aortic arch: Standard 3 vessel aortic arch with moderate atherosclerosis. No significant stenosis of the arch vessel origins. Right carotid system: Patent with calcified and soft plaque at the carotid bifurcation and in the carotid bulb. Less than 50% stenosis of the proximal ICA by NASCET criteria. Left carotid system: Patent with predominantly calcified plaque at the carotid bifurcation and in the carotid bulb resulting in 70% stenosis of the proximal ICA. Vertebral arteries: Patent with the left being mildly dominant. Atherosclerosis at both vertebral origins without evidence of significant stenosis. Skeleton: Mild-to-moderate cervical spondylosis and moderately advanced facet arthrosis. Other neck: No evidence of cervical lymphadenopathy or mass. Upper chest: Endotracheal tube terminating above the carina. Partially visualized enteric tube and right internal jugular central venous catheter. Large calcified pleural plaques. Review of the MIP images confirms the  above findings CTA HEAD FINDINGS Anterior circulation: The internal carotid arteries are patent from skull base to carotid termini with extensive siphon atherosclerosis resulting in moderate multifocal stenoses bilaterally. ACAs and MCAs are patent without evidence of a proximal branch occlusion or significant proximal stenosis. No aneurysm is identified. Posterior circulation: The intracranial vertebral arteries are patent to the basilar with atherosclerotic plaque resulting in severe right and mild left V4 stenoses. Patent PICA and SCA origins are visualized bilaterally. The basilar artery is patent with mild atherosclerotic irregularity but no significant stenosis. Posterior communicating arteries are diminutive or absent. The PCAs are patent with severe branch vessel stenoses. No aneurysm is identified. Venous sinuses: As permitted by contrast timing, patent. Anatomic variants: None. Review of the MIP images confirms the above findings These results were called by telephone at the time of interpretation on 01/05/2023 at 7:14 pm to Dr. Caryl Pina, who verbally acknowledged these results. IMPRESSION: 1. No large vessel occlusion. 2. Widespread atherosclerosis in the head and neck including 70% proximal left ICA stenosis, moderate bilateral intracranial ICA stenoses, and severe right V4 stenosis. 3.  Aortic Atherosclerosis (ICD10-I70.0). Electronically Signed   By: Sebastian Ache M.D.   On: 01/05/2023 19:30   CT HEAD CODE STROKE WO CONTRAST  Result Date: 01/05/2023 CLINICAL DATA:  Code stroke. Neuro deficit, acute, stroke suspected. Fixed, dilated left pupil. EXAM: CT HEAD WITHOUT CONTRAST TECHNIQUE: Contiguous axial images were obtained from the base of the skull through the vertex without intravenous contrast. RADIATION DOSE REDUCTION: This exam was performed according to the departmental dose-optimization program which includes automated exposure control, adjustment of the mA and/or kV according to patient  size and/or use of iterative reconstruction technique. COMPARISON:  None Available. FINDINGS: Brain: There is no evidence of an acute cortically based infarct, intracranial hemorrhage, mass, midline shift, or extra-axial fluid collection. Hypodensity in the left dorsal pons and middle cerebellar peduncle is favored to  reflect artifact based on reformats. There is encephalomalacia anteriorly in the left frontal lobe with mild ex vacuo dilatation of the left frontal horn. There is mild cerebral atrophy. Vascular: Calcified atherosclerosis at the skull base. No hyperdense vessel. Skull: No acute fracture or suspicious osseous lesion. Sinuses/Orbits: Small mucous retention cysts in the maxillary sinuses. Trace left mastoid fluid. Unremarkable orbits. Other: None. ASPECTS (Alberta Stroke Program Early CT Score) - Ganglionic level infarction (caudate, lentiform nuclei, internal capsule, insula, M1-M3 cortex): 7 - Supraganglionic infarction (M4-M6 cortex): 3 Total score (0-10 with 10 being normal): 10 These results were communicated to Dr. Otelia Limes at 6:56 pm on 01/05/2023 by text page via the [facility] messaging system. IMPRESSION: 1. No evidence of acute intracranial abnormality.  ASPECTS of 10. 2. Left frontal lobe encephalomalacia. Electronically Signed   By: Sebastian Ache M.D.   On: 01/05/2023 18:59   DG Abd Portable 1V  Result Date: 01/04/2023 CLINICAL DATA:  Emesis. EXAM: PORTABLE ABDOMEN - 1 VIEW COMPARISON:  CT abdomen pelvis dated 12/25/2022. FINDINGS: The bowel gas pattern is normal. Air-fluid levels and free intraperitoneal air can not be excluded on the supine exam. A catheter overlies the pelvis. No radiopaque calculus in the expected location of the urinary tract. Significant degenerative changes are seen in the lumbar spine. There are severe degenerative changes of the right hip with deformity. Moderate degenerative changes are seen in the left hip. IMPRESSION: Nonobstructive bowel gas pattern.  Electronically Signed   By: Romona Curls M.D.   On: 01/04/2023 13:45     Assessment/Plan: POD 10, s/p right hemicolectomy with ileostomy secondary to Acute mesenteric ischemia by Dr. Azucena Cecil 11/1 just after midnight. Left in discontinuity with abthera. Also underwent revascularization with angioplasty of SMA by Dr. Lenell Antu.  -S/p washout, end ileostomy, JP drain placement and abdominal closure 11/2 - afebrile, decreasing pressor requirement, WBC decreased to 11K - on plavix - TF held Saturday due to emesis.  NGT currently clamped - will restart low dose TFs at 20cc/hr today after held yesterday due to tenuous respiratory status requiring reintubation. -d/w primary service at the bedside.  Family wants full code and are discussing possible trach, etc.   FEN - NPO, TFs at 20cc/hr, TPN DVT - SCDs, Plavix Dispo - ICU    LOS: 12 days    Letha Cape, [facility] Please see Amion for pager number during day hours 7:00am-4:30pm       01/06/2023

## 2023-01-06 NOTE — Progress Notes (Signed)
Dr. Wilford Corner contacted for st read. Melynda Ripple to be notified if needed.

## 2023-01-06 NOTE — Progress Notes (Signed)
PHARMACY - ANTICOAGULATION CONSULT NOTE  Pharmacy Consult for Atrial Fibrillation  Indication: atrial fibrillation  Allergies  Allergen Reactions   Atorvastatin Other (See Comments)    Muscle aches    Patient Measurements: Height: 5\' 9"  (175.3 cm) Weight: 84.7 kg (186 lb 11.7 oz) IBW/kg (Calculated) : 70.7  Vital Signs: Temp: 98.2 F (36.8 C) (11/12 1115) Temp Source: Axillary (11/12 1115) BP: 132/53 (11/12 1030) Pulse Rate: 70 (11/12 1045)  Labs: Recent Labs    01/04/23 1500 01/04/23 1559 01/05/23 0459 01/05/23 0559 01/05/23 1152 01/05/23 1818 01/06/23 0440  HGB  --    < > 6.2* 6.0* 8.2* 8.3* 8.3*  HCT  --    < > 20.0* 19.3* 25.8* 27.0* 24.8*  PLT  --   --  176 160  --  170 156  HEPARINUNFRC 0.47  --  0.21*  --   --   --  <0.10*  CREATININE  --    < > 1.79*  --   --  2.58* 2.86*   < > = values in this interval not displayed.    Estimated Creatinine Clearance: 20.3 mL/min (A) (by C-G formula based on SCr of 2.86 mg/dL (H)).   Medical History: Past Medical History:  Diagnosis Date   Asbestosis (HCC)    Contraindication to percutaneous coronary intervention (PCI)    Coronary artery disease    Dyslipidemia    Dyspnea    ESRD on hemodialysis (HCC)    History of anemia    Hypercholesterolemia    Hypertension    Ileus (HCC)    Pleural effusion on left    Pneumonia    Sleep apnea     Medications:  Scheduled:   arformoterol  15 mcg Nebulization BID   aspirin  81 mg Per Tube Daily   Chlorhexidine Gluconate Cloth  6 each Topical Daily   clopidogrel  75 mg Per Tube Daily   darbepoetin (ARANESP) injection - DIALYSIS  40 mcg Subcutaneous Q Mon-1800   doxercalciferol  4 mcg Intravenous Q M,W,F   feeding supplement (PROSource TF20)  60 mL Per Tube QID   gabapentin  300 mg Per Tube QHS   insulin aspart  0-15 Units Subcutaneous Q4H   latanoprost  1 drop Both Eyes QHS   metoCLOPramide (REGLAN) injection  5 mg Intravenous Q8H   midodrine  15 mg Per Tube Q8H    mouth rinse  15 mL Mouth Rinse 4 times per day   revefenacin  175 mcg Nebulization Daily   rosuvastatin  10 mg Per Tube Daily   sodium chloride flush  10-40 mL Intracatheter Q12H   Infusions:    prismasol BGK 4/2.5      prismasol BGK 4/2.5     famotidine (PEPCID) IV Stopped (01/06/23 0344)   TPN (CLINIMIX) Adult without lytes     And   fat emul(SMOFlipid)     feeding supplement (VITAL 1.5 CAL) 20 mL/hr at 01/06/23 1000   norepinephrine (LEVOPHED) Adult infusion 6 mcg/min (01/06/23 1000)   prismasol BGK 4/2.5     TPN (CLINIMIX) Adult without lytes 82 mL/hr at 01/06/23 1000   vasopressin 0.04 Units/min (01/06/23 1022)    Assessment: 82 y/o M with mesenteric ischemia s/p OR with vascular and general surgery. S/P angioplasty and stenting of the SMA and right hemi-colectomy.  Resumed heparin infusion for atrial fibrillation on 11/9 after being off since 11/7. Heparin was discontinued on 11/10 due to acute drop in Hgb and patient received 2u pRBC. Pharmacy consulted  to restart heparin infusion for AFib on 11/12.  Hgb 8.3, Plt 156 - stable No s/sx of bleeding  Goal of Therapy:  Heparin level 0.3-0.7 units/ml Monitor platelets by anticoagulation protocol: Yes   Plan:  Restart heparin infusion at 1250 units/hr Check heparin level in 8 hours  Monitor daily CBC, heparin level, and for s/sx of bleeding   Thank you for allowing pharmacy to participate in this patient's care,  Wilburn Cornelia, PharmD, BCPS Clinical Pharmacist 01/06/2023 11:28 AM   Please refer to [facility] for pharmacy phone number

## 2023-01-06 NOTE — Plan of Care (Signed)
Preliminary review of bedside EEG negative for seizures. Formal report in the morning.

## 2023-01-06 NOTE — Plan of Care (Signed)

## 2023-01-07 DIAGNOSIS — J9601 Acute respiratory failure with hypoxia: Secondary | ICD-10-CM | POA: Diagnosis not present

## 2023-01-07 LAB — HEPATIC FUNCTION PANEL
ALT: 106 U/L — ABNORMAL HIGH (ref 0–44)
AST: 205 U/L — ABNORMAL HIGH (ref 15–41)
Albumin: 2.3 g/dL — ABNORMAL LOW (ref 3.5–5.0)
Alkaline Phosphatase: 257 U/L — ABNORMAL HIGH (ref 38–126)
Bilirubin, Direct: 2 mg/dL — ABNORMAL HIGH (ref 0.0–0.2)
Indirect Bilirubin: 0.8 mg/dL (ref 0.3–0.9)
Total Bilirubin: 2.8 mg/dL — ABNORMAL HIGH (ref <1.2)
Total Protein: 5.6 g/dL — ABNORMAL LOW (ref 6.5–8.1)

## 2023-01-07 LAB — RENAL FUNCTION PANEL
Albumin: 2.3 g/dL — ABNORMAL LOW (ref 3.5–5.0)
Albumin: 2.4 g/dL — ABNORMAL LOW (ref 3.5–5.0)
Anion gap: 10 (ref 5–15)
Anion gap: 10 (ref 5–15)
BUN: 86 mg/dL — ABNORMAL HIGH (ref 8–23)
BUN: 66 mg/dL — ABNORMAL HIGH (ref 8–23)
CO2: 20 mmol/L — ABNORMAL LOW (ref 22–32)
CO2: 20 mmol/L — ABNORMAL LOW (ref 22–32)
Calcium: 8.7 mg/dL — ABNORMAL LOW (ref 8.9–10.3)
Calcium: 8.7 mg/dL — ABNORMAL LOW (ref 8.9–10.3)
Chloride: 99 mmol/L (ref 98–111)
Chloride: 100 mmol/L (ref 98–111)
Creatinine, Ser: 2.34 mg/dL — ABNORMAL HIGH (ref 0.61–1.24)
Creatinine, Ser: 1.63 mg/dL — ABNORMAL HIGH (ref 0.61–1.24)
GFR, Estimated: 27 mL/min — ABNORMAL LOW (ref >60)
GFR, Estimated: 42 mL/min — ABNORMAL LOW (ref >60)
Glucose, Bld: 140 mg/dL — ABNORMAL HIGH (ref 70–99)
Glucose, Bld: 154 mg/dL — ABNORMAL HIGH (ref 70–99)
Phosphorus: 2.3 mg/dL — ABNORMAL LOW (ref 2.5–4.6)
Phosphorus: 2.9 mg/dL (ref 2.5–4.6)
Potassium: 3.7 mmol/L (ref 3.5–5.1)
Potassium: 4.5 mmol/L (ref 3.5–5.1)
Sodium: 129 mmol/L — ABNORMAL LOW (ref 135–145)
Sodium: 130 mmol/L — ABNORMAL LOW (ref 135–145)

## 2023-01-07 LAB — GLUCOSE, CAPILLARY
Glucose-Capillary: 120 mg/dL — ABNORMAL HIGH (ref 70–99)
Glucose-Capillary: 133 mg/dL — ABNORMAL HIGH (ref 70–99)
Glucose-Capillary: 148 mg/dL — ABNORMAL HIGH (ref 70–99)
Glucose-Capillary: 151 mg/dL — ABNORMAL HIGH (ref 70–99)
Glucose-Capillary: 154 mg/dL — ABNORMAL HIGH (ref 70–99)
Glucose-Capillary: 156 mg/dL — ABNORMAL HIGH (ref 70–99)

## 2023-01-07 LAB — BASIC METABOLIC PANEL
Anion gap: 9 (ref 5–15)
BUN: 74 mg/dL — ABNORMAL HIGH (ref 8–23)
CO2: 21 mmol/L — ABNORMAL LOW (ref 22–32)
Calcium: 8.8 mg/dL — ABNORMAL LOW (ref 8.9–10.3)
Chloride: 101 mmol/L (ref 98–111)
Creatinine, Ser: 1.85 mg/dL — ABNORMAL HIGH (ref 0.61–1.24)
GFR, Estimated: 36 mL/min — ABNORMAL LOW (ref >60)
Glucose, Bld: 142 mg/dL — ABNORMAL HIGH (ref 70–99)
Potassium: 3.8 mmol/L (ref 3.5–5.1)
Sodium: 131 mmol/L — ABNORMAL LOW (ref 135–145)

## 2023-01-07 LAB — POCT I-STAT EG7
Acid-base deficit: 4 mmol/L — ABNORMAL HIGH (ref 0.0–2.0)
Bicarbonate: 23.4 mmol/L (ref 20.0–28.0)
Calcium, Ion: 1.28 mmol/L (ref 1.15–1.40)
HCT: 26 % — ABNORMAL LOW (ref 39.0–52.0)
Hemoglobin: 8.8 g/dL — ABNORMAL LOW (ref 13.0–17.0)
O2 Saturation: 81 %
Patient temperature: 99.5
Potassium: 3.9 mmol/L (ref 3.5–5.1)
Sodium: 132 mmol/L — ABNORMAL LOW (ref 135–145)
TCO2: 25 mmol/L (ref 22–32)
pCO2, Ven: 53.9 mm[Hg] (ref 44–60)
pH, Ven: 7.249 — ABNORMAL LOW (ref 7.25–7.43)
pO2, Ven: 54 mm[Hg] — ABNORMAL HIGH (ref 32–45)

## 2023-01-07 LAB — HEPARIN LEVEL (UNFRACTIONATED)
Heparin Unfractionated: 0.13 [IU]/mL — ABNORMAL LOW (ref 0.30–0.70)
Heparin Unfractionated: 0.22 [IU]/mL — ABNORMAL LOW (ref 0.30–0.70)
Heparin Unfractionated: 0.34 [IU]/mL (ref 0.30–0.70)

## 2023-01-07 LAB — CBC
HCT: 23.9 % — ABNORMAL LOW (ref 39.0–52.0)
Hemoglobin: 7.8 g/dL — ABNORMAL LOW (ref 13.0–17.0)
MCH: 30.5 pg (ref 26.0–34.0)
MCHC: 32.6 g/dL (ref 30.0–36.0)
MCV: 93.4 fL (ref 80.0–100.0)
Platelets: 152 10*3/uL (ref 150–400)
RBC: 2.56 MIL/uL — ABNORMAL LOW (ref 4.22–5.81)
RDW: 19.6 % — ABNORMAL HIGH (ref 11.5–15.5)
WBC: 9.3 10*3/uL (ref 4.0–10.5)
nRBC: 1 % — ABNORMAL HIGH (ref 0.0–0.2)

## 2023-01-07 LAB — HEMOGLOBIN AND HEMATOCRIT, BLOOD
HCT: 26.1 % — ABNORMAL LOW (ref 39.0–52.0)
Hemoglobin: 8.5 g/dL — ABNORMAL LOW (ref 13.0–17.0)

## 2023-01-07 LAB — MAGNESIUM: Magnesium: 2.3 mg/dL (ref 1.7–2.4)

## 2023-01-07 MED ORDER — POTASSIUM CHLORIDE 10 MEQ/50ML IV SOLN
10.0000 meq | INTRAVENOUS | Status: AC
  Administered 2023-01-07 (×4): 10 meq via INTRAVENOUS
  Filled 2023-01-07 (×3): qty 50

## 2023-01-07 MED ORDER — SODIUM PHOSPHATES 45 MMOLE/15ML IV SOLN
15.0000 mmol | Freq: Once | INTRAVENOUS | Status: AC
  Administered 2023-01-07: 15 mmol via INTRAVENOUS
  Filled 2023-01-07: qty 5

## 2023-01-07 MED ORDER — VITAL 1.5 CAL PO LIQD
1000.0000 mL | ORAL | Status: DC
  Administered 2023-01-07 – 2023-01-08 (×2): 1000 mL

## 2023-01-07 MED ORDER — TRACE MINERALS CU-MN-SE-ZN 300-55-60-3000 MCG/ML IV SOLN
INTRAVENOUS | Status: AC
  Filled 2023-01-07: qty 2000

## 2023-01-07 MED ORDER — FAT EMUL FISH OIL/PLANT BASED 20% (SMOFLIPID)IV EMUL
250.0000 mL | INTRAVENOUS | Status: AC
  Administered 2023-01-07: 250 mL via INTRAVENOUS
  Filled 2023-01-07: qty 250

## 2023-01-07 NOTE — Progress Notes (Signed)
Md notified about reoccurring PVCs. Labs ordered w/ meds per MD

## 2023-01-07 NOTE — Progress Notes (Signed)
PHARMACY - ANTICOAGULATION CONSULT NOTE  Pharmacy Consult for Atrial Fibrillation  Indication: atrial fibrillation  Allergies  Allergen Reactions   Atorvastatin Other (See Comments)    Muscle aches    Patient Measurements: Height: 5\' 9"  (175.3 cm) Weight: 85.4 kg (188 lb 4.4 oz) IBW/kg (Calculated) : 70.7  Vital Signs: Temp: 98.2 F (36.8 C) (11/13 1143) Temp Source: Axillary (11/13 1143) BP: 129/43 (11/13 1400) Pulse Rate: 68 (11/13 1400)  Labs: Recent Labs    01/05/23 1818 01/06/23 0440 01/06/23 1600 01/06/23 1929 01/07/23 0401 01/07/23 1040 01/07/23 1043 01/07/23 1259  HGB 8.3* 8.3*  --   --  7.8* 8.8*  --   --   HCT 27.0* 24.8*  --   --  23.9* 26.0*  --   --   PLT 170 156  --   --  152  --   --   --   HEPARINUNFRC  --  <0.10*  --  0.12* 0.13*  --   --  0.22*  CREATININE 2.58* 2.86* 2.91*  --  2.34*  --  1.85*  --     Estimated Creatinine Clearance: 33.9 mL/min (A) (by C-G formula based on SCr of 1.85 mg/dL (H)).   Medical History: Past Medical History:  Diagnosis Date   Asbestosis (HCC)    Contraindication to percutaneous coronary intervention (PCI)    Coronary artery disease    Dyslipidemia    Dyspnea    ESRD on hemodialysis (HCC)    History of anemia    Hypercholesterolemia    Hypertension    Ileus (HCC)    Pleural effusion on left    Pneumonia    Sleep apnea     Medications:  Scheduled:   arformoterol  15 mcg Nebulization BID   aspirin  81 mg Per Tube Daily   Chlorhexidine Gluconate Cloth  6 each Topical Daily   clopidogrel  75 mg Per Tube Daily   darbepoetin (ARANESP) injection - DIALYSIS  40 mcg Subcutaneous Q Mon-1800   doxercalciferol  4 mcg Intravenous Q M,W,F   feeding supplement (PROSource TF20)  60 mL Per Tube QID   gabapentin  300 mg Per Tube QHS   insulin aspart  0-15 Units Subcutaneous Q4H   latanoprost  1 drop Both Eyes QHS   metoCLOPramide (REGLAN) injection  5 mg Intravenous Q8H   midodrine  15 mg Per Tube Q8H   mouth  rinse  15 mL Mouth Rinse Q2H   revefenacin  175 mcg Nebulization Daily   rosuvastatin  10 mg Per Tube Daily   sodium chloride flush  10-40 mL Intracatheter Q12H   Infusions:    prismasol BGK 4/2.5 400 mL/hr at 01/07/23 1338    prismasol BGK 4/2.5 400 mL/hr at 01/07/23 1338   famotidine (PEPCID) IV 20 mg (01/07/23 0308)   TPN (CLINIMIX) Adult with Electrolyte Additives     And   fat emul(SMOFlipid)     feeding supplement (VITAL 1.5 CAL) 30 mL/hr at 01/07/23 1400   heparin 1,600 Units/hr (01/07/23 1400)   norepinephrine (LEVOPHED) Adult infusion 6 mcg/min (01/07/23 1400)   potassium chloride 50 mL/hr at 01/07/23 1400   prismasol BGK 4/2.5 1,500 mL/hr at 01/07/23 1251   sodium phosphate 15 mmol in dextrose 5 % 250 mL infusion 43 mL/hr at 01/07/23 1400   TPN (CLINIMIX) Adult without lytes 82 mL/hr at 01/07/23 1400   vasopressin 0.04 Units/min (01/07/23 1400)    Assessment: 82 y/o M with mesenteric ischemia s/p OR with vascular  and general surgery. S/P angioplasty and stenting of the SMA and right hemi-colectomy.  Resumed heparin infusion for atrial fibrillation on 11/9 after being off since 11/7. Heparin was discontinued on 11/10 due to acute drop in Hgb and patient received 2u pRBC. Pharmacy consulted to restart heparin infusion for AFib on 11/12.  Heparin level 0.22, subtherapeutic, on heparin infusion at 1600 units/hr No issues with heparin infusing or bleeding noted per d/w RN.   Goal of Therapy:  Heparin level 0.3-0.7 units/ml Monitor platelets by anticoagulation protocol: Yes   Plan:  Increase heparin infusion to 1800 units/hr Check heparin level in 8 hours  Monitor daily CBC, heparin level, and for s/sx of bleeding   Thank you for allowing pharmacy to participate in this patient's care,  Wilburn Cornelia, PharmD, BCPS Clinical Pharmacist 01/07/2023 2:10 PM   Please refer to [facility] for pharmacy phone number

## 2023-01-07 NOTE — Progress Notes (Signed)
PHARMACY - ANTICOAGULATION CONSULT NOTE  Pharmacy Consult for Atrial Fibrillation  Indication: atrial fibrillation  Allergies  Allergen Reactions   Atorvastatin Other (See Comments)    Muscle aches    Patient Measurements: Height: 5\' 9"  (175.3 cm) Weight: 85.4 kg (188 lb 4.4 oz) IBW/kg (Calculated) : 70.7   Vital Signs: Temp: 98.6 F (37 C) (11/13 0343) Temp Source: Axillary (11/13 0343) BP: 102/36 (11/13 0430) Pulse Rate: 66 (11/13 0430)  Labs: Recent Labs    01/05/23 1818 01/06/23 0440 01/06/23 1600 01/06/23 1929 01/07/23 0401  HGB 8.3* 8.3*  --   --  7.8*  HCT 27.0* 24.8*  --   --  23.9*  PLT 170 156  --   --  152  HEPARINUNFRC  --  <0.10*  --  0.12* 0.13*  CREATININE 2.58* 2.86* 2.91*  --   --     Estimated Creatinine Clearance: 21.6 mL/min (A) (by C-G formula based on SCr of 2.91 mg/dL (H)).   Assessment: 82 y/o M with mesenteric ischemia s/p OR with vascular and general surgery. S/P angioplasty and stenting of the SMA and right hemi-colectomy.  Resumed heparin infusion for atrial fibrillation on 11/9 after being off since 11/7. Heparin was discontinued on 11/10 due to acute drop in Hgb and patient received 2u pRBC. Pharmacy consulted to restart heparin infusion for AFib on 11/12.  11/13 AM UPDATE: - Heparin level subtherapeutic at 0.13 after dose increase from 1250 units/hr to 1400 units/hr . No issues with heparin infusing or bleeding noted per d/w RN. Hgb 8.3>7.8 and plts 150s  Goal of Therapy:  Heparin level 0.3-0.7 units/ml Monitor platelets by anticoagulation protocol: Yes   Plan:  Increase heparin infusion to 1600 units/hr Check heparin level in 8 hours  Monitor daily CBC, heparin level, and for s/sx of bleeding   Thank you for allowing pharmacy to participate in this patient's care,  Arabella Merles, PharmD. Clinical Pharmacist 01/07/2023 4:57 AM

## 2023-01-07 NOTE — Progress Notes (Signed)
Patient ID: Peter Oneill, male   DOB: Feb 09, 1941, 82 y.o.   MRN: [id number] 11 Days Post-Op    Interval: Awake on vent.  Tolerating 20cc/hr of TFs thus far.  Ostomy output.  No vomiting documented or seen per nursing.  Objective: Vital signs in last 24 hours: Temp:  [98.1 F (36.7 C)-98.8 F (37.1 C)] 98.6 F (37 C) (11/13 0343) Pulse Rate:  [58-81] 64 (11/13 0800) Resp:  [19-37] 22 (11/13 0800) BP: (97-149)/(29-95) 118/34 (11/13 0800) SpO2:  [94 %-100 %] 100 % (11/13 0800) FiO2 (%):  [40 %] 40 % (11/13 0724) Weight:  [85.4 kg] 85.4 kg (11/13 0345) Last BM Date : 01/07/23  Intake/Output from previous day: 11/12 0701 - 11/13 0700 In: 3963.6 [I.V.:2972.7; NG/GT:687.7; IV Piggyback:303.3] Out: 3997 [Drains:30; Stool:200] Intake/Output this shift: Total I/O In: 131.8 [I.V.:111.8; NG/GT:20] Out: 230 [Stool:50]  General appearance: critically ill appearing on vent GI: soft, wound OK, fascia in tact, ileostomy mucosa difficult to see, appears to be sloughing some and a bit retracted, but having good stool output, ostomy appliance filled with liquid stool and air. Abdomen soft, drain with low amount of serosang output  Ostomy - 200 cc  JP - 30 SS   Lab Results: I have personally reviewed all labs for past 24h.  Studies/Results: EEG adult  Result Date: 01/06/2023 Charlsie Quest, MD     01/06/2023  8:25 AM Patient Name: Peter Oneill MRN: [id number] Epilepsy Attending: Charlsie Quest Referring Physician/Provider: Caryl Pina, MD Date: 01/06/2023 Duration: 31.39 mins Patient history: 82yo M with ams getting eeg to evaluate for seizure Level of alertness:  comatose/ lethargic AEDs during EEG study: GBP Technical aspects: This EEG study was done with scalp electrodes positioned according to the 10-20 International system of electrode placement. Electrical activity was reviewed with band pass filter of 1-70Hz , sensitivity of 7 uV/mm, display speed of 70mm/sec with a 60Hz  notched  filter applied as appropriate. EEG data were recorded continuously and digitally stored.  Video monitoring was available and reviewed as appropriate. Description: EEG showed continuous generalized 3 to 6 Hz theta-delta slowing. Hyperventilation and photic stimulation were not performed.   ABNORMALITY - Continuous slow, generalized IMPRESSION: This study is suggestive of moderate to severe diffuse encephalopathy. No seizures or epileptiform discharges were seen throughout the recording. Charlsie Quest   MR BRAIN WO CONTRAST  Result Date: 01/06/2023 CLINICAL DATA:  Initial evaluation for neuro deficit, stroke. EXAM: MRI HEAD WITHOUT CONTRAST TECHNIQUE: Multiplanar, multiecho pulse sequences of the brain and surrounding structures were obtained without intravenous contrast. COMPARISON:  Prior studies from earlier the same day. FINDINGS: Brain: Cerebral volume within normal limits. Mild chronic microvascular ischemic disease for age. Encephalomalacia and gliosis at the anterior left frontal lobe, likely a remote left MCA territory infarct. No evidence for acute or subacute ischemia. No acute intracranial hemorrhage. Few scattered punctate chronic micro hemorrhages noted, likely small vessel related. No mass lesion, midline shift or mass effect. Mild ventricular prominence related to atrophy without hydrocephalus. No extra-axial fluid collection. Pituitary gland and suprasellar region within normal limits. Vascular: Major intracranial vascular flow voids are maintained. Skull and upper cervical spine: Craniocervical junction within normal limits. Bone marrow signal intensity normal. No scalp soft tissue abnormality. Sinuses/Orbits: Globes and orbital soft tissues within normal limits. Paranasal sinuses are largely clear. Small to moderate bilateral mastoid effusions. Patient is intubated. Other: None. IMPRESSION: 1. No acute intracranial abnormality. 2. Remote left MCA territory infarct. 3. Underlying atrophy with  mild chronic microvascular  ischemic disease. Electronically Signed   By: Rise Mu M.D.   On: 01/06/2023 04:37   DG CHEST PORT 1 VIEW  Result Date: 01/06/2023 CLINICAL DATA:  [id number] Hypoxemia [id number] EXAM: PORTABLE CHEST 1 VIEW COMPARISON:  Chest x-ray 01/03/2023, CT chest 12/25/2022 FINDINGS: Endotracheal tube approximately 6 cm above the carina. Enteric tube coursing below the hemidiaphragm with side port overlying the expected region of the gastric lumen and tip collimated off view. Right internal jugular central venous catheter with tip overlying the expected region of the distal superior vena cava. The heart and mediastinal contours are within normal limits. No focal consolidation. No pulmonary edema. Trace bilateral pleural effusions. No pneumothorax. Redemonstration of bilateral pleural calcified plaques. No acute osseous abnormality. IMPRESSION: 1. Trace bilateral pleural effusions. 2. Chronic bilateral pleural calcified plaques. 3. Lines and tubes as above. Electronically Signed   By: Tish Frederickson M.D.   On: 01/06/2023 01:14   CT ANGIO HEAD NECK W WO CM (CODE STROKE)  Result Date: 01/05/2023 CLINICAL DATA:  Neuro deficit, acute, stroke suspected. Fix, dilated left pupil. Unresponsive. EXAM: CT ANGIOGRAPHY HEAD AND NECK WITH AND WITHOUT CONTRAST TECHNIQUE: Multidetector CT imaging of the head and neck was performed using the standard protocol during bolus administration of intravenous contrast. Multiplanar CT image reconstructions and MIPs were obtained to evaluate the vascular anatomy. Carotid stenosis measurements (when applicable) are obtained utilizing NASCET criteria, using the distal internal carotid diameter as the denominator. RADIATION DOSE REDUCTION: This exam was performed according to the departmental dose-optimization program which includes automated exposure control, adjustment of the mA and/or kV according to patient size and/or use of iterative reconstruction technique.  CONTRAST:  75mL ISOVUE-370 IOPAMIDOL (ISOVUE-370) INJECTION 76% COMPARISON:  None Available. FINDINGS: CTA NECK FINDINGS Aortic arch: Standard 3 vessel aortic arch with moderate atherosclerosis. No significant stenosis of the arch vessel origins. Right carotid system: Patent with calcified and soft plaque at the carotid bifurcation and in the carotid bulb. Less than 50% stenosis of the proximal ICA by NASCET criteria. Left carotid system: Patent with predominantly calcified plaque at the carotid bifurcation and in the carotid bulb resulting in 70% stenosis of the proximal ICA. Vertebral arteries: Patent with the left being mildly dominant. Atherosclerosis at both vertebral origins without evidence of significant stenosis. Skeleton: Mild-to-moderate cervical spondylosis and moderately advanced facet arthrosis. Other neck: No evidence of cervical lymphadenopathy or mass. Upper chest: Endotracheal tube terminating above the carina. Partially visualized enteric tube and right internal jugular central venous catheter. Large calcified pleural plaques. Review of the MIP images confirms the above findings CTA HEAD FINDINGS Anterior circulation: The internal carotid arteries are patent from skull base to carotid termini with extensive siphon atherosclerosis resulting in moderate multifocal stenoses bilaterally. ACAs and MCAs are patent without evidence of a proximal branch occlusion or significant proximal stenosis. No aneurysm is identified. Posterior circulation: The intracranial vertebral arteries are patent to the basilar with atherosclerotic plaque resulting in severe right and mild left V4 stenoses. Patent PICA and SCA origins are visualized bilaterally. The basilar artery is patent with mild atherosclerotic irregularity but no significant stenosis. Posterior communicating arteries are diminutive or absent. The PCAs are patent with severe branch vessel stenoses. No aneurysm is identified. Venous sinuses: As permitted  by contrast timing, patent. Anatomic variants: None. Review of the MIP images confirms the above findings These results were called by telephone at the time of interpretation on 01/05/2023 at 7:14 pm to Dr. Caryl Pina, who verbally acknowledged these results. IMPRESSION: 1.  No large vessel occlusion. 2. Widespread atherosclerosis in the head and neck including 70% proximal left ICA stenosis, moderate bilateral intracranial ICA stenoses, and severe right V4 stenosis. 3.  Aortic Atherosclerosis (ICD10-I70.0). Electronically Signed   By: Sebastian Ache M.D.   On: 01/05/2023 19:30   CT HEAD CODE STROKE WO CONTRAST  Result Date: 01/05/2023 CLINICAL DATA:  Code stroke. Neuro deficit, acute, stroke suspected. Fixed, dilated left pupil. EXAM: CT HEAD WITHOUT CONTRAST TECHNIQUE: Contiguous axial images were obtained from the base of the skull through the vertex without intravenous contrast. RADIATION DOSE REDUCTION: This exam was performed according to the departmental dose-optimization program which includes automated exposure control, adjustment of the mA and/or kV according to patient size and/or use of iterative reconstruction technique. COMPARISON:  None Available. FINDINGS: Brain: There is no evidence of an acute cortically based infarct, intracranial hemorrhage, mass, midline shift, or extra-axial fluid collection. Hypodensity in the left dorsal pons and middle cerebellar peduncle is favored to reflect artifact based on reformats. There is encephalomalacia anteriorly in the left frontal lobe with mild ex vacuo dilatation of the left frontal horn. There is mild cerebral atrophy. Vascular: Calcified atherosclerosis at the skull base. No hyperdense vessel. Skull: No acute fracture or suspicious osseous lesion. Sinuses/Orbits: Small mucous retention cysts in the maxillary sinuses. Trace left mastoid fluid. Unremarkable orbits. Other: None. ASPECTS (Alberta Stroke Program Early CT Score) - Ganglionic level infarction  (caudate, lentiform nuclei, internal capsule, insula, M1-M3 cortex): 7 - Supraganglionic infarction (M4-M6 cortex): 3 Total score (0-10 with 10 being normal): 10 These results were communicated to Dr. Otelia Limes at 6:56 pm on 01/05/2023 by text page via the [facility] messaging system. IMPRESSION: 1. No evidence of acute intracranial abnormality.  ASPECTS of 10. 2. Left frontal lobe encephalomalacia. Electronically Signed   By: Sebastian Ache M.D.   On: 01/05/2023 18:59     Assessment/Plan: POD 11, s/p right hemicolectomy with ileostomy secondary to Acute mesenteric ischemia by Dr. Azucena Cecil 11/1 just after midnight. Left in discontinuity with abthera. Also underwent revascularization with angioplasty of SMA by Dr. Lenell Antu.  -S/p washout, end ileostomy, JP drain placement and abdominal closure 11/2 - afebrile, pressor requirement slightly up, WBC decreased to 9K - on plavix - slowly increase TFs to 30cc/hr today.  Will monitor for tolerance, especially on pressors. -d/w primary service at the bedside.     FEN - NPO, TFs at 30cc/hr, TPN, will start to wean tomorrow if tolerates today DVT - SCDs, Plavix Dispo - ICU    LOS: 13 days    Letha Cape, [facility] Surgery Please see Amion for pager number during day hours 7:00am-4:30pm       01/07/2023

## 2023-01-07 NOTE — Progress Notes (Signed)
[name] Progress Note  Subjective: seen in ICU I/O yest: net+ 1.3 L , today net neg 0.9 L Pressors: levo @4 , vaso 0.04 CVP: 17 this am  Vitals:   01/07/23 1315 01/07/23 1330 01/07/23 1345 01/07/23 1400  BP: (!) 115/39 (!) 112/44 (!) 105/38 (!) 129/43  Pulse: 66 65 63 68  Resp: 20 20 19  (!) 21  Temp:      TempSrc:      SpO2: 100% 100% 100% 100%  Weight:      Height:        Exam: Gen: back on vent, eyes open and close to voice CVS: Pulse regular rhythm, normal rate, S1 and S2 normal Resp: Transmitted breath sounds bilaterally without distinct rales or rhonchi Abd: s/p exploratory laparotomy with RLQ ostomy stoma.  Surgical dressings intact Ext: no LE edema, +L femoral temp HD cath; LUA AV fistula +bruit   Renal-related home meds: - plavix - renavite - velphoro 500 ac tid - norvasc 5 every day - others: statin, zetia, allopurinol, rapaflo      OP HD: TTS NW 4h   350/1.5    79.6kg   2/2 bath   AVF   Heparin none - last OP HD 10/31, post wt 79.5kg - getting to dry wt, good compliance w/ HD - rocaltrol 0.5 mcg  - mircera 30 mcg IV q 4 wks, last 10/19, due 11/16   Assessment/ Plan Acute mesenteric ischemia with ischemic gut injury: Status post exploratory laparotomy with large bowel resection and ileostomy along with SMA angioplasty.  Ongoing management per surgery and vascular surgery-remains on heparin infusion for SMA stent along with TPN for parenteral nutrition.  ESRD: usual HD is TTS. CRRT started 11/1 in the setting of hemodynamic instability/critical illness. CRRT stopped 11/10, but then pt decompensated and went back on 11/12. Cont CRRT.  Hypotension: on levo and vaso gtt's Anemia esrd: Hb 8s today. Cont darbe 40 mcg weekly while here.  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  Nutrition: On TPN with discontinuation of tube feeds d/t emesis Volume excess: CVP 17 today, will ^ UF to 100-125 cc/hr    Peter Moselle MD  [facility] 01/07/2023, 2:19 PM  Recent  Labs  Lab 01/06/23 1600 01/07/23 0401 01/07/23 1040 01/07/23 1043  HGB  --  7.8* 8.8*  --   ALBUMIN 2.4* 2.3*  2.3*  --   --   CALCIUM 9.1 8.7*  --  8.8*  PHOS 2.2* 2.3*  --   --   CREATININE 2.91* 2.34*  --  1.85*  K 3.8 3.7 3.9 3.8   No results for input(s): "IRON", "TIBC", "FERRITIN" in the last 168 hours. Inpatient medications:  arformoterol  15 mcg Nebulization BID   aspirin  81 mg Per Tube Daily   Chlorhexidine Gluconate Cloth  6 each Topical Daily   clopidogrel  75 mg Per Tube Daily   darbepoetin (ARANESP) injection - DIALYSIS  40 mcg Subcutaneous Q Mon-1800   doxercalciferol  4 mcg Intravenous Q M,W,F   feeding supplement (PROSource TF20)  60 mL Per Tube QID   gabapentin  300 mg Per Tube QHS   insulin aspart  0-15 Units Subcutaneous Q4H   latanoprost  1 drop Both Eyes QHS   metoCLOPramide (REGLAN) injection  5 mg Intravenous Q8H   midodrine  15 mg Per Tube Q8H   mouth rinse  15 mL Mouth Rinse Q2H   revefenacin  175 mcg Nebulization Daily   rosuvastatin  10 mg Per Tube  Daily   sodium chloride flush  10-40 mL Intracatheter Q12H     prismasol BGK 4/2.5 400 mL/hr at 01/07/23 1338    prismasol BGK 4/2.5 400 mL/hr at 01/07/23 1338   famotidine (PEPCID) IV 20 mg (01/07/23 0308)   TPN (CLINIMIX) Adult with Electrolyte Additives     And   fat emul(SMOFlipid)     feeding supplement (VITAL 1.5 CAL) 30 mL/hr at 01/07/23 1400   heparin 1,800 Units/hr (01/07/23 1417)   norepinephrine (LEVOPHED) Adult infusion 6 mcg/min (01/07/23 1400)   potassium chloride 50 mL/hr at 01/07/23 1400   prismasol BGK 4/2.5 1,500 mL/hr at 01/07/23 1251   sodium phosphate 15 mmol in dextrose 5 % 250 mL infusion 43 mL/hr at 01/07/23 1400   TPN (CLINIMIX) Adult without lytes 82 mL/hr at 01/07/23 1400   vasopressin 0.04 Units/min (01/07/23 1400)   fentaNYL (SUBLIMAZE) injection, heparin, mouth rinse, sodium chloride flush, white petrolatum

## 2023-01-07 NOTE — Progress Notes (Signed)
NAME:  Peter Oneill, MRN:  [id number], DOB:  01-28-1941, LOS: 13 ADMISSION DATE:  12/25/2022, CONSULTATION DATE:  12/25/2022 REFERRING MD:  EDP, CHIEF COMPLAINT:  Abdominal Pain   History of Present Illness:  82 year old man with PMHx significant for ESRD, Asbestosis, CAD s/p CABG, HTN, HL, OSA and emphysema followed by Dr. [name] who presented to the ED with acutely worsening abdominal pain that began the day of admission.  He underwent dialysis in the morning and then felt short of breath followed by abdominal discomfort.  His initial oxygen saturation was 70% on EMS arrival and his work-up in the ED included a CT Abd/pelvis concerning for acute mesenteric ischemia secondary to occlusion of the celiac artery and significant stenosis of the superior mesenteric artery and small inferior mesenteric artery.  His labs showed a lactic acid of 1.4, WBC 6.3, creatinine 3.7, BNP >1300, normal Hgb.  He was taken emergently to the OR for urgent revascularization and SMA stenting along with ex-lap.  PCCM asked to admit  Pertinent Medical History:   Past Medical History:  Diagnosis Date   Asbestosis (HCC)    Contraindication to percutaneous coronary intervention (PCI)    Coronary artery disease    Dyslipidemia    Dyspnea    ESRD on hemodialysis (HCC)    History of anemia    Hypercholesterolemia    Hypertension    Ileus (HCC)    Pleural effusion on left    Pneumonia    Sleep apnea    Significant Hospital Events: Including procedures, antibiotic start and stop dates in addition to other pertinent events   10/31 Presented with mesenteric ischemia, occlusion of the celiac and significant stenosis of the SMA and small inferior mesenteric artery. Went to OR for urgent revascularization and SMA stenting along with ex-lap. Right hemicolectomy for necrotic cecum. Significant pressor requirement and bleeding (800 cc 1st hour) from abdominal wound vac post-op, given 4L PRBC's, 500cc albumin, 500cc NS and  was on Levophed , Neo , Vaso 0.04 and Epi . Pressors were titrated down with resuscitation.  11/1. CVL placed for pressors. LE warm . Weaning pressors. Orthostatic w/ positional change. 2.2 liters positive. Changing prop to dex. Drain output decreased  11/2 Back to OR for washout, end ileostomy, and closure 11/4 Pressors weaning. SBT  11/6 starting nepro  11/7 increasing TF from 20 to 40, hoping to pull some volume on CRRT prior to considering extubation. Completed zosyn  11/8 cuff leak, adv ETT 1cm. Remains on pressors and CRRT. Adding enteral pain meds to try to reduce continuous sedation burden   11/9 CO2 retention, backed off on opiates 11/10 improved mental status 11/11 on BiPAP 11/11-code stroke called-MRI negative for acute stroke  Interim History / Subjective:  No overnight events Tolerating CRRT Awake and interactive Still very weak overall  Objective:  Blood pressure (!) 118/34, pulse 64, temperature 98.6 F (37 C), temperature source Axillary, resp. rate (!) 22, height 5\' 9"  (1.753 m), weight 85.4 kg, SpO2 100%. CVP:  [10 mmHg-23 mmHg] 14 mmHg  Vent Mode: PSV;CPAP FiO2 (%):  [40 %] 40 % Set Rate:  [24 bmp] 24 bmp Vt Set:  [560 mL] 560 mL PEEP:  [5 cmH20] 5 cmH20 Pressure Support:  [12 cmH20] 12 cmH20 Plateau Pressure:  [23 cmH20-31 cmH20] 23 cmH20   Intake/Output Summary (Last 24 hours) at 01/07/2023 0904 Last data filed at 01/07/2023 0900 Gross per 24 hour  Intake 4007.93 ml  Output 4420 ml  Net -412.07 ml  Filed Weights   01/05/23 0500 01/06/23 0500 01/07/23 0345  Weight: 82.4 kg 84.7 kg 85.4 kg   Physical Examination: Elderly, does not appear to be in distress Decreased air movement bilaterally with rales at the bases S1-S2 appreciated Bowel sounds appreciated Trace peripheral edema  I reviewed nursing notes, Consultant notes, last 24 h vitals and pain scores, last 48 h intake and output, last 24 h labs and trends, and last 24 h imaging  results.  Assessment & Plan:   Marland Kitchen  Acute hypoxemic/hypercapnic respiratory failure -Continue mechanical ventilation -Target TVol 6-8cc/kgIBW -Target Plateau Pressure < 30cm H20 -Target driving pressure less than 15 cm of water -Target PaO2 55-65: titrate PEEP/FiO2 per protocol -Ventilator associated pneumonia prevention protocol  .  History of emphysema .  History of asbestosis -Continue respiratory support -Continue bronchodilators  .  Acute mesenteric ischemia/ischemic bowel -S/p expiratory laparotomy x 2, resection of large bowel with ileostomy and angioplasty of SMA occlusion -Completed antibiotics-Zosyn -On trickle feeds and appears to be tolerating it at present, discussed with surgery, rates to be increased to 30 cc an hour -Appreciate surgical follow-up  .  Acute blood loss anemia -Continue to monitor -Transfuse per protocol -Remains on vasopressors  .  End-stage renal disease on hemodialysis -Tolerating CRRT well -Discussed with renal today  Paroxysmal atrial fibrillation -Is rate controlled at present  Continue to update family members regarding ongoing care Appears to be weaning well today There is the risk of needing ventilator long-term as he appears very weak at present Tracheostomy has been discussed  Best Practice: (right click and "Reselect all SmartList Selections" daily)   Diet/type: Continue TPN, trickle feeds, increased to 30 cc DVT prophylaxis:  heparin gtt GI prophylaxis: H2B Lines: Central line and Dialysis Catheter Foley:  N/A Code Status:  full code Last date of multidisciplinary goals of care discussion: 11/12  The patient is critically ill with multiple organ systems failure and requires high complexity decision making for assessment and support, frequent evaluation and titration of therapies, application of advanced monitoring technologies and extensive interpretation of multiple databases. Critical Care Time devoted to patient care  services described in this note independent of APP/resident time (if applicable)  is 32 minutes.   Virl Diamond MD  Pulmonary Critical Care Personal pager: See Amion If unanswered, please page CCM On-call: #[phone]

## 2023-01-07 NOTE — Progress Notes (Addendum)
PHARMACY - TOTAL PARENTERAL NUTRITION CONSULT NOTE   Indication:  bowel discontinuity  Patient Measurements: Height: 5\' 9"  (175.3 cm) Weight: 85.4 kg (188 lb 4.4 oz) IBW/kg (Calculated) : 70.7 TPN AdjBW (KG): 81.2 Body mass index is 27.8 kg/m.  Assessment: 82 years of age male with moderate malnutrition at baseline due to chronic illness including ESRD on HD who presented with acute mesenteric ischemia s/p ex lap with R hemicolectomy, angioplasty and stenting of SMA, wound VAC placement, and bowel left in discontinuity on 11/1. Pharmacy consulted for TPN.   Pt returned to OR on 11/2 for washout with end ileostomy, JP drain placement, and abdominal wall closure 11/2. Patient was initiated on TF on 11/6 and was increased to 46mL/hr (goal 50 mL/hr). TF were paused 11/9 afternoon due to emesis - NG back to suction. CRRT discontinued 11/10 ~1900. Patient acutely decompensated on 11/11 PM and a code stroke was called without any acute findings on [facility] or MRI Brain and patient was reintubated. Restarted TF at 41ml/hr on 11/12 AM and CRRT was reinitiated 11/12. TF increasing to 101ml/hr on 11/13.  Glucose / Insulin: CBGs <180. 13 units SSI/24hrs.  Electrolytes: Na 129, K 3.7, Phos 2.3 (s/p NaPhos), Mg 2.3, coCa 10.1 Renal: BUN down 86; ESRD on HD TTS PTA, last HD 10/31, transitioned to CRRT 11/1 >> 11/10 PM, resumed 11/12 >> Hepatic: AST/ALT up 205/106. Tbili up 2.8 (no jaundice noted). AlkPhos up 257. Albumin 2.3. TG 52 (11/11) Intake / Output; MIVF: No UOP. CRRT removed 3767 mL. NG output 0 mL. JP drain output 30mL. Ileostomy output . Net +8.8 L  GI Imaging: 11/10 KUB - nonobstructive bowel gas pattern GI Surgeries / Procedures:  None since start of TPN  Central access: CVC (double lumen) placed 11/1 - RN to dedicate lumen for TPN. TPN start date: 12/28/22  Nutritional Goals: Clinimix 8/10 2 L w/o electrolytes - SMOFlipid 250 mL daily Will provide 100% AA + 85% kcal needs  RD  Assessment: Estimated Needs Total Energy Estimated Needs: 2100-2400 kcals Total Protein Estimated Needs: 145-180 g Total Fluid Estimated Needs: 1 L + UOP  Current Nutrition:  TPN TF increased to 26ml/hr  Plan:  Continue Clinimix 8/10 at 1800 at 17ml/hr  - 2 L with NaPhos added (total 66meq/30mmol/24hr) while on CRRT - SMOF lipid 250 mL  - TPN will provide 100% AA and 85% of kcal needs Continue standard trace elements to TPN, no chromium d/t shortage Add MVI back to TPN - d/w Gen surg and prefer to have MVI in TPN vs PT for now Give NaPhos IV x1 and KCl x4 in addition to TPN Continue Moderate q4h SSI and adjust as needed Monitor TPN labs daily while on Clinimix, TGs qMon  Check renal function panel this evening and replete lytes prn with increase in fluid removal with CRRT F/u tolerance to TF and ability to advance to goal TF and wean TPN  Thank you for allowing pharmacy to be a part of this patient's care.  Wilburn Cornelia, PharmD, BCPS Clinical Pharmacist 01/07/2023 7:31 AM   Please refer to AMION for pharmacy phone number

## 2023-01-07 NOTE — Progress Notes (Signed)
PHARMACY - ANTICOAGULATION CONSULT NOTE  Pharmacy Consult for Atrial Fibrillation  Indication: atrial fibrillation  Allergies  Allergen Reactions   Atorvastatin Other (See Comments)    Muscle aches    Patient Measurements: Height: 5\' 9"  (175.3 cm) Weight: 85.4 kg (188 lb 4.4 oz) IBW/kg (Calculated) : 70.7  Vital Signs: Temp: 97.9 F (36.6 C) (11/13 2003) Temp Source: Axillary (11/13 2003) BP: 108/35 (11/13 2200) Pulse Rate: 57 (11/13 2200)  Labs: Recent Labs    01/05/23 1818 01/06/23 0440 01/06/23 1600 01/07/23 0401 01/07/23 1040 01/07/23 1043 01/07/23 1259 01/07/23 1614 01/07/23 2125  HGB 8.3* 8.3*  --  7.8* 8.8*  --   --   --  8.5*  HCT 27.0* 24.8*  --  23.9* 26.0*  --   --   --  26.1*  PLT 170 156  --  152  --   --   --   --   --   HEPARINUNFRC  --  <0.10*   < > 0.13*  --   --  0.22*  --  0.34  CREATININE 2.58* 2.86*   < > 2.34*  --  1.85*  --  1.63*  --    < > = values in this interval not displayed.    Estimated Creatinine Clearance: 38.5 mL/min (A) (by C-G formula based on SCr of 1.63 mg/dL (H)).   Medical History: Past Medical History:  Diagnosis Date   Asbestosis (HCC)    Contraindication to percutaneous coronary intervention (PCI)    Coronary artery disease    Dyslipidemia    Dyspnea    ESRD on hemodialysis (HCC)    History of anemia    Hypercholesterolemia    Hypertension    Ileus (HCC)    Pleural effusion on left    Pneumonia    Sleep apnea     Medications:  Scheduled:   arformoterol  15 mcg Nebulization BID   aspirin  81 mg Per Tube Daily   Chlorhexidine Gluconate Cloth  6 each Topical Daily   clopidogrel  75 mg Per Tube Daily   darbepoetin (ARANESP) injection - DIALYSIS  40 mcg Subcutaneous Q Mon-1800   doxercalciferol  4 mcg Intravenous Q M,W,F   feeding supplement (PROSource TF20)  60 mL Per Tube QID   gabapentin  300 mg Per Tube QHS   insulin aspart  0-15 Units Subcutaneous Q4H   latanoprost  1 drop Both Eyes QHS    metoCLOPramide (REGLAN) injection  5 mg Intravenous Q8H   midodrine  15 mg Per Tube Q8H   mouth rinse  15 mL Mouth Rinse Q2H   revefenacin  175 mcg Nebulization Daily   rosuvastatin  10 mg Per Tube Daily   sodium chloride flush  10-40 mL Intracatheter Q12H   Infusions:    prismasol BGK 4/2.5 400 mL/hr at 01/07/23 1338    prismasol BGK 4/2.5 400 mL/hr at 01/07/23 1338   famotidine (PEPCID) IV 20 mg (01/07/23 0308)   TPN (CLINIMIX) Adult with Electrolyte Additives 82 mL/hr at 01/07/23 2200   And   fat emul(SMOFlipid) 20.8 mL/hr at 01/07/23 2200   feeding supplement (VITAL 1.5 CAL) 30 mL/hr at 01/07/23 2200   heparin 1,800 Units/hr (01/07/23 2200)   norepinephrine (LEVOPHED) Adult infusion 5 mcg/min (01/07/23 2200)   prismasol BGK 4/2.5 1,500 mL/hr at 01/07/23 1933   vasopressin 0.04 Units/min (01/07/23 2200)    Assessment: 82 y/o M with mesenteric ischemia s/p OR with vascular and general surgery. S/P angioplasty and stenting  of the SMA and right hemi-colectomy.  Resumed heparin infusion for atrial fibrillation on 11/9 after being off since 11/7. Heparin was discontinued on 11/10 due to acute drop in Hgb and patient received 2u pRBC. Pharmacy consulted to restart heparin infusion for AFib on 11/12.  Heparin level 0.34, therapeutic, on heparin infusion at 1800 units/hr No issues with heparin infusing or bleeding noted per RN.   Goal of Therapy:  Heparin level 0.3-0.7 units/ml Monitor platelets by anticoagulation protocol: Yes   Plan:  Continue heparin infusion at 1800 units/hr Check confirmatory heparin level in 8 hours  Monitor daily CBC, heparin level, and for s/sx of bleeding   Thank you for allowing pharmacy to participate in this patient's care,  Loralee Pacas, PharmD, BCPS Clinical Pharmacist 01/07/2023 10:20 PM   Please refer to [facility] for pharmacy phone number

## 2023-01-07 NOTE — Progress Notes (Signed)
RN noticed tarry stool when cleaning up pt, CCM notified and H&H drawn per CCM

## 2023-01-07 NOTE — Progress Notes (Signed)
Nutrition Follow-up  DOCUMENTATION CODES:   Non-severe (moderate) malnutrition in context of chronic illness  INTERVENTION:   - Recommend exchanging NG tube for Cortrak  - TPN per Pharmacy  Continue tube feeding via NG tube: - Increase Vital 1.5 to 30 ml/hr (720 ml/day) per Surgery - Continue PROSource TF20 60 ml QID   RD will continue to monitor for ability to advance tube feeding regimen to goal: - Vital 1.5 @ 50 ml/hr (1200 ml/day) - PROSource TF20 60 ml QID  Recommended tube feeding regimen at goal rate provides 2120 kcal, 161 grams of protein, and 917 ml of H2O.  - Recommend continuing to monitor phosphorus closely while on CRRT; recommend considering daily supplementation of phosphorus while on CRRT, favor enteral route if able for continued maintenance  NUTRITION DIAGNOSIS:   Moderate Malnutrition related to chronic illness (ESRD on HD) as evidenced by mild muscle depletion, mild fat depletion.  Ongoing, being addressed via nutrition support  GOAL:   Patient will meet greater than or equal to 90% of their needs  Met via TPN + TF  MONITOR:   Vent status, Labs, Weight trends, TF tolerance, Skin, I & O's (TPN)  REASON FOR ASSESSMENT:   Consult Assessment of nutrition requirement/status (new CRRT)  ASSESSMENT:   82 yo male admitted with acute mesenteric ischemia. PMH includes HTN, HLD, CAD, CABG, sleep apnea, ESRD on HD, asbestos exposure.  11/01 - s/p angioplasty and stenting of SMA; s/p ex lap with R hemicolectomy and wound VAC placement, bowel left in discontinuity 11/02 - s/p small bowel resection, creation of end ileostomy, JP drain, abd closure 11/03 - TPN initiated 11/08 - TF held at 40 ml/hr 11/09 - pt vomited, TF held, NG tube to LIWS 11/10 - extubated, TPN back to full rate 11/11 - TF resumed at 20 ml/hr, reintubated, TF later stopped 11/12 - CRRT resumed, TF resumed at 20 ml/hr 11/13 - TF increased to 30 ml/hr  CRRT resumed yesterday, UF  increased to 100-125 ml/hr.  Tube feeds increased to 30 ml/hr today by Surgery. Tube feeds infusing via 18 French NG tube in left nare. Recommend exchanging NG tube for Cortrak. Pt continues to receive full TPN:  Clinimix 8/10 without electrolytes @ 82 ml/hr (2 L daily)  NaPhos added (total 30 mmol daily) while on CRRT  SMOFlipid 250 ml daily  Standard trace elements in TPN (no chromium due to shortage)  MVI in TPN  TPN provides 1825 kcal and 160 grams of protein daily, meeting 87% of minimum kcal needs and 100% of minimum protein needs. TPN + current TF meets >100% of estimated kcal and protein needs.  Pt with mild pitting generalized edema and mild pitting edema to BUE. Prior outpatient dry weight was 79.6 kg per Nephrology. Weight up to 85.4 kg today.  Admit weight: 81.2 kg Current weight: 85.4 kg  Current TF: Vital 1.5 @ 30 ml/hr, PROSource TF20 60 ml QID  Patient is currently intubated on ventilator support MV: 11.6 L/min Temp (24hrs), Avg:98.4 F (36.9 C), Min:98.1 F (36.7 C), Max:98.8 F (37.1 C)  Drips: TPN Heparin Levophed: 6 mcg/min Vasopressin: 0.04 units/min  Medications reviewed and include: aranesp weekly, hectorol with HD, SSI every 4 hours, IV reglan 5 mg every 8 hours, IV pepcid, IV KCl 10 mEq x 4, IV sodium phosphate 15 mmol x 1  Labs reviewed: sodium 131, BUN 74, creatinine 1.85, phosphorus 2.3, elevated LFTs, hemoglobin 8.8 CBG's: 133-155 x 24 hours  R abd JP drain: 30 ml  x 24 hours Ileostomy: 200 ml x 24 hours CRRT UF: 3767 ml x 24 hours I/O's: +8.0 L since admit  Diet Order:   Diet Order             Diet NPO time specified  Diet effective now                   EDUCATION NEEDS:   Not appropriate for education at this time  Skin:  Skin Assessment: DTI: sacrococcygeal area (present x 1 year per pt) Incision: abdomen Other: L ear laceration  Last BM:  01/07/23 ileostomy  Height:   Ht Readings from Last 1 Encounters:  12/25/22 5'  9" (1.753 m)    Weight:   Wt Readings from Last 1 Encounters:  01/07/23 85.4 kg    Ideal Body Weight:  72.7 kg  BMI:  Body mass index is 27.8 kg/m.  Estimated Nutritional Needs:   Kcal:  2100-2400 kcals  Protein:  145-180 grams  Fluid:  1 L + UOP    Mertie Clause, MS, RD, LDN Registered Dietitian II Please see AMiON for contact information.

## 2023-01-07 NOTE — Progress Notes (Signed)
Pt receives out-pt HD at [facility] on TTS. Will assist as needed.   Shemiah Rosch Renal Navigator [phone] 

## 2023-01-08 DIAGNOSIS — J9601 Acute respiratory failure with hypoxia: Secondary | ICD-10-CM | POA: Diagnosis not present

## 2023-01-08 LAB — CBC
HCT: 26.1 % — ABNORMAL LOW (ref 39.0–52.0)
Hemoglobin: 8.5 g/dL — ABNORMAL LOW (ref 13.0–17.0)
MCH: 31.3 pg (ref 26.0–34.0)
MCHC: 32.6 g/dL (ref 30.0–36.0)
MCV: 96 fL (ref 80.0–100.0)
Platelets: 166 10*3/uL (ref 150–400)
RBC: 2.72 MIL/uL — ABNORMAL LOW (ref 4.22–5.81)
RDW: 20.2 % — ABNORMAL HIGH (ref 11.5–15.5)
WBC: 15.4 10*3/uL — ABNORMAL HIGH (ref 4.0–10.5)
nRBC: 0.9 % — ABNORMAL HIGH (ref 0.0–0.2)

## 2023-01-08 LAB — PROTIME-INR
INR: 1.4 — ABNORMAL HIGH (ref 0.8–1.2)
Prothrombin Time: 16.9 s — ABNORMAL HIGH (ref 11.4–15.2)

## 2023-01-08 LAB — HEPATIC FUNCTION PANEL
ALT: 174 U/L — ABNORMAL HIGH (ref 0–44)
AST: 273 U/L — ABNORMAL HIGH (ref 15–41)
Albumin: 2.2 g/dL — ABNORMAL LOW (ref 3.5–5.0)
Alkaline Phosphatase: 330 U/L — ABNORMAL HIGH (ref 38–126)
Bilirubin, Direct: 2.8 mg/dL — ABNORMAL HIGH (ref 0.0–0.2)
Indirect Bilirubin: 1.1 mg/dL — ABNORMAL HIGH (ref 0.3–0.9)
Total Bilirubin: 3.9 mg/dL — ABNORMAL HIGH (ref <1.2)
Total Protein: 6.3 g/dL — ABNORMAL LOW (ref 6.5–8.1)

## 2023-01-08 LAB — RENAL FUNCTION PANEL
Albumin: 2.2 g/dL — ABNORMAL LOW (ref 3.5–5.0)
Albumin: 2.3 g/dL — ABNORMAL LOW (ref 3.5–5.0)
Anion gap: 9 (ref 5–15)
Anion gap: 8 (ref 5–15)
BUN: 69 mg/dL — ABNORMAL HIGH (ref 8–23)
BUN: 63 mg/dL — ABNORMAL HIGH (ref 8–23)
CO2: 21 mmol/L — ABNORMAL LOW (ref 22–32)
CO2: 23 mmol/L (ref 22–32)
Calcium: 9 mg/dL (ref 8.9–10.3)
Calcium: 9.1 mg/dL (ref 8.9–10.3)
Chloride: 100 mmol/L (ref 98–111)
Chloride: 100 mmol/L (ref 98–111)
Creatinine, Ser: 1.36 mg/dL — ABNORMAL HIGH (ref 0.61–1.24)
Creatinine, Ser: 1.3 mg/dL — ABNORMAL HIGH (ref 0.61–1.24)
GFR, Estimated: 52 mL/min — ABNORMAL LOW (ref >60)
GFR, Estimated: 55 mL/min — ABNORMAL LOW (ref >60)
Glucose, Bld: 149 mg/dL — ABNORMAL HIGH (ref 70–99)
Glucose, Bld: 150 mg/dL — ABNORMAL HIGH (ref 70–99)
Phosphorus: 2.3 mg/dL — ABNORMAL LOW (ref 2.5–4.6)
Phosphorus: 5.3 mg/dL — ABNORMAL HIGH (ref 2.5–4.6)
Potassium: 4.1 mmol/L (ref 3.5–5.1)
Potassium: 4.2 mmol/L (ref 3.5–5.1)
Sodium: 130 mmol/L — ABNORMAL LOW (ref 135–145)
Sodium: 131 mmol/L — ABNORMAL LOW (ref 135–145)

## 2023-01-08 LAB — TYPE AND SCREEN
ABO/RH(D): O POS
Antibody Screen: NEGATIVE

## 2023-01-08 LAB — GLUCOSE, CAPILLARY
Glucose-Capillary: 145 mg/dL — ABNORMAL HIGH (ref 70–99)
Glucose-Capillary: 171 mg/dL — ABNORMAL HIGH (ref 70–99)
Glucose-Capillary: 133 mg/dL — ABNORMAL HIGH (ref 70–99)
Glucose-Capillary: 139 mg/dL — ABNORMAL HIGH (ref 70–99)
Glucose-Capillary: 139 mg/dL — ABNORMAL HIGH (ref 70–99)

## 2023-01-08 LAB — HEMOGLOBIN AND HEMATOCRIT, BLOOD
HCT: 27.7 % — ABNORMAL LOW (ref 39.0–52.0)
Hemoglobin: 8.8 g/dL — ABNORMAL LOW (ref 13.0–17.0)

## 2023-01-08 LAB — MAGNESIUM: Magnesium: 2.4 mg/dL (ref 1.7–2.4)

## 2023-01-08 LAB — APTT: aPTT: 30 s (ref 24–36)

## 2023-01-08 LAB — HEPARIN LEVEL (UNFRACTIONATED): Heparin Unfractionated: 0.3 [IU]/mL (ref 0.30–0.70)

## 2023-01-08 MED ORDER — TRACE MINERALS CU-MN-SE-ZN 300-55-60-3000 MCG/ML IV SOLN
INTRAVENOUS | Status: AC
  Filled 2023-01-08: qty 2000

## 2023-01-08 MED ORDER — OXIDIZED CELLULOSE EX PADS
1.0000 | MEDICATED_PAD | Freq: Once | CUTANEOUS | Status: AC
  Administered 2023-01-08: 1 via TOPICAL
  Filled 2023-01-08: qty 1

## 2023-01-08 MED ORDER — SILVER NITRATE-POT NITRATE 75-25 % EX MISC
1.0000 | CUTANEOUS | Status: DC | PRN
  Administered 2023-01-08: 1 via TOPICAL
  Filled 2023-01-08 (×2): qty 1

## 2023-01-08 MED ORDER — TRACE MINERALS CU-MN-SE-ZN 300-55-60-3000 MCG/ML IV SOLN
INTRAVENOUS | Status: DC
  Filled 2023-01-08: qty 2000

## 2023-01-08 MED ORDER — FAT EMUL FISH OIL/PLANT BASED 20% (SMOFLIPID)IV EMUL
250.0000 mL | INTRAVENOUS | Status: AC
Start: 2023-01-08 — End: 2023-01-09
  Administered 2023-01-08: 250 mL via INTRAVENOUS
  Filled 2023-01-08: qty 250

## 2023-01-08 MED ORDER — SODIUM PHOSPHATES 45 MMOLE/15ML IV SOLN
15.0000 mmol | Freq: Once | INTRAVENOUS | Status: AC
  Administered 2023-01-08: 15 mmol via INTRAVENOUS
  Filled 2023-01-08: qty 5

## 2023-01-08 MED ORDER — FAT EMUL FISH OIL/PLANT BASED 20% (SMOFLIPID)IV EMUL
250.0000 mL | INTRAVENOUS | Status: DC
  Filled 2023-01-08: qty 250

## 2023-01-08 MED ORDER — THROMBI-PAD 3"X3" EX PADS
1.0000 | MEDICATED_PAD | Freq: Once | CUTANEOUS | Status: DC
  Filled 2023-01-08: qty 1

## 2023-01-08 NOTE — Progress Notes (Addendum)
PHARMACY - TOTAL PARENTERAL NUTRITION CONSULT NOTE   Indication:  bowel discontinuity  Patient Measurements: Height: 5\' 9"  (175.3 cm) Weight: 82 kg (180 lb 12.4 oz) IBW/kg (Calculated) : 70.7 TPN AdjBW (KG): 81.2 Body mass index is 26.7 kg/m.  Assessment: 82 years of age male with moderate malnutrition at baseline due to chronic illness including ESRD on HD who presented with acute mesenteric ischemia s/p ex lap with R hemicolectomy, angioplasty and stenting of SMA, wound VAC placement, and bowel left in discontinuity on 11/1. Pharmacy consulted for TPN.   Pt returned to OR on 11/2 for washout with end ileostomy, JP drain placement, and abdominal wall closure 11/2. Patient was initiated on TF on 11/6 and was increased to 23mL/hr (goal 50 mL/hr). TF were paused 11/9 afternoon due to emesis - NG back to suction. CRRT discontinued 11/10 ~1900. Patient acutely decompensated on 11/11 PM and a code stroke was called without any acute findings on [facility] or MRI Brain and patient was reintubated. Restarted TF at 3ml/hr on 11/12 AM and CRRT was reinitiated 11/12. TF increased to 5ml/hr on 11/13. Hold further advancement of TF until pressor requirements are down.   Glucose / Insulin: CBGs <180. 13 units SSI/24hrs.  Electrolytes: Na 130, K 4.1, Phos 2.3 (s/p NaPhos + ~6mmol NaPhos from TPN), Mg 2.4, coCa 10.4 Renal: BUN down 69; ESRD on HD TTS PTA, last HD 10/31, transitioned to CRRT 11/1 >> 11/10 PM, resumed 11/12 >> Hepatic: AST/ALT up 273/174. Tbili up 3.9 (no jaundice noted). AlkPhos up 330. Albumin 2.2. TG 52 (11/11) Intake / Output; MIVF: No UOP. CRRT removed 6791 mL. JP drain output 40mL. Ileostomy output . Net +6.1 L  GI Imaging: 11/10 KUB - nonobstructive bowel gas pattern GI Surgeries / Procedures:  None since start of TPN  Central access: CVC (double lumen) placed 11/1 - RN to dedicate lumen for TPN. TPN start date: 12/28/22  Nutritional Goals: Clinimix 8/10 2 L w/o  electrolytes - SMOFlipid 250 mL daily Will provide 100% AA + 85% kcal needs  RD Assessment: Estimated Needs Total Energy Estimated Needs: 2100-2400 kcals Total Protein Estimated Needs: 145-180 g Total Fluid Estimated Needs: 1 L + UOP  Current Nutrition:  TPN TF 65ml/hr (since 11/13), goal rate 71ml/hr  Plan:  Continue Clinimix 8/10 at 1800 at 10ml/hr  - 2 L with NaPhos added (total 48meq/30mmol/24hr) while on CRRT - SMOF lipid 250 mL  - TPN will provide 100% AA and 85% of kcal needs Decrease to half trace elements in TPN, no chromium due to elevated T. bili Add MVI back to TPN - d/w Gen surg and prefer to have MVI in TPN vs PT for now Give NaPhos IV x1 in addition to TPN Continue Moderate q4h SSI and adjust as needed Monitor TPN labs daily while on Clinimix, TGs qMon, and in AM  Check renal function panel this evening and replete lytes prn while on CRRT until stable, if additional electrolyte repletion is indicated, consider supplementation per tube F/u tolerance to TF and ability to advance to goal TF and wean TPN  Thank you for allowing pharmacy to be a part of this patient's care.  Wilburn Cornelia, PharmD, BCPS Clinical Pharmacist 01/08/2023 9:03 AM   Please refer to AMION for pharmacy phone number

## 2023-01-08 NOTE — Plan of Care (Signed)
  Problem: Clinical Measurements: Goal: Respiratory complications will improve Outcome: Progressing   Problem: Activity: Goal: Risk for activity intolerance will decrease Outcome: Progressing   Problem: Nutrition: Goal: Adequate nutrition will be maintained Outcome: Progressing   Problem: Elimination: Goal: Will not experience complications related to bowel motility Outcome: Progressing   

## 2023-01-08 NOTE — Progress Notes (Signed)
NAME:  Peter Oneill, MRN:  [id number], DOB:  1940/07/31, LOS: 14 ADMISSION DATE:  12/25/2022, CONSULTATION DATE:  12/25/2022 REFERRING MD:  EDP, CHIEF COMPLAINT:  Abdominal Pain   History of Present Illness:  82 year old man with PMHx significant for ESRD, Asbestosis, CAD s/p CABG, HTN, HL, OSA and emphysema followed by Dr. [name] who presented to the ED with acutely worsening abdominal pain that began the day of admission.  He underwent dialysis in the morning and then felt short of breath followed by abdominal discomfort.  His initial oxygen saturation was 70% on EMS arrival and his work-up in the ED included a CT Abd/pelvis concerning for acute mesenteric ischemia secondary to occlusion of the celiac artery and significant stenosis of the superior mesenteric artery and small inferior mesenteric artery.  His labs showed a lactic acid of 1.4, WBC 6.3, creatinine 3.7, BNP >1300, normal Hgb.  He was taken emergently to the OR for urgent revascularization and SMA stenting along with ex-lap.  PCCM asked to admit  Pertinent Medical History:   Past Medical History:  Diagnosis Date   Asbestosis (HCC)    Contraindication to percutaneous coronary intervention (PCI)    Coronary artery disease    Dyslipidemia    Dyspnea    ESRD on hemodialysis (HCC)    History of anemia    Hypercholesterolemia    Hypertension    Ileus (HCC)    Pleural effusion on left    Pneumonia    Sleep apnea    Significant Hospital Events: Including procedures, antibiotic start and stop dates in addition to other pertinent events   10/31 Presented with mesenteric ischemia, occlusion of the celiac and significant stenosis of the SMA and small inferior mesenteric artery. Went to OR for urgent revascularization and SMA stenting along with ex-lap. Right hemicolectomy for necrotic cecum. Significant pressor requirement and bleeding (800 cc 1st hour) from abdominal wound vac post-op, given 4L PRBC's, 500cc albumin, 500cc NS and  was on Levophed , Neo , Vaso 0.04 and Epi . Pressors were titrated down with resuscitation.  11/1. CVL placed for pressors. LE warm . Weaning pressors. Orthostatic w/ positional change. 2.2 liters positive. Changing prop to dex. Drain output decreased  11/2 Back to OR for washout, end ileostomy, and closure 11/4 Pressors weaning. SBT  11/6 starting nepro  11/7 increasing TF from 20 to 40, hoping to pull some volume on CRRT prior to considering extubation. Completed zosyn  11/8 cuff leak, adv ETT 1cm. Remains on pressors and CRRT. Adding enteral pain meds to try to reduce continuous sedation burden   11/9 CO2 retention, backed off on opiates 11/10 improved mental status 11/11 on BiPAP 11/11-code stroke called-MRI negative for acute stroke  Interim History / Subjective:  No overnight events Continues to tolerate CRRT Fluid being removed  Objective:  Blood pressure (!) 110/43, pulse 68, temperature 97.8 F (36.6 C), temperature source Axillary, resp. rate (!) 25, height 5\' 9"  (1.753 m), weight 82 kg, SpO2 100%.    Vent Mode: PRVC FiO2 (%):  [40 %] 40 % Set Rate:  [24 bmp] 24 bmp Vt Set:  [560 mL] 560 mL PEEP:  [5 cmH20] 5 cmH20 Pressure Support:  [12 cmH20] 12 cmH20 Plateau Pressure:  [27 cmH20-30 cmH20] 27 cmH20   Intake/Output Summary (Last 24 hours) at 01/08/2023 1205 Last data filed at 01/08/2023 1100 Gross per 24 hour  Intake 4359.62 ml  Output 7088 ml  Net -2728.38 ml   Filed Weights   01/06/23 0500 01/07/23  0345 01/08/23 0500  Weight: 84.7 kg 85.4 kg 82 kg   Physical Examination: Elderly, does not appear to be in distress Decreased air movement bilaterally Still profoundly weak S1-S2 appreciated no peripheral edema  I reviewed nursing notes, Consultant notes, last 24 h vitals and pain scores, last 48 h intake and output, last 24 h labs and trends, and last 24 h imaging results.  Assessment & Plan:   Acute hypoxemic hypercapnic respiratory  failure -Continue mechanical ventilation -Target TVol 6-8cc/kgIBW -Target Plateau Pressure < 30cm H20 -Target driving pressure less than 15 cm of water -Target PaO2 55-65: titrate PEEP/FiO2 per protocol -Ventilator associated pneumonia prevention protocol  Still significantly weak that unlikely to be able to tolerate being off the ventilator -He appears to be weaning well but still very very weak  History of emphysema, history of asbestosis -Continue ventilator support -Continue bronchodilators  Acute mesenteric ischemia/ischemic bowel -S/p exploratory laparotomy x 2, resection of large bowel with ileostomy and angioplasty of SMA occlusion. Completed course of Zosyn On trickle feedings-currently at 30 cc -Appreciate surgical follow-up -Once we are able to decrease pressor support, can advance tube feeds  Acute blood loss anemia -Continue to monitor -Remains on vasopressors -Transfusion as needed  End-stage renal disease on hemodialysis -Tolerating CRRT well at present -Appreciate renal service continuing to follow  Paroxysmal atrial fibrillation -Rate is controlled -Continue anticoagulation  Updated spouse at bedside Assured that he is weaning today but still very weak Tracheostomy discussed as an option going forward if he is unable to come off the ventilator  Best Practice: (right click and "Reselect all SmartList Selections" daily)   Diet/type: Continue TPN, trickle feeds, increased to 30 cc DVT prophylaxis:  heparin gtt GI prophylaxis: H2B Lines: Central line and Dialysis Catheter Foley:  N/A Code Status:  full code Last date of multidisciplinary goals of care discussion: 11/14  The patient is critically ill with multiple organ systems failure and requires high complexity decision making for assessment and support, frequent evaluation and titration of therapies, application of advanced monitoring technologies and extensive interpretation of multiple databases.  Critical Care Time devoted to patient care services described in this note independent of APP/resident time (if applicable)  is 32 minutes.   Virl Diamond MD [place] [facility] Personal pager: See Amion If unanswered, please page CCM On-call: #[phone]

## 2023-01-08 NOTE — Progress Notes (Signed)
Brief Nutrition Support Note  RD following up on pt regarding potential advancement of Vital 1.5 tube feeds to 40 ml/hr. Pt last seen by RD for follow-up assessment yesterday. Pt currently tolerating Vital 1.5 tube feeds at 30 ml/hr via NG tube. Ostomy is productive and pressor requirements are down slightly.  Per Surgery, plan to continue Vital 1.5 tube feeds at 30 ml/hr today via NG tube. Continuing full TPN but decreasing to half trace elements due to elevated T bili.  Recommend exchanging NG tube for Cortrak if there is no immediate concern that NG tube may be needed for decompression. Reached out to Surgery PA via secure chat regarding recommendation; awaiting response.  RD will continue to follow pt during admission.   Mertie Clause, MS, RD, LDN Registered Dietitian II Please see AMiON for contact information.

## 2023-01-08 NOTE — Progress Notes (Signed)
eLink Physician-Brief Progress Note Patient Name: Peter Oneill DOB: 1941/01/01 MRN: [id number]   Date of Service  01/08/2023  HPI/Events of Note  Patient oozing from a wound despite silver nitrate stick application.  eICU Interventions  Thrombin pad + gauze + pressure dressing, type and screen,  PT-INR, PTT, stat H & H.        Migdalia Dk 01/08/2023, 8:33 PM

## 2023-01-08 NOTE — Progress Notes (Signed)
PHARMACY - ANTICOAGULATION CONSULT NOTE  Pharmacy Consult for Atrial Fibrillation  Indication: atrial fibrillation  Allergies  Allergen Reactions   Atorvastatin Other (See Comments)    Muscle aches    Patient Measurements: Height: 5\' 9"  (175.3 cm) Weight: 82 kg (180 lb 12.4 oz) IBW/kg (Calculated) : 70.7  Vital Signs: Temp: 99.9 F (37.7 C) (11/14 0715) Temp Source: Axillary (11/14 0715) BP: 107/36 (11/14 0715) Pulse Rate: 67 (11/14 0715)  Labs: Recent Labs    01/06/23 0440 01/06/23 1600 01/07/23 0401 01/07/23 1040 01/07/23 1043 01/07/23 1259 01/07/23 1614 01/07/23 2125 01/08/23 0518  HGB 8.3*  --  7.8* 8.8*  --   --   --  8.5* 8.5*  HCT 24.8*  --  23.9* 26.0*  --   --   --  26.1* 26.1*  PLT 156  --  152  --   --   --   --   --  166  HEPARINUNFRC <0.10*   < > 0.13*  --   --  0.22*  --  0.34 0.30  CREATININE 2.86*   < > 2.34*  --  1.85*  --  1.63*  --  1.36*   < > = values in this interval not displayed.    Estimated Creatinine Clearance: 42.6 mL/min (A) (by C-G formula based on SCr of 1.36 mg/dL (H)).   Medical History: Past Medical History:  Diagnosis Date   Asbestosis (HCC)    Contraindication to percutaneous coronary intervention (PCI)    Coronary artery disease    Dyslipidemia    Dyspnea    ESRD on hemodialysis (HCC)    History of anemia    Hypercholesterolemia    Hypertension    Ileus (HCC)    Pleural effusion on left    Pneumonia    Sleep apnea     Medications:  Scheduled:   arformoterol  15 mcg Nebulization BID   aspirin  81 mg Per Tube Daily   Chlorhexidine Gluconate Cloth  6 each Topical Daily   clopidogrel  75 mg Per Tube Daily   darbepoetin (ARANESP) injection - DIALYSIS  40 mcg Subcutaneous Q Mon-1800   doxercalciferol  4 mcg Intravenous Q M,W,F   feeding supplement (PROSource TF20)  60 mL Per Tube QID   gabapentin  300 mg Per Tube QHS   insulin aspart  0-15 Units Subcutaneous Q4H   latanoprost  1 drop Both Eyes QHS    metoCLOPramide (REGLAN) injection  5 mg Intravenous Q8H   midodrine  15 mg Per Tube Q8H   mouth rinse  15 mL Mouth Rinse Q2H   revefenacin  175 mcg Nebulization Daily   rosuvastatin  10 mg Per Tube Daily   sodium chloride flush  10-40 mL Intracatheter Q12H   Infusions:    prismasol BGK 4/2.5 400 mL/hr at 01/08/23 0222    prismasol BGK 4/2.5 400 mL/hr at 01/08/23 0222   famotidine (PEPCID) IV Stopped (01/08/23 0242)   feeding supplement (VITAL 1.5 CAL) 30 mL/hr at 01/08/23 0700   heparin 1,800 Units/hr (01/08/23 0700)   norepinephrine (LEVOPHED) Adult infusion 5 mcg/min (01/08/23 0700)   prismasol BGK 4/2.5 1,500 mL/hr at 01/08/23 0543   sodium phosphate 15 mmol in dextrose 5 % 250 mL infusion     TPN (CLINIMIX) Adult with Electrolyte Additives 82 mL/hr at 01/08/23 0700   vasopressin 0.04 Units/min (01/08/23 0700)    Assessment: 82 y/o M with mesenteric ischemia s/p OR with vascular and general surgery. S/P angioplasty and stenting  of the SMA and right hemi-colectomy.  Resumed heparin infusion for atrial fibrillation on 11/9 after being off since 11/7. Heparin was discontinued on 11/10 due to acute drop in Hgb and patient received 2u pRBC. Pharmacy consulted to restart heparin infusion for AFib on 11/12.  Heparin level 0.3, therapeutic (at lowest point of therapeutic range) on heparin infusion at 1800 units/hr No issues with heparin infusing or bleeding noted per RN.   Goal of Therapy:  Heparin level 0.3-0.7 units/ml Monitor platelets by anticoagulation protocol: Yes   Plan:  Increase heparin infusion to 1900 units/hr Check confirmatory heparin level in 8 hours  Monitor daily CBC, heparin level, and for s/sx of bleeding   Thank you for allowing pharmacy to participate in this patient's care,  Wilburn Cornelia, PharmD, BCPS Clinical Pharmacist 01/08/2023 7:49 AM   Please refer to [facility] for pharmacy phone number

## 2023-01-08 NOTE — Progress Notes (Signed)
Patient ID: Peter Oneill, male   DOB: [birth year], 82 y.o.   MRN: [id number] [facility] Surgery Progress Note  12 Days Post-Op  Subjective: CC-  On the vent, CRRT. Tolerating TF at 30cc/hr. Ostomy productive. No n/v.  Pressors slightly down, Levo 4 and Vaso 0.04  Objective: Vital signs in last 24 hours: Temp:  [97.9 F (36.6 C)-99.9 F (37.7 C)] 99.9 F (37.7 C) (11/14 0715) Pulse Rate:  [53-74] 63 (11/14 0820) Resp:  [19-36] 24 (11/14 0815) BP: (100-134)/(28-51) 106/38 (11/14 0820) SpO2:  [98 %-100 %] 100 % (11/14 0815) FiO2 (%):  [40 %] 40 % (11/14 0820) Weight:  [82 kg] 82 kg (11/14 0500) Last BM Date : 01/07/23  Intake/Output from previous day: 11/13 0701 - 11/14 0700 In: 4570.2 [I.V.:2981; NG/GT:1082; IV Piggyback:507.3] Out: 7206 [Drains:40; Stool:375] Intake/Output this shift: Total I/O In: 146.2 [I.V.:116.2; NG/GT:30] Out: 270   PE: General appearance: critically ill appearing on vent GI: soft, open wound OK with few areas of necrosis and mild separation but fascia in tact, ileostomy mucosa difficult to see/ appears to be sloughing some and a bit retracted but having good stool output, ostomy appliance filled with brown liquid stool and air. drain with low amount of serosang output             Ostomy - 375 cc             JP - 40 SS   Lab Results:  Recent Labs    01/07/23 0401 01/07/23 1040 01/07/23 2125 01/08/23 0518  WBC 9.3  --   --  15.4*  HGB 7.8*   < > 8.5* 8.5*  HCT 23.9*   < > 26.1* 26.1*  PLT 152  --   --  166   < > = values in this interval not displayed.   BMET Recent Labs    01/07/23 1614 01/08/23 0518  NA 130* 130*  K 4.5 4.1  CL 100 100  CO2 20* 21*  GLUCOSE 154* 149*  BUN 66* 69*  CREATININE 1.63* 1.36*  CALCIUM 8.7* 9.0   PT/INR No results for input(s): "LABPROT", "INR" in the last 72 hours. CMP     Component Value Date/Time   NA 130 (L) 01/08/2023 0518   K 4.1 01/08/2023 0518   CL 100 01/08/2023 0518   CO2 21 (L)  01/08/2023 0518   GLUCOSE 149 (H) 01/08/2023 0518   BUN 69 (H) 01/08/2023 0518   CREATININE 1.36 (H) 01/08/2023 0518   CALCIUM 9.0 01/08/2023 0518   CALCIUM 8.2 (L) 05/15/2021 0750   PROT 6.3 (L) 01/08/2023 0518   PROT 6.6 03/17/2016 1226   ALBUMIN 2.2 (L) 01/08/2023 0518   ALBUMIN 2.2 (L) 01/08/2023 0518   ALBUMIN 4.0 03/17/2016 1226   AST 273 (H) 01/08/2023 0518   ALT 174 (H) 01/08/2023 0518   ALKPHOS 330 (H) 01/08/2023 0518   BILITOT 3.9 (H) 01/08/2023 0518   BILITOT 0.4 03/17/2016 1226   GFRNONAA 52 (L) 01/08/2023 0518   Lipase     Component Value Date/Time   LIPASE 41 12/25/2022 1603       Studies/Results: No results found.  Anti-infectives: Anti-infectives (From admission, onward)    Start     Dose/Rate Route Frequency Ordered Stop   12/26/22 1800  piperacillin-tazobactam (ZOSYN) IVPB 3.375 g        3.375 g 12.5 mL/hr over 240 Minutes Intravenous Every 8 hours 12/26/22 1615 01/01/23 2110   12/25/22 2200  piperacillin-tazobactam (ZOSYN) IVPB 2.25  g  Status:  Discontinued        2.25 g 100 mL/hr over 30 Minutes Intravenous Every 8 hours 12/25/22 2112 12/26/22 1615        Assessment/Plan POD 12, s/p right hemicolectomy with ileostomy secondary to Acute mesenteric ischemia by Dr. Azucena Cecil 11/1 just after midnight. Left in discontinuity with abthera. Also underwent revascularization with angioplasty of SMA by Dr. Lenell Antu.  -S/p washout, end ileostomy, JP drain placement and abdominal closure 11/2 - WBC up 15, afebrile, monitor.  - on plavix - Pressor requirement slightly down but still requiring some levo/vaso. Discussed with primary team at bedside, continue TFs at 30cc/hr today. Continue TPN     FEN - NPO, TFs at 30cc/hr, TPN DVT - SCDs, Plavix Dispo - ICU     LOS: 14 days    Franne Forts, [facility] 01/08/2023, 9:10 AM Please see Amion for pager number during day hours 7:00am-4:30pm

## 2023-01-08 NOTE — Progress Notes (Signed)
On follow up assessment, patient's abdominal wound dressing saturated with blood. Notified Dr. Wynona Neat and pharmacist, Albin Felling, who both came to bedside to observe. Decision made to hold heparin gtt.

## 2023-01-08 NOTE — Progress Notes (Addendum)
[facility] Progress Note  Subjective: seen in ICU I/O yest: net neg 2.1 L yesterday Pressors: levo @4 , vaso 0.04   Vitals:   01/08/23 1525 01/08/23 1530 01/08/23 1540 01/08/23 1545  BP: (!) 125/41 (!) 110/39  (!) 103/39  Pulse: 63 61  66  Resp: (!) 27 (!) 22  (!) 22  Temp:   98.1 F (36.7 C)   TempSrc:   Axillary   SpO2: 100% 100%  100%  Weight:      Height:        Exam: Gen: back on vent, eyes open and close to voice CVS: Pulse regular rhythm, normal rate, S1 and S2 normal Resp: Transmitted breath sounds bilaterally without distinct rales or rhonchi Abd: s/p exploratory laparotomy with RLQ ostomy stoma.  Surgical dressings intact Ext: no LE edema, +L femoral temp HD cath; LUA AV fistula +bruit   Renal-related home meds: - plavix - renavite - velphoro 500 ac tid - norvasc 5 every day - others: statin, zetia, allopurinol, rapaflo      OP HD: TTS NW 4h   350/1.5    79.6kg   2/2 bath   AVF   Heparin none - last OP HD 10/31, post wt 79.5kg - getting to dry wt, good compliance w/ HD - rocaltrol 0.5 mcg  - mircera 30 mcg IV q 4 wks, last 10/19, due 11/16   Assessment/ Plan Acute mesenteric ischemia with ischemic gut injury: Status post exploratory laparotomy with large bowel resection and ileostomy along with SMA angioplasty.  Ongoing management per surgery and vascular surgery-remains on heparin infusion for SMA stent along with TPN for parenteral nutrition.  Acute hypoxic resp failure: vent-dependent. If pt has trach will be near impossible to place in outpatient HD units.  ESRD: usual HD is TTS. CRRT started 11/1 in the setting of hemodynamic instability/critical illness. CRRT stopped 11/10, but then decompensated and went back on CRRT 11/12. Cont CRRT.  Volume excess: CVP 17 yest, cont 100-125 cc/hr.  Hypotension: remains on levo and vaso gtt's and midodrine 15 tid Anemia esrd: Hb 8s today. Cont darbe 40 mcg weekly while here.  CKD-MBD: CCa in range,  phos stable. Cont IV vdra Nutrition: On TPN    Rob Arlean Hopping MD  [facility] 01/08/2023, 3:56 PM  Recent Labs  Lab 01/07/23 2125 01/08/23 0518 01/08/23 1519  HGB 8.5* 8.5*  --   ALBUMIN  --  2.2*  2.2* 2.3*  CALCIUM  --  9.0 9.1  PHOS  --  2.3* 5.3*  CREATININE  --  1.36* 1.30*  K  --  4.1 4.2   No results for input(s): "IRON", "TIBC", "FERRITIN" in the last 168 hours. Inpatient medications:  arformoterol  15 mcg Nebulization BID   aspirin  81 mg Per Tube Daily   Chlorhexidine Gluconate Cloth  6 each Topical Daily   clopidogrel  75 mg Per Tube Daily   darbepoetin (ARANESP) injection - DIALYSIS  40 mcg Subcutaneous Q Mon-1800   doxercalciferol  4 mcg Intravenous Q M,W,F   feeding supplement (PROSource TF20)  60 mL Per Tube QID   gabapentin  300 mg Per Tube QHS   insulin aspart  0-15 Units Subcutaneous Q4H   latanoprost  1 drop Both Eyes QHS   metoCLOPramide (REGLAN) injection  5 mg Intravenous Q8H   midodrine  15 mg Per Tube Q8H   mouth rinse  15 mL Mouth Rinse Q2H   revefenacin  175 mcg Nebulization Daily   rosuvastatin  10  mg Per Tube Daily   sodium chloride flush  10-40 mL Intracatheter Q12H     prismasol BGK 4/2.5 400 mL/hr at 01/08/23 1423    prismasol BGK 4/2.5 400 mL/hr at 01/08/23 1423   famotidine (PEPCID) IV Stopped (01/08/23 0242)   TPN (CLINIMIX) Adult with Electrolyte Additives     And   fat emul(SMOFlipid)     feeding supplement (VITAL 1.5 CAL) 30 mL/hr at 01/08/23 1500   norepinephrine (LEVOPHED) Adult infusion 9 mcg/min (01/08/23 1500)   prismasol BGK 4/2.5 1,500 mL/hr at 01/08/23 1528   TPN (CLINIMIX) Adult with Electrolyte Additives 82 mL/hr at 01/08/23 1500   vasopressin 0.03 Units/min (01/08/23 1500)   fentaNYL (SUBLIMAZE) injection, heparin, mouth rinse, silver nitrate applicators, sodium chloride flush, white petrolatum

## 2023-01-08 NOTE — Consult Note (Addendum)
WOC Nurse ostomy follow up: Pt is critically ill and on the ventilator and did not participate in the pouch change.  [name] and she assisted with performing the pouch change.  Stoma type/location:  ileostomy stoma is red and viable, slightly above the skin level, 7/8 inches, small amt bleeding when cleansed. Intact skin surrounding.  60cc liquid brown stool.  Education provided: Wife assisted with shaping barrier ring and applying to sflexible convex pouch and applying the pouch over the stoma.  She was able to open and close to empty.  She states she is familiar with pouching since she is a retired [employer] fund[employer]. Enrolled patient in [place] Secure Start Discharge program: Yes, previously WOC team will assess weekly and perform further teaching sessions when Pt is stable and out of ICU.  5 sets of supplies ordered to the bedside for staff nurses' use: Use barrier rings, [employer] [place] # [phone] and flexible convex pouches [employer] # [id number] Thank-you,  Cammie Mcgee MSN, RN, CWOCN, [place], CNS [phone]  teaching

## 2023-01-09 ENCOUNTER — Inpatient Hospital Stay (HOSPITAL_COMMUNITY): Payer: Medicare HMO

## 2023-01-09 ENCOUNTER — Other Ambulatory Visit: Payer: Self-pay | Admitting: *Deleted

## 2023-01-09 DIAGNOSIS — J9601 Acute respiratory failure with hypoxia: Secondary | ICD-10-CM | POA: Diagnosis not present

## 2023-01-09 DIAGNOSIS — Z9889 Other specified postprocedural states: Secondary | ICD-10-CM | POA: Diagnosis not present

## 2023-01-09 DIAGNOSIS — K559 Vascular disorder of intestine, unspecified: Secondary | ICD-10-CM

## 2023-01-09 DIAGNOSIS — R40243 Glasgow coma scale score 3-8, unspecified time: Secondary | ICD-10-CM | POA: Diagnosis not present

## 2023-01-09 DIAGNOSIS — R569 Unspecified convulsions: Secondary | ICD-10-CM | POA: Diagnosis not present

## 2023-01-09 LAB — MAGNESIUM: Magnesium: 2.6 mg/dL — ABNORMAL HIGH (ref 1.7–2.4)

## 2023-01-09 LAB — RENAL FUNCTION PANEL
Albumin: 2.1 g/dL — ABNORMAL LOW (ref 3.5–5.0)
Albumin: 2.1 g/dL — ABNORMAL LOW (ref 3.5–5.0)
Anion gap: 15 (ref 5–15)
Anion gap: 12 (ref 5–15)
BUN: 67 mg/dL — ABNORMAL HIGH (ref 8–23)
BUN: 70 mg/dL — ABNORMAL HIGH (ref 8–23)
CO2: 14 mmol/L — ABNORMAL LOW (ref 22–32)
CO2: 19 mmol/L — ABNORMAL LOW (ref 22–32)
Calcium: 8.9 mg/dL (ref 8.9–10.3)
Calcium: 8.4 mg/dL — ABNORMAL LOW (ref 8.9–10.3)
Chloride: 98 mmol/L (ref 98–111)
Chloride: 102 mmol/L (ref 98–111)
Creatinine, Ser: 1.34 mg/dL — ABNORMAL HIGH (ref 0.61–1.24)
Creatinine, Ser: 1.74 mg/dL — ABNORMAL HIGH (ref 0.61–1.24)
GFR, Estimated: 53 mL/min — ABNORMAL LOW (ref >60)
GFR, Estimated: 39 mL/min — ABNORMAL LOW (ref >60)
Glucose, Bld: 162 mg/dL — ABNORMAL HIGH (ref 70–99)
Glucose, Bld: 211 mg/dL — ABNORMAL HIGH (ref 70–99)
Phosphorus: 4.4 mg/dL (ref 2.5–4.6)
Phosphorus: 3.1 mg/dL (ref 2.5–4.6)
Potassium: 5 mmol/L (ref 3.5–5.1)
Potassium: 4 mmol/L (ref 3.5–5.1)
Sodium: 127 mmol/L — ABNORMAL LOW (ref 135–145)
Sodium: 133 mmol/L — ABNORMAL LOW (ref 135–145)

## 2023-01-09 LAB — HEPATIC FUNCTION PANEL
ALT: 214 U/L — ABNORMAL HIGH (ref 0–44)
AST: 311 U/L — ABNORMAL HIGH (ref 15–41)
Albumin: 2.1 g/dL — ABNORMAL LOW (ref 3.5–5.0)
Alkaline Phosphatase: 348 U/L — ABNORMAL HIGH (ref 38–126)
Bilirubin, Direct: 3 mg/dL — ABNORMAL HIGH (ref 0.0–0.2)
Indirect Bilirubin: 1.5 mg/dL — ABNORMAL HIGH (ref 0.3–0.9)
Total Bilirubin: 4.5 mg/dL — ABNORMAL HIGH (ref <1.2)
Total Protein: 6.7 g/dL (ref 6.5–8.1)

## 2023-01-09 LAB — POCT I-STAT 7, (LYTES, BLD GAS, ICA,H+H)
Acid-base deficit: 3 mmol/L — ABNORMAL HIGH (ref 0.0–2.0)
Bicarbonate: 22.3 mmol/L (ref 20.0–28.0)
Calcium, Ion: 1.22 mmol/L (ref 1.15–1.40)
HCT: 27 % — ABNORMAL LOW (ref 39.0–52.0)
Hemoglobin: 9.2 g/dL — ABNORMAL LOW (ref 13.0–17.0)
O2 Saturation: 96 %
Patient temperature: 98
Potassium: 4.7 mmol/L (ref 3.5–5.1)
Sodium: 133 mmol/L — ABNORMAL LOW (ref 135–145)
TCO2: 24 mmol/L (ref 22–32)
pCO2 arterial: 41.5 mm[Hg] (ref 32–48)
pH, Arterial: 7.337 — ABNORMAL LOW (ref 7.35–7.45)
pO2, Arterial: 86 mm[Hg] (ref 83–108)

## 2023-01-09 LAB — POCT I-STAT EG7
Acid-base deficit: 13 mmol/L — ABNORMAL HIGH (ref 0.0–2.0)
Bicarbonate: 18.7 mmol/L — ABNORMAL LOW (ref 20.0–28.0)
Calcium, Ion: 1.23 mmol/L (ref 1.15–1.40)
HCT: 25 % — ABNORMAL LOW (ref 39.0–52.0)
Hemoglobin: 8.5 g/dL — ABNORMAL LOW (ref 13.0–17.0)
O2 Saturation: 50 %
Patient temperature: 98
Potassium: 4.9 mmol/L (ref 3.5–5.1)
Sodium: 135 mmol/L (ref 135–145)
TCO2: 21 mmol/L — ABNORMAL LOW (ref 22–32)
pCO2, Ven: 80.4 mm[Hg] (ref 44–60)
pH, Ven: 6.972 — CL (ref 7.25–7.43)
pO2, Ven: 41 mm[Hg] (ref 32–45)

## 2023-01-09 LAB — CBC
HCT: 27.1 % — ABNORMAL LOW (ref 39.0–52.0)
Hemoglobin: 8.6 g/dL — ABNORMAL LOW (ref 13.0–17.0)
MCH: 31.2 pg (ref 26.0–34.0)
MCHC: 31.7 g/dL (ref 30.0–36.0)
MCV: 98.2 fL (ref 80.0–100.0)
Platelets: 187 10*3/uL (ref 150–400)
RBC: 2.76 MIL/uL — ABNORMAL LOW (ref 4.22–5.81)
RDW: 20.4 % — ABNORMAL HIGH (ref 11.5–15.5)
WBC: 15.8 10*3/uL — ABNORMAL HIGH (ref 4.0–10.5)
nRBC: 4.9 % — ABNORMAL HIGH (ref 0.0–0.2)

## 2023-01-09 LAB — GLUCOSE, CAPILLARY
Glucose-Capillary: 127 mg/dL — ABNORMAL HIGH (ref 70–99)
Glucose-Capillary: 128 mg/dL — ABNORMAL HIGH (ref 70–99)
Glucose-Capillary: 141 mg/dL — ABNORMAL HIGH (ref 70–99)
Glucose-Capillary: 147 mg/dL — ABNORMAL HIGH (ref 70–99)
Glucose-Capillary: 155 mg/dL — ABNORMAL HIGH (ref 70–99)
Glucose-Capillary: 156 mg/dL — ABNORMAL HIGH (ref 70–99)
Glucose-Capillary: 161 mg/dL — ABNORMAL HIGH (ref 70–99)
Glucose-Capillary: 179 mg/dL — ABNORMAL HIGH (ref 70–99)

## 2023-01-09 LAB — TRIGLYCERIDES: Triglycerides: 101 mg/dL (ref <150)

## 2023-01-09 MED ORDER — EPINEPHRINE HCL 5 MG/250ML IV SOLN IN NS
0.5000 ug/min | INTRAVENOUS | Status: DC
  Administered 2023-01-09: 10 ug/min via INTRAVENOUS
  Filled 2023-01-09: qty 250

## 2023-01-09 MED ORDER — ATROPINE SULFATE 1 MG/10ML IJ SOSY
PREFILLED_SYRINGE | INTRAMUSCULAR | Status: AC
  Administered 2023-01-09: 1 mg
  Filled 2023-01-09: qty 10

## 2023-01-09 MED ORDER — FAT EMUL FISH OIL/PLANT BASED 20% (SMOFLIPID)IV EMUL
250.0000 mL | INTRAVENOUS | Status: DC
  Administered 2023-01-09: 250 mL via INTRAVENOUS
  Filled 2023-01-09: qty 250

## 2023-01-09 MED ORDER — EPINEPHRINE HCL 5 MG/250ML IV SOLN IN NS
INTRAVENOUS | Status: AC
  Administered 2023-01-09: 10 ug/min via INTRAVENOUS
  Filled 2023-01-09: qty 250

## 2023-01-09 MED ORDER — TRACE MINERALS CU-MN-SE-ZN 300-55-60-3000 MCG/ML IV SOLN
INTRAVENOUS | Status: DC
  Filled 2023-01-09: qty 2000

## 2023-01-09 MED ORDER — EPINEPHRINE 1 MG/10ML IJ SOSY: 1.0000 mg | PREFILLED_SYRINGE | Freq: Once | INTRAMUSCULAR | Status: AC

## 2023-01-09 MED ORDER — IOHEXOL 350 MG/ML SOLN
75.0000 mL | Freq: Once | INTRAVENOUS | Status: AC | PRN
  Administered 2023-01-09: 75 mL via INTRAVENOUS

## 2023-01-09 MED ORDER — SODIUM BICARBONATE 8.4 % IV SOLN
50.0000 meq | Freq: Once | INTRAVENOUS | Status: AC
  Administered 2023-01-09: 50 meq via INTRAVENOUS

## 2023-01-09 MED ORDER — SODIUM CHLORIDE 0.9 % IV BOLUS
1000.0000 mL | Freq: Once | INTRAVENOUS | Status: AC
  Administered 2023-01-09: 1000 mL via INTRAVENOUS

## 2023-01-09 MED ORDER — OXIDIZED CELLULOSE EX PADS
1.0000 | MEDICATED_PAD | CUTANEOUS | Status: AC
  Administered 2023-01-09: 1 via TOPICAL
  Filled 2023-01-09: qty 1

## 2023-01-09 MED ORDER — ATROPINE SULFATE 1 MG/ML IV SOLN
1.0000 mg | Freq: Once | INTRAVENOUS | Status: AC
  Filled 2023-01-09: qty 1

## 2023-01-09 MED ORDER — ALBUMIN HUMAN 5 % IV SOLN: 12.5000 g | Freq: Once | INTRAVENOUS | Status: AC

## 2023-01-09 MED ORDER — ATROPINE SULFATE 1 MG/10ML IJ SOSY
PREFILLED_SYRINGE | INTRAMUSCULAR | Status: AC
  Filled 2023-01-09: qty 10

## 2023-01-09 MED ORDER — EPINEPHRINE 1 MG/10ML IJ SOSY
PREFILLED_SYRINGE | INTRAMUSCULAR | Status: AC
  Administered 2023-01-09: 1 mg via INTRAVENOUS
  Filled 2023-01-09: qty 10

## 2023-01-09 MED ORDER — ALBUMIN HUMAN 5 % IV SOLN
INTRAVENOUS | Status: AC
  Administered 2023-01-09: 12.5 g via INTRAVENOUS
  Filled 2023-01-09: qty 250

## 2023-01-09 NOTE — Procedures (Signed)
Patient Name: [name]  MRN: [id number]  Epilepsy Attending: Charlsie Quest  Referring Physician/Provider: Caryl Pina, MD  Date: 01/09/2023 Duration: 24.16 mins  Patient history: 82yo M with ams getting eeg to evaluate for seizure  Level of alertness: Awake  AEDs during EEG study: GBP  Technical aspects: This EEG study was done with scalp electrodes positioned according to the 10-20 International system of electrode placement. Electrical activity was reviewed with band pass filter of 1-70Hz , sensitivity of 7 uV/mm, display speed of 69mm/sec with a 60Hz  notched filter applied as appropriate. EEG data were recorded continuously and digitally stored.  Video monitoring was available and reviewed as appropriate.  Description: EEG showed continuous generalized 3 to 6 Hz theta-delta slowing. Hyperventilation and photic stimulation were not performed.     ABNORMALITY - Continuous slow, generalized  IMPRESSION: This study is suggestive of moderate to severe diffuse encephalopathy. No seizures or epileptiform discharges were seen throughout the recording.  Ervan Heber Annabelle Harman

## 2023-01-09 NOTE — Progress Notes (Signed)
PT Cancellation Note  Patient Details Name: Peter Oneill MRN: [id number] DOB: 1940-12-19   Cancelled Treatment:    Reason Eval/Treat Not Completed: Patient not medically ready (Pt intubated and unresponsive. PT signing off, please reorder as appropriate.)    Kathlyn [place], PT, DPT [facility]  Rehabilitation Services Physical Therapist Office: [phone] Website: [email or web address]  Berton Mount 01/09/2023, 4:22 PM

## 2023-01-09 NOTE — Progress Notes (Signed)
Pt had fever and CCM notified. CRRT blood warmer off

## 2023-01-09 NOTE — Procedures (Signed)
Arterial Catheter Insertion Procedure Note  Peter Oneill  [id number]  [birth year]  Date:01/09/23  Time:4:55 AM    Provider Performing: Tim Lair   Procedure: Insertion of Arterial Line (02725) with US guidance (36644)   Indication(s) Blood pressure monitoring and/or need for frequent ABGs  Consent: Unable to obtain consent due to emergent nature of procedure.  Anesthesia: None  Time Out: Verified patient identification, verified procedure, site/side was marked, verified correct patient position, special equipment/implants available, medications/allergies/relevant history reviewed, required imaging and test results available.  Sterile Technique: Maximal sterile technique including full sterile barrier drape, hand hygiene, sterile gown, sterile gloves, mask, hair covering, sterile ultrasound probe cover (if used).  Procedure Description: Area of catheter insertion was cleaned with chlorhexidine and draped in sterile fashion. With real-time ultrasound guidance an arterial catheter was placed into the right femoral artery.  Appropriate arterial tracings confirmed on monitor.    Complications/Tolerance: None; patient tolerated the procedure well.  EBL: Minimal  Specimen(s): None  Tim Lair, [place] [facility] 01/09/23 4:55 AM  Please see [email or web address] for pager details.  From 7A-7P if no response, please call [phone] After hours, please call ELink [phone]

## 2023-01-09 NOTE — Progress Notes (Signed)
Pt not responding to commands this am and pupils non reactive w right pupil being greater than left. CCM called to bedside and new orders in.

## 2023-01-09 NOTE — Progress Notes (Signed)
EEG complete - results pending 

## 2023-01-09 NOTE — Progress Notes (Signed)
Nutrition Follow-up  DOCUMENTATION CODES:   Non-severe (moderate) malnutrition in context of chronic illness  INTERVENTION:   Recommend meeting 100% of nutritional needs via TPN; recommend pt for Custom TPN at this time so that protein and calories can be met but in smaller volume. Discussed with TPN Pharmacist  Agree with holding TF at this time, TF order discontinued  NUTRITION DIAGNOSIS:   Moderate Malnutrition related to chronic illness (ESRD on HD) as evidenced by mild muscle depletion, mild fat depletion.  Being addressed  GOAL:   Patient will meet greater than or equal to 90% of their needs  Not Met  MONITOR:   Vent status, Labs, Weight trends, TF tolerance, Skin, I & O's (TPN)  REASON FOR ASSESSMENT:   Consult Assessment of nutrition requirement/status (new CRRT)  ASSESSMENT:   82 yo male admitted with acute mesenteric ischemia. PMH includes HTN, HLD, CAD, CABG, sleep apnea, ESRD on HD, asbestos exposure.  11/01 - s/p angioplasty and stenting of SMA; s/p ex lap with R hemicolectomy and wound VAC placement, bowel left in discontinuity 11/02 - s/p small bowel resection, creation of end ileostomy, JP drain, abd closure 11/03 - TPN initiated 11/08 - TF held at 40 ml/hr 11/09 - pt vomited, TF held, NG tube to LIWS 11/10 - extubated, TPN back to full rate 11/11 - TF resumed at 20 ml/hr, reintubated, TF later stopped 11/12 - CRRT resumed, TF resumed at 20 ml/hr 11/13 - TF increased to 30 ml/hr 11/15 - Code Stroke, TF held with hemodynamic instability, pressors up  Pt on vent support, remains on CRRT Code Stroke this AM  TF on hold with worsening pressor requirements; previously infusing at 30 ml/hr. Levophed at 40 mcg/min, epinephrine initiated at 17 mcg/min and currently down to 6, vasopressin 0.03  +bleeding from midline wound, thrombin pad and pressure dressing applied with hemostatsis CT abdomen pending  Clinimix 8%AA/10%Dextrose at 82 ml/hr (2L volume)  plus 250 mL SMOFLipid daily providing 1825 kcals, 160 g of protein  Remains net +3L; net negative only 800 mL for the past 24 hours despite 5.3 L volume removal from CRRT. Significant IV fluid input. Remains anuric  Current wt 82.3 kg  Labs: sodium 133 (L), potassium 4.7 (wdl), Creatinine 1.34 (wdl), BUN 67 Meds: reviewed    Diet Order:   Diet Order             Diet NPO time specified  Diet effective now                   EDUCATION NEEDS:   Not appropriate for education at this time  Skin:  Skin Assessment: Skin Integrity Issues: Skin Integrity Issues:: DTI DTI: sacrococcygeal area (present x 1 year per pt) Wound Vac: abdominal incision Other: L ear laceration  Last BM:  01/07/23 ileostomy  Height:   Ht Readings from Last 1 Encounters:  12/15/2022 5\' 9"  (1.753 m)    Weight:   Wt Readings from Last 1 Encounters:  01/09/23 82.3 kg    BMI:  Body mass index is 26.79 kg/m.  Estimated Nutritional Needs:   Kcal:  2100-2400 kcals  Protein:  145-180 g  Fluid:  1 L + UOP   Romelle Starcher MS, RDN, LDN, CNSC Registered Dietitian 3 Clinical Nutrition RD Pager and On-Call Pager Number Located in [place]

## 2023-01-09 NOTE — Progress Notes (Signed)
Multiple RNs and RT called to bedside due to hemodynamic instability. HR in the 40s, BP 30/10's. Pt unresponsive. [facility] MD called to bedside emergently.    0321: CRRT blood flow turned down to  Verbal orders given by Dr Warrick Parisian and carried out as follows:  0322: NS bolus started 0323: 1 amp of Atropine 0329: 1 ml of Epi given, Epi drip started at 10 mcg 0331: 2 mls of Epi given, Epi drip titrated up to 20 mcg 0332: 1 amp Bicab, 1 5% Albumin given 0333: FiO2 on vent increased to 100% 0334: 4 mls of Epi given 0335: Epi drip titrated to 25 mcg 0340:Pts BP started to stabilize, Epi drip titrated down to 20 mcg  BMET, CBC drawn and sent. VBG drawn  0355: Ground team to bedside.

## 2023-01-09 NOTE — Progress Notes (Signed)
PHARMACY - TOTAL PARENTERAL NUTRITION CONSULT NOTE   Indication:  bowel discontinuity  Patient Measurements: Height: 5\' 9"  (175.3 cm) Weight: 82.3 kg (181 lb 7 oz) IBW/kg (Calculated) : 70.7 TPN AdjBW (KG): 81.2 Body mass index is 26.79 kg/m.  Assessment: 82 years of age male with moderate malnutrition at baseline due to chronic illness including ESRD on HD who presented with acute mesenteric ischemia s/p ex lap with R hemicolectomy, angioplasty and stenting of SMA, wound VAC placement, and bowel left in discontinuity on 11/1. Pharmacy consulted for TPN.   Pt returned to OR on 11/2 for washout with end ileostomy, JP drain placement, and abdominal wall closure 11/2. Patient was initiated on TF on 11/6 and was increased to 6mL/hr (goal 50 mL/hr). TF were paused 11/9 afternoon due to emesis - NG back to suction. CRRT discontinued 11/10 ~1900. Patient acutely decompensated on 11/11 PM and a code stroke was called without any acute findings on [facility] or MRI Brain and patient was reintubated. Restarted TF at 40ml/hr on 11/12 AM and CRRT was reinitiated 11/12. TF increased to 51ml/hr on 11/13. Hold further advancement of TF until pressor requirements are down.   Glucose / Insulin: CBGs <180. 13 units SSI/24hrs.  Electrolytes: Na 133, K 4.7, Phos 4.4 (s/p NaPhos + NaPhos from TPN), Mg 2.6, coCa 10.4 Renal: BUN down 67; ESRD on HD TTS PTA, last HD 10/31, transitioned to CRRT 11/1 >> 11/10 PM, resumed 11/12 >> (off for ~3hr 11/15 due to hypotension and CT scans) Hepatic: AST/ALT up 311/214. Tbili up 4.5 (no jaundice noted). AlkPhos up 348. Albumin 2.1. TG 101 Intake / Output; MIVF: No UOP. CRRT removed 5266 mL. JP drain output 15mL. Ileostomy output . Net +3.17 L GI Imaging: 11/10 KUB - nonobstructive bowel gas pattern 11/15 CT A/P - results pending GI Surgeries / Procedures:  None since start of TPN  Central access: CVC (double lumen) placed 11/1 - RN to dedicate lumen for  TPN. TPN start date: 12/28/22  Nutritional Goals: Clinimix 8/10 2 L w/o electrolytes - SMOFlipid 250 mL daily Will provide 100% AA + 85% kcal needs  RD Assessment: Estimated Needs Total Energy Estimated Needs: 2100-2400 kcals Total Protein Estimated Needs: 145-180 g Total Fluid Estimated Needs: 1 L + UOP  Current Nutrition:  TPN TF 79ml/hr (since 11/13), goal rate 27ml/hr  Plan:  Continue Clinimix 8/10 at 1800 at 88ml/hr  - 2 L with NaPhos added (total 42meq/30mmol/24hr) while on CRRT - SMOF lipid 250 mL  - TPN will provide 100% AA and 85% of kcal needs Add half trace elements and MVI to TPN, no chromium due to elevated T. bili Continue Moderate q4h SSI and adjust as needed Monitor TPN labs daily while on Clinimix, TGs qMon Check renal function panel this evening and replete lytes prn while on CRRT until stable, if additional electrolyte repletion is indicated, consider supplementation per tube F/u results from CT A/P on 11/15 F/u tolerance to TF and ability to advance to goal TF and wean TPN  Thank you for allowing pharmacy to be a part of this patient's care.  Wilburn Cornelia, PharmD, BCPS Clinical Pharmacist 01/09/2023 11:59 AM   Please refer to AMION for pharmacy phone number

## 2023-01-09 NOTE — Progress Notes (Signed)
CRRT stopped and blood returned per CCM after code stroke activated

## 2023-01-09 NOTE — Progress Notes (Signed)
[facility] Progress Note  Subjective: seen in ICU. Had another unresponsive episode and code stroke was called. Also BP dropped overnight and is now on 3 pressors.  I/O yest: net neg 2.4 L yesterday, keeping even now Pressors: up to 3 pressors, weaning down now   Vitals:   01/09/23 1215 01/09/23 1230 01/09/23 1245 01/09/23 1300  BP:      Pulse: 92 92 91 90  Resp: (!) 33 (!) 31 (!) 31 (!) 32  Temp:    99 F (37.2 C)  TempSrc:    Axillary  SpO2: 100% 100% 100% 100%  Weight:      Height:        Exam: Gen: on vent, eyes open and close to voice CVS: Pulse regular rhythm, normal rate, S1 and S2 normal Resp: Transmitted breath sounds bilaterally without distinct rales or rhonchi Abd: s/p exploratory laparotomy with RLQ ostomy stoma.  Surgical dressings intact Ext: no LE edema, +L femoral temp HD cath; LUA AV fistula +bruit   Renal-related home meds: - plavix - renavite - velphoro 500 ac tid - norvasc 5 every day - others: statin, zetia, allopurinol, rapaflo      OP HD: TTS NW 4h   350/1.5    79.6kg   2/2 bath   AVF   Heparin none - last OP HD 10/31, post wt 79.5kg - getting to dry wt, good compliance w/ HD - rocaltrol 0.5 mcg  - mircera 30 mcg IV q 4 wks, last 10/19, due 11/16   Assessment/ Plan Acute mesenteric ischemia with ischemic gut injury: Status post exploratory laparotomy with large bowel resection and ileostomy along with SMA angioplasty.  Ongoing management per surgery and vascular surgery-remains on heparin infusion for SMA stent along with TPN for parenteral nutrition.  Acute hypoxic resp failure: vent-dependent. If pt needs to dc with trach in place will be near impossible to get him in an outpatient HD unit.  ESRD: usual HD is TTS. CRRT started 11/1 in the setting of hemodynamic instability/critical illness. CRRT stopped 11/10, but then decompensated and went back on CRRT 11/12. Cont CRRT.  Volume excess: net negative 2 L the last 2 days. Still  over dry wt, last CVP 10-15, but in the light of new hypotension and 3 pressors will keep even for now.  Hypotension: epi, levo and vaso gtt's. Midodrine 15 tid Anemia esrd: Hb 8s today. Cont darbe 40 mcg weekly while here.  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 Cont IV vdra Nutrition: On TPN    Rob Arlean Hopping MD  [facility] 01/09/2023, 2:04 PM  Recent Labs  Lab 01/08/23 1519 01/08/23 2006 01/09/23 0320 01/09/23 0344 01/09/23 0806  HGB  --    < > 8.6* 8.5* 9.2*  ALBUMIN 2.3*  --  2.1*  2.1*  --   --   CALCIUM 9.1  --  8.9  --   --   PHOS 5.3*  --  4.4  --   --   CREATININE 1.30*  --  1.34*  --   --   K 4.2  --  5.0 4.9 4.7   < > = values in this interval not displayed.   No results for input(s): "IRON", "TIBC", "FERRITIN" in the last 168 hours. Inpatient medications:  arformoterol  15 mcg Nebulization BID   aspirin  81 mg Per Tube Daily   Chlorhexidine Gluconate Cloth  6 each Topical Daily   clopidogrel  75 mg Per Tube Daily   darbepoetin (ARANESP) injection -  DIALYSIS  40 mcg Subcutaneous Q Mon-1800   doxercalciferol  4 mcg Intravenous Q M,W,F   feeding supplement (PROSource TF20)  60 mL Per Tube QID   gabapentin  300 mg Per Tube QHS   insulin aspart  0-15 Units Subcutaneous Q4H   latanoprost  1 drop Both Eyes QHS   metoCLOPramide (REGLAN) injection  5 mg Intravenous Q8H   midodrine  15 mg Per Tube Q8H   mouth rinse  15 mL Mouth Rinse Q2H   revefenacin  175 mcg Nebulization Daily   rosuvastatin  10 mg Per Tube Daily   sodium chloride flush  10-40 mL Intracatheter Q12H     prismasol BGK 4/2.5 400 mL/hr at 01/09/23 0342    prismasol BGK 4/2.5 400 mL/hr at 01/09/23 1252   epinephrine 4 mcg/min (01/09/23 1300)   famotidine (PEPCID) IV Stopped (01/09/23 0335)   TPN (CLINIMIX) Adult with Electrolyte Additives     And   fat emul(SMOFlipid)     norepinephrine (LEVOPHED) Adult infusion 40 mcg/min (01/09/23 1300)   prismasol BGK 4/2.5 1,500 mL/hr at 01/09/23 1252   TPN (CLINIMIX)  Adult with Electrolyte Additives 82 mL/hr at 01/09/23 1300   vasopressin 0.03 Units/min (01/09/23 1300)   fentaNYL (SUBLIMAZE) injection, heparin, mouth rinse, silver nitrate applicators, sodium chloride flush, white petrolatum

## 2023-01-09 NOTE — Progress Notes (Signed)
OT Cancellation Note  Patient Details Name: Peter Oneill MRN: [id number] DOB: May 11, 1940   Cancelled Treatment:    Reason Eval/Treat Not Completed: Patient not medically ready (Pt intubated and unresponsive. OT signing off, please reorder as appropriate.)  Evern Bio 01/09/2023, 1:34 PM Berna Spare, OTR/L Acute Rehabilitation Services Office: [phone]

## 2023-01-09 NOTE — Progress Notes (Addendum)
NAME:  Peter Oneill, MRN:  [id number], DOB:  1940-10-27, LOS: 15 ADMISSION DATE:  12/13/2022, CONSULTATION DATE:  12/22/2022 REFERRING MD:  EDP, CHIEF COMPLAINT:  Abdominal Pain   History of Present Illness:  82 year old man with PMHx significant for ESRD, Asbestosis, CAD s/p CABG, HTN, HL, OSA and emphysema followed by Dr. [name] who presented to the ED with acutely worsening abdominal pain that began the day of admission.  He underwent dialysis in the morning and then felt short of breath followed by abdominal discomfort.  His initial oxygen saturation was 70% on EMS arrival and his work-up in the ED included a CT Abd/pelvis concerning for acute mesenteric ischemia secondary to occlusion of the celiac artery and significant stenosis of the superior mesenteric artery and small inferior mesenteric artery.  His labs showed a lactic acid of 1.4, WBC 6.3, creatinine 3.7, BNP >1300, normal Hgb.  He was taken emergently to the OR for urgent revascularization and SMA stenting along with ex-lap.  PCCM asked to admit  Pertinent Medical History:   Past Medical History:  Diagnosis Date   Asbestosis (HCC)    Contraindication to percutaneous coronary intervention (PCI)    Coronary artery disease    Dyslipidemia    Dyspnea    ESRD on hemodialysis (HCC)    History of anemia    Hypercholesterolemia    Hypertension    Ileus (HCC)    Pleural effusion on left    Pneumonia    Sleep apnea    Significant Hospital Events: Including procedures, antibiotic start and stop dates in addition to other pertinent events   10/31 Presented with mesenteric ischemia, occlusion of the celiac and significant stenosis of the SMA and small inferior mesenteric artery. Went to OR for urgent revascularization and SMA stenting along with ex-lap. Right hemicolectomy for necrotic cecum. Significant pressor requirement and bleeding (800 cc 1st hour) from abdominal wound vac post-op, given 4L PRBC's, 500cc albumin, 500cc NS and  was on Levophed , Neo , Vaso 0.04 and Epi . Pressors were titrated down with resuscitation.  11/1. CVL placed for pressors. LE warm . Weaning pressors. Orthostatic w/ positional change. 2.2 liters positive. Changing prop to dex. Drain output decreased  11/2 Back to OR for washout, end ileostomy, and closure 11/4 Pressors weaning. SBT  11/6 starting nepro  11/7 increasing TF from 20 to 40, hoping to pull some volume on CRRT prior to considering extubation. Completed zosyn  11/8 cuff leak, adv ETT 1cm. Remains on pressors and CRRT. Adding enteral pain meds to try to reduce continuous sedation burden   11/9 CO2 retention, backed off on opiates 11/10 improved mental status 11/11 on BiPAP 11/11-code stroke called-MRI negative for acute stroke 11/15-decreased alertness, pupils about 4 mm unreactive, code stroke called  Interim History / Subjective:  Pupils unreactive this morning Less responsive ABG obtained which did not show significant hypercarbia  Objective:  Blood pressure (!) 151/47, pulse 92, temperature (!) 102.1 F (38.9 C), temperature source Axillary, resp. rate (!) 33, height 5\' 9"  (1.753 m), weight 82.3 kg, SpO2 100%. CVP:  [13 mmHg] 13 mmHg  Vent Mode: PRVC FiO2 (%):  [40 %-50 %] 50 % Set Rate:  [24 bmp-30 bmp] 30 bmp Vt Set:  [560 mL] 560 mL PEEP:  [5 cmH20] 5 cmH20 Pressure Support:  [12 cmH20] 12 cmH20 Plateau Pressure:  [24 cmH20-27 cmH20] 24 cmH20   Intake/Output Summary (Last 24 hours) at 01/09/2023 1248 Last data filed at 01/09/2023 1200 Gross per 24 hour  Intake 4934.55 ml  Output 4036 ml  Net 898.55 ml   Filed Weights   01/07/23 0345 01/08/23 0500 01/09/23 0500  Weight: 85.4 kg 82 kg 82.3 kg   Physical Examination: Really, does not appear to be in distress Decreased air movement bilaterally S1-S2 appreciated Decreased alertness sedated with dilated pupils, unreactive  I reviewed nursing notes, Consultant notes, last 24 h vitals and pain  scores, last 48 h intake and output, last 24 h labs and trends, and last 24 h imaging results. Assessment & Plan:   Acute hypoxemic respiratory failure Continue mechanical ventilation -Target TVol 6-8cc/kgIBW -Target Plateau Pressure < 30cm H20 -Target driving pressure less than 15 cm of water -Target PaO2 55-65: titrate PEEP/FiO2 per protocol -Ventilator associated pneumonia prevention protocol  Is unlikely to be able to safely extubate as his respiratory status was worsening even prior to recent hospitalization  Underlying emphysema, history of asbestosis -Continue bronchodilators -Continue ventilator support  Ongoing encephalopathy -Code stroke was called -CT head, CTA negative for any evidence of acute stroke  Acute mesenteric ischemia/ischemic bowel -S/p exploratory laparotomy x 2, resection of large bowel with ileostomy and angioplasty of SMA occlusion. Completed course of Zosyn  On trickle feedings-currently at 30 cc -With increasing pressor needs, will hold tube feeds -CT scan of the abdomen ordered today -Appreciate surgery input  Acute blood loss anemia -Will continue to monitor -Remains on vasopressors -Transfusion as needed -Will hold anticoagulation  End-stage renal disease was on hemodialysis -Tolerating CRRT well -Appreciate renal service continue to follow  Paroxysmal atrial fibrillation Rate is controlled -Anticoagulation held at present for bleeding  Updated spouse and daughter He is overall not doing very well They will be in contact with other family members They do not believe he will want to be vent dependent in the nursing home and they will continue to talk with other family members  Best Practice: (right click and "Reselect all SmartList Selections" daily)   Diet/type: Continue TPN, trickle feeds, increased to 30 cc DVT prophylaxis:  heparin gtt GI prophylaxis: H2B Lines: Central line and Dialysis Catheter Foley:  N/A Code Status:  full  code Last date of multidisciplinary goals of care discussion: 11/14   The patient is critically ill with multiple organ systems failure and requires high complexity decision making for assessment and support, frequent evaluation and titration of therapies, application of advanced monitoring technologies and extensive interpretation of multiple databases. Critical Care Time devoted to patient care services described in this note independent of APP/resident time (if applicable)  is 40 minutes.   Virl Diamond MD [facility] Personal pager: See Amion If unanswered, please page CCM On-call: #[phone]

## 2023-01-09 NOTE — Progress Notes (Signed)
Subjective: Code Stroke called for an episode of unresponsiveness with virtually identical clinical features compared to the episode for which Neurology assessed him on 11/11, except for no hypercarbia on ABG today and bilateral pupillary dilatation instead of the unilateral pupillary dilatation seen with prior event.   Objective: Current vital signs: BP (!) 75/57   Pulse 100   Temp 98.3 F (36.8 C) (Oral)   Resp (!) 26   Ht 5\' 9"  (1.753 m)   Wt 82.3 kg   SpO2 100%   BMI 26.79 kg/m  Vital signs in last 24 hours: Temp:  [97.8 F (36.6 C)-98.6 F (37 C)] 98.3 F (36.8 C) (11/15 0823) Pulse Rate:  [57-100] 100 (11/15 0630) Resp:  [15-36] 26 (11/15 0630) BP: (36-149)/(11-57) 75/57 (11/15 0500) SpO2:  [93 %-100 %] 100 % (11/15 0630) Arterial Line BP: (69-119)/(35-60) 110/36 (11/15 0630) FiO2 (%):  [40 %-50 %] 50 % (11/15 0415) Weight:  [82.3 kg] 82.3 kg (11/15 0500)  Intake/Output from previous day: 11/14 0701 - 11/15 0700 In: 4649.2 [I.V.:2854.8; NG/GT:990; IV Piggyback:804.5] Out: 5466 [Drains:15; Stool:185] Intake/Output this shift: No intake/output data recorded. Nutritional status:  Diet Order             Diet NPO time specified  Diet effective now                   Physical Exam  HEENT:  [place]/AT Lungs: Intubated Extremities: Warm and well perfused.    Neurological Examination Mental Status: In an eyes-open unresponsive state. No spontaneous movements or attempts to communicate. Unresponsive to all external stimuli.  Cranial Nerves: II: No blink to threat bilaterally. Bilateral pupils 5 mm, round and sluggishly as well as minimally reactive. III,IV, VI: Eyes are conjugate and at the midline, slightly deviated upwards. No doll's eye reflex. No nystagmus.  V: No blink to eyelid stimulation VII: Face is flaccidly symmetric VIII: No response to voice IX,X: Gag reflex deferred XI: Head is midline XII: Not following commands for assessment Motor/Sensory: Flaccid  extremities x 4 with no movement to noxious stimuli.  Deep Tendon Reflexes: Hypoactive throughout Cerebellar/Gait: Unable to assess  Lab Results: Results for orders placed or performed during the hospital encounter of 12/18/2022 (from the past 48 hour(s))  POCT I-Stat EG7     Status: Abnormal   Collection Time: 01/07/23 10:40 AM  Result Value Ref Range   pH, Ven 7.249 (L) 7.25 - 7.43   pCO2, Ven 53.9 44 - 60 mmHg   pO2, Ven 54 (H) 32 - 45 mmHg   Bicarbonate 23.4 20.0 - 28.0 mmol/L   TCO2 25 22 - 32 mmol/L   O2 Saturation 81 %   Acid-base deficit 4.0 (H) 0.0 - 2.0 mmol/L   Sodium 132 (L) 135 - 145 mmol/L   Potassium 3.9 3.5 - 5.1 mmol/L   Calcium, Ion 1.28 1.15 - 1.40 mmol/L   HCT 26.0 (L) 39.0 - 52.0 %   Hemoglobin 8.8 (L) 13.0 - 17.0 g/dL   Patient temperature 40.9 F    Sample type VENOUS   Basic metabolic panel     Status: Abnormal   Collection Time: 01/07/23 10:43 AM  Result Value Ref Range   Sodium 131 (L) 135 - 145 mmol/L   Potassium 3.8 3.5 - 5.1 mmol/L   Chloride 101 98 - 111 mmol/L   CO2 21 (L) 22 - 32 mmol/L   Glucose, Bld 142 (H) 70 - 99 mg/dL    Comment: Glucose reference range applies only  to samples taken after fasting for at least 8 hours.   BUN 74 (H) 8 - 23 mg/dL   Creatinine, Ser 1.61 (H) 0.61 - 1.24 mg/dL   Calcium 8.8 (L) 8.9 - 10.3 mg/dL   GFR, Estimated 36 (L) >60 mL/min    Comment: (NOTE) Calculated using the CKD-EPI Creatinine Equation (2021)    Anion gap 9 5 - 15    Comment: Performed at [facility], [address] [place], [place] [address]  Glucose, capillary     Status: Abnormal   Collection Time: 01/07/23 11:42 AM  Result Value Ref Range   Glucose-Capillary 148 (H) 70 - 99 mg/dL    Comment: Glucose reference range applies only to samples taken after fasting for at least 8 hours.  Heparin level (unfractionated)     Status: Abnormal   Collection Time: 01/07/23 12:59 PM  Result Value Ref Range   Heparin Unfractionated 0.22 (L) 0.30 -  0.70 IU/mL    Comment: (NOTE) The clinical reportable range upper limit is being lowered to >1.10 to align with the FDA approved guidance for the current laboratory assay.  If heparin results are below expected values, and patient dosage has  been confirmed, suggest follow up testing of antithrombin III levels. Performed at [facility], [address] [place], [place] [address]   Glucose, capillary     Status: Abnormal   Collection Time: 01/07/23  3:37 PM  Result Value Ref Range   Glucose-Capillary 120 (H) 70 - 99 mg/dL    Comment: Glucose reference range applies only to samples taken after fasting for at least 8 hours.  Renal function panel (daily at 1600)     Status: Abnormal   Collection Time: 01/07/23  4:14 PM  Result Value Ref Range   Sodium 130 (L) 135 - 145 mmol/L   Potassium 4.5 3.5 - 5.1 mmol/L   Chloride 100 98 - 111 mmol/L   CO2 20 (L) 22 - 32 mmol/L   Glucose, Bld 154 (H) 70 - 99 mg/dL    Comment: Glucose reference range applies only to samples taken after fasting for at least 8 hours.   BUN 66 (H) 8 - 23 mg/dL   Creatinine, Ser 1.19 (H) 0.61 - 1.24 mg/dL   Calcium 8.7 (L) 8.9 - 10.3 mg/dL   Phosphorus 2.9 2.5 - 4.6 mg/dL   Albumin 2.4 (L) 3.5 - 5.0 g/dL   GFR, Estimated 42 (L) >60 mL/min    Comment: (NOTE) Calculated using the CKD-EPI Creatinine Equation (2021)    Anion gap 10 5 - 15    Comment: Performed at [facility], [address], [place], [place] [address]  Glucose, capillary     Status: Abnormal   Collection Time: 01/07/23  8:00 PM  Result Value Ref Range   Glucose-Capillary 154 (H) 70 - 99 mg/dL    Comment: Glucose reference range applies only to samples taken after fasting for at least 8 hours.  Heparin level (unfractionated)     Status: None   Collection Time: 01/07/23  9:25 PM  Result Value Ref Range   Heparin Unfractionated 0.34 0.30 - 0.70 IU/mL    Comment: (NOTE) The clinical reportable range upper limit is being lowered to  >1.10 to align with the FDA approved guidance for the current laboratory assay.  If heparin results are below expected values, and patient dosage has  been confirmed, suggest follow up testing of antithrombin III levels. Performed at [facility], [address], [place], [place] [address]   Hemoglobin and hematocrit, blood     Status: Abnormal   Collection Time: 01/07/23  9:25 PM  Result Value Ref Range   Hemoglobin 8.5 (L) 13.0 - 17.0 g/dL   HCT 60.4 (L) 54.0 - 98.1 %    Comment: Performed at [facility], [address], [place], [place] [address]  Glucose, capillary     Status: Abnormal   Collection Time: 01/07/23 11:35 PM  Result Value Ref Range   Glucose-Capillary 156 (H) 70 - 99 mg/dL    Comment: Glucose reference range applies only to samples taken after fasting for at least 8 hours.  Glucose, capillary     Status: Abnormal   Collection Time: 01/08/23  3:25 AM  Result Value Ref Range   Glucose-Capillary 145 (H) 70 - 99 mg/dL    Comment: Glucose reference range applies only to samples taken after fasting for at least 8 hours.  CBC     Status: Abnormal   Collection Time: 01/08/23  5:18 AM  Result Value Ref Range   WBC 15.4 (H) 4.0 - 10.5 K/uL   RBC 2.72 (L) 4.22 - 5.81 MIL/uL   Hemoglobin 8.5 (L) 13.0 - 17.0 g/dL   HCT 82.9 (L) 56.2 - 13.0 %   MCV 96.0 80.0 - 100.0 fL   MCH 31.3 26.0 - 34.0 pg   MCHC 32.6 30.0 - 36.0 g/dL   RDW 86.5 (H) 78.4 - 69.6 %   Platelets 166 150 - 400 K/uL   nRBC 0.9 (H) 0.0 - 0.2 %    Comment: Performed at [facility], [address], [place], [place] [address]  Renal function panel (daily at 0500)     Status: Abnormal   Collection Time: 01/08/23  5:18 AM  Result Value Ref Range   Sodium 130 (L) 135 - 145 mmol/L   Potassium 4.1 3.5 - 5.1 mmol/L   Chloride 100 98 - 111 mmol/L   CO2 21 (L) 22 - 32 mmol/L   Glucose, Bld 149 (H) 70 - 99 mg/dL    Comment: Glucose reference range applies only to samples taken after fasting  for at least 8 hours.   BUN 69 (H) 8 - 23 mg/dL   Creatinine, Ser 4.13 (H) 0.61 - 1.24 mg/dL   Calcium 9.0 8.9 - 24.4 mg/dL   Phosphorus 2.3 (L) 2.5 - 4.6 mg/dL   Albumin 2.2 (L) 3.5 - 5.0 g/dL   GFR, Estimated 52 (L) >60 mL/min    Comment: (NOTE) Calculated using the CKD-EPI Creatinine Equation (2021)    Anion gap 9 5 - 15    Comment: Performed at [facility], [address] [place], [place] [address]  Magnesium     Status: None   Collection Time: 01/08/23  5:18 AM  Result Value Ref Range   Magnesium 2.4 1.7 - 2.4 mg/dL    Comment: Performed at [facility], [address] [place], [place] [address]  Hepatic function panel     Status: Abnormal   Collection Time: 01/08/23  5:18 AM  Result Value Ref Range   Total Protein 6.3 (L) 6.5 - 8.1 g/dL   Albumin 2.2 (L) 3.5 - 5.0 g/dL   AST 440 (H) 15 - 41 U/L   ALT 174 (H) 0 - 44 U/L   Alkaline Phosphatase 330 (H) 38 - 126 U/L   Total Bilirubin 3.9 (H) <1.2 mg/dL   Bilirubin, Direct  2.8 (H) 0.0 - 0.2 mg/dL   Indirect Bilirubin 1.1 (H) 0.3 - 0.9 mg/dL    Comment: Performed at [facility], [address], [place], [place] [address]  Heparin level (unfractionated)     Status: None   Collection Time: 01/08/23  5:18 AM  Result Value Ref Range   Heparin Unfractionated 0.30 0.30 - 0.70 IU/mL    Comment: (NOTE) The clinical reportable range upper limit is being lowered to >1.10 to align with the FDA approved guidance for the current laboratory assay.  If heparin results are below expected values, and patient dosage has  been confirmed, suggest follow up testing of antithrombin III levels. Performed at [facility], [address] [place], [place] [address]   Glucose, capillary     Status: Abnormal   Collection Time: 01/08/23  7:18 AM  Result Value Ref Range   Glucose-Capillary 171 (H) 70 - 99 mg/dL    Comment: Glucose reference range applies only to samples taken after fasting for at least 8 hours.   Glucose, capillary     Status: Abnormal   Collection Time: 01/08/23 11:14 AM  Result Value Ref Range   Glucose-Capillary 133 (H) 70 - 99 mg/dL    Comment: Glucose reference range applies only to samples taken after fasting for at least 8 hours.  Glucose, capillary     Status: Abnormal   Collection Time: 01/08/23  3:14 PM  Result Value Ref Range   Glucose-Capillary 139 (H) 70 - 99 mg/dL    Comment: Glucose reference range applies only to samples taken after fasting for at least 8 hours.  Renal function panel (daily at 1600)     Status: Abnormal   Collection Time: 01/08/23  3:19 PM  Result Value Ref Range   Sodium 131 (L) 135 - 145 mmol/L   Potassium 4.2 3.5 - 5.1 mmol/L   Chloride 100 98 - 111 mmol/L   CO2 23 22 - 32 mmol/L   Glucose, Bld 150 (H) 70 - 99 mg/dL    Comment: Glucose reference range applies only to samples taken after fasting for at least 8 hours.   BUN 63 (H) 8 - 23 mg/dL   Creatinine, Ser 4.01 (H) 0.61 - 1.24 mg/dL   Calcium 9.1 8.9 - 02.7 mg/dL   Phosphorus 5.3 (H) 2.5 - 4.6 mg/dL   Albumin 2.3 (L) 3.5 - 5.0 g/dL   GFR, Estimated 55 (L) >60 mL/min    Comment: (NOTE) Calculated using the CKD-EPI Creatinine Equation (2021)    Anion gap 8 5 - 15    Comment: Performed at [facility], [address] [place], [place] [address]  Glucose, capillary     Status: Abnormal   Collection Time: 01/08/23  7:50 PM  Result Value Ref Range   Glucose-Capillary 139 (H) 70 - 99 mg/dL    Comment: Glucose reference range applies only to samples taken after fasting for at least 8 hours.  Hemoglobin and hematocrit, blood     Status: Abnormal   Collection Time: 01/08/23  8:06 PM  Result Value Ref Range   Hemoglobin 8.8 (L) 13.0 - 17.0 g/dL   HCT 44.0 (L) 34.7 - 42.5 %    Comment: Performed at [facility], [address] [place], [place] [address]  Protime-INR     Status: Abnormal   Collection Time: 01/08/23  8:32 PM  Result Value Ref Range   Prothrombin Time 16.9 (H)  11.4 - 15.2 seconds  INR 1.4 (H) 0.8 - 1.2    Comment: (NOTE) INR goal varies based on device and disease states. Performed at [facility], [address] [place], [place] [address]   APTT     Status: None   Collection Time: 01/08/23  8:32 PM  Result Value Ref Range   aPTT 30 24 - 36 seconds    Comment: Performed at [facility], [address] [place], [place] [address]  Type and screen [facility]     Status: None   Collection Time: 01/08/23  8:50 PM  Result Value Ref Range   ABO/RH(D) O POS    Antibody Screen NEG    Sample Expiration      01/11/2023,2359 Performed at [facility], [address], [place], [place] [address]   Glucose, capillary     Status: Abnormal   Collection Time: 01/08/23 11:58 PM  Result Value Ref Range   Glucose-Capillary 127 (H) 70 - 99 mg/dL    Comment: Glucose reference range applies only to samples taken after fasting for at least 8 hours.  Renal function panel (daily at 0500)     Status: Abnormal   Collection Time: 01/09/23  3:20 AM  Result Value Ref Range   Sodium 127 (L) 135 - 145 mmol/L   Potassium 5.0 3.5 - 5.1 mmol/L   Chloride 98 98 - 111 mmol/L   CO2 14 (L) 22 - 32 mmol/L   Glucose, Bld 162 (H) 70 - 99 mg/dL    Comment: Glucose reference range applies only to samples taken after fasting for at least 8 hours.   BUN 67 (H) 8 - 23 mg/dL   Creatinine, Ser 2.13 (H) 0.61 - 1.24 mg/dL   Calcium 8.9 8.9 - 08.6 mg/dL   Phosphorus 4.4 2.5 - 4.6 mg/dL   Albumin 2.1 (L) 3.5 - 5.0 g/dL   GFR, Estimated 53 (L) >60 mL/min    Comment: (NOTE) Calculated using the CKD-EPI Creatinine Equation (2021)    Anion gap 15 5 - 15    Comment: Performed at [facility], [address] [place], [place] [address]  Magnesium     Status: Abnormal   Collection Time: 01/09/23  3:20 AM  Result Value Ref Range   Magnesium 2.6 (H) 1.7 - 2.4 mg/dL    Comment: Performed at [facility], [address],  [place], [place] [address]  Hepatic function panel     Status: Abnormal   Collection Time: 01/09/23  3:20 AM  Result Value Ref Range   Total Protein 6.7 6.5 - 8.1 g/dL   Albumin 2.1 (L) 3.5 - 5.0 g/dL   AST 284 (H) 15 - 41 U/L   ALT 214 (H) 0 - 44 U/L   Alkaline Phosphatase 348 (H) 38 - 126 U/L   Total Bilirubin 4.5 (H) <1.2 mg/dL   Bilirubin, Direct 3.0 (H) 0.0 - 0.2 mg/dL   Indirect Bilirubin 1.5 (H) 0.3 - 0.9 mg/dL    Comment: Performed at [facility], [address], [place], [place] [address]  CBC     Status: Abnormal   Collection Time: 01/09/23  3:20 AM  Result Value Ref Range   WBC 15.8 (H) 4.0 - 10.5 K/uL   RBC 2.76 (L) 4.22 - 5.81 MIL/uL   Hemoglobin 8.6 (L) 13.0 - 17.0 g/dL   HCT 01.0 (L) 27.2 - 53.6 %   MCV 98.2 80.0 - 100.0 fL  MCH 31.2 26.0 - 34.0 pg   MCHC 31.7 30.0 - 36.0 g/dL   RDW 21.3 (H) 08.6 - 57.8 %   Platelets 187 150 - 400 K/uL   nRBC 4.9 (H) 0.0 - 0.2 %    Comment: Performed at [facility], [address] [place], [place] [address]  Triglycerides     Status: None   Collection Time: 01/09/23  3:20 AM  Result Value Ref Range   Triglycerides 101 <150 mg/dL    Comment: Performed at [facility], [address], [place], [place] [address]  Glucose, capillary     Status: Abnormal   Collection Time: 01/09/23  3:22 AM  Result Value Ref Range   Glucose-Capillary 161 (H) 70 - 99 mg/dL    Comment: Glucose reference range applies only to samples taken after fasting for at least 8 hours.  POCT I-Stat EG7     Status: Abnormal   Collection Time: 01/09/23  3:44 AM  Result Value Ref Range   pH, Ven 6.972 (LL) 7.25 - 7.43   pCO2, Ven 80.4 (HH) 44 - 60 mmHg   pO2, Ven 41 32 - 45 mmHg   Bicarbonate 18.7 (L) 20.0 - 28.0 mmol/L   TCO2 21 (L) 22 - 32 mmol/L   O2 Saturation 50 %   Acid-base deficit 13.0 (H) 0.0 - 2.0 mmol/L   Sodium 135 135 - 145 mmol/L   Potassium 4.9 3.5 - 5.1 mmol/L   Calcium, Ion 1.23 1.15 - 1.40 mmol/L   HCT 25.0 (L) 39.0 - 52.0  %   Hemoglobin 8.5 (L) 13.0 - 17.0 g/dL   Patient temperature 13.2 F    Sample type VENOUS   Glucose, capillary     Status: Abnormal   Collection Time: 01/09/23  5:21 AM  Result Value Ref Range   Glucose-Capillary 147 (H) 70 - 99 mg/dL    Comment: Glucose reference range applies only to samples taken after fasting for at least 8 hours.  I-STAT 7, (LYTES, BLD GAS, ICA, H+H)     Status: Abnormal   Collection Time: 01/09/23  8:06 AM  Result Value Ref Range   pH, Arterial 7.337 (L) 7.35 - 7.45   pCO2 arterial 41.5 32 - 48 mmHg   pO2, Arterial 86 83 - 108 mmHg   Bicarbonate 22.3 20.0 - 28.0 mmol/L   TCO2 24 22 - 32 mmol/L   O2 Saturation 96 %   Acid-base deficit 3.0 (H) 0.0 - 2.0 mmol/L   Sodium 133 (L) 135 - 145 mmol/L   Potassium 4.7 3.5 - 5.1 mmol/L   Calcium, Ion 1.22 1.15 - 1.40 mmol/L   HCT 27.0 (L) 39.0 - 52.0 %   Hemoglobin 9.2 (L) 13.0 - 17.0 g/dL   Patient temperature 44.0 F    Sample type ARTERIAL   Glucose, capillary     Status: Abnormal   Collection Time: 01/09/23  8:19 AM  Result Value Ref Range   Glucose-Capillary 141 (H) 70 - 99 mg/dL    Comment: Glucose reference range applies only to samples taken after fasting for at least 8 hours.    No results found for this or any previous visit (from the past 240 hour(s)).  Lipid Panel Recent Labs    01/09/23 0320  TRIG 101    Studies/Results: DG Chest Port 1 View  Addendum Date: 01/09/2023   ADDENDUM REPORT: 01/09/2023 05:00 ADDENDUM: Not mentioned above, there is no visible pneumothorax. Electronically Signed   By: Mellody Dance  Chesser M.D.   On: 01/09/2023 05:00   Result Date: 01/09/2023 CLINICAL DATA:  [id number] with acute respiratory failure, [id number] with pneumothorax. EXAM: PORTABLE CHEST 1 VIEW COMPARISON:  Portable chest 01/05/2023 FINDINGS: 4:21 a.m. NGT enters the stomach. The side port is in the proximal stomach and the tip would be in the body of stomach based on the position of the side port but is not in the field.  ETT tip is 5.4 cm from the carina, right IJ line in the mid SVC at the azygous confluence. An electrical pad overlies the left chest base. There are extensive calcified pleural plaques. Minimal pleural effusions are unchanged. Bilateral infrahilar streaky atelectasis or infiltrates show interval improvement. The remaining lungs are clear of focal opacities. There is cardiomegaly with coronary artery stents and CABG changes. No evidence of CHF. Stable mediastinum with aortic tortuosity and calcific plaque. No new osseous abnormality. IMPRESSION: 1. Interval improvement in bilateral infrahilar streaky atelectasis or infiltrates. 2. Stable minimal pleural effusions and cardiomegaly. 3. Stable support apparatus. 4. Extensive calcified pleural plaques. 5. Aortic atherosclerosis. Electronically Signed: By: Almira Bar M.D. On: 01/09/2023 04:45    Medications: Scheduled:  arformoterol  15 mcg Nebulization BID   aspirin  81 mg Per Tube Daily   atropine       Chlorhexidine Gluconate Cloth  6 each Topical Daily   clopidogrel  75 mg Per Tube Daily   darbepoetin (ARANESP) injection - DIALYSIS  40 mcg Subcutaneous Q Mon-1800   doxercalciferol  4 mcg Intravenous Q M,W,F   feeding supplement (PROSource TF20)  60 mL Per Tube QID   gabapentin  300 mg Per Tube QHS   insulin aspart  0-15 Units Subcutaneous Q4H   latanoprost  1 drop Both Eyes QHS   metoCLOPramide (REGLAN) injection  5 mg Intravenous Q8H   midodrine  15 mg Per Tube Q8H   mouth rinse  15 mL Mouth Rinse Q2H   revefenacin  175 mcg Nebulization Daily   rosuvastatin  10 mg Per Tube Daily   sodium chloride flush  10-40 mL Intracatheter Q12H   Continuous:   prismasol BGK 4/2.5 400 mL/hr at 01/09/23 0342    prismasol BGK 4/2.5 400 mL/hr at 01/09/23 0342   epinephrine 10 mcg/min (01/09/23 0838)   famotidine (PEPCID) IV Stopped (01/09/23 0335)   feeding supplement (VITAL 1.5 CAL) 30 mL/hr at 01/09/23 0600   norepinephrine (LEVOPHED) Adult infusion 50  mcg/min (01/09/23 0600)   prismasol BGK 4/2.5 1,500 mL/hr at 01/09/23 0517   TPN (CLINIMIX) Adult with Electrolyte Additives 82 mL/hr at 01/09/23 0600   vasopressin 0.03 Units/min (01/09/23 0606)    Assessment: 82 y.o. male with a PMHx of asbestosis, CAD s/p CABG, OSA, pulmonary asbestosis, dyslipidemia, ESRD on HD, HTN, prior carotid endarterectomy and PVD who is status post resection of his right colon, SMA revascularization with angioplasty and stenting. He had an episode on 11/11 where he was observed to suddenly decompensate with inability to follow any commands and was uncommunicative, with asymmetric pupils. Neurology was called to the bedside STAT. Exam revealed an unresponsive patient with eyes open, a dilated and unreactive left pupil, 3 mm and reactive right pupil, flaccid extremities x 4 and no responses to any external stimuli. Code Stroke was then called and the patient was rushed to CT, which showed no evidence of acute intracranial abnormality, with chronic left frontal lobe encephalomalacia noted. He then spontaneously improved. ABG revealed hypercarbia and his acute decompensation was determined most likely to be  secondary to CO2 narcosis. Today, Code Stroke called for another episode of unresponsiveness with virtually identical clinical features compared to the episode for which Neurology assessed him on 11/11, except for no hypercarbia on ABG today and bilateral pupillary dilatation instead of the unilateral pupillary dilatation seen with prior event. His EEG on 11/12 revealed continuous generalized slowing without asymmetry; no electrographic seizures or epileptiform discharges were seen.  - Exam reveals an eyes-open unresponsive state with bilateral dilated, sluggishly and minimally reactive pupils, no doll's eye reflex and no limb movement to any noxious stimuli. NIHSS 31.  - CT head: No evidence of acute intracranial abnormality.  ASPECTS of 10. Chronic left frontal lobe infarct. -  He is again not a TNK candidate due to recent major surgery - CTA of head and neck: No intracranial large vessel occlusion. Severe focal stenosis at the left ICA as it enters the skull base. Moderate (70%) narrowing in the proximal left ICA. Severe narrowing at the origin of the right vertebral artery. Bilateral supraclinoid ICAs and M1 segments are small in caliber.  - Currently on ASA and Plavix - DDx for acute worsening is felt most likely to be secondary to severe toxic/metabolic encephalopathy, versus possible cognitive fluctuation in the context of his severely debilitated state. Subclinical status is also possible but is lower on the DDx.  - Partial improvement of his exam noted after CT, with patient beginning to move limbs and blink eyes.    Recommendations: - Will not obtain repeat MRI due to recent unrevealing MRI after his episode earlier this week, as well as low suspicion for stroke given gradual partial improvement in his exam after CT.  - Repeat STAT EEG (ordered) - Frequent neuro checks  Addendum: Repeat STAT EEG: Continuous generalized slowing. Findings are suggestive of moderate to severe diffuse encephalopathy. No seizures or epileptiform discharges were seen throughout the recording.   40 minutes spent in the emergent neurological evaluation and management of this critically ill patient   LOS: 15 days   @Electronically  signed: Dr. Caryl Pina 01/09/2023  9:06 AM

## 2023-01-09 NOTE — Code Documentation (Signed)
Stroke Response Nurse Documentation Code Documentation  Peter Oneill is a 82 y.o. male admitted to [facility]  on 12/10/2022 for worsening abdominal pain with past medical hx of end stage renal disease on T/Th/S HD, pulmonary asbestosis, hypertension, hypercholesterolemia, CAD status post CABG, and obstructive sleep apnea. On aspirin 81 mg daily and clopidogrel 75 mg daily. Code stroke was activated by 75M staff.   Patient on 75M unit where he was LKW at midnight per chart. Patient became hypotensive while on levophed drip at 0345. Epi IV x3 given with atropine, bicarb and albumin. Epi drip started. BP returned to normal. Noted to be unresponsive with bilateral fixed dialated pupils after shift change.  Previous code stroke activated 11/11 when patient became unresponsive with left pupil dilated and unreactive.  Stroke team at the bedside after patient activation. Patient to CT with team. NIHSS 31, see documentation for details and code stroke times. Patient with decreased LOC, disoriented, not following commands, bilateral hemianopia, bilateral arm weakness, bilateral leg weakness, bilateral decreased sensation, Global aphasia , dysarthria , and Visual  neglect on exam. The following imaging was completed:  CT Head and CTA. Patient is not a candidate for IV Thrombolytic due to out unclear time of LKW. Patient is not a candidate for IR due to no LVO. Patient is moving a little post CT.   Care/Plan: cancel code stroke, Dr. Otelia Limes to order EEG.   Bedside handoff with RN.    Ferman Hamming Stroke Response RN

## 2023-01-09 NOTE — Progress Notes (Signed)
eLink Physician-Brief Progress Note Patient Name: Peter Oneill DOB: 1940-11-04 MRN: [id number]   Date of Service  01/09/2023  HPI/Events of Note  Patient became profoundly hypotensive and failed to respond to escalating doses of Levophed gtt and the addition of an Epinephrine gtt, he then received three escalating boluses of IV Epinephrine , along with an amp of sodium bicarbonate, and 250 ml of 5 % albumin with a return of his blood pressure to the normal range, bilateral chest auscultation was clear and symmetrical, ABG, electrolytes and a stat hemoglobin have been ordered and results are pending, will also obtain a stat portable CXR for completeness, arterial line was ordered for closer monitoring of the blood pressure.  eICU Interventions  See above.        Thomasene Lot Debbie Yearick 01/09/2023, 3:44 AM

## 2023-01-09 NOTE — Progress Notes (Signed)
RT assisted with patient transport to CT and returned to 3M08 without complications.

## 2023-01-09 NOTE — Progress Notes (Signed)
Patient ID: Peter Oneill, male   DOB: 07-Sep-1940, 82 y.o.   MRN: [id number] [facility] Surgery Progress Note  13 Days Post-Op  Subjective: CC-  Overnight events noted. Worsening hypotension and less responsive. Now on Epi gtt, increasing pressor Levo 31 Vaso 0.03 He also had bleeding from his midline wound, thrombin pad and pressure dressing applied with hemostasis.   Objective: Vital signs in last 24 hours: Temp:  [97.8 F (36.6 C)-98.6 F (37 C)] 98 F (36.7 C) (11/15 0352) Pulse Rate:  [57-100] 100 (11/15 0630) Resp:  [15-36] 26 (11/15 0630) BP: (36-149)/(11-57) 75/57 (11/15 0500) SpO2:  [93 %-100 %] 100 % (11/15 0630) Arterial Line BP: (69-119)/(35-60) 110/36 (11/15 0630) FiO2 (%):  [40 %-50 %] 50 % (11/15 0415) Weight:  [82.3 kg] 82.3 kg (11/15 0500) Last BM Date : 01/08/23  Intake/Output from previous day: 11/14 0701 - 11/15 0700 In: 4649.2 [I.V.:2854.8; NG/GT:990; IV Piggyback:804.5] Out: 5466 [Drains:15; Stool:185] Intake/Output this shift: No intake/output data recorded.  PE: General appearance: critically ill appearing on vent GI: soft, cdi dressing to midline wound (thrombin/pressure dressing applied over night for bleeding so I did not take this down), ileostomy mucosa difficult to see/ appears to be sloughing some and a bit retracted but having good stool output, ostomy appliance filled with brown liquid stool and air. drain with scant serosang output             Ostomy - 185 cc             JP - 15 SS   Lab Results:  Recent Labs    01/08/23 0518 01/08/23 2006 01/09/23 0320 01/09/23 0344  WBC 15.4*  --  15.8*  --   HGB 8.5*   < > 8.6* 8.5*  HCT 26.1*   < > 27.1* 25.0*  PLT 166  --  187  --    < > = values in this interval not displayed.   BMET Recent Labs    01/08/23 1519 01/09/23 0320 01/09/23 0344  NA 131* 127* 135  K 4.2 5.0 4.9  CL 100 98  --   CO2 23 14*  --   GLUCOSE 150* 162*  --   BUN 63* 67*  --   CREATININE 1.30* 1.34*  --    CALCIUM 9.1 8.9  --    PT/INR Recent Labs    01/08/23 2032  LABPROT 16.9*  INR 1.4*   CMP     Component Value Date/Time   NA 135 01/09/2023 0344   K 4.9 01/09/2023 0344   CL 98 01/09/2023 0320   CO2 14 (L) 01/09/2023 0320   GLUCOSE 162 (H) 01/09/2023 0320   BUN 67 (H) 01/09/2023 0320   CREATININE 1.34 (H) 01/09/2023 0320   CALCIUM 8.9 01/09/2023 0320   CALCIUM 8.2 (L) 05/15/2021 0750   PROT 6.7 01/09/2023 0320   PROT 6.6 03/17/2016 1226   ALBUMIN 2.1 (L) 01/09/2023 0320   ALBUMIN 2.1 (L) 01/09/2023 0320   ALBUMIN 4.0 03/17/2016 1226   AST 311 (H) 01/09/2023 0320   ALT 214 (H) 01/09/2023 0320   ALKPHOS 348 (H) 01/09/2023 0320   BILITOT 4.5 (H) 01/09/2023 0320   BILITOT 0.4 03/17/2016 1226   GFRNONAA 53 (L) 01/09/2023 0320   Lipase     Component Value Date/Time   LIPASE 41 12/06/2022 1603       Studies/Results: DG Chest Port 1 View  Addendum Date: 01/09/2023   ADDENDUM REPORT: 01/09/2023 05:00 ADDENDUM: Not mentioned above, there  is no visible pneumothorax. Electronically Signed   By: Almira Bar M.D.   On: 01/09/2023 05:00   Result Date: 01/09/2023 CLINICAL DATA:  5626 with acute respiratory failure, [id number] with pneumothorax. EXAM: PORTABLE CHEST 1 VIEW COMPARISON:  Portable chest 01/05/2023 FINDINGS: 4:21 a.m. NGT enters the stomach. The side port is in the proximal stomach and the tip would be in the body of stomach based on the position of the side port but is not in the field. ETT tip is 5.4 cm from the carina, right IJ line in the mid SVC at the azygous confluence. An electrical pad overlies the left chest base. There are extensive calcified pleural plaques. Minimal pleural effusions are unchanged. Bilateral infrahilar streaky atelectasis or infiltrates show interval improvement. The remaining lungs are clear of focal opacities. There is cardiomegaly with coronary artery stents and CABG changes. No evidence of CHF. Stable mediastinum with aortic tortuosity  and calcific plaque. No new osseous abnormality. IMPRESSION: 1. Interval improvement in bilateral infrahilar streaky atelectasis or infiltrates. 2. Stable minimal pleural effusions and cardiomegaly. 3. Stable support apparatus. 4. Extensive calcified pleural plaques. 5. Aortic atherosclerosis. Electronically Signed: By: Almira Bar M.D. On: 01/09/2023 04:45    Anti-infectives: Anti-infectives (From admission, onward)    Start     Dose/Rate Route Frequency Ordered Stop   12/26/22 1800  piperacillin-tazobactam (ZOSYN) IVPB 3.375 g        3.375 g 12.5 mL/hr over 240 Minutes Intravenous Every 8 hours 12/26/22 1615 01/01/23 2110   12/20/2022 2200  piperacillin-tazobactam (ZOSYN) IVPB 2.25 g  Status:  Discontinued        2.25 g 100 mL/hr over 30 Minutes Intravenous Every 8 hours 12/05/2022 2112 12/26/22 1615        Assessment/Plan POD 13, s/p right hemicolectomy with ileostomy secondary to Acute mesenteric ischemia by Dr. Azucena Cecil 11/1 just after midnight. Left in discontinuity with abthera. Also underwent revascularization with angioplasty of SMA by Dr. Lenell Antu.  -S/p washout, end ileostomy, JP drain placement and abdominal closure 11/2 - ileostomy productive - WBC up 15.8, afebrile.  Will obtain CT a/p today. He is getting a CT of his head as well. Hold tube feedings with worsening pressor requirements. Continue TPN - bleeding from midline hemostatic after thrombin/pressure dressing, do not change this today unless bleeding recurs and plan to change again tomorrow 11/16 - continue JP - on plavix   ID - zosyn 10/31>>11/7 FEN - NPO, hold TF, TPN DVT - SCDs, Plavix Dispo - ICU     LOS: 15 days    Franne Forts, [facility] Surgery 01/09/2023, 8:22 AM Please see Amion for pager number during day hours 7:00am-4:30pm

## 2023-01-09 NOTE — Progress Notes (Signed)
RT X 2 made three attempts to place arterial line but was unsuccessful. PA in room is going to use ultrasound and place arterial line.

## 2023-01-10 ENCOUNTER — Inpatient Hospital Stay (HOSPITAL_COMMUNITY): Payer: Medicare HMO

## 2023-01-10 DIAGNOSIS — K55059 Acute (reversible) ischemia of intestine, part and extent unspecified: Secondary | ICD-10-CM | POA: Diagnosis not present

## 2023-01-10 DIAGNOSIS — J9601 Acute respiratory failure with hypoxia: Secondary | ICD-10-CM | POA: Diagnosis not present

## 2023-01-10 DIAGNOSIS — N186 End stage renal disease: Secondary | ICD-10-CM | POA: Diagnosis not present

## 2023-01-10 DIAGNOSIS — Z9889 Other specified postprocedural states: Secondary | ICD-10-CM | POA: Diagnosis not present

## 2023-01-10 LAB — CBC
HCT: 23.8 % — ABNORMAL LOW (ref 39.0–52.0)
Hemoglobin: 7.9 g/dL — ABNORMAL LOW (ref 13.0–17.0)
MCH: 31.7 pg (ref 26.0–34.0)
MCHC: 33.2 g/dL (ref 30.0–36.0)
MCV: 95.6 fL (ref 80.0–100.0)
Platelets: 165 10*3/uL (ref 150–400)
RBC: 2.49 MIL/uL — ABNORMAL LOW (ref 4.22–5.81)
RDW: 20.3 % — ABNORMAL HIGH (ref 11.5–15.5)
WBC: 16.3 10*3/uL — ABNORMAL HIGH (ref 4.0–10.5)
nRBC: 4.3 % — ABNORMAL HIGH (ref 0.0–0.2)

## 2023-01-10 LAB — HEPATIC FUNCTION PANEL
ALT: 263 U/L — ABNORMAL HIGH (ref 0–44)
AST: 465 U/L — ABNORMAL HIGH (ref 15–41)
Albumin: 1.9 g/dL — ABNORMAL LOW (ref 3.5–5.0)
Alkaline Phosphatase: 322 U/L — ABNORMAL HIGH (ref 38–126)
Bilirubin, Direct: 3.6 mg/dL — ABNORMAL HIGH (ref 0.0–0.2)
Indirect Bilirubin: 1.6 mg/dL — ABNORMAL HIGH (ref 0.3–0.9)
Total Bilirubin: 5.2 mg/dL — ABNORMAL HIGH (ref <1.2)
Total Protein: 5.8 g/dL — ABNORMAL LOW (ref 6.5–8.1)

## 2023-01-10 LAB — DIC (DISSEMINATED INTRAVASCULAR COAGULATION)PANEL
D-Dimer, Quant: 11.89 ug{FEU}/mL — ABNORMAL HIGH (ref 0.00–0.50)
Fibrinogen: 787 mg/dL — ABNORMAL HIGH (ref 210–475)
INR: 1.7 — ABNORMAL HIGH (ref 0.8–1.2)
Platelets: 167 10*3/uL (ref 150–400)
Prothrombin Time: 20.2 s — ABNORMAL HIGH (ref 11.4–15.2)
Smear Review: NONE SEEN
aPTT: 31 s (ref 24–36)

## 2023-01-10 LAB — RENAL FUNCTION PANEL
Albumin: 1.9 g/dL — ABNORMAL LOW (ref 3.5–5.0)
Anion gap: 9 (ref 5–15)
BUN: 69 mg/dL — ABNORMAL HIGH (ref 8–23)
CO2: 19 mmol/L — ABNORMAL LOW (ref 22–32)
Calcium: 8.9 mg/dL (ref 8.9–10.3)
Chloride: 104 mmol/L (ref 98–111)
Creatinine, Ser: 1.06 mg/dL (ref 0.61–1.24)
GFR, Estimated: >60 mL/min (ref >60)
Glucose, Bld: 160 mg/dL — ABNORMAL HIGH (ref 70–99)
Phosphorus: 2.7 mg/dL (ref 2.5–4.6)
Potassium: 4 mmol/L (ref 3.5–5.1)
Sodium: 132 mmol/L — ABNORMAL LOW (ref 135–145)

## 2023-01-10 LAB — PROTIME-INR
INR: 1.7 — ABNORMAL HIGH (ref 0.8–1.2)
Prothrombin Time: 20.1 s — ABNORMAL HIGH (ref 11.4–15.2)

## 2023-01-10 LAB — GLUCOSE, CAPILLARY
Glucose-Capillary: 149 mg/dL — ABNORMAL HIGH (ref 70–99)
Glucose-Capillary: 148 mg/dL — ABNORMAL HIGH (ref 70–99)
Glucose-Capillary: 159 mg/dL — ABNORMAL HIGH (ref 70–99)

## 2023-01-10 LAB — MAGNESIUM: Magnesium: 2.4 mg/dL (ref 1.7–2.4)

## 2023-01-10 LAB — APTT: aPTT: 32 s (ref 24–36)

## 2023-01-10 MED ORDER — ACETAMINOPHEN 325 MG PO TABS: 650.0000 mg | ORAL_TABLET | Freq: Four times a day (QID) | ORAL | Status: DC | PRN

## 2023-01-10 MED ORDER — TRACE MINERALS CU-MN-SE-ZN 300-55-60-3000 MCG/ML IV SOLN
INTRAVENOUS | Status: DC
  Filled 2023-01-10: qty 2000

## 2023-01-10 MED ORDER — DARBEPOETIN ALFA 60 MCG/0.3ML IJ SOSY: 60.0000 ug | PREFILLED_SYRINGE | INTRAMUSCULAR | Status: DC

## 2023-01-10 MED ORDER — FAT EMUL FISH OIL/PLANT BASED 20% (SMOFLIPID)IV EMUL
250.0000 mL | INTRAVENOUS | Status: DC
  Filled 2023-01-10: qty 250

## 2023-01-10 MED ORDER — MIDAZOLAM HCL 2 MG/2ML IJ SOLN: 2.0000 mg | INTRAMUSCULAR | Status: DC | PRN

## 2023-01-10 MED ORDER — ACETAMINOPHEN 650 MG RE SUPP: 650.0000 mg | Freq: Four times a day (QID) | RECTAL | Status: DC | PRN

## 2023-01-10 MED ORDER — MORPHINE SULFATE (PF) 2 MG/ML IV SOLN: 2.0000 mg | INTRAVENOUS | Status: DC | PRN

## 2023-01-10 MED ORDER — SODIUM CHLORIDE 0.9% FLUSH: 10.0000 mL | Freq: Two times a day (BID) | INTRAVENOUS | Status: DC

## 2023-01-10 MED ORDER — SODIUM PHOSPHATES 45 MMOLE/15ML IV SOLN
10.0000 mmol | Freq: Once | INTRAVENOUS | Status: DC
  Administered 2023-01-10: 10 mmol via INTRAVENOUS
  Filled 2023-01-10: qty 3.33

## 2023-01-10 MED ORDER — GLYCOPYRROLATE 0.2 MG/ML IJ SOLN
0.2000 mg | INTRAMUSCULAR | Status: DC | PRN
Start: 2023-01-10 — End: 2023-01-10

## 2023-01-10 MED ORDER — MORPHINE 100MG IN NS 100ML (1MG/ML) PREMIX INFUSION
0.0000 mg/h | INTRAVENOUS | Status: DC
  Administered 2023-01-10: 5 mg/h via INTRAVENOUS
  Filled 2023-01-10: qty 100

## 2023-01-10 MED ORDER — POLYVINYL ALCOHOL 1.4 % OP SOLN
1.0000 [drp] | Freq: Four times a day (QID) | OPHTHALMIC | Status: DC | PRN
Start: 2023-01-10 — End: 2023-01-10

## 2023-01-10 MED ORDER — GLYCOPYRROLATE 0.2 MG/ML IJ SOLN: 0.2000 mg | INTRAMUSCULAR | Status: DC | PRN

## 2023-01-10 MED ORDER — MORPHINE BOLUS VIA INFUSION
5.0000 mg | INTRAVENOUS | Status: DC | PRN
Start: 2023-01-10 — End: 2023-01-10
  Administered 2023-01-10: 5 mg via INTRAVENOUS

## 2023-01-10 MED ORDER — GLYCOPYRROLATE 1 MG PO TABS
1.0000 mg | ORAL_TABLET | ORAL | Status: DC | PRN
Start: 2023-01-10 — End: 2023-01-10

## 2023-01-14 ENCOUNTER — Ambulatory Visit: Payer: Medicare HMO | Admitting: Pulmonary Disease

## 2023-01-25 NOTE — Progress Notes (Signed)
Patient terminally extubated to 2lnc per NP request. RN at bedside. RT will continue to monitor.

## 2023-01-25 NOTE — Progress Notes (Signed)
PHARMACY - TOTAL PARENTERAL NUTRITION CONSULT NOTE  Indication:  bowel discontinuity  Patient Measurements: Height: 5\' 9"  (175.3 cm) Weight: 82.3 kg (181 lb 7 oz) IBW/kg (Calculated) : 70.7 TPN AdjBW (KG): 81.2 Body mass index is 26.79 kg/m.  Assessment:  36 YOM with moderate malnutrition due to chronic illness including ESRD on HD who presented with acute mesenteric ischemia s/p ex lap with R hemicolectomy, angioplasty and stenting of SMA, wound VAC placement, and bowel left in discontinuity on 11/1. Pharmacy consulted for TPN.   Pt returned to OR on 11/2 for washout with end ileostomy, JP drain placement, and abdominal wall closure 11/2. Patient was initiated on TF on 11/6 and was increased to 35mL/hr (goal 50 mL/hr). TF were paused 11/9 afternoon due to emesis - NG back to suction. CRRT discontinued 11/10. Patient acutely decompensated on 11/11 PM and a code stroke was called without any acute findings on [facility] or MRI and patient was reintubated. Restarted TF at 57ml/hr on 11/12 AM and CRRT was reinitiated 11/12. TF increased to 64ml/hr on 11/13. Hold further advancement of TF until pressor requirements are down.   Glucose / Insulin: no hx DM - CBGs <180. 13 units SSI/24hrs.  Electrolytes: Na 130, K 4.1, Phos 2.3 (s/p NaPhos + ~69mmol NaPhos from TPN), Mg 2.4, coCa 10.4 Renal: ESRD on HD TTS.  CRRT 11/1 >> 11/10, resumed 11/12 >> Hepatic: AST/ALT up 273/174. Tbili up 3.9 (no jaundice noted). AlkPhos up 330. Albumin 2.2. TG 52 (11/11) Intake / Output; MIVF: JP drain output 40mL. Ileostomy output . Net +6.1 L GI Imaging: 11/10 KUB - nonobstructive bowel gas pattern GI Surgeries / Procedures: none since start of TPN  Central access: CVC (double lumen) placed 12/26/22 TPN start date: 12/28/22  Nutritional Goals: Clinimix 8/10 2 L w/o electrolytes - SMOFlipid 250 mL daily Will provide 100% AA + 85% kcal needs  RD Estimated Needs Total Energy Estimated Needs: 2100-2400  kcals Total Protein Estimated Needs: 145-180 g Total Fluid Estimated Needs: 1 L + UOP  Current Nutrition:  TPN TF 61ml/hr (since 11/13), goal rate 7ml/hr  Plan:  Continue Clinimix 8/10 at 82 ml/hr and SMOFlipid at 20.8 ml/hr x 12 hrs to provide 100% AA and 85% of kCal needs Add NaPhos 30 mmol in daily Clinimix while on CRRT Add MVI back to TPN - CCS prefers to have MVI in TPN vs PT for now Half trace elements in TPN d/t elevated tbili, no chromium d/t shortage Continue moderate SSI Q4H Standard TPN labs Mon/Thurs >> Renal panel BID per Renal F/u tolerance to TF and ability to advance to goal TF and wean TPN

## 2023-01-25 NOTE — Death Summary Note (Signed)
DEATH SUMMARY   Patient Details  Name: Peter Oneill MRN: [id number] DOB: 08-May-1940  Admission/Discharge Information   Admit Date:  01/14/23  Date of Death: Date of Death: 01-30-2023  Time of Death: Time of Death: 1640  Length of Stay: 01-30-23  Referring Physician: Sigmund Hazel, MD   Reason(s) for Hospitalization  Acute respiratory failure with hypoxia Severe COPD Acute metabolic encephalopathy Acute mesenteric ischemia/ischemic bowel status post ex lap x 2 proximal resection of large bowel with ileostomy and angioplasty of SMA stenosis Septic shock, POA Shock liver Acute blood loss anemia End-stage renal disease on hemodialysis Acute metabolic acidosis Paroxysmal A-fib  Diagnoses  Preliminary cause of death: Withdrawal of care in the setting of multisystem organ failure Secondary Diagnoses (including complications and co-morbidities):  Principal Problem:   Status post vascular surgery Active Problems:   Acute mesenteric ischemia (HCC)   Shock (HCC)   Mesenteric ischemia (HCC)   Malnutrition of moderate degree   Acute respiratory failure with hypoxia and hypercapnia (HCC)   ESRD (end stage renal disease) on dialysis (HCC)   Septic shock (HCC)   Glasgow coma scale total score 3-8 [facility])   Brief Hospital Course (including significant findings, care, treatment, and services provided and events leading to death)  Peter Oneill is a 82 y.o. year old male with PMHx significant for ESRD, Asbestosis, CAD s/p CABG, HTN, HL, OSA and emphysema followed by Dr. [name] who presented to the ED with acutely worsening abdominal pain that began the day of admission.  He underwent dialysis in the morning and then felt short of breath followed by abdominal discomfort.  His initial oxygen saturation was 70% on EMS arrival and his work-up in the ED included a CT Abd/pelvis concerning for acute mesenteric ischemia secondary to occlusion of the celiac artery and significant stenosis of the  superior mesenteric artery and small inferior mesenteric artery.  His labs showed a lactic acid of 1.4, WBC 6.3, creatinine 3.7, BNP >1300, normal Hgb.  He was taken emergently to the OR for urgent revascularization and SMA stenting along with ex-lap with resection of large bowel, he remained in shock postoperatively, on full support mechanical ventilator, has end-stage renal disease, was started on CRRT due to vasopressor requirement, he was taken back to the OR for complete resection of large bowel and terminal ileum with ileostomy.  Patient never really improved after surgical procedure, remained in severe metabolic acidosis, with persistent vasopressor requirement more than 2 weeks.  His mental status started getting worse, he became completely unresponsive.  Stroke workup was done which was negative Goals of care discussions were carried with family, patient's family decided to proceed with comfort care considering not much improvement and continues to decline.  Patient was placed on comfort care, he was palliatively extubated, he passed on 01/30/2023 at 4:40 PM.  Patient's family was at bedside   Pertinent Labs and Studies  Significant Diagnostic Studies DG Chest Port 1 View  Result Date: 01-30-2023 CLINICAL DATA:  Hemoptysis.  Post vascular surgery EXAM: PORTABLE CHEST 1 VIEW COMPARISON:  Yesterday FINDINGS: Endotracheal tube with tip just below the clavicular heads. Enteric tube tip reaches the stomach with side port near the GE junction. Right IJ line with tip at the upper SVC. Bulky calcified pleural plaques bilaterally with small left more than right pleural effusion. Superimposed hazy airspace density at the bases. No pneumothorax. IMPRESSION: 1. Unremarkable hardware. 2. Right lower lobe atelectasis or pneumonia as seen on abdominal CT from yesterday. Left base opacity from scarring  and small pleural effusion. 3. Asbestos related pleural disease. Electronically Signed   By: Tiburcio Pea M.D.    On: 01/17/2023 04:38   CT ABDOMEN PELVIS W CONTRAST  Result Date: 01/09/2023 CLINICAL DATA:  Intra-abdominal abscess, history of ileostomy and laparotomy 01/05/2023 EXAM: CT ABDOMEN AND PELVIS WITH CONTRAST TECHNIQUE: Multidetector CT imaging of the abdomen and pelvis was performed using the standard protocol following bolus administration of intravenous contrast. RADIATION DOSE REDUCTION: This exam was performed according to the departmental dose-optimization program which includes automated exposure control, adjustment of the mA and/or kV according to patient size and/or use of iterative reconstruction technique. CONTRAST:  75mL OMNIPAQUE IOHEXOL 350 MG/ML SOLN COMPARISON:  12/15/2022, 01/04/2023 FINDINGS: Lower chest: Continue bilateral calcified pleural plaques. Stable loculated left pleural effusion. Increased dependent bilateral lower lobe consolidation may reflect atelectasis or airspace disease. Stable cardiomegaly without pericardial effusion. Hepatobiliary: Liver is grossly unremarkable without focal abnormality. Calcified gallstones without cholecystitis. No biliary duct dilation. Pancreas: Unremarkable. No pancreatic ductal dilatation or surrounding inflammatory changes. Spleen: Normal in size without focal abnormality. Adrenals/Urinary Tract: Stable bilateral renal cortical atrophy. No urinary tract calculi or obstructive uropathy. The adrenals are unremarkable. Bladder is decompressed, limiting its evaluation. Stomach/Bowel: Postsurgical changes from right hemicolectomy and diverting ileostomy right lower quadrant. No bowel obstruction or ileus. Enteric catheter tip within the gastric lumen. No bowel wall thickening or inflammatory change. Scattered diverticulosis throughout the distal colon without diverticulitis. Vascular/Lymphatic: Stable diffuse atherosclerosis of the aorta and its branches. Right common femoral arterial catheter tip extends into the right external iliac artery. There is a  left common femoral venous catheter, tip extending to the left common iliac vein. Interval placement of a stent within the superior mesenteric artery origin. No pathologic adenopathy within the abdomen or pelvis. Reproductive: Prostate is unremarkable. Other: A surgical drain is seen entering the abdomen in the right lower quadrant, tip extending to the right infra hepatic region. There is a small amount of loculated fluid along the distal margin of the surgical drain, measuring up to 1.3 x 4.1 cm reference image 42/3. This fluid extends along the anterior margin of the liver capsule, measuring up to 1 cm maximal thickness. This fluid demonstrates minimal peripheral enhancement without other features to suggest definitive abscess formation. The surgical drain appears to traverse this fluid. Continued follow-up recommended. No free intraperitoneal gas. Postsurgical changes from midline laparotomy with surgical packing at the operative site. Musculoskeletal: No acute or destructive bony abnormalities. Severe bilateral hip osteoarthritis. Reconstructed images demonstrate no additional findings. IMPRESSION: 1. Postsurgical changes related to right hemicolectomy and diverting ileostomy right lower quadrant. 2. Small loculated fluid collection extending from the right infrahepatic region along the anterior margin of the liver capsule as above. Minimal peripheral enhancement is nonspecific, without other definitive features to suggest abscess. I believe the indwelling surgical drain traverses the inferior extent of this fluid collection. Continued radiographic follow-up is recommended. 3. Increased bibasilar consolidation, right greater than left, favor atelectasis over pneumonia. 4. Support devices as above. 5. Aortic Atherosclerosis (ICD10-I70.0). Interval stent placement within the SMA. 6. Cholelithiasis without cholecystitis. Electronically Signed   By: Sharlet Salina M.D.   On: 01/09/2023 15:11   EEG adult  Result  Date: 01/09/2023 Charlsie Quest, MD     01/09/2023  4:02 PM Patient Name: Scottie Seraphin MRN: [id number] Epilepsy Attending: Charlsie Quest Referring Physician/Provider: Caryl Pina, MD Date: 01/09/2023 Duration: 24.16 mins Patient history: 82yo M with ams getting eeg to evaluate for seizure Level of  alertness: Awake AEDs during EEG study: GBP Technical aspects: This EEG study was done with scalp electrodes positioned according to the 10-20 International system of electrode placement. Electrical activity was reviewed with band pass filter of 1-70Hz , sensitivity of 7 uV/mm, display speed of 19mm/sec with a 60Hz  notched filter applied as appropriate. EEG data were recorded continuously and digitally stored.  Video monitoring was available and reviewed as appropriate. Description: EEG showed continuous generalized 3 to 6 Hz theta-delta slowing. Hyperventilation and photic stimulation were not performed.   ABNORMALITY - Continuous slow, generalized IMPRESSION: This study is suggestive of moderate to severe diffuse encephalopathy. No seizures or epileptiform discharges were seen throughout the recording. Priyanka Annabelle Harman   CT ANGIO HEAD NECK W WO CM (CODE STROKE)  Result Date: 01/09/2023 CLINICAL DATA:  Assess intracranial arteries EXAM: CT ANGIOGRAPHY HEAD AND NECK WITH AND WITHOUT CONTRAST TECHNIQUE: Multidetector CT imaging of the head and neck was performed using the standard protocol during bolus administration of intravenous contrast. Multiplanar CT image reconstructions and MIPs were obtained to evaluate the vascular anatomy. Carotid stenosis measurements (when applicable) are obtained utilizing NASCET criteria, using the distal internal carotid diameter as the denominator. RADIATION DOSE REDUCTION: This exam was performed according to the departmental dose-optimization program which includes automated exposure control, adjustment of the mA and/or kV according to patient size and/or use of iterative  reconstruction technique. CONTRAST:  75mL OMNIPAQUE IOHEXOL 350 MG/ML SOLN COMPARISON:  Same day CT Head, CTA head/neck 01/05/23 FINDINGS: CT HEAD FINDINGS See same day CT head for intracranial findings. CTA NECK FINDINGS Aortic arch: Standard branching. Imaged portion shows no evidence of aneurysm or dissection. No significant stenosis of the major arch vessel origins. Right carotid system: No evidence of dissection, stenosis (50% or greater), or occlusion. Left carotid system: No evidence of dissection or occlusion.Moderate (70%) narrowing in the proximal left ICA. Severe focal stenosis at the left ICA as it enters the skull base. Vertebral arteries: Codominant. No evidence of dissection or occlusion.Severe narrowing at the origin of the right vertebral artery. Moderate narrowing in the V4 segment of the right vertebral artery Skeleton: Negative. Other neck: Negative. Upper chest: Calcified pleural plaques. Endotracheal and enteric tube in place. Review of the MIP images confirms the above findings CTA HEAD FINDINGS Anterior circulation: Moderate to severe atherosclerotic calcifications of the carotid siphons bilaterally. Bilateral supraclinoid ICAs and M1 segments are small in caliber. No proximal occlusion or focal stenosis. Posterior circulation: No significant stenosis, proximal occlusion, aneurysm, or vascular malformation. Venous sinuses: As permitted by contrast timing, patent. Anatomic variants: None Review of the MIP images confirms the above findings IMPRESSION: 1. No intracranial large vessel occlusion. 2. Severe focal stenosis at the left ICA as it enters the skull base. 3. Moderate (70%) narrowing in the proximal left ICA. 4. Severe narrowing at the origin of the right vertebral artery. 5. Bilateral supraclinoid ICAs and M1 segments are small in caliber. 6. Calcified pleural plaques, which can be seen in the setting of asbestos related pleural disease. Findings were discussed with Dr. Otelia Limes on  01/09/23 at 9:20 AM. Electronically Signed   By: Lorenza Cambridge M.D.   On: 01/09/2023 09:39   CT HEAD CODE STROKE WO CONTRAST  Result Date: 01/09/2023 CLINICAL DATA:  Code stroke.  Unresponsive EXAM: CT HEAD WITHOUT CONTRAST TECHNIQUE: Contiguous axial images were obtained from the base of the skull through the vertex without intravenous contrast. RADIATION DOSE REDUCTION: This exam was performed according to the departmental dose-optimization program which  includes automated exposure control, adjustment of the mA and/or kV according to patient size and/or use of iterative reconstruction technique. COMPARISON:  Brain MR 12/26/2022 FINDINGS: Brain: No hemorrhage. Hydrocephalus. No extra-axial fluid collection. No CT evidence of an acute cortical infarct. No mass effect. No mass lesion. There is chronic infarct in the left frontal lobe. Vascular: No hyperdense vessel or unexpected calcification. Skull: Normal. Negative for fracture or focal lesion. Sinuses/Orbits: No middle ear or mastoid effusion. Partially imaged endotracheal and enteric tubes in place orbits are unremarkable. Other: None. ASPECTS [facility] Stroke Program Early CT Score): 10 when accounting for chronic findings. IMPRESSION: 1. No hemorrhage or CT evidence of an acute cortical infarct. 2. Chronic infarct in the left frontal lobe. Findings were paged to Dr. Otelia Limes on 01/09/23 at 9:04 AM. Electronically Signed   By: Lorenza Cambridge M.D.   On: 01/09/2023 09:04   DG Chest Port 1 View  Addendum Date: 01/09/2023   ADDENDUM REPORT: 01/09/2023 05:00 ADDENDUM: Not mentioned above, there is no visible pneumothorax. Electronically Signed   By: Almira Bar M.D.   On: 01/09/2023 05:00   Result Date: 01/09/2023 CLINICAL DATA:  5626 with acute respiratory failure, [id number] with pneumothorax. EXAM: PORTABLE CHEST 1 VIEW COMPARISON:  Portable chest 01/05/2023 FINDINGS: 4:21 a.m. NGT enters the stomach. The side port is in the proximal stomach and the tip  would be in the body of stomach based on the position of the side port but is not in the field. ETT tip is 5.4 cm from the carina, right IJ line in the mid SVC at the azygous confluence. An electrical pad overlies the left chest base. There are extensive calcified pleural plaques. Minimal pleural effusions are unchanged. Bilateral infrahilar streaky atelectasis or infiltrates show interval improvement. The remaining lungs are clear of focal opacities. There is cardiomegaly with coronary artery stents and CABG changes. No evidence of CHF. Stable mediastinum with aortic tortuosity and calcific plaque. No new osseous abnormality. IMPRESSION: 1. Interval improvement in bilateral infrahilar streaky atelectasis or infiltrates. 2. Stable minimal pleural effusions and cardiomegaly. 3. Stable support apparatus. 4. Extensive calcified pleural plaques. 5. Aortic atherosclerosis. Electronically Signed: By: Almira Bar M.D. On: 01/09/2023 04:45   EEG adult  Result Date: 01/06/2023 Charlsie Quest, MD     01/06/2023  8:25 AM Patient Name: Roert Culligan MRN: [id number] Epilepsy Attending: Charlsie Quest Referring Physician/Provider: Caryl Pina, MD Date: 01/06/2023 Duration: 31.39 mins Patient history: 82yo M with ams getting eeg to evaluate for seizure Level of alertness:  comatose/ lethargic AEDs during EEG study: GBP Technical aspects: This EEG study was done with scalp electrodes positioned according to the 10-20 International system of electrode placement. Electrical activity was reviewed with band pass filter of 1-70Hz , sensitivity of 7 uV/mm, display speed of 22mm/sec with a 60Hz  notched filter applied as appropriate. EEG data were recorded continuously and digitally stored.  Video monitoring was available and reviewed as appropriate. Description: EEG showed continuous generalized 3 to 6 Hz theta-delta slowing. Hyperventilation and photic stimulation were not performed.   ABNORMALITY - Continuous slow,  generalized IMPRESSION: This study is suggestive of moderate to severe diffuse encephalopathy. No seizures or epileptiform discharges were seen throughout the recording. Charlsie Quest   MR BRAIN WO CONTRAST  Result Date: 01/06/2023 CLINICAL DATA:  Initial evaluation for neuro deficit, stroke. EXAM: MRI HEAD WITHOUT CONTRAST TECHNIQUE: Multiplanar, multiecho pulse sequences of the brain and surrounding structures were obtained without intravenous contrast. COMPARISON:  Prior studies from earlier  the same day. FINDINGS: Brain: Cerebral volume within normal limits. Mild chronic microvascular ischemic disease for age. Encephalomalacia and gliosis at the anterior left frontal lobe, likely a remote left MCA territory infarct. No evidence for acute or subacute ischemia. No acute intracranial hemorrhage. Few scattered punctate chronic micro hemorrhages noted, likely small vessel related. No mass lesion, midline shift or mass effect. Mild ventricular prominence related to atrophy without hydrocephalus. No extra-axial fluid collection. Pituitary gland and suprasellar region within normal limits. Vascular: Major intracranial vascular flow voids are maintained. Skull and upper cervical spine: Craniocervical junction within normal limits. Bone marrow signal intensity normal. No scalp soft tissue abnormality. Sinuses/Orbits: Globes and orbital soft tissues within normal limits. Paranasal sinuses are largely clear. Small to moderate bilateral mastoid effusions. Patient is intubated. Other: None. IMPRESSION: 1. No acute intracranial abnormality. 2. Remote left MCA territory infarct. 3. Underlying atrophy with mild chronic microvascular ischemic disease. Electronically Signed   By: Rise Mu M.D.   On: 01/06/2023 04:37   DG CHEST PORT 1 VIEW  Result Date: 01/06/2023 CLINICAL DATA:  41660 Hypoxemia 63016 EXAM: PORTABLE CHEST 1 VIEW COMPARISON:  Chest x-ray 01/03/2023, CT chest 12/02/2022 FINDINGS: Endotracheal  tube approximately 6 cm above the carina. Enteric tube coursing below the hemidiaphragm with side port overlying the expected region of the gastric lumen and tip collimated off view. Right internal jugular central venous catheter with tip overlying the expected region of the distal superior vena cava. The heart and mediastinal contours are within normal limits. No focal consolidation. No pulmonary edema. Trace bilateral pleural effusions. No pneumothorax. Redemonstration of bilateral pleural calcified plaques. No acute osseous abnormality. IMPRESSION: 1. Trace bilateral pleural effusions. 2. Chronic bilateral pleural calcified plaques. 3. Lines and tubes as above. Electronically Signed   By: Tish Frederickson M.D.   On: 01/06/2023 01:14   CT ANGIO HEAD NECK W WO CM (CODE STROKE)  Result Date: 01/05/2023 CLINICAL DATA:  Neuro deficit, acute, stroke suspected. Fix, dilated left pupil. Unresponsive. EXAM: CT ANGIOGRAPHY HEAD AND NECK WITH AND WITHOUT CONTRAST TECHNIQUE: Multidetector CT imaging of the head and neck was performed using the standard protocol during bolus administration of intravenous contrast. Multiplanar CT image reconstructions and MIPs were obtained to evaluate the vascular anatomy. Carotid stenosis measurements (when applicable) are obtained utilizing NASCET criteria, using the distal internal carotid diameter as the denominator. RADIATION DOSE REDUCTION: This exam was performed according to the departmental dose-optimization program which includes automated exposure control, adjustment of the mA and/or kV according to patient size and/or use of iterative reconstruction technique. CONTRAST:  75mL ISOVUE-370 IOPAMIDOL (ISOVUE-370) INJECTION 76% COMPARISON:  None Available. FINDINGS: CTA NECK FINDINGS Aortic arch: Standard 3 vessel aortic arch with moderate atherosclerosis. No significant stenosis of the arch vessel origins. Right carotid system: Patent with calcified and soft plaque at the carotid  bifurcation and in the carotid bulb. Less than 50% stenosis of the proximal ICA by NASCET criteria. Left carotid system: Patent with predominantly calcified plaque at the carotid bifurcation and in the carotid bulb resulting in 70% stenosis of the proximal ICA. Vertebral arteries: Patent with the left being mildly dominant. Atherosclerosis at both vertebral origins without evidence of significant stenosis. Skeleton: Mild-to-moderate cervical spondylosis and moderately advanced facet arthrosis. Other neck: No evidence of cervical lymphadenopathy or mass. Upper chest: Endotracheal tube terminating above the carina. Partially visualized enteric tube and right internal jugular central venous catheter. Large calcified pleural plaques. Review of the MIP images confirms the above findings CTA HEAD FINDINGS  Anterior circulation: The internal carotid arteries are patent from skull base to carotid termini with extensive siphon atherosclerosis resulting in moderate multifocal stenoses bilaterally. ACAs and MCAs are patent without evidence of a proximal branch occlusion or significant proximal stenosis. No aneurysm is identified. Posterior circulation: The intracranial vertebral arteries are patent to the basilar with atherosclerotic plaque resulting in severe right and mild left V4 stenoses. Patent PICA and SCA origins are visualized bilaterally. The basilar artery is patent with mild atherosclerotic irregularity but no significant stenosis. Posterior communicating arteries are diminutive or absent. The PCAs are patent with severe branch vessel stenoses. No aneurysm is identified. Venous sinuses: As permitted by contrast timing, patent. Anatomic variants: None. Review of the MIP images confirms the above findings These results were called by telephone at the time of interpretation on 01/05/2023 at 7:14 pm to Dr. Caryl Pina, who verbally acknowledged these results. IMPRESSION: 1. No large vessel occlusion. 2. Widespread  atherosclerosis in the head and neck including 70% proximal left ICA stenosis, moderate bilateral intracranial ICA stenoses, and severe right V4 stenosis. 3.  Aortic Atherosclerosis (ICD10-I70.0). Electronically Signed   By: Sebastian Ache M.D.   On: 01/05/2023 19:30   CT HEAD CODE STROKE WO CONTRAST  Result Date: 01/05/2023 CLINICAL DATA:  Code stroke. Neuro deficit, acute, stroke suspected. Fixed, dilated left pupil. EXAM: CT HEAD WITHOUT CONTRAST TECHNIQUE: Contiguous axial images were obtained from the base of the skull through the vertex without intravenous contrast. RADIATION DOSE REDUCTION: This exam was performed according to the departmental dose-optimization program which includes automated exposure control, adjustment of the mA and/or kV according to patient size and/or use of iterative reconstruction technique. COMPARISON:  None Available. FINDINGS: Brain: There is no evidence of an acute cortically based infarct, intracranial hemorrhage, mass, midline shift, or extra-axial fluid collection. Hypodensity in the left dorsal pons and middle cerebellar peduncle is favored to reflect artifact based on reformats. There is encephalomalacia anteriorly in the left frontal lobe with mild ex vacuo dilatation of the left frontal horn. There is mild cerebral atrophy. Vascular: Calcified atherosclerosis at the skull base. No hyperdense vessel. Skull: No acute fracture or suspicious osseous lesion. Sinuses/Orbits: Small mucous retention cysts in the maxillary sinuses. Trace left mastoid fluid. Unremarkable orbits. Other: None. ASPECTS ([place] Stroke Program Early CT Score) - Ganglionic level infarction (caudate, lentiform nuclei, internal capsule, insula, M1-M3 cortex): 7 - Supraganglionic infarction (M4-M6 cortex): 3 Total score (0-10 with 10 being normal): 10 These results were communicated to Dr. Otelia Limes at 6:56 pm on 01/05/2023 by text page via the [facility] messaging system. IMPRESSION: 1. No evidence of acute  intracranial abnormality.  ASPECTS of 10. 2. Left frontal lobe encephalomalacia. Electronically Signed   By: Sebastian Ache M.D.   On: 01/05/2023 18:59   DG Abd Portable 1V  Result Date: 01/04/2023 CLINICAL DATA:  Emesis. EXAM: PORTABLE ABDOMEN - 1 VIEW COMPARISON:  CT abdomen pelvis dated 12/14/2022. FINDINGS: The bowel gas pattern is normal. Air-fluid levels and free intraperitoneal air can not be excluded on the supine exam. A catheter overlies the pelvis. No radiopaque calculus in the expected location of the urinary tract. Significant degenerative changes are seen in the lumbar spine. There are severe degenerative changes of the right hip with deformity. Moderate degenerative changes are seen in the left hip. IMPRESSION: Nonobstructive bowel gas pattern. Electronically Signed   By: Romona Curls M.D.   On: 01/04/2023 13:45   ECHOCARDIOGRAM COMPLETE  Result Date: 01/03/2023    ECHOCARDIOGRAM REPORT  Patient Name:   AHMAD MAGANN Date of Exam: 01/03/2023 Medical Rec #:  [id number]        Height:       69.0 in Accession #:    [id number]       Weight:       179.9 lb Date of Birth:  Jun 12, 1940         BSA:          1.975 m Patient Age:    82 years         BP:           125/38 mmHg Patient Gender: M                HR:           70 bpm. Exam Location:  Inpatient Procedure: 2D Echo, Cardiac Doppler and Color Doppler Indications:    Shock  History:        Patient has prior history of Echocardiogram examinations, most                 recent 05/13/2021. CAD, Prior CABG, Signs/Symptoms:Dyspnea; Risk                 Factors:Hypertension, Dyslipidemia, Sleep Apnea, Former Smoker                 and ESRD.  Sonographer:    Raeford Razor RDCS Referring Phys: [id number] BRADLEY L ICARD IMPRESSIONS  1. Left ventricular ejection fraction, by estimation, is 60 to 65%. The left ventricle has normal function. The left ventricle has no regional wall motion abnormalities. Left ventricular diastolic function could not be evaluated.  There is the interventricular septum is flattened in systole and diastole, consistent with right ventricular pressure and volume overload.  2. Right ventricular systolic function is moderately reduced. The right ventricular size is moderately enlarged.  3. Right atrial size was dilated.  4. The mitral valve is degenerative. Trivial mitral valve regurgitation. No evidence of mitral stenosis.  5. The aortic valve was not well visualized. Aortic valve regurgitation is trivial. No aortic stenosis is present. FINDINGS  Left Ventricle: Left ventricular ejection fraction, by estimation, is 60 to 65%. The left ventricle has normal function. The left ventricle has no regional wall motion abnormalities. The left ventricular internal cavity size was normal in size. There is  no left ventricular hypertrophy. The interventricular septum is flattened in systole and diastole, consistent with right ventricular pressure and volume overload and abnormal (paradoxical) septal motion consistent with post-operative status. Left ventricular diastolic function could not be evaluated. Right Ventricle: The right ventricular size is moderately enlarged. Right ventricular wall thickness was not well visualized. Right ventricular systolic function is moderately reduced. Left Atrium: Left atrial size was normal in size. Right Atrium: Right atrial size was dilated. Pericardium: There is no evidence of pericardial effusion. Mitral Valve: The mitral valve is degenerative in appearance. Trivial mitral valve regurgitation. No evidence of mitral valve stenosis. Tricuspid Valve: The tricuspid valve is grossly normal. Tricuspid valve regurgitation is mild . No evidence of tricuspid stenosis. Aortic Valve: The aortic valve was not well visualized. Aortic valve regurgitation is trivial. No aortic stenosis is present. Aortic valve mean gradient measures 10.7 mmHg. Aortic valve peak gradient measures 21.5 mmHg. Pulmonic Valve: The pulmonic valve was not  well visualized. Pulmonic valve regurgitation is not visualized. No evidence of pulmonic stenosis. Aorta: The aortic root and ascending aorta are structurally normal, with no evidence of dilitation. Venous: IVC assessment for  right atrial pressure unable to be performed due to mechanical ventilation. IAS/Shunts: The atrial septum is grossly normal.  LEFT VENTRICLE PLAX 2D LVIDd:         5.20 cm LVIDs:         3.70 cm LV PW:         1.10 cm LV IVS:        0.80 cm LVOT diam:     2.10 cm LVOT Area:     3.46 cm  LV Volumes (MOD) LV vol d, MOD A2C: 96.3 ml LV vol d, MOD A4C: 140.0 ml LV vol s, MOD A2C: 38.1 ml LV vol s, MOD A4C: 55.0 ml LV SV MOD A2C:     58.2 ml LV SV MOD A4C:     140.0 ml LV SV MOD BP:      70.5 ml RIGHT VENTRICLE            IVC RV Basal diam:  4.40 cm    IVC diam: 1.90 cm RV Mid diam:    3.80 cm RV S prime:     6.74 cm/s TAPSE (M-mode): 0.8 cm LEFT ATRIUM             Index        RIGHT ATRIUM           Index LA diam:        4.00 cm 2.03 cm/m   RA Area:     26.20 cm LA Vol (A2C):   87.7 ml 44.40 ml/m  RA Volume:   83.60 ml  42.33 ml/m LA Vol (A4C):   40.5 ml 20.51 ml/m LA Biplane Vol: 59.3 ml 30.02 ml/m  AORTIC VALVE AV Area (Vmax): 1.73 cm AV Vmax:        231.67 cm/s AV Vmean:       150.000 cm/s AV VTI:         0.374 m AV Peak Grad:   21.5 mmHg AV Mean Grad:   10.7 mmHg LVOT Vmax:      115.50 cm/s  AORTA Ao Root diam: 3.10 cm Ao Asc diam:  3.20 cm MITRAL VALVE                TRICUSPID VALVE MV Area (PHT): 3.60 cm     TR Peak grad:   23.4 mmHg MV Decel Time: 211 msec     TR Vmax:        242.00 cm/s MV E velocity: 153.00 cm/s MV A velocity: 139.00 cm/s  SHUNTS MV E/A ratio:  1.10         Systemic Diam: 2.10 cm Sunit Tolia Electronically signed by Tessa Lerner Signature Date/Time: 01/03/2023/1:45:58 PM    Final    DG CHEST PORT 1 VIEW  Result Date: 01/03/2023 CLINICAL DATA:  Ventilator dependence. EXAM: PORTABLE CHEST 1 VIEW COMPARISON:  12/29/2022 FINDINGS: Endotracheal tube tip is  approximately 4 cm above the base of the carina. The NG tube passes into the stomach although the distal tip position is not included on the film. Prominent gastric bubble incompletely visualized despite the presence of a NG tube. Right IJ central line tip overlies the expected location of the innominate vein confluence. The cardio pericardial silhouette is enlarged. Interstitial markings are diffusely coarsened with chronic features. Bilateral calcified pleural plaques evident. Basilar atelectasis and small effusions again noted. IMPRESSION: 1. Stable.  Basilar atelectasis with small effusions. Electronically Signed   By: Kennith Center M.D.   On: 01/03/2023 10:16   DG  Chest Port 1 View  Result Date: 01/14/2023 CLINICAL DATA:  Respiratory failure EXAM: PORTABLE CHEST 1 VIEW COMPARISON:  Chest x-ray dated December 26, 2022 FINDINGS: Stable position of ETT, enteric tube, and right IJ line. Cardiac and mediastinal contours are unchanged post median sternotomy. Extensive calcified pleural plaques. Small bilateral pleural effusions and atelectasis unchanged. Evidence of pneumothorax. IMPRESSION: 1. Small bilateral pleural effusions and atelectasis unchanged. 2. Stable support devices. 3. Extensive calcified pleural plaques. Electronically Signed   By: Allegra Lai M.D.   On: 01/23/2023 09:15   DG C-Arm 1-60 Min  Result Date: 12/26/2022 CLINICAL DATA:  Mesenteric stent EXAM: DG C-ARM 1-60 MIN COMPARISON:  CTA 12/16/2022 FINDINGS: A series of subtracted C-arm runs are submitted, demonstrating catheterization of the SMA and deployment of an ostial stent without evident complication. IMPRESSION: SMA stent placement. Electronically Signed   By: Corlis Leak M.D.   On: 12/26/2022 16:58   DG Chest Port 1 View  Result Date: 12/26/2022 CLINICAL DATA:  Intubation. EXAM: PORTABLE CHEST 1 VIEW COMPARISON:  Same day. FINDINGS: Endotracheal and nasogastric tubes are in grossly good position. Right internal jugular  catheter is unchanged. Stable calcified pleural plaques are noted bilaterally. Stable bibasilar subsegmental atelectasis. IMPRESSION: Stable support apparatus. Stable bibasilar subsegmental atelectasis. Stable calcified pleural plaques. Electronically Signed   By: Lupita Raider M.D.   On: 12/26/2022 11:00   DG CHEST PORT 1 VIEW  Result Date: 12/26/2022 CLINICAL DATA:  Central line EXAM: PORTABLE CHEST 1 VIEW COMPARISON:  12/18/2022 FINDINGS: Sternotomy. Interval intubation, tip of the endotracheal tube is about 5 cm superior to carina. Esophageal tube tip below the diaphragm but incompletely visualized. Right IJ central venous catheter tip over the SVC. No pneumothorax. Extensive calcified pleural plaques bilaterally. Normal cardiac size. Possible small pleural effusions. Hazy atelectasis at the bases. IMPRESSION: 1. Interval intubation, tip of the endotracheal tube about 5 cm superior to carina. Right IJ central venous catheter tip over the SVC. No pneumothorax. 2. Extensive calcified pleural plaques bilaterally. Possible small pleural effusions and hazy atelectasis at the bases. Electronically Signed   By: Jasmine Pang M.D.   On: 12/26/2022 03:54   CT Angio Abd/Pel W and/or Wo Contrast  Result Date: 12/14/2022 CLINICAL DATA:  Chest and abdomen pain. Acute mesenteric ischemia. Pulmonary embolus suspected with high probability. EXAM: CT ANGIOGRAPHY CHEST, ABDOMEN AND PELVIS TECHNIQUE: Multidetector CT imaging through the chest, abdomen and pelvis was performed using the standard protocol during bolus administration of intravenous contrast. Multiplanar reconstructed images and MIPs were obtained and reviewed to evaluate the vascular anatomy. RADIATION DOSE REDUCTION: This exam was performed according to the departmental dose-optimization program which includes automated exposure control, adjustment of the mA and/or kV according to patient size and/or use of iterative reconstruction technique. CONTRAST:   80mL OMNIPAQUE IOHEXOL 350 MG/ML SOLN COMPARISON:  CT chest 05/29/2022.  Abdominal radiograph 05/16/2021 FINDINGS: CTA CHEST FINDINGS Cardiovascular: Technically adequate study with good opacification of the central and segmental pulmonary arteries. Mild motion artifact. No focal filling defects are identified. No evidence of significant pulmonary embolus. Cardiac enlargement. Reflux of contrast material into the IVC may indicate right heart failure. No pericardial effusions. Postoperative changes consistent with coronary bypass. Normal caliber thoracic aorta. No aortic dissection. Calcification of the aorta. Mediastinum/Nodes: Thyroid gland is unremarkable. Esophagus is decompressed. No significant lymphadenopathy. Lungs/Pleura: Multiple large calcified pleural plaques bilaterally with areas of pleural thickening. Trace right and small left pleural effusions with mild basilar atelectasis. Musculoskeletal: Degenerative changes in the spine  and shoulders. Sternotomy wires. No acute bony abnormalities. Review of the MIP images confirms the above findings. CTA ABDOMEN AND PELVIS FINDINGS VASCULAR Aorta: Diffuse calcification of the aorta. No aneurysm, dissection, or significant stenosis. 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 SMA: Focal stenosis of the origin of the superior mesenteric artery, likely representing about 70% diameter reduction. Renals: Single renal arteries bilaterally are diffusely diminutive with symmetrical but delayed nephrograms bilaterally. This likely represents moderate to significant renal artery stenosis. IMA: Inferior mesenteric artery is prominent without evidence of significant stenosis or dissection. Inflow: Patent without evidence of aneurysm, dissection, vasculitis or significant stenosis. Diffuse calcification. Veins: No obvious venous abnormality within the limitations of this arterial  phase study. Review of the MIP images confirms the above findings. NON-VASCULAR Hepatobiliary: Cholelithiasis without inflammatory change. No focal liver lesions. No bile duct dilatation. Pancreas: Unremarkable. No pancreatic ductal dilatation or surrounding inflammatory changes. Spleen: Normal in size without focal abnormality. Adrenals/Urinary Tract: No adrenal gland nodules. Bilateral renal parenchymal atrophy. No hydronephrosis or hydroureter. Bladder is decompressed. Stomach/Bowel: Stomach, small bowel, and colon are not abnormally distended. There is pneumatosis of the cecum with portal venous gas in the right lower quadrant likely representing colonic ischemia. Sigmoid colonic diverticulosis without evidence of acute diverticulitis. Appendix is not identified. Lymphatic: No significant lymphadenopathy. Reproductive: Prostate is unremarkable. Other: Small amount of free fluid in the upper abdomen around the liver, likely ascites. No free air. Abdominal wall musculature demonstrates diffuse fatty atrophy but appears intact. Musculoskeletal: Degenerative changes in the spine and hips. Postoperative lower lumbar laminectomies. No acute bony abnormalities. Review of the MIP images confirms the above findings. IMPRESSION: 1. No evidence of significant pulmonary embolus. 2. Multiple calcified pleural plaques with areas of pleural thickening. This may indicate prior asbestos exposure or infection. 3. Small bilateral pleural effusions with basilar atelectasis. 4. Diffuse aortic atherosclerosis. 5. 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 6. 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 7. Cecal pneumatosis with portal venous gas in the right lower quadrant likely indicates cecal ischemia. 8. Bilateral renal artery stenosis with delayed nephrograms and parenchymal atrophy. 9.  Cholelithiasis without evidence of acute cholecystitis. 10. Small amount of upper abdominal ascites.  No free air. Critical Value/emergent results were called by telephone at the time of interpretation on 12/10/2022 at 8:31 pm to provider [facility] , who verbally acknowledged these results. Electronically Signed   By: Burman Nieves M.D.   On: 11/26/2022 20:37   CT Angio Chest PE W and/or Wo Contrast  Result Date: 11/26/2022 CLINICAL DATA:  Chest and abdomen pain. Acute mesenteric ischemia. Pulmonary embolus suspected with high probability. EXAM: CT ANGIOGRAPHY CHEST, ABDOMEN AND PELVIS TECHNIQUE: Multidetector CT imaging through the chest, abdomen and pelvis was performed using the standard protocol during bolus administration of intravenous contrast. Multiplanar reconstructed images and MIPs were obtained and reviewed to evaluate the vascular anatomy. RADIATION DOSE REDUCTION: This exam was performed according to the departmental dose-optimization program which includes automated exposure control, adjustment of the mA and/or kV according to patient size and/or use of iterative reconstruction technique. CONTRAST:  80mL OMNIPAQUE IOHEXOL 350 MG/ML SOLN COMPARISON:  CT chest 05/29/2022.  Abdominal radiograph 05/16/2021 FINDINGS: CTA CHEST FINDINGS Cardiovascular: Technically adequate study with good opacification of the central and segmental pulmonary arteries. Mild motion artifact. No focal  filling defects are identified. No evidence of significant pulmonary embolus. Cardiac enlargement. Reflux of contrast material into the IVC may indicate right heart failure. No pericardial effusions. Postoperative changes consistent with coronary bypass. Normal caliber thoracic aorta. No aortic dissection. Calcification of the aorta. Mediastinum/Nodes: Thyroid gland is unremarkable. Esophagus is decompressed. No significant lymphadenopathy. Lungs/Pleura: Multiple large calcified pleural plaques bilaterally with areas of  pleural thickening. Trace right and small left pleural effusions with mild basilar atelectasis. Musculoskeletal: Degenerative changes in the spine and shoulders. Sternotomy wires. No acute bony abnormalities. Review of the MIP images confirms the above findings. CTA ABDOMEN AND PELVIS FINDINGS VASCULAR Aorta: Diffuse calcification of the aorta. No aneurysm, dissection, or significant stenosis. 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 SMA: Focal stenosis of the origin of the superior mesenteric artery, likely representing about 70% diameter reduction. Renals: Single renal arteries bilaterally are diffusely diminutive with symmetrical but delayed nephrograms bilaterally. This likely represents moderate to significant renal artery stenosis. IMA: Inferior mesenteric artery is prominent without evidence of significant stenosis or dissection. Inflow: Patent without evidence of aneurysm, dissection, vasculitis or significant stenosis. Diffuse calcification. Veins: No obvious venous abnormality within the limitations of this arterial phase study. Review of the MIP images confirms the above findings. NON-VASCULAR Hepatobiliary: Cholelithiasis without inflammatory change. No focal liver lesions. No bile duct dilatation. Pancreas: Unremarkable. No pancreatic ductal dilatation or surrounding inflammatory changes. Spleen: Normal in size without focal abnormality. Adrenals/Urinary Tract: No adrenal gland nodules. Bilateral renal parenchymal atrophy. No hydronephrosis or hydroureter. Bladder is decompressed. Stomach/Bowel: Stomach, small bowel, and colon are not abnormally distended. There is pneumatosis of the cecum with portal venous gas in the right lower quadrant likely representing colonic ischemia. Sigmoid colonic diverticulosis without evidence of acute diverticulitis. Appendix is not identified. Lymphatic: No significant  lymphadenopathy. Reproductive: Prostate is unremarkable. Other: Small amount of free fluid in the upper abdomen around the liver, likely ascites. No free air. Abdominal wall musculature demonstrates diffuse fatty atrophy but appears intact. Musculoskeletal: Degenerative changes in the spine and hips. Postoperative lower lumbar laminectomies. No acute bony abnormalities. Review of the MIP images confirms the above findings. IMPRESSION: 1. No evidence of significant pulmonary embolus. 2. Multiple calcified pleural plaques with areas of pleural thickening. This may indicate prior asbestos exposure or infection. 3. Small bilateral pleural effusions with basilar atelectasis. 4. Diffuse aortic atherosclerosis. 5. 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 6. 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 7. Cecal pneumatosis with portal venous gas in the right lower quadrant likely indicates cecal ischemia. 8. Bilateral renal artery stenosis with delayed nephrograms and parenchymal atrophy. 9. Cholelithiasis without evidence of acute cholecystitis. 10. Small amount of upper abdominal ascites.  No free air. Critical Value/emergent results were called by telephone at the time of interpretation on 11/25/2022 at 8:31 pm to provider [facility] , who verbally acknowledged these results. Electronically Signed   By: Burman Nieves M.D.   On: 12/18/2022 20:37   DG Chest Portable 1 View  Result Date: 12/02/2022 CLINICAL DATA:  Shortness of breath. EXAM: PORTABLE CHEST 1 VIEW COMPARISON:  May 13, 2021 FINDINGS: Multiple sternal wires and vascular clips are seen. The cardiac silhouette is mildly enlarged and unchanged in size. Mild, diffuse, chronic appearing increased lung markings are seen with mild areas of scarring and/or atelectasis is  noted within the bilateral lung bases. There is a small,  stable left pleural effusion. Extensive areas of pleural based calcification are again seen, bilaterally. No pneumothorax is identified. Multilevel degenerative changes seen throughout the thoracic spine. IMPRESSION: 1. Evidence of prior median sternotomy/CABG. 2. Chronic appearing increased lung markings with mild bibasilar scarring and/or atelectasis. 3. Small, stable left pleural effusion. 4. Extensive pleural based calcifications which may represent sequelae associated with asbestos exposure. Electronically Signed   By: Aram Candela M.D.   On: 12/23/2022 17:44    Microbiology No results found for this or any previous visit (from the past 240 hour(s)).  Lab Basic Metabolic Panel: Recent Labs  Lab 01/06/23 0440 01/06/23 1600 01/07/23 0401 01/07/23 1040 01/08/23 0518 01/08/23 1519 01/09/23 0320 01/09/23 0344 01/09/23 0806 01/09/23 1615 01/12/2023 0314 01/22/2023 0315  NA 128*   < > 129*   < > 130* 131* 127* 135 133* 133*  --  132*  K 4.1   < > 3.7   < > 4.1 4.2 5.0 4.9 4.7 4.0  --  4.0  CL 99   < > 99   < > 100 100 98  --   --  102  --  104  CO2 18*   < > 20*   < > 21* 23 14*  --   --  19*  --  19*  GLUCOSE 145*   < > 140*   < > 149* 150* 162*  --   --  211*  --  160*  BUN 111*   < > 86*   < > 69* 63* 67*  --   --  70*  --  69*  CREATININE 2.86*   < > 2.34*   < > 1.36* 1.30* 1.34*  --   --  1.74*  --  1.06  CALCIUM 9.1   < > 8.7*   < > 9.0 9.1 8.9  --   --  8.4*  --  8.9  MG 2.2  --  2.3  --  2.4  --  2.6*  --   --   --  2.4  --   PHOS 2.9   < > 2.3*   < > 2.3* 5.3* 4.4  --   --  3.1  --  2.7   < > = values in this interval not displayed.   Liver Function Tests: Recent Labs  Lab 01/06/23 0440 01/06/23 1600 01/07/23 0401 01/07/23 1614 01/08/23 0518 01/08/23 1519 01/09/23 0320 01/09/23 1615 12/31/2022 0314 01/05/2023 0315  AST 49*  --  205*  --  273*  --  311*  --  465*  --   ALT 32  --  106*  --  174*  --  214*  --  263*  --   ALKPHOS 164*  --  257*  --  330*  --  348*   --  322*  --   BILITOT 2.3*  --  2.8*  --  3.9*  --  4.5*  --  5.2*  --   PROT 5.6*  --  5.6*  --  6.3*  --  6.7  --  5.8*  --   ALBUMIN 2.5*   < > 2.3*  2.3*   < > 2.2*  2.2* 2.3* 2.1*  2.1* 2.1* 1.9* 1.9*   < > = values in this interval not displayed.   No results for input(s): "LIPASE", "AMYLASE" in the last 168 hours. No results for input(s): "AMMONIA" in the  last 168 hours. CBC: Recent Labs  Lab 01/06/23 0440 01/07/23 0401 01/07/23 1040 01/08/23 0518 01/08/23 2006 01/09/23 0320 01/09/23 0344 01/09/23 0806 01/15/2023 0314 12/31/2022 0315  WBC 11.1* 9.3  --  15.4*  --  15.8*  --   --  16.3*  --   HGB 8.3* 7.8*   < > 8.5* 8.8* 8.6* 8.5* 9.2* 7.9*  --   HCT 24.8* 23.9*   < > 26.1* 27.7* 27.1* 25.0* 27.0* 23.8*  --   MCV 93.9 93.4  --  96.0  --  98.2  --   --  95.6  --   PLT 156 152  --  166  --  187  --   --  165 167   < > = values in this interval not displayed.   Cardiac Enzymes: No results for input(s): "CKTOTAL", "CKMB", "CKMBINDEX", "TROPONINI" in the last 168 hours. Sepsis Labs: Recent Labs  Lab 01/07/23 0401 01/08/23 0518 01/09/23 0320 01/14/2023 0314  WBC 9.3 15.4* 15.8* 16.3*    Procedures/Operations     Rosi Secrist 01/11/2023, 10:08 AM

## 2023-01-25 NOTE — Progress Notes (Signed)
Progress note   On-going GOC eval at bedside. Pt able to spont ventilate on PSV 15 but RR 30s. Still not responsive Family is requesting we transition to comfort care  Impression Comfort care status DNR Refractory respiratory failure  Advanced emphysema  ESRD Circulatory shock On-going acute encephalopathy   Plan Transition to morphine gtt Extubate Full comfort care

## 2023-01-25 NOTE — Progress Notes (Signed)
Patient ID: Peter Oneill, male   DOB: 1940-12-10, 82 y.o.   MRN: [id number] [facility] Surgery Progress Note  14 Days Post-Op  Subjective: No further bleeding from his abdominal wound.  TFs are currently being held.  Ileostomy working.     Objective: Vital signs in last 24 hours: Temp:  [98 F (36.7 C)-102.1 F (38.9 C)] 98.1 F (36.7 C) (11/16 0801) Pulse Rate:  [71-100] 77 (11/16 0730) Resp:  [19-42] 32 (11/16 0730) BP: (121-151)/(37-47) 125/40 (11/16 0400) SpO2:  [99 %-100 %] 100 % (11/16 0730) Arterial Line BP: (102-150)/(32-98) 108/32 (11/16 0730) FiO2 (%):  [40 %-50 %] 40 % (11/16 0801) Weight:  [79.3 kg] 79.3 kg (11/16 0500) Last BM Date : 12/29/2022  Intake/Output from previous day: 11/15 0701 - 11/16 0700 In: 4122.8 [I.V.:3413.3; NG/GT:659.5; IV Piggyback:50] Out: 3487.9 [Drains:20; Stool:375] Intake/Output this shift: Total I/O In: 97.8 [I.V.:97.8] Out: 100   PE: General appearance: critically ill appearing on vent GI: soft, cdi dressing to midline wound, no bleeding, ileostomy mucosa difficult to see/ appears to be sloughing some and a bit retracted.  Some melena noted in ileostomy but expected given post oropharyngeal bleeding, drain with scant serosang output             Ostomy - 375 cc             JP - 20 SS   Lab Results:  Recent Labs    01/09/23 0320 01/09/23 0344 01/09/23 0806 01/04/2023 0314 01/11/2023 0315  WBC 15.8*  --   --  16.3*  --   HGB 8.6*   < > 9.2* 7.9*  --   HCT 27.1*   < > 27.0* 23.8*  --   PLT 187  --   --  165 167   < > = values in this interval not displayed.   BMET Recent Labs    01/09/23 1615 01/12/2023 0315  NA 133* 132*  K 4.0 4.0  CL 102 104  CO2 19* 19*  GLUCOSE 211* 160*  BUN 70* 69*  CREATININE 1.74* 1.06  CALCIUM 8.4* 8.9   PT/INR Recent Labs    01/21/2023 0251 01/17/2023 0315  LABPROT 20.1* 20.2*  INR 1.7* 1.7*   CMP     Component Value Date/Time   NA 132 (L) 01/18/2023 0315   K 4.0 12/30/2022 0315    CL 104 01/06/2023 0315   CO2 19 (L) 01/05/2023 0315   GLUCOSE 160 (H) 01/14/2023 0315   BUN 69 (H) 12/29/2022 0315   CREATININE 1.06 12/28/2022 0315   CALCIUM 8.9 12/26/2022 0315   CALCIUM 8.2 (L) 05/15/2021 0750   PROT 5.8 (L) 12/31/2022 0314   PROT 6.6 03/17/2016 1226   ALBUMIN 1.9 (L) 01/16/2023 0315   ALBUMIN 4.0 03/17/2016 1226   AST 465 (H) 01/11/2023 0314   ALT 263 (H) 01/20/2023 0314   ALKPHOS 322 (H) 01/15/2023 0314   BILITOT 5.2 (H) 01/22/2023 0314   BILITOT 0.4 03/17/2016 1226   GFRNONAA >60 01/21/2023 0315   Lipase     Component Value Date/Time   LIPASE 41 12/01/2022 1603       Studies/Results: DG Chest Port 1 View  Result Date: 01/06/2023 CLINICAL DATA:  Hemoptysis.  Post vascular surgery EXAM: PORTABLE CHEST 1 VIEW COMPARISON:  Yesterday FINDINGS: Endotracheal tube with tip just below the clavicular heads. Enteric tube tip reaches the stomach with side port near the GE junction. Right IJ line with tip at the upper SVC. Bulky calcified pleural plaques  bilaterally with small left more than right pleural effusion. Superimposed hazy airspace density at the bases. No pneumothorax. IMPRESSION: 1. Unremarkable hardware. 2. Right lower lobe atelectasis or pneumonia as seen on abdominal CT from yesterday. Left base opacity from scarring and small pleural effusion. 3. Asbestos related pleural disease. Electronically Signed   By: Tiburcio Pea M.D.   On: 01/18/2023 04:38   CT ABDOMEN PELVIS W CONTRAST  Result Date: 01/09/2023 CLINICAL DATA:  Intra-abdominal abscess, history of ileostomy and laparotomy 01/04/2023 EXAM: CT ABDOMEN AND PELVIS WITH CONTRAST TECHNIQUE: Multidetector CT imaging of the abdomen and pelvis was performed using the standard protocol following bolus administration of intravenous contrast. RADIATION DOSE REDUCTION: This exam was performed according to the departmental dose-optimization program which includes automated exposure control, adjustment of the  mA and/or kV according to patient size and/or use of iterative reconstruction technique. CONTRAST:  75mL OMNIPAQUE IOHEXOL 350 MG/ML SOLN COMPARISON:  12/08/2022, 01/04/2023 FINDINGS: Lower chest: Continue bilateral calcified pleural plaques. Stable loculated left pleural effusion. Increased dependent bilateral lower lobe consolidation may reflect atelectasis or airspace disease. Stable cardiomegaly without pericardial effusion. Hepatobiliary: Liver is grossly unremarkable without focal abnormality. Calcified gallstones without cholecystitis. No biliary duct dilation. Pancreas: Unremarkable. No pancreatic ductal dilatation or surrounding inflammatory changes. Spleen: Normal in size without focal abnormality. Adrenals/Urinary Tract: Stable bilateral renal cortical atrophy. No urinary tract calculi or obstructive uropathy. The adrenals are unremarkable. Bladder is decompressed, limiting its evaluation. Stomach/Bowel: Postsurgical changes from right hemicolectomy and diverting ileostomy right lower quadrant. No bowel obstruction or ileus. Enteric catheter tip within the gastric lumen. No bowel wall thickening or inflammatory change. Scattered diverticulosis throughout the distal colon without diverticulitis. Vascular/Lymphatic: Stable diffuse atherosclerosis of the aorta and its branches. Right common femoral arterial catheter tip extends into the right external iliac artery. There is a left common femoral venous catheter, tip extending to the left common iliac vein. Interval placement of a stent within the superior mesenteric artery origin. No pathologic adenopathy within the abdomen or pelvis. Reproductive: Prostate is unremarkable. Other: A surgical drain is seen entering the abdomen in the right lower quadrant, tip extending to the right infra hepatic region. There is a small amount of loculated fluid along the distal margin of the surgical drain, measuring up to 1.3 x 4.1 cm reference image 42/3. This fluid  extends along the anterior margin of the liver capsule, measuring up to 1 cm maximal thickness. This fluid demonstrates minimal peripheral enhancement without other features to suggest definitive abscess formation. The surgical drain appears to traverse this fluid. Continued follow-up recommended. No free intraperitoneal gas. Postsurgical changes from midline laparotomy with surgical packing at the operative site. Musculoskeletal: No acute or destructive bony abnormalities. Severe bilateral hip osteoarthritis. Reconstructed images demonstrate no additional findings. IMPRESSION: 1. Postsurgical changes related to right hemicolectomy and diverting ileostomy right lower quadrant. 2. Small loculated fluid collection extending from the right infrahepatic region along the anterior margin of the liver capsule as above. Minimal peripheral enhancement is nonspecific, without other definitive features to suggest abscess. I believe the indwelling surgical drain traverses the inferior extent of this fluid collection. Continued radiographic follow-up is recommended. 3. Increased bibasilar consolidation, right greater than left, favor atelectasis over pneumonia. 4. Support devices as above. 5. Aortic Atherosclerosis (ICD10-I70.0). Interval stent placement within the SMA. 6. Cholelithiasis without cholecystitis. Electronically Signed   By: Sharlet Salina M.D.   On: 01/09/2023 15:11   EEG adult  Result Date: 01/09/2023 Charlsie Quest, MD  01/09/2023  4:02 PM Patient Name: Peter Oneill MRN: [id number] Epilepsy Attending: Charlsie Quest Referring Physician/Provider: Caryl Pina, MD Date: 01/09/2023 Duration: 24.16 mins Patient history: 82yo M with ams getting eeg to evaluate for seizure Level of alertness: Awake AEDs during EEG study: GBP Technical aspects: This EEG study was done with scalp electrodes positioned according to the 10-20 International system of electrode placement. Electrical activity was reviewed  with band pass filter of 1-70Hz , sensitivity of 7 uV/mm, display speed of 24mm/sec with a 60Hz  notched filter applied as appropriate. EEG data were recorded continuously and digitally stored.  Video monitoring was available and reviewed as appropriate. Description: EEG showed continuous generalized 3 to 6 Hz theta-delta slowing. Hyperventilation and photic stimulation were not performed.   ABNORMALITY - Continuous slow, generalized IMPRESSION: This study is suggestive of moderate to severe diffuse encephalopathy. No seizures or epileptiform discharges were seen throughout the recording. Priyanka Annabelle Harman   CT ANGIO HEAD NECK W WO CM (CODE STROKE)  Result Date: 01/09/2023 CLINICAL DATA:  Assess intracranial arteries EXAM: CT ANGIOGRAPHY HEAD AND NECK WITH AND WITHOUT CONTRAST TECHNIQUE: Multidetector CT imaging of the head and neck was performed using the standard protocol during bolus administration of intravenous contrast. Multiplanar CT image reconstructions and MIPs were obtained to evaluate the vascular anatomy. Carotid stenosis measurements (when applicable) are obtained utilizing NASCET criteria, using the distal internal carotid diameter as the denominator. RADIATION DOSE REDUCTION: This exam was performed according to the departmental dose-optimization program which includes automated exposure control, adjustment of the mA and/or kV according to patient size and/or use of iterative reconstruction technique. CONTRAST:  75mL OMNIPAQUE IOHEXOL 350 MG/ML SOLN COMPARISON:  Same day CT Head, CTA head/neck 01/05/23 FINDINGS: CT HEAD FINDINGS See same day CT head for intracranial findings. CTA NECK FINDINGS Aortic arch: Standard branching. Imaged portion shows no evidence of aneurysm or dissection. No significant stenosis of the major arch vessel origins. Right carotid system: No evidence of dissection, stenosis (50% or greater), or occlusion. Left carotid system: No evidence of dissection or occlusion.Moderate  (70%) narrowing in the proximal left ICA. Severe focal stenosis at the left ICA as it enters the skull base. Vertebral arteries: Codominant. No evidence of dissection or occlusion.Severe narrowing at the origin of the right vertebral artery. Moderate narrowing in the V4 segment of the right vertebral artery Skeleton: Negative. Other neck: Negative. Upper chest: Calcified pleural plaques. Endotracheal and enteric tube in place. Review of the MIP images confirms the above findings CTA HEAD FINDINGS Anterior circulation: Moderate to severe atherosclerotic calcifications of the carotid siphons bilaterally. Bilateral supraclinoid ICAs and M1 segments are small in caliber. No proximal occlusion or focal stenosis. Posterior circulation: No significant stenosis, proximal occlusion, aneurysm, or vascular malformation. Venous sinuses: As permitted by contrast timing, patent. Anatomic variants: None Review of the MIP images confirms the above findings IMPRESSION: 1. No intracranial large vessel occlusion. 2. Severe focal stenosis at the left ICA as it enters the skull base. 3. Moderate (70%) narrowing in the proximal left ICA. 4. Severe narrowing at the origin of the right vertebral artery. 5. Bilateral supraclinoid ICAs and M1 segments are small in caliber. 6. Calcified pleural plaques, which can be seen in the setting of asbestos related pleural disease. Findings were discussed with Dr. Otelia Limes on 01/09/23 at 9:20 AM. Electronically Signed   By: Lorenza Cambridge M.D.   On: 01/09/2023 09:39   CT HEAD CODE STROKE WO CONTRAST  Result Date: 01/09/2023 CLINICAL DATA:  Code stroke.  Unresponsive EXAM: CT HEAD WITHOUT CONTRAST TECHNIQUE: Contiguous axial images were obtained from the base of the skull through the vertex without intravenous contrast. RADIATION DOSE REDUCTION: This exam was performed according to the departmental dose-optimization program which includes automated exposure control, adjustment of the mA and/or kV  according to patient size and/or use of iterative reconstruction technique. COMPARISON:  Brain MR 12/26/2022 FINDINGS: Brain: No hemorrhage. Hydrocephalus. No extra-axial fluid collection. No CT evidence of an acute cortical infarct. No mass effect. No mass lesion. There is chronic infarct in the left frontal lobe. Vascular: No hyperdense vessel or unexpected calcification. Skull: Normal. Negative for fracture or focal lesion. Sinuses/Orbits: No middle ear or mastoid effusion. Partially imaged endotracheal and enteric tubes in place orbits are unremarkable. Other: None. ASPECTS [place] Stroke Program Early CT Score): 10 when accounting for chronic findings. IMPRESSION: 1. No hemorrhage or CT evidence of an acute cortical infarct. 2. Chronic infarct in the left frontal lobe. Findings were paged to Dr. Otelia Limes on 01/09/23 at 9:04 AM. Electronically Signed   By: Lorenza Cambridge M.D.   On: 01/09/2023 09:04   DG Chest Port 1 View  Addendum Date: 01/09/2023   ADDENDUM REPORT: 01/09/2023 05:00 ADDENDUM: Not mentioned above, there is no visible pneumothorax. Electronically Signed   By: Almira Bar M.D.   On: 01/09/2023 05:00   Result Date: 01/09/2023 CLINICAL DATA:  [id number] with acute respiratory failure, [id number] with pneumothorax. EXAM: PORTABLE CHEST 1 VIEW COMPARISON:  Portable chest 01/05/2023 FINDINGS: 4:21 a.m. NGT enters the stomach. The side port is in the proximal stomach and the tip would be in the body of stomach based on the position of the side port but is not in the field. ETT tip is 5.4 cm from the carina, right IJ line in the mid SVC at the azygous confluence. An electrical pad overlies the left chest base. There are extensive calcified pleural plaques. Minimal pleural effusions are unchanged. Bilateral infrahilar streaky atelectasis or infiltrates show interval improvement. The remaining lungs are clear of focal opacities. There is cardiomegaly with coronary artery stents and CABG changes. No evidence  of CHF. Stable mediastinum with aortic tortuosity and calcific plaque. No new osseous abnormality. IMPRESSION: 1. Interval improvement in bilateral infrahilar streaky atelectasis or infiltrates. 2. Stable minimal pleural effusions and cardiomegaly. 3. Stable support apparatus. 4. Extensive calcified pleural plaques. 5. Aortic atherosclerosis. Electronically Signed: By: Almira Bar M.D. On: 01/09/2023 04:45    Anti-infectives: Anti-infectives (From admission, onward)    Start     Dose/Rate Route Frequency Ordered Stop   12/26/22 1800  piperacillin-tazobactam (ZOSYN) IVPB 3.375 g        3.375 g 12.5 mL/hr over 240 Minutes Intravenous Every 8 hours 12/26/22 1615 01/01/23 2110   12/19/2022 2200  piperacillin-tazobactam (ZOSYN) IVPB 2.25 g  Status:  Discontinued        2.25 g 100 mL/hr over 30 Minutes Intravenous Every 8 hours 11/28/2022 2112 12/26/22 1615        Assessment/Plan POD 14, s/p right hemicolectomy with ileostomy secondary to Acute mesenteric ischemia by Dr. Azucena Cecil 11/1 just after midnight. Left in discontinuity with abthera. Also underwent revascularization with angioplasty of SMA by Dr. Lenell Antu.  -S/p washout, end ileostomy, JP drain placement and abdominal closure 11/2 - ileostomy productive, melena not unexpected given oropharyngeal bleeding noted overnight. - WBC up 16, febrile.   - CT relatively unremarkable for intra-abdominal infection or issues driving his illness. - bleeding from midline hemostatic after thrombin/pressure dressing, may resume normal  WD dressing changes. - continue JP - on plavix - cont to hold TFs today and cont TNA.  May try and resume in the next day or so pending patient's overall clinical course. - of epi, still on vaso, levo   ID - zosyn 10/31>>11/7 FEN - NPO, hold TF, TPN DVT - SCDs, Plavix Dispo - ICU     LOS: 16 days    Letha Cape, [facility] 12/28/2022, 8:59 AM Please see Amion for pager number during day hours  7:00am-4:30pm

## 2023-01-25 NOTE — Progress Notes (Addendum)
PCCM called to bedside for frank red bleeding in posterior pharynx. On arrival patient intubated on mech vent. On 17 mcg levo and 0.03 vaso. Audible cuff leak appreicated w/ bubbly frank red secretions in oropharynx. No specific source of bleeding appreciated in oropharynx. Bloody secretions suctioned out of ETT. Patient not currently on any anticoagulation but is on plavix.  Plan: -assume bleeding is related to suction trauma  -check DIC panel and CBC -transfuse for hgb <7 -hold plavix -cuff leak improved with adding air; check CXR to confirm ETT position  JD Daryel [facility] 12/29/2022, 3:13 AM  Please see [email or web address] for pager details.  From 7A-7P if no response, please call [phone]. After hours, please call ELink [phone].

## 2023-01-25 NOTE — Progress Notes (Signed)
[facility] Progress Note  Subjective: seen in ICU. No further neuro episodes. Weaned down to 2 pressors. I/O yest: net + 1.6 L Pressors: levo 16, vaso 0.04   Vitals:   12/26/2022 0515 01/12/2023 0530 01/22/2023 0545 01/24/2023 0600  BP:      Pulse: 81 83 80 82  Resp: (!) 31 (!) 31 (!) 31 (!) 29  Temp:      TempSrc:      SpO2: 100% 100% 100% 100%  Weight:      Height:        Exam: Gen: on vent, eyes open and close to voice CVS: Pulse regular rhythm, normal rate, S1 and S2 normal Resp: Transmitted breath sounds bilaterally without distinct rales or rhonchi Abd: s/p exploratory laparotomy with RLQ ostomy stoma.  Surgical dressings intact Ext: no LE edema, +L femoral temp HD cath; LUA AV fistula +bruit   Renal-related home meds: - plavix - renavite - velphoro 500 ac tid - norvasc 5 every day - others: statin, zetia, allopurinol, rapaflo      OP HD: TTS NW 4h   350/1.5    79.6kg   2/2 bath   AVF   Heparin none - last OP HD 10/31, post wt 79.5kg - getting to dry wt, good compliance w/ HD - rocaltrol 0.5 mcg  - mircera 30 mcg IV q 4 wks, last 10/19, due 11/16   CXR - chronic pleural calcifications, possibly basilar infiltrates   Assessment/ Plan Acute mesenteric ischemia with ischemic gut injury: Status post exploratory laparotomy with large bowel resection and ileostomy + SMA angioplasty.  Ongoing management per surgery and vascular surgery.  Acute hypoxic resp failure: vent-dependent.  ESRD: usual HD is TTS. CRRT started 11/1 in the setting of hemodynamic instability/critical illness. CRRT stopped 11/10, but then decompensated and went back on CRRT 11/12. Cont CRRT.  Volume: at dry wt today, looks euvolemic on exam. CXR not helpful (numerous chronic pleural plauqes).  Hypotension: levo and vaso gtt's, + ng midodrine 15 tid Anemia esrd: Hb 8s today. Crista Curb to 60 mcg weekly.  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 Cont IV vdra Nutrition: On TPN  HD discharge issues:  If pt needs to dc with trach in place it is highly likely that he will not be accepted by any of the local dialysis units.    Vinson Moselle MD  [facility] 01/15/2023, 6:25 AM  Recent Labs  Lab 01/09/23 0806 01/09/23 1615 01/05/2023 0314 01/16/2023 0315  HGB 9.2*  --  7.9*  --   ALBUMIN  --  2.1* 1.9* 1.9*  CALCIUM  --  8.4*  --  8.9  PHOS  --  3.1  --  2.7  CREATININE  --  1.74*  --  1.06  K 4.7 4.0  --  4.0   No results for input(s): "IRON", "TIBC", "FERRITIN" in the last 168 hours. Inpatient medications:  arformoterol  15 mcg Nebulization BID   aspirin  81 mg Per Tube Daily   Chlorhexidine Gluconate Cloth  6 each Topical Daily   darbepoetin (ARANESP) injection - DIALYSIS  40 mcg Subcutaneous Q Mon-1800   doxercalciferol  4 mcg Intravenous Q M,W,F   feeding supplement (PROSource TF20)  60 mL Per Tube QID   gabapentin  300 mg Per Tube QHS   insulin aspart  0-15 Units Subcutaneous Q4H   latanoprost  1 drop Both Eyes QHS   metoCLOPramide (REGLAN) injection  5 mg Intravenous Q8H   midodrine  15 mg Per  Tube Q8H   mouth rinse  15 mL Mouth Rinse Q2H   revefenacin  175 mcg Nebulization Daily   rosuvastatin  10 mg Per Tube Daily   sodium chloride flush  10-40 mL Intracatheter Q12H     prismasol BGK 4/2.5 400 mL/hr at 01/09/23 1845    prismasol BGK 4/2.5 400 mL/hr at 12/31/2022 0120   epinephrine Stopped (01/09/23 1442)   famotidine (PEPCID) IV Stopped (01/11/2023 0232)   norepinephrine (LEVOPHED) Adult infusion 16 mcg/min (12/26/2022 0600)   prismasol BGK 4/2.5 1,500 mL/hr at 12/30/2022 0440   TPN (CLINIMIX) Adult with Electrolyte Additives 82 mL/hr at 01/18/2023 0600   vasopressin 0.03 Units/min (01/02/2023 0600)   fentaNYL (SUBLIMAZE) injection, heparin, mouth rinse, silver nitrate applicators, sodium chloride flush, white petrolatum

## 2023-01-25 NOTE — Progress Notes (Signed)
eLink Physician-Brief Progress Note Patient Name: Peter Oneill DOB: [birth year] MRN: [id number]   Date of Service  01/17/2023  HPI/Events of Note  Patient appears to be bleeding into the back of his throat. Recent PT-INR and PTT not significantly elevated.  eICU Interventions  Will re-check coagulation parameters and have ground crew come and take a look at bedside.        Thomasene Lot Ellah Otte 01/15/2023, 2:38 AM

## 2023-01-25 NOTE — Progress Notes (Signed)
NAME:  Peter Oneill, MRN:  [id number], DOB:  [birth year], LOS: 16 ADMISSION DATE:  12/10/2022, CONSULTATION DATE:  12/22/2022 REFERRING MD:  EDP, CHIEF COMPLAINT:  Abdominal Pain   History of Present Illness:  82 year old man with PMHx significant for ESRD, Asbestosis, CAD s/p CABG, HTN, HL, OSA and emphysema followed by Dr. [name] who presented to the ED with acutely worsening abdominal pain that began the day of admission.  He underwent dialysis in the morning and then felt short of breath followed by abdominal discomfort.  His initial oxygen saturation was 70% on EMS arrival and his work-up in the ED included a CT Abd/pelvis concerning for acute mesenteric ischemia secondary to occlusion of the celiac artery and significant stenosis of the superior mesenteric artery and small inferior mesenteric artery.  His labs showed a lactic acid of 1.4, WBC 6.3, creatinine 3.7, BNP >1300, normal Hgb.  He was taken emergently to the OR for urgent revascularization and SMA stenting along with ex-lap.  PCCM asked to admit  Pertinent Medical History:   Past Medical History:  Diagnosis Date   Asbestosis (HCC)    Contraindication to percutaneous coronary intervention (PCI)    Coronary artery disease    Dyslipidemia    Dyspnea    ESRD on hemodialysis (HCC)    History of anemia    Hypercholesterolemia    Hypertension    Ileus (HCC)    Pleural effusion on left    Pneumonia    Sleep apnea    Significant Hospital Events: Including procedures, antibiotic start and stop dates in addition to other pertinent events   10/31 Presented with mesenteric ischemia, occlusion of the celiac and significant stenosis of the SMA and small inferior mesenteric artery. Went to OR for urgent revascularization and SMA stenting along with ex-lap. Right hemicolectomy for necrotic cecum. Significant pressor requirement and bleeding (800 cc 1st hour) from abdominal wound vac post-op, given 4L PRBC's, 500cc albumin, 500cc NS and  was on Levophed , Neo , Vaso 0.04 and Epi . Pressors were titrated down with resuscitation.  11/1. CVL placed for pressors. LE warm . Weaning pressors. Orthostatic w/ positional change. 2.2 liters positive. Changing prop to dex. Drain output decreased  11/2 Back to OR for washout, end ileostomy, and closure 11/4 Pressors weaning. SBT  11/6 starting nepro  11/7 increasing TF from 20 to 40, hoping to pull some volume on CRRT prior to considering extubation. Completed zosyn  11/8 cuff leak, adv ETT 1cm. Remains on pressors and CRRT. Adding enteral pain meds to try to reduce continuous sedation burden   11/9 CO2 retention, backed off on opiates 11/10 improved mental status 11/11 on BiPAP 11/11-code stroke called-MRI negative for acute stroke 11/15-decreased alertness, pupils about 4 mm unreactive, code stroke called 11/16 family conference. They do not want prolonged trach/vent   Interim History / Subjective:  Still minimally responsive   Objective:  Blood pressure (!) 125/40, pulse 75, temperature 98.1 F (36.7 C), temperature source Axillary, resp. rate (!) 30, height 5\' 9"  (1.753 m), weight 79.3 kg, SpO2 100%. CVP:  [17 mmHg-42 mmHg] 17 mmHg  Vent Mode: PRVC FiO2 (%):  [40 %-50 %] 40 % Set Rate:  [30 bmp] 30 bmp Vt Set:  [560 mL] 560 mL PEEP:  [5 cmH20] 5 cmH20 Plateau Pressure:  [19 cmH20-28 cmH20] 28 cmH20   Intake/Output Summary (Last 24 hours) at 01/18/2023 0957 Last data filed at 01/05/2023 0900 Gross per 24 hour  Intake 3704.81 ml  Output 3687.9 ml  Net 16.91 ml   Filed Weights   01/08/23 0500 01/09/23 0500 01/16/2023 0500  Weight: 82 kg 82.3 kg 79.3 kg   Physical Examination: General this is a 82 year old male who is minimally responsive on vent HENT NCAT no JVD orally intubated Pulm clear equal chest rise  Card rrr Abd soft mid abd dressing intact. The ileostomy is patent RLQ JP draining older blood  Ext warm  Neuro awake not interactive. Not  following commands.  Gu anuric  Assessment & Plan:   Acute hypoxemic respiratory failure w/ on-going ventilator dependence after prolonged critical illness superimposed on underlying asbestosis and emphysema  Deconditioning and underlying obstructive lung disease major barrier to progress plan Cont full vent support  VAP bundle  PAD protocol  Am CXR  Family not interested in prolonged ICU course or long term vent/trach  Discussing time-line/GOC for 1-way extubation   Ongoing acute metabolic encephalopathy -Code stroke was called -CT head, CTA negative for any evidence of acute stroke Plan Supportive care   Acute mesenteric ischemia/ischemic bowel w/ on-going circulatory shock (favor sepsis) -S/p exploratory laparotomy x 2, resection of large bowel with ileostomy and angioplasty of SMA occlusion. Completed course of Zosyn, wbc still up , fever difficult to predict w/ CRRT. CT abd neg for clear infection.  Plan TPN as directed by surgical team  NPO fpr today  Send PCT  Consider resuming abx   Elevated LFTs Plan Cont to monitor   Acute blood loss anemia Melena noted and not unexpected given oropharyngeal bleeding. Hgb trending down  Plan Cont to monitor Hold ac Transfuse for hgb <7 or if has active bleeding  End-stage renal disease was on hemodialysis NAGMA Plan Cont CRRT per nephro  F/u am labs   Paroxysmal atrial fibrillation Rate is controlled Plan Rate control  Anticoagulation held at present for bleeding    Best Practice: (right click and "Reselect all SmartList Selections" daily)   Diet/type: Continue TPN,  DVT prophylaxis: SCD GI prophylaxis: H2B Lines: Central line and Dialysis Catheter Foley:  N/A Code Status:  full code Last date of multidisciplinary goals of care discussion: 11/14  My time 45 min   Simonne Martinet ACNP-BC [facility] Pulmonary/Critical Care Pager # [phone] OR # [phone] if no answer

## 2023-01-25 NOTE — Progress Notes (Signed)
   12/26/2022 1400  Spiritual Encounters  Type of Visit Initial  Care provided to: Pt and family  Conversation partners present during encounter Nurse  Referral source Physician;Clinical staff  Reason for visit End-of-life  OnCall Visit No  Spiritual Framework  Presenting Themes Meaning/purpose/sources of inspiration;Courage hope and growth;Rituals and practive  Values/beliefs [employer] Catholic  Community/Connection Family;Faith community;Friend(s)  Patient Stress Factors None identified  Family Stress Factors Loss;Loss of control;Major life changes  Interventions  Spiritual Care Interventions Made Established relationship of care and support;Meaning making;Bereavement/grief support  Intervention Outcomes  Outcomes Connection to spiritual care;Reduced fear;Awareness of support   Chaplain was called to bedside following request for Catholic Pirest to visit.  Catholic  Preist was not available so Chaplain attended to patient and family. Provided spiritual care and comfort to family - Members of 9 individuals were surrounding the bedside of Aldor and they each spoke words of love and support to [place].  Group prayer and anointing of oil and The Lords Prayer.  Provided encouragement and support to staff as well.    Respectfully Submitted,  Rev. Amaryllis Dyke

## 2023-01-25 NOTE — Progress Notes (Signed)
Post mortem care completed and no belongings at bedside. Pt ready for transport to morgue

## 2023-01-25 DEATH — deceased

## 2023-01-27 ENCOUNTER — Encounter (HOSPITAL_COMMUNITY): Payer: Medicare HMO

## 2023-01-27 ENCOUNTER — Encounter: Payer: Medicare HMO | Admitting: Vascular Surgery

## 2023-02-02 IMAGING — DX DG CHEST 2V
2 series · 2 of 2 positions shown · non-contrast
Comparison: Previous studies including the examination of
04/10/2020

CLINICAL DATA: Shortness of breath

EXAM:
CHEST - 2 VIEW

[chest lat]
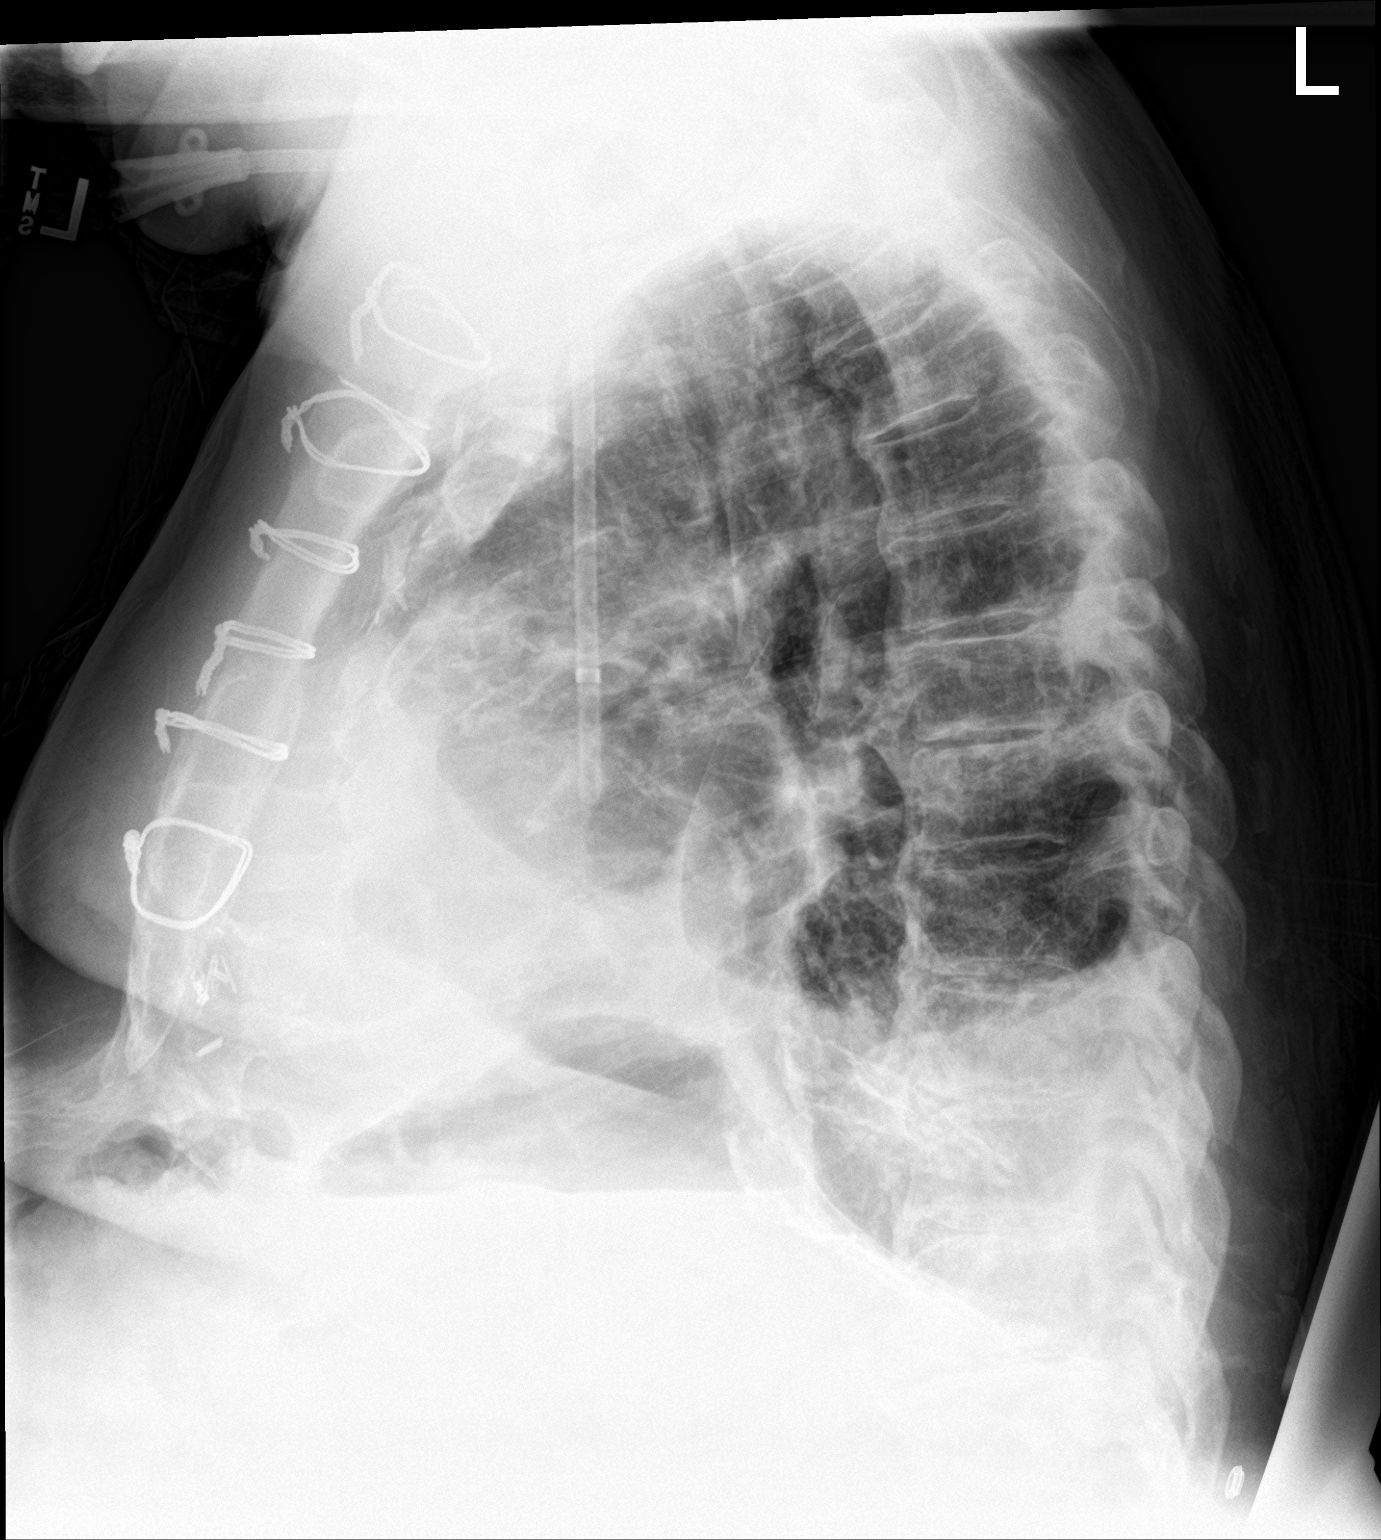

[chest ap]
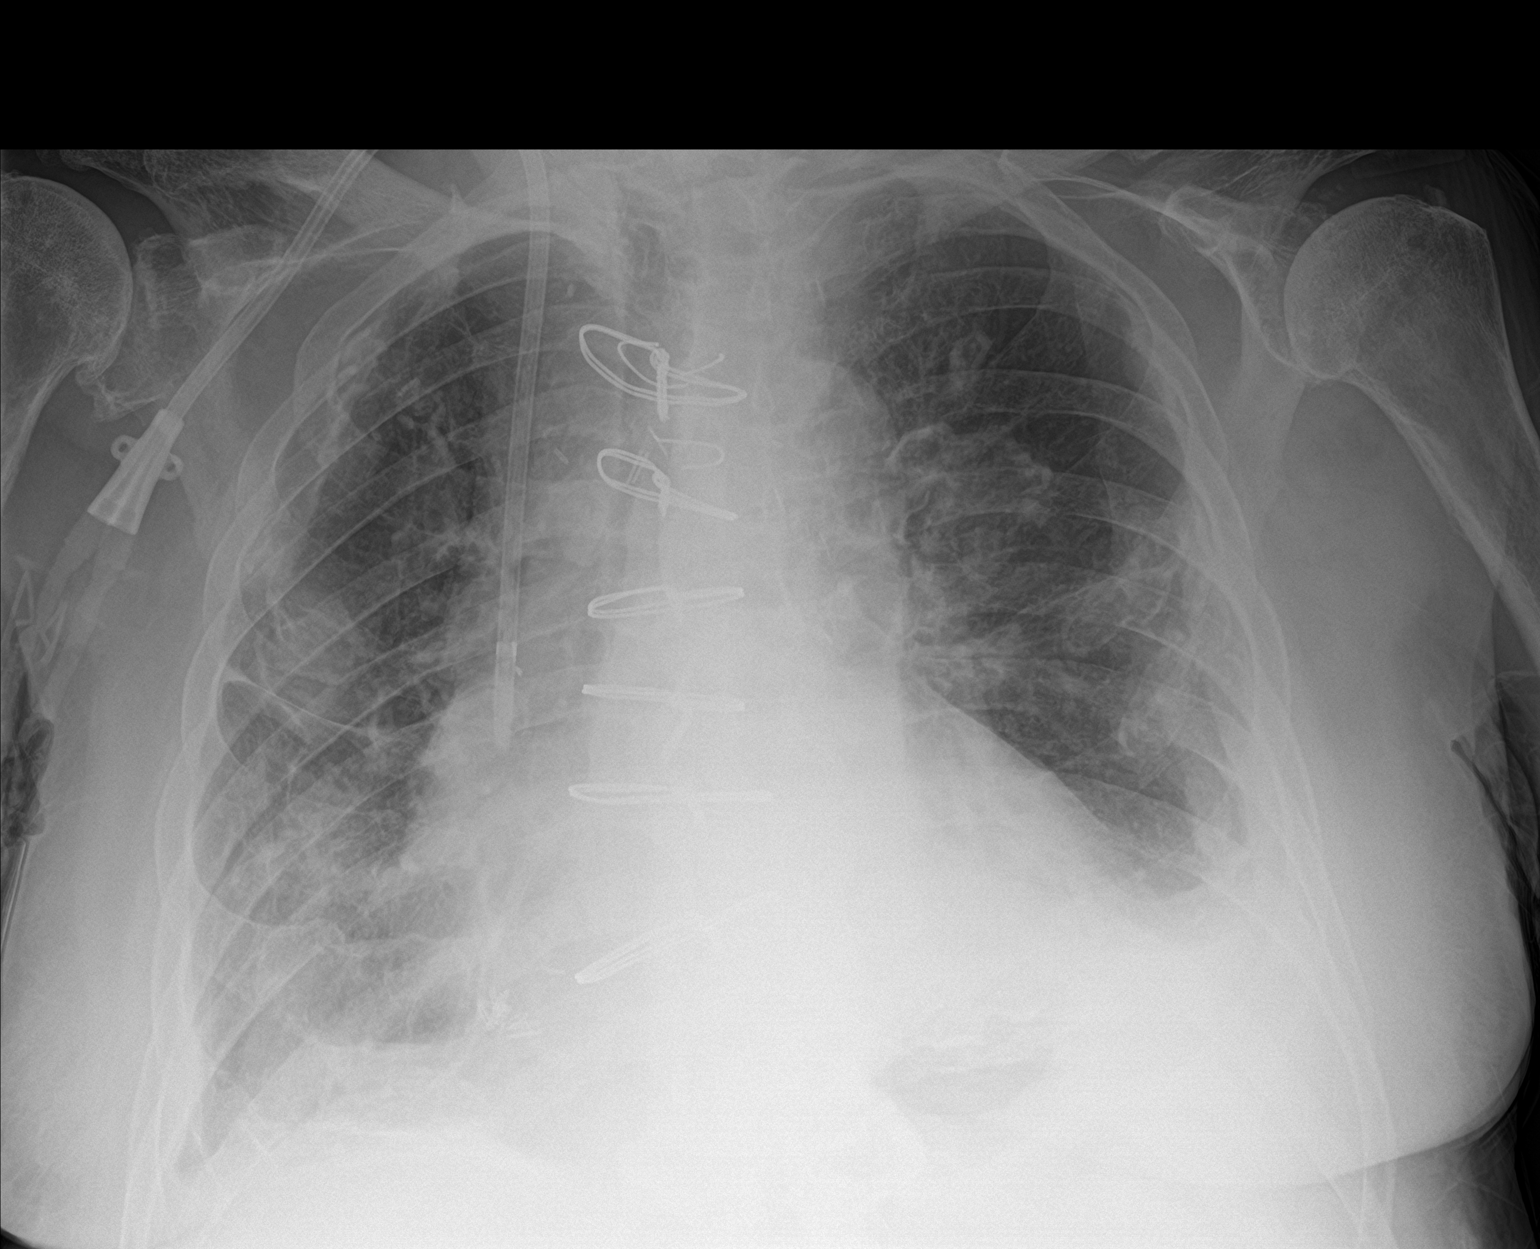

[2 of 2 positions shown; findings below may reference images not displayed]

FINDINGS: Transverse diameter of heart is increased. Central pulmonary vessels
are prominent. Are scattered pleural calcifications. There are
patchy infiltrates in right parahilar region and both lower lung
fields. There is blunting CP lateral CP angles. There is no
pneumothorax. Tip right IJ dialysis catheter is seen in the superior
vena cava. Metallic sutures seen in the sternum.
IMPRESSION: There are patchy infiltrates in the right mid and both lower lung
fields suggesting atelectasis/pneumonia. Small bilateral pleural
effusions, more so on the left side. There scattered linear
densities and pleural calcifications in both lungs suggesting
scarring.

## 2023-02-05 IMAGING — DX DG ABDOMEN 1V
1 series · 1 of 1 positions shown · non-contrast
Comparison: None.

CLINICAL DATA: Ileus.

EXAM:
ABDOMEN - 1 VIEW

[abdomen supine]
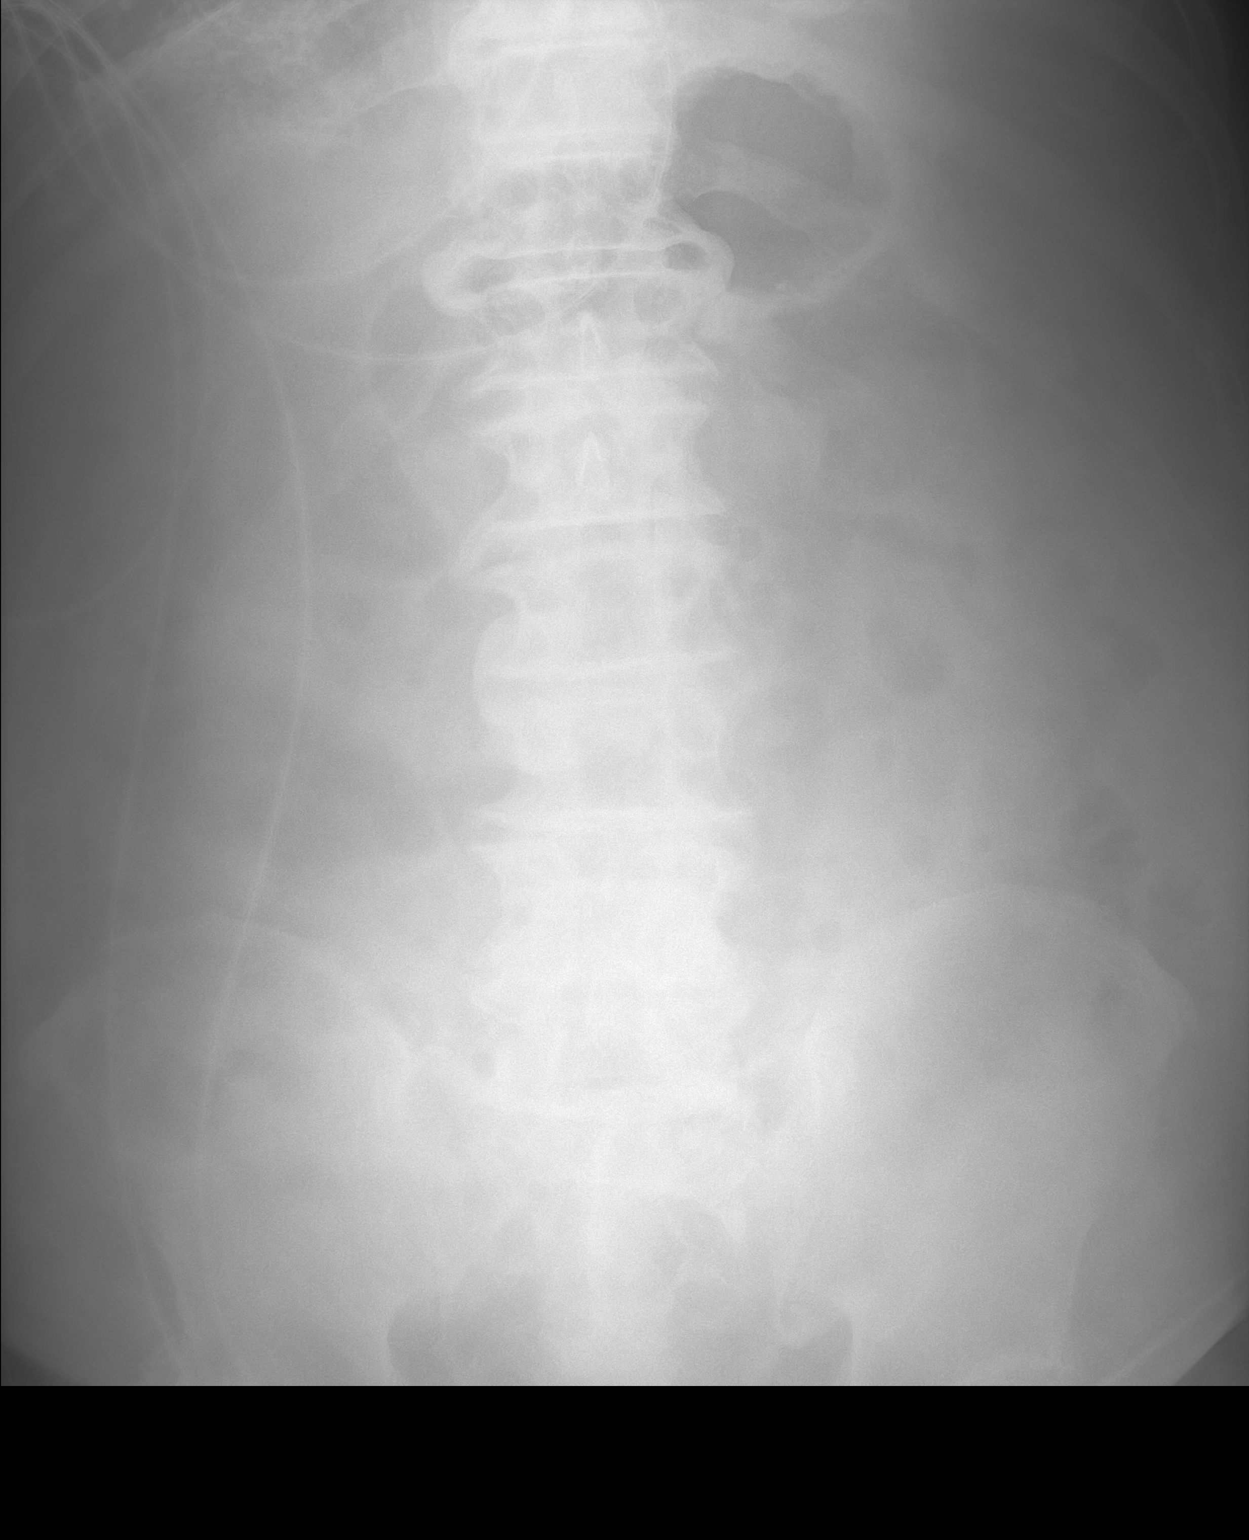

[1 of 1 positions shown; findings below may reference images not displayed]

FINDINGS: The bowel gas pattern is normal. No radio-opaque calculi or other
significant radiographic abnormality are seen.
IMPRESSION: Negative.

## 2023-03-03 MED FILL — Medication: Qty: 1 | Status: AC
